# Patient Record
Sex: Male | Born: 1935
Health system: Southern US, Community
[De-identification: ages and names within clinical notes are randomized; demographics above are authoritative.]

## PROBLEM LIST (undated history)

## (undated) DIAGNOSIS — E669 Obesity, unspecified: Secondary | ICD-10-CM

## (undated) DIAGNOSIS — H9190 Unspecified hearing loss, unspecified ear: Secondary | ICD-10-CM

## (undated) DIAGNOSIS — F418 Other specified anxiety disorders: Secondary | ICD-10-CM

## (undated) DIAGNOSIS — R001 Bradycardia, unspecified: Secondary | ICD-10-CM

## (undated) DIAGNOSIS — F32A Depression, unspecified: Secondary | ICD-10-CM

## (undated) DIAGNOSIS — R079 Chest pain, unspecified: Secondary | ICD-10-CM

## (undated) DIAGNOSIS — K219 Gastro-esophageal reflux disease without esophagitis: Secondary | ICD-10-CM

## (undated) DIAGNOSIS — J302 Other seasonal allergic rhinitis: Secondary | ICD-10-CM

## (undated) DIAGNOSIS — I5030 Unspecified diastolic (congestive) heart failure: Secondary | ICD-10-CM

## (undated) DIAGNOSIS — J45909 Unspecified asthma, uncomplicated: Secondary | ICD-10-CM

## (undated) DIAGNOSIS — H409 Unspecified glaucoma: Secondary | ICD-10-CM

## (undated) DIAGNOSIS — K635 Polyp of colon: Secondary | ICD-10-CM

## (undated) DIAGNOSIS — E785 Hyperlipidemia, unspecified: Secondary | ICD-10-CM

## (undated) DIAGNOSIS — F959 Tic disorder, unspecified: Secondary | ICD-10-CM

## (undated) DIAGNOSIS — I351 Nonrheumatic aortic (valve) insufficiency: Secondary | ICD-10-CM

## (undated) DIAGNOSIS — F419 Anxiety disorder, unspecified: Secondary | ICD-10-CM

## (undated) DIAGNOSIS — E7800 Pure hypercholesterolemia, unspecified: Secondary | ICD-10-CM

## (undated) DIAGNOSIS — F329 Major depressive disorder, single episode, unspecified: Secondary | ICD-10-CM

## (undated) DIAGNOSIS — F5105 Insomnia due to other mental disorder: Secondary | ICD-10-CM

## (undated) DIAGNOSIS — I519 Heart disease, unspecified: Secondary | ICD-10-CM

## (undated) HISTORY — DX: Chest pain, unspecified: R07.9

## (undated) HISTORY — DX: Heart disease, unspecified: I51.9

## (undated) HISTORY — PX: CATARACT EXTRACTION, BILATERAL: SHX1313

## (undated) HISTORY — DX: Unspecified asthma, uncomplicated: J45.909

## (undated) HISTORY — DX: Unspecified hearing loss, unspecified ear: H91.90

## (undated) HISTORY — DX: Bradycardia, unspecified: R00.1

## (undated) HISTORY — DX: Tic disorder, unspecified: F95.9

## (undated) HISTORY — DX: Unspecified diastolic (congestive) heart failure: I50.30

## (undated) HISTORY — DX: Depression, unspecified: F32.A

## (undated) HISTORY — DX: Unspecified glaucoma: H40.9

## (undated) HISTORY — PX: COLONOSCOPY: SHX174

## (undated) HISTORY — DX: Gastro-esophageal reflux disease without esophagitis: K21.9

## (undated) HISTORY — DX: Insomnia due to other mental disorder: F51.05

## (undated) HISTORY — DX: Obesity, unspecified: E66.9

## (undated) HISTORY — DX: Other specified anxiety disorders: F41.8

## (undated) HISTORY — DX: Nonrheumatic aortic (valve) insufficiency: I35.1

## (undated) HISTORY — PX: EYE SURGERY: SHX253

## (undated) HISTORY — DX: Hyperlipidemia, unspecified: E78.5

## (undated) HISTORY — DX: Pure hypercholesterolemia, unspecified: E78.00

## (undated) HISTORY — DX: Major depressive disorder, single episode, unspecified: F32.9

---

## 2001-10-21 ENCOUNTER — Encounter: Payer: Self-pay | Admitting: Family Medicine

## 2001-10-21 ENCOUNTER — Ambulatory Visit (HOSPITAL_COMMUNITY): Admission: RE | Admit: 2001-10-21 | Discharge: 2001-10-21 | Payer: Self-pay | Admitting: Family Medicine

## 2001-10-27 ENCOUNTER — Ambulatory Visit (HOSPITAL_COMMUNITY): Admission: RE | Admit: 2001-10-27 | Discharge: 2001-10-27 | Payer: Self-pay | Admitting: Family Medicine

## 2001-10-27 ENCOUNTER — Encounter: Payer: Self-pay | Admitting: Family Medicine

## 2003-06-02 ENCOUNTER — Ambulatory Visit (HOSPITAL_COMMUNITY): Admission: RE | Admit: 2003-06-02 | Discharge: 2003-06-02 | Payer: Self-pay | Admitting: Family Medicine

## 2004-02-21 ENCOUNTER — Ambulatory Visit: Payer: Self-pay | Admitting: Family Medicine

## 2004-03-01 ENCOUNTER — Ambulatory Visit: Payer: Self-pay | Admitting: Family Medicine

## 2004-03-06 ENCOUNTER — Ambulatory Visit (HOSPITAL_COMMUNITY): Admission: RE | Admit: 2004-03-06 | Discharge: 2004-03-06 | Payer: Self-pay | Admitting: Family Medicine

## 2004-04-09 HISTORY — PX: OTHER SURGICAL HISTORY: SHX169

## 2004-06-01 ENCOUNTER — Ambulatory Visit (HOSPITAL_BASED_OUTPATIENT_CLINIC_OR_DEPARTMENT_OTHER): Admission: RE | Admit: 2004-06-01 | Discharge: 2004-06-01 | Payer: Self-pay | Admitting: Orthopedic Surgery

## 2004-06-01 ENCOUNTER — Ambulatory Visit (HOSPITAL_COMMUNITY): Admission: RE | Admit: 2004-06-01 | Discharge: 2004-06-01 | Payer: Self-pay | Admitting: Orthopedic Surgery

## 2004-06-29 ENCOUNTER — Ambulatory Visit: Payer: Self-pay | Admitting: Family Medicine

## 2004-07-05 ENCOUNTER — Ambulatory Visit (HOSPITAL_COMMUNITY): Admission: RE | Admit: 2004-07-05 | Discharge: 2004-07-05 | Payer: Self-pay | Admitting: Family Medicine

## 2004-10-30 ENCOUNTER — Ambulatory Visit: Payer: Self-pay | Admitting: Family Medicine

## 2004-12-29 ENCOUNTER — Ambulatory Visit: Payer: Self-pay | Admitting: Family Medicine

## 2005-02-19 ENCOUNTER — Ambulatory Visit: Payer: Self-pay | Admitting: Family Medicine

## 2005-03-06 ENCOUNTER — Ambulatory Visit: Payer: Self-pay | Admitting: Family Medicine

## 2005-05-09 ENCOUNTER — Ambulatory Visit: Payer: Self-pay | Admitting: Family Medicine

## 2005-08-22 ENCOUNTER — Ambulatory Visit: Payer: Self-pay | Admitting: Family Medicine

## 2005-09-18 ENCOUNTER — Ambulatory Visit (HOSPITAL_COMMUNITY): Admission: RE | Admit: 2005-09-18 | Discharge: 2005-09-18 | Payer: Self-pay | Admitting: Internal Medicine

## 2005-09-18 ENCOUNTER — Ambulatory Visit: Payer: Self-pay | Admitting: Internal Medicine

## 2005-11-29 ENCOUNTER — Ambulatory Visit (HOSPITAL_COMMUNITY): Admission: RE | Admit: 2005-11-29 | Discharge: 2005-11-29 | Payer: Self-pay | Admitting: Family Medicine

## 2006-01-07 ENCOUNTER — Ambulatory Visit: Payer: Self-pay | Admitting: Family Medicine

## 2006-01-08 ENCOUNTER — Ambulatory Visit: Payer: Self-pay | Admitting: Family Medicine

## 2006-02-26 ENCOUNTER — Ambulatory Visit: Payer: Self-pay | Admitting: Family Medicine

## 2006-05-09 ENCOUNTER — Encounter: Payer: Self-pay | Admitting: Family Medicine

## 2006-05-09 LAB — CONVERTED CEMR LAB
ALT: 18 units/L (ref 0–53)
AST: 20 units/L (ref 0–37)
Albumin: 4.3 g/dL (ref 3.5–5.2)
Cholesterol: 139 mg/dL (ref 0–200)
HDL: 40 mg/dL (ref >39)
Total CHOL/HDL Ratio: 3.5
Total Protein: 7.1 g/dL (ref 6.0–8.3)
Triglycerides: 68 mg/dL (ref <150)
VLDL: 14 mg/dL (ref 0–40)

## 2006-05-13 ENCOUNTER — Ambulatory Visit: Payer: Self-pay | Admitting: Family Medicine

## 2006-09-03 ENCOUNTER — Encounter: Payer: Self-pay | Admitting: Family Medicine

## 2006-09-03 LAB — CONVERTED CEMR LAB
AST: 20 units/L (ref 0–37)
Albumin: 4.7 g/dL (ref 3.5–5.2)
Cholesterol: 152 mg/dL (ref 0–200)
HDL: 42 mg/dL (ref >39)
LDL Cholesterol: 92 mg/dL (ref 0–99)
Total Bilirubin: 0.9 mg/dL (ref 0.3–1.2)
Total CHOL/HDL Ratio: 3.6
VLDL: 18 mg/dL (ref 0–40)

## 2006-09-11 ENCOUNTER — Ambulatory Visit: Payer: Self-pay | Admitting: Family Medicine

## 2006-12-30 ENCOUNTER — Encounter: Payer: Self-pay | Admitting: Family Medicine

## 2006-12-30 LAB — CONVERTED CEMR LAB
Bilirubin, Direct: 0.2 mg/dL (ref 0.0–0.3)
CO2: 23 meq/L (ref 19–32)
Calcium: 9.3 mg/dL (ref 8.4–10.5)
Eosinophils Relative: 4 % (ref 0–5)
Glucose, Bld: 105 mg/dL — ABNORMAL HIGH (ref 70–99)
HCT: 40.9 % (ref 39.0–52.0)
LDL Cholesterol: 75 mg/dL (ref 0–99)
Lymphocytes Relative: 37 % (ref 12–46)
Lymphs Abs: 1.9 10*3/uL (ref 0.7–3.3)
PSA: 0.1 ng/mL (ref 0.10–4.00)
Platelets: 276 10*3/uL (ref 150–400)
Potassium: 4.1 meq/L (ref 3.5–5.3)
Sodium: 143 meq/L (ref 135–145)
Total Bilirubin: 0.8 mg/dL (ref 0.3–1.2)
Total CHOL/HDL Ratio: 3.2
VLDL: 14 mg/dL (ref 0–40)
WBC: 5.3 10*3/uL (ref 4.0–10.5)

## 2007-01-06 ENCOUNTER — Ambulatory Visit: Payer: Self-pay | Admitting: Family Medicine

## 2007-01-17 ENCOUNTER — Ambulatory Visit: Payer: Self-pay | Admitting: Internal Medicine

## 2007-01-20 ENCOUNTER — Ambulatory Visit (HOSPITAL_COMMUNITY): Admission: RE | Admit: 2007-01-20 | Discharge: 2007-01-20 | Payer: Self-pay | Admitting: Internal Medicine

## 2007-01-22 ENCOUNTER — Ambulatory Visit: Payer: Self-pay | Admitting: Cardiology

## 2007-05-07 ENCOUNTER — Encounter: Payer: Self-pay | Admitting: Family Medicine

## 2007-05-07 LAB — CONVERTED CEMR LAB
Albumin: 4.3 g/dL (ref 3.5–5.2)
CO2: 25 meq/L (ref 19–32)
Chloride: 108 meq/L (ref 96–112)
HDL: 43 mg/dL (ref >39)
LDL Cholesterol: 75 mg/dL (ref 0–99)
Potassium: 4 meq/L (ref 3.5–5.3)
Sodium: 142 meq/L (ref 135–145)
Total Bilirubin: 0.8 mg/dL (ref 0.3–1.2)
Total CHOL/HDL Ratio: 3

## 2007-05-08 ENCOUNTER — Ambulatory Visit: Payer: Self-pay | Admitting: Family Medicine

## 2007-09-02 ENCOUNTER — Encounter: Payer: Self-pay | Admitting: Family Medicine

## 2007-09-02 LAB — CONVERTED CEMR LAB
AST: 23 units/L (ref 0–37)
Albumin: 4.2 g/dL (ref 3.5–5.2)
Bilirubin, Direct: 0.1 mg/dL (ref 0.0–0.3)
CO2: 24 meq/L (ref 19–32)
Calcium: 9.1 mg/dL (ref 8.4–10.5)
Chloride: 109 meq/L (ref 96–112)
Eosinophils Relative: 5 % (ref 0–5)
Glucose, Bld: 99 mg/dL (ref 70–99)
HCT: 41.6 % (ref 39.0–52.0)
Hemoglobin: 13 g/dL (ref 13.0–17.0)
LDL Cholesterol: 113 mg/dL — ABNORMAL HIGH (ref 0–99)
Lymphocytes Relative: 38 % (ref 12–46)
Lymphs Abs: 2.2 10*3/uL (ref 0.7–4.0)
Monocytes Absolute: 0.5 10*3/uL (ref 0.1–1.0)
Neutro Abs: 2.7 10*3/uL (ref 1.7–7.7)
Platelets: 283 10*3/uL (ref 150–400)
Sodium: 145 meq/L (ref 135–145)
TSH: 1.438 microintl units/mL (ref 0.350–5.50)
Total Bilirubin: 0.6 mg/dL (ref 0.3–1.2)
Total CHOL/HDL Ratio: 3.5
VLDL: 14 mg/dL (ref 0–40)
WBC: 5.7 10*3/uL (ref 4.0–10.5)

## 2007-09-08 DIAGNOSIS — E785 Hyperlipidemia, unspecified: Secondary | ICD-10-CM

## 2007-09-08 DIAGNOSIS — E669 Obesity, unspecified: Secondary | ICD-10-CM

## 2007-09-08 DIAGNOSIS — I498 Other specified cardiac arrhythmias: Secondary | ICD-10-CM

## 2007-09-10 ENCOUNTER — Ambulatory Visit: Payer: Self-pay | Admitting: Family Medicine

## 2007-12-08 ENCOUNTER — Encounter: Payer: Self-pay | Admitting: Family Medicine

## 2007-12-08 LAB — CONVERTED CEMR LAB
Albumin: 4.3 g/dL (ref 3.5–5.2)
CO2: 23 meq/L (ref 19–32)
Chloride: 106 meq/L (ref 96–112)
HDL: 45 mg/dL (ref >39)
LDL Cholesterol: 82 mg/dL (ref 0–99)
PSA: 0.1 ng/mL (ref 0.10–4.00)
Potassium: 4.4 meq/L (ref 3.5–5.3)
Sodium: 142 meq/L (ref 135–145)
Total Bilirubin: 0.7 mg/dL (ref 0.3–1.2)
Total CHOL/HDL Ratio: 3.1
Total Protein: 7.5 g/dL (ref 6.0–8.3)
Triglycerides: 57 mg/dL (ref <150)
VLDL: 11 mg/dL (ref 0–40)

## 2007-12-23 ENCOUNTER — Ambulatory Visit: Payer: Self-pay | Admitting: Family Medicine

## 2007-12-23 DIAGNOSIS — M899 Disorder of bone, unspecified: Secondary | ICD-10-CM | POA: Insufficient documentation

## 2007-12-23 DIAGNOSIS — H9190 Unspecified hearing loss, unspecified ear: Secondary | ICD-10-CM | POA: Insufficient documentation

## 2007-12-23 DIAGNOSIS — B353 Tinea pedis: Secondary | ICD-10-CM

## 2007-12-23 DIAGNOSIS — M949 Disorder of cartilage, unspecified: Secondary | ICD-10-CM

## 2007-12-26 ENCOUNTER — Encounter: Payer: Self-pay | Admitting: Family Medicine

## 2007-12-26 ENCOUNTER — Ambulatory Visit (HOSPITAL_COMMUNITY): Admission: RE | Admit: 2007-12-26 | Discharge: 2007-12-26 | Payer: Self-pay | Admitting: Family Medicine

## 2008-01-01 ENCOUNTER — Ambulatory Visit: Payer: Self-pay | Admitting: Cardiology

## 2008-01-05 ENCOUNTER — Encounter: Payer: Self-pay | Admitting: Family Medicine

## 2008-01-27 ENCOUNTER — Encounter: Payer: Self-pay | Admitting: Family Medicine

## 2008-03-11 ENCOUNTER — Encounter: Payer: Self-pay | Admitting: Family Medicine

## 2008-03-11 ENCOUNTER — Encounter (INDEPENDENT_AMBULATORY_CARE_PROVIDER_SITE_OTHER): Payer: Self-pay

## 2008-03-31 ENCOUNTER — Encounter: Payer: Self-pay | Admitting: Family Medicine

## 2008-03-31 LAB — CONVERTED CEMR LAB
ALT: 21 units/L (ref 0–53)
Alkaline Phosphatase: 50 units/L (ref 39–117)
Bilirubin, Direct: 0.2 mg/dL (ref 0.0–0.3)
Cholesterol: 142 mg/dL (ref 0–200)
Indirect Bilirubin: 0.6 mg/dL (ref 0.0–0.9)
Total Protein: 7.2 g/dL (ref 6.0–8.3)
Triglycerides: 46 mg/dL (ref <150)
VLDL: 9 mg/dL (ref 0–40)

## 2008-04-16 ENCOUNTER — Ambulatory Visit: Payer: Self-pay | Admitting: Family Medicine

## 2008-08-25 LAB — CONVERTED CEMR LAB
BUN: 11 mg/dL (ref 6–23)
Bilirubin, Direct: 0.2 mg/dL (ref 0.0–0.3)
CO2: 25 meq/L (ref 19–32)
Calcium: 9.4 mg/dL (ref 8.4–10.5)
Cholesterol: 130 mg/dL (ref 0–200)
Glucose, Bld: 105 mg/dL — ABNORMAL HIGH (ref 70–99)
Indirect Bilirubin: 0.5 mg/dL (ref 0.0–0.9)
Potassium: 4.3 meq/L (ref 3.5–5.3)
Sodium: 143 meq/L (ref 135–145)
Total Bilirubin: 0.7 mg/dL (ref 0.3–1.2)
Total CHOL/HDL Ratio: 3
VLDL: 9 mg/dL (ref 0–40)

## 2008-09-03 ENCOUNTER — Ambulatory Visit: Payer: Self-pay | Admitting: Family Medicine

## 2008-10-28 ENCOUNTER — Telehealth: Payer: Self-pay | Admitting: Family Medicine

## 2008-11-01 LAB — CONVERTED CEMR LAB
Basophils Relative: 2 % — ABNORMAL HIGH (ref 0–1)
Eosinophils Absolute: 0.2 10*3/uL (ref 0.0–0.7)
Eosinophils Relative: 4 % (ref 0–5)
HCT: 41.9 % (ref 39.0–52.0)
Lymphs Abs: 2 10*3/uL (ref 0.7–4.0)
MCHC: 31.3 g/dL (ref 30.0–36.0)
MCV: 93.7 fL (ref 78.0–100.0)
Monocytes Absolute: 0.5 10*3/uL (ref 0.1–1.0)
Monocytes Relative: 8 % (ref 3–12)
RBC: 4.47 M/uL (ref 4.22–5.81)
Vit D, 25-Hydroxy: 31 ng/mL (ref 30–89)
WBC: 5.5 10*3/uL (ref 4.0–10.5)

## 2008-11-04 ENCOUNTER — Telehealth: Payer: Self-pay | Admitting: Family Medicine

## 2008-11-29 ENCOUNTER — Ambulatory Visit: Payer: Self-pay | Admitting: Family Medicine

## 2009-01-04 ENCOUNTER — Ambulatory Visit: Payer: Self-pay | Admitting: Family Medicine

## 2009-01-26 ENCOUNTER — Encounter: Payer: Self-pay | Admitting: Family Medicine

## 2009-02-07 ENCOUNTER — Encounter: Payer: Self-pay | Admitting: Family Medicine

## 2009-03-29 ENCOUNTER — Telehealth: Payer: Self-pay | Admitting: Family Medicine

## 2009-04-06 ENCOUNTER — Ambulatory Visit: Payer: Self-pay | Admitting: Family Medicine

## 2009-04-06 ENCOUNTER — Ambulatory Visit (HOSPITAL_COMMUNITY): Admission: RE | Admit: 2009-04-06 | Discharge: 2009-04-06 | Payer: Self-pay | Admitting: Family Medicine

## 2009-04-07 ENCOUNTER — Encounter (INDEPENDENT_AMBULATORY_CARE_PROVIDER_SITE_OTHER): Payer: Self-pay

## 2009-04-07 ENCOUNTER — Ambulatory Visit: Payer: Self-pay | Admitting: Family Medicine

## 2009-04-07 DIAGNOSIS — R5383 Other fatigue: Secondary | ICD-10-CM

## 2009-04-07 DIAGNOSIS — R5381 Other malaise: Secondary | ICD-10-CM

## 2009-04-07 LAB — CONVERTED CEMR LAB
Eosinophils Relative: 3 % (ref 0–5)
HCT: 36.4 % — ABNORMAL LOW (ref 39.0–52.0)
Hemoglobin: 12 g/dL — ABNORMAL LOW (ref 13.0–17.0)
Lymphocytes Relative: 21 % (ref 12–46)
Lymphs Abs: 2.1 10*3/uL (ref 0.7–4.0)
Monocytes Relative: 13 % — ABNORMAL HIGH (ref 3–12)
Platelets: 454 10*3/uL — ABNORMAL HIGH (ref 150–400)
RBC: 3.92 M/uL — ABNORMAL LOW (ref 4.22–5.81)
WBC: 10.2 10*3/uL (ref 4.0–10.5)

## 2009-04-25 ENCOUNTER — Ambulatory Visit (HOSPITAL_COMMUNITY): Admission: RE | Admit: 2009-04-25 | Discharge: 2009-04-25 | Payer: Self-pay | Admitting: Family Medicine

## 2009-04-25 ENCOUNTER — Ambulatory Visit: Payer: Self-pay | Admitting: Family Medicine

## 2009-04-27 LAB — CONVERTED CEMR LAB
ALT: 17 units/L (ref 0–53)
Bilirubin, Direct: 0.1 mg/dL (ref 0.0–0.3)
Chloride: 105 meq/L (ref 96–112)
Cholesterol: 244 mg/dL — ABNORMAL HIGH (ref 0–200)
Glucose, Bld: 93 mg/dL (ref 70–99)
Potassium: 4.2 meq/L (ref 3.5–5.3)
Sodium: 139 meq/L (ref 135–145)
Total CHOL/HDL Ratio: 5.3
Total Protein: 7.3 g/dL (ref 6.0–8.3)
Triglycerides: 102 mg/dL (ref <150)
VLDL: 20 mg/dL (ref 0–40)
Vit D, 25-Hydroxy: 22 ng/mL — ABNORMAL LOW (ref 30–89)

## 2009-05-06 ENCOUNTER — Telehealth: Payer: Self-pay | Admitting: Family Medicine

## 2009-08-24 ENCOUNTER — Ambulatory Visit: Payer: Self-pay | Admitting: Family Medicine

## 2009-08-25 LAB — CONVERTED CEMR LAB
ALT: 16 units/L (ref 0–53)
AST: 23 units/L (ref 0–37)
Albumin: 4.3 g/dL (ref 3.5–5.2)
Calcium: 9.1 mg/dL (ref 8.4–10.5)
Cholesterol: 148 mg/dL (ref 0–200)
HDL: 42 mg/dL (ref >39)
Sodium: 141 meq/L (ref 135–145)
Total CHOL/HDL Ratio: 3.5
Total Protein: 7 g/dL (ref 6.0–8.3)
Vit D, 25-Hydroxy: 31 ng/mL (ref 30–89)

## 2009-09-06 ENCOUNTER — Ambulatory Visit: Payer: Self-pay | Admitting: Family Medicine

## 2009-10-19 ENCOUNTER — Encounter: Payer: Self-pay | Admitting: Family Medicine

## 2009-10-20 ENCOUNTER — Ambulatory Visit: Payer: Self-pay | Admitting: Family Medicine

## 2009-10-24 ENCOUNTER — Encounter: Payer: Self-pay | Admitting: Family Medicine

## 2009-10-26 ENCOUNTER — Ambulatory Visit: Payer: Self-pay | Admitting: Cardiovascular Disease

## 2009-10-26 DIAGNOSIS — F17200 Nicotine dependence, unspecified, uncomplicated: Secondary | ICD-10-CM

## 2009-11-28 ENCOUNTER — Ambulatory Visit: Payer: Self-pay | Admitting: Cardiovascular Disease

## 2009-12-16 LAB — CONVERTED CEMR LAB
AST: 20 units/L (ref 0–37)
Albumin: 4.3 g/dL (ref 3.5–5.2)
Alkaline Phosphatase: 42 units/L (ref 39–117)
CO2: 24 meq/L (ref 19–32)
Calcium: 9.5 mg/dL (ref 8.4–10.5)
Creatinine, Ser: 0.75 mg/dL (ref 0.40–1.50)
HDL: 43 mg/dL (ref >39)
Total Bilirubin: 0.8 mg/dL (ref 0.3–1.2)
Triglycerides: 56 mg/dL (ref <150)
Vit D, 25-Hydroxy: 34 ng/mL (ref 30–89)

## 2009-12-23 ENCOUNTER — Ambulatory Visit: Payer: Self-pay | Admitting: Family Medicine

## 2010-02-20 ENCOUNTER — Telehealth: Payer: Self-pay | Admitting: Family Medicine

## 2010-04-13 ENCOUNTER — Telehealth: Payer: Self-pay | Admitting: Family Medicine

## 2010-04-17 ENCOUNTER — Encounter: Payer: Self-pay | Admitting: Family Medicine

## 2010-04-17 ENCOUNTER — Ambulatory Visit
Admission: RE | Admit: 2010-04-17 | Discharge: 2010-04-17 | Payer: Self-pay | Source: Home / Self Care | Attending: Family Medicine | Admitting: Family Medicine

## 2010-04-19 LAB — CONVERTED CEMR LAB
AST: 20 units/L (ref 0–37)
Alkaline Phosphatase: 40 units/L (ref 39–117)
BUN: 16 mg/dL (ref 6–23)
Basophils Absolute: 0.1 10*3/uL (ref 0.0–0.1)
Bilirubin, Direct: 0.1 mg/dL (ref 0.0–0.3)
CO2: 28 meq/L (ref 19–32)
Calcium: 9.2 mg/dL (ref 8.4–10.5)
Creatinine, Ser: 0.78 mg/dL (ref 0.40–1.50)
Eosinophils Relative: 1 % (ref 0–5)
Glucose, Bld: 80 mg/dL (ref 70–99)
HCT: 42.7 % (ref 39.0–52.0)
Hemoglobin: 13.7 g/dL (ref 13.0–17.0)
Indirect Bilirubin: 0.5 mg/dL (ref 0.0–0.9)
Lymphocytes Relative: 29 % (ref 12–46)
Lymphs Abs: 3 10*3/uL (ref 0.7–4.0)
Monocytes Absolute: 0.7 10*3/uL (ref 0.1–1.0)
Monocytes Relative: 7 % (ref 3–12)
RDW: 12.4 % (ref 11.5–15.5)
Total Bilirubin: 0.6 mg/dL (ref 0.3–1.2)

## 2010-04-21 ENCOUNTER — Ambulatory Visit: Admit: 2010-04-21 | Payer: Self-pay | Admitting: Family Medicine

## 2010-05-03 ENCOUNTER — Telehealth: Payer: Self-pay | Admitting: Family Medicine

## 2010-05-04 ENCOUNTER — Ambulatory Visit (HOSPITAL_COMMUNITY)
Admission: RE | Admit: 2010-05-04 | Discharge: 2010-05-04 | Payer: Self-pay | Source: Home / Self Care | Attending: Family Medicine | Admitting: Family Medicine

## 2010-05-08 ENCOUNTER — Encounter: Payer: Self-pay | Admitting: Family Medicine

## 2010-05-09 ENCOUNTER — Encounter: Payer: Self-pay | Admitting: Family Medicine

## 2010-05-09 ENCOUNTER — Telehealth: Payer: Self-pay | Admitting: Family Medicine

## 2010-05-11 NOTE — Assessment & Plan Note (Signed)
Summary: sick- room 3   Vital Signs:  Patient profile:   75 year old male Height:      68 inches Weight:      242.25 pounds BMI:     36.97 O2 Sat:      97 % on Room air Pulse rate:   63 / minute Resp:     16 per minute BP sitting:   120 / 64  (left arm)  Vitals Entered By: Adella Hare LPN (Sep 06, 2009 9:10 AM) CC: cough, congestion, stuffy head Is Patient Diabetic? No Pain Assessment Patient in pain? no      Comments did not bring meds to ov   Primary Provider:  [name] MD  CC:  cough, congestion, and stuffy head.  History of Present Illness: Pt is here today with c/o sinus congestion, post nasal drainage and productive cough x 3-4 days.  The phlegm he coughs up is clear.  He feels like he did before he got pneumonia this last december.  He has been taking over the counter cold meds & no help.  He denies fever, chills or difficulty breathing.   Allergies (verified): No Known Drug Allergies  Past History:  Past medical history reviewed for relevance to current acute and chronic problems.  Past Medical History: Reviewed history from 12/23/2007 and no changes required.   BRADYCARDIA, CHRONIC (ICD-427.89) OBESITY (ICD-278.00) HYPERLIPIDEMIA (ICD-272.4) FACIAL TIC  Review of Systems General:  Complains of fever; denies chills; FELT FEVERISH YESTERDAY. ENT:  Complains of nasal congestion, postnasal drainage, and sinus pressure; denies earache and sore throat; THROAT FEELS SCRATCHY BUT NOT SORE. CV:  Denies chest pain or discomfort and palpitations. Resp:  Complains of cough, shortness of breath, and sputum productive. Allergy:  Complains of seasonal allergies; denies itching eyes and sneezing.  Physical Exam  General:  Well-developed,well-nourished,in no acute distress; alert,appropriate and cooperative throughout examination Head:  Normocephalic and atraumatic without obvious abnormalities. No apparent alopecia or balding. Ears:  External ear exam  shows no significant lesions or deformities.  Otoscopic examination reveals clear canals, tympanic membranes are intact bilaterally without bulging, retraction, inflammation or discharge. Hearing is grossly normal bilaterally. Nose:  mild turb swelling bilat, with clear mucus, and sl bluish colorationno sinus percussion tenderness.   Mouth:  Oral mucosa and oropharynx without lesions or exudates.  upper plate noted Neck:  No deformities, masses, or tenderness noted. Lungs:  Normal respiratory effort, chest expands symmetrically. Lungs are clear to auscultation, no crackles or wheezes. Heart:  Normal rate and regular rhythm. S1 and S2 normal without gallop, murmur, click, rub or other extra sounds. Cervical Nodes:  No lymphadenopathy noted Psych:  Cognition and judgment appear intact. Alert and cooperative with normal attention span and concentration. No apparent delusions, illusions, hallucinations   Impression & Recommendations:  Problem # 1:  SINUSITIS, ACUTE (ICD-461.9) Assessment New  His updated medication list for this problem includes:    Amoxicillin 875 Mg Tabs (Amoxicillin) .Marland Kitchen... Take 1 two times a day for 10 days    Benzonatate 100 Mg Caps (Benzonatate) .Marland Kitchen... Take 1 capsule every 8 hrs as needed for cough  Orders: Depo- Medrol 80mg  (J1040) Admin of Therapeutic Inj  intramuscular or subcutaneous (56213)  Problem # 2:  ALLERGIC RHINITIS CAUSE UNSPECIFIED (ICD-477.9) Assessment: Comment Only  Complete Medication List: 1)  Aspirin Ec 81 Mg Tbec (Aspirin) .... Take 1 tablet by mouth once a day 2)  Daily Vitamins Tabs (Multiple vitamin) .... Take 1 tablet by mouth once a  day 3)  Potassium 99 Mg Tabs (Potassium) .... Take 1 tablet by mouth once a day 4)  Fish Oil 1000 Mg Caps (Omega-3 fatty acids) .... Take 1 tablet by mouth two times a day 5)  Vitamin C 500 Mg Tabs (Ascorbic acid) .... Take 1 tablet by mouth two times a day 6)  Simvastatin 40 Mg Tabs (Simvastatin) .... One tab by  mouth qhs 7)  Vitamin D (ergocalciferol) 50000 Unit Caps (Ergocalciferol) .... One cap by mouth every week 8)  Amoxicillin 875 Mg Tabs (Amoxicillin) .... Take 1 two times a day for 10 days 9)  Benzonatate 100 Mg Caps (Benzonatate) .... Take 1 capsule every 8 hrs as needed for cough  Patient Instructions: 1)  Please schedule a follow-up appointment as needed. 2)  Get plenty of rest, drink lots of clear liquids, and use Tylenol or Ibuprofen for fever and comfort. Return in 7-10 days if you're not better:sooner if you're feeling worse. 3)  You may use over the counter Loratadine as needed for nasal congestion and allergies. 4)  I have prescribed an antibiotic for your sinus infection. 5)  I have also prescribed a cough capsule for you to use as needed. 6)  You have received a shot of Depo Medrol today to help with your congestion. Prescriptions: BENZONATATE 100 MG CAPS (BENZONATATE) take 1 capsule every 8 hrs as needed for cough  #30 x 0   Entered and Authorized by:   Esperanza Sheets PA   Signed by:   Esperanza Sheets PA on 09/06/2009   Method used:   [facility]ically to        [facility] [phone]* (retail)       [address]       [place], [place]  [address]       Ph: [phone] or [phone]       Fax: [phone]   RxID:   [id number] AMOXICILLIN 875 MG TABS (AMOXICILLIN) take 1 two times a day for 10 days  #20 x 0   Entered and Authorized by:   Esperanza Sheets PA   Signed by:   Esperanza Sheets PA on 09/06/2009   Method used:   [facility]ronically to        [facility] [phone]* (retail)       [address]       [place], [place]  [address]       Ph: [phone] or [phone]       Fax: [phone]   RxID:   [id number]    Medication Administration  Injection # 1:    Medication: Depo- Medrol 80mg     Diagnosis: SINUSITIS, ACUTE (ICD-461.9)    Route: IM    Site: L deltoid    Exp Date: 06/2011    Lot #: [id number]    Mfr: [employer]    Patient tolerated  injection without complications    Given by: Adella Hare LPN (Sep 06, 2009 10:55 AM)  Orders Added: 1)  Depo- Medrol 80mg  [J1040] 2)  Admin of Therapeutic Inj  intramuscular or subcutaneous [96372] 3)  Est. Patient Level IV [93810]

## 2010-05-11 NOTE — Letter (Signed)
Summary: Out of Work  [facility]  [address]   [place], [place] [address]   Phone: [phone]  Fax: [phone]    April 17, 2010   Employee:  PRENTIS LANGDON    To Whom It May Concern:   For Medical reasons, please excuse the above named employee from work for the following dates:  Start:   04/17/10  End:   04/24/10 to return with no restrictions  If you need additional information, please feel free to contact our office.         Sincerely,    Milus Mallick. Lodema Hong, MD

## 2010-05-11 NOTE — Progress Notes (Signed)
Summary: antibotics  Phone Note Call from Patient   Summary of Call: needs to get refills on medicines. States for the cold that he still has. [phone] Initial call taken by: Rudene Anda,  May 03, 2010 9:02 AM  Follow-up for Phone Call        called pt, left message Follow-up by: Everitt Amber LPN,  May 03, 2010 4:18 PM  Additional Follow-up for Phone Call Additional follow up Details #1::        Patient states he still has some congestion in his chest (mostly clear) and some wheezing still. Wanted to know if he needed refills on meds or something else Additional Follow-up by: Everitt Amber LPN,  May 03, 2010 4:35 PM    Additional Follow-up for Phone Call Additional follow up Details #2::    I recommend rept cxr if totally clear, and if sputum c/s is negative no antibiotics needs, in the interim wwhile tests coming back may [place] robitussin as needed Follow-up by: [name] MD,  May 03, 2010 6:44 PM  Additional Follow-up for Phone Call Additional follow up Details #3:: Details for Additional Follow-up Action Taken: Patient coming in to get xray and sputum lab order. Advised to use robitussin if normal  Additional Follow-up by: Everitt Amber LPN,  May 04, 2010 9:23 AM

## 2010-05-11 NOTE — Letter (Signed)
Summary: life line screening  life line screening   Imported By: Lind Guest 10/19/2009 08:01:01  _____________________________________________________________________  External Attachment:    Type:   Image     Comment:   External Document

## 2010-05-11 NOTE — Assessment & Plan Note (Signed)
Summary: office visit   Vital Signs:  Patient profile:   75 year old male Height:      68 inches Weight:      240.75 pounds BMI:     36.74 O2 Sat:      96 % Pulse rate:   57 / minute Pulse rhythm:   regular Resp:     16 per minute BP sitting:   110 / 70  (left arm) Cuff size:   large  Vitals Entered By: Everitt Amber LPN  Nutrition Counseling: Patient's BMI is greater than 25 and therefore counseled on weight management options. CC: Follow up chronic problems, gets cramps after walking    Primary Care Provider:  [name] MD  CC:  Follow up chronic problems and gets cramps after walking .  History of Present Illness: Reports  that he has been  doing well. He recently had cardiology eval. is pleased with this aND HAS A 6 MONTH F/U. hIS STUDY WAS NEGATIVE FOR ISCHEMIA. Denies recent fever or chills. Denies sinus pressure, nasal congestion , ear pain or sore throat. Denies chest congestion, or cough productive of sputum. Denies chest pain, palpitations, PND, orthopnea or leg swelling. Denies abdominal pain, nausea, vomitting, diarrhea or constipation. Denies change in bowel movements or bloody stool. Denies dysuria , frequency, incontinence or hesitancy. Denies  joint pain, swelling, or reduced mobility. Denies headaches, vertigo, seizures. Denies depression, anxiety or insomnia. Denies  rash, lesions, or itch. Terry Love continues to be diligent with exercise and healthy eating. He does c/o bilateral foot pain after walking on asphalt,in particular.     Current Medications (verified): 1)  Aspirin Ec 81 Mg  Tbec (Aspirin) .... Take 1 Tablet By Mouth Once A Day 2)  Daily Vitamins   Tabs (Multiple Vitamin) .... Take 1 Tablet By Mouth Once A Day 3)  Fish Oil 1000 Mg Caps (Omega-3 Fatty Acids) .... Take 1 Tablet By Mouth Two Times A Day 4)  Vitamin C 500 Mg Tabs (Ascorbic Acid) .... Take 1 Tablet By Mouth Two Times A Day 5)  Simvastatin 40 Mg Tabs (Simvastatin) .... One  Tab By Mouth Qhs 6)  Vitamin D (Ergocalciferol) 50000 Unit Caps (Ergocalciferol) .... One Cap By Mouth Every Week 7)  Cramp Tabs 325-25 Mg Tabs (Acetaminophen-Pamabrom) .... As Needed  Allergies (verified): No Known Drug Allergies  Review of Systems      See HPI Eyes:  Denies discharge, eye pain, and red eye. Endo:  Denies excessive thirst and excessive urination. Heme:  Denies abnormal bruising and bleeding. Allergy:  Denies hives or rash and itching eyes.  Physical Exam  General:  Well-developed,obese,in no acute distress; alert,appropriate and cooperative throughout examination HEENT: No facial asymmetry,  EOMI, No sinus tenderness, TM's Clear, oropharynx  pink and moist.   Chest: Clear to auscultation bilaterally.  CVS: S1, S2, No murmurs, No S3.   Abd: Soft, Nontender.  MS: Adequate ROM spine, hips, shoulders and knees.  Ext: No edema.   CNS: CN 2-12 intact, power tone and sensation normal throughout.   Skin: Intact, no visible lesions or rashes.  Psych: Good eye contact, normal affect.  Memory intact, not anxious or depressed appearing.    Impression & Recommendations:  Problem # 1:  SMOKELESS TOBACCO ABUSE (ICD-305.1) Assessment Unchanged  Encouraged smoking cessation and discussed different methods for smoking cessation.   Problem # 2:  OBESITY (ICD-278.00) Assessment: Improved  Ht: 68 (12/23/2009)   Wt: 240.75 (12/23/2009)   BMI: 36.74 (12/23/2009) therapeutic  lifestyle change discussed and encouraged  Problem # 3:  HYPERLIPIDEMIA (ICD-272.4) Assessment: Deteriorated  The following medications were removed from the medication list:    Simvastatin 40 Mg Tabs (Simvastatin) ..... One tab by mouth qhs His updated medication list for this problem includes:    Lipitor 40 Mg Tabs (Atorvastatin calcium) .Marland Kitchen... Take 1 tab by mouth at bedtime Low fat diet discussed and encouraged, and literature also given   Labs Reviewed: SGOT: 20 (12/14/2009)   SGPT: 15  (12/14/2009)  Prior 10 Yr Risk Heart Disease: 9 % (11/28/2009)   HDL:43 (12/14/2009), 42 (08/24/2009)  LDL:120 (12/14/2009), 93 (16/01/9603)  Chol:174 (12/14/2009), 148 (08/24/2009)  Trig:56 (12/14/2009), 65 (08/24/2009)  Complete Medication List: 1)  Aspirin Ec 81 Mg Tbec (Aspirin) .... Take 1 tablet by mouth once a day 2)  Daily Vitamins Tabs (Multiple vitamin) .... Take 1 tablet by mouth once a day 3)  Fish Oil 1000 Mg Caps (Omega-3 fatty acids) .... Take 1 tablet by mouth two times a day 4)  Vitamin C 500 Mg Tabs (Ascorbic acid) .... Take 1 tablet by mouth two times a day 5)  Vitamin D (ergocalciferol) 50000 Unit Caps (Ergocalciferol) .... One cap by mouth every week 6)  Cramp Tabs 325-25 Mg Tabs (Acetaminophen-pamabrom) .... As needed 7)  Lipitor 40 Mg Tabs (Atorvastatin calcium) .... Take 1 tab by mouth at bedtime 8)  Oscal 500/200 D-3 500-200 Mg-unit Tabs (Calcium-vitamin d) .... Take 1 tablet by mouth three times a day  Other Orders: Influenza Vaccine NON MCR (54098) Tdap => 26yrs IM (11914) Admin 1st Vaccine (78295) Admin 1st Vaccine [facility]) [phone])  Patient Instructions: 1)  Please schedule a follow-up appointment in 3  to 3.44months. 2)  It is important that you exercise regularly at least 20 minutes 5 times a week. If you develop chest pain, have severe difficulty breathing, or feel very tired , stop exercising immediately and seek medical attention. 3)  You need to lose weight. Consider a lower calorie diet and regular exercise.  4)  Stop nicotine productspls Prescriptions: OSCAL 500/200 D-3 500-200 MG-UNIT TABS (CALCIUM-VITAMIN D) Take 1 tablet by mouth three times a day  #270 x 3   Entered and Authorized by:   [name] MD   Signed by:   [name] MD on 12/23/2009   Method used:   Handwritten   RxID:   [id number] LIPITOR 40 MG TABS (ATORVASTATIN CALCIUM) Take 1 tab by mouth at bedtime  #90 x 3   Entered and Authorized by:   [name] MD    Signed by:   [name] MD on 12/23/2009   Method used:   Handwritten   RxID:   [id number]    Tetanus/Td Vaccine (to be given today)  Influenza Vaccine (to be given today)

## 2010-05-11 NOTE — Progress Notes (Signed)
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  please leave msg if we have these so he can get one. Initial call taken by: Curtis Sites,  February 20, 2010 2:22 PM  Follow-up for Phone Call        returned call, no answer Follow-up by: Adella Hare LPN,  February 20, 2010 2:46 PM  Additional Follow-up for Phone Call Additional follow up Details #1::        no we do not, pls let him know Additional Follow-up by: [name] MD,  February 22, 2010 6:14 AM    Additional Follow-up for Phone Call Additional follow up Details #2::    patient aware  Follow-up by: Lind Guest,  February 22, 2010 11:14 AM

## 2010-05-11 NOTE — Progress Notes (Signed)
Summary: med and pneu shot  Phone Note Call from Patient   Summary of Call: pt still has alot of congestion in chest. And would like to know if he could take a pneu. shot and also what to take for congestion. [phone]  Initial call taken by: Rudene Anda,  May 06, 2009 11:43 AM  Follow-up for Phone Call        call and erx tessalon perles 100mg  ,tidcap #30 only and  if he had one pneumonia vaccine over age65 then by the stdguidelines he does not need another one, pls let hm know.  Follow-up by: [name] MD,  May 06, 2009 3:01 PM  Additional Follow-up for Phone Call Additional follow up Details #1::        called patient, left message Additional Follow-up by: Everitt Amber,  May 06, 2009 3:46 PM    Additional Follow-up for Phone Call Additional follow up Details #2::    Patient aware Follow-up by: Everitt Amber,  May 06, 2009 4:17 PM  New/Updated Medications: TESSALON PERLES 100 MG CAPS (BENZONATATE) Take 1 tablet by mouth three times a day Prescriptions: TESSALON PERLES 100 MG CAPS (BENZONATATE) Take 1 tablet by mouth three times a day  #30 x 0   Entered by:   Everitt Amber   Authorized by:   [name] MD   Signed by:   Everitt Amber on 05/06/2009   Method used:   [facility] to        [facility] [phone][facility] (retail)       [address]       [place], [place]  [address]       Ph: [phone] or [phone]       Fax: [phone]   RxID:   [id number]

## 2010-05-11 NOTE — Letter (Signed)
Summary: medical release  medical release   Imported By: Lind Guest 04/17/2010 13:00:49  _____________________________________________________________________  External Attachment:    Type:   Image     Comment:   External Document

## 2010-05-11 NOTE — Assessment & Plan Note (Signed)
Summary: office visit   Vital Signs:  Patient profile:   75 year old male Height:      68 inches Weight:      242 pounds BMI:     36.93 O2 Sat:      96 % Pulse rate:   50 / minute Pulse rhythm:   regular Resp:     16 per minute BP sitting:   104 / 70  (left arm) Cuff size:   large  Vitals Entered By: Everitt Amber LPN (Aug 24, 2009 11:17 AM)  Nutrition Counseling: Patient's BMI is greater than 25 and therefore counseled on weight management options. CC: Follow up chronic problems   Primary Care Provider:  [name] MD  CC:  Follow up chronic problems.  History of Present Illness: Reports  that the has been  doing well. Denies recent fever or chills. Denies sinus pressure, reports increased nasal congestion ,and post nasal drainage, he denies ear pain or sore throat. Denies chest congestion, or cough productive of yellow sputum.He does occasionally have clear sputum, esp in the past 3 weeks Denies chest pain, palpitations, PND, orthopnea or leg swelling. Denies abdominal pain, nausea, vomitting, diarrhea or constipation. Denies change in bowel movements or bloody stool. Denies dysuria , frequency, incontinence or hesitancy. Denies  joint pain, swelling, or reduced mobility. Denies headaches, vertigo, seizures. Denies depression, anxiety or insomnia. Denies  rash, lesions, or itch. He i frustratedover his slow weight loss, but over the past 1 yr he ahas lost 10 pounds which is highly commendable. He is exercising at least 3 times weekly. He is here to review recent labs.     Current Medications (verified): 1)  Aspirin Ec 81 Mg  Tbec (Aspirin) .... Take 1 Tablet By Mouth Once A Day 2)  Daily Vitamins   Tabs (Multiple Vitamin) .... Take 1 Tablet By Mouth Once A Day 3)  Potassium 99 Mg Tabs (Potassium) .... Take 1 Tablet By Mouth Once A Day 4)  Fish Oil 1000 Mg Caps (Omega-3 Fatty Acids) .... Take 1 Tablet By Mouth Two Times A Day 5)  Vitamin C 500 Mg Tabs (Ascorbic  Acid) .... Take 1 Tablet By Mouth Two Times A Day 6)  Simvastatin 40 Mg Tabs (Simvastatin) .... One Tab By Mouth Qhs 7)  Vitamin D (Ergocalciferol) 50000 Unit Caps (Ergocalciferol) .... One Cap By Mouth Every Week  Allergies (verified): No Known Drug Allergies  Review of Systems      See HPI Eyes:  Denies blurring, discharge, and double vision. ENT:  Complains of decreased hearing. Resp:  Complains of cough. Endo:  Denies cold intolerance, excessive thirst, excessive urination, heat intolerance, and weight change. Heme:  Denies abnormal bruising and bleeding. Allergy:  Complains of seasonal allergies; denies hives or rash and itching eyes; increased nasal congeestion with cough and clear sputum occasionally in the last 3 weeks.  Physical Exam  General:  Well-developed,obese iin no acute distress; alert,appropriate and cooperative throughout examination HEENT: No facial asymmetry,  EOMI, No sinus tenderness, TM's Clear, oropharynx  pink and moist.   Chest: Clear to auscultation bilaterally.  CVS: S1, S2, No murmurs, No S3.   Abd: Soft, Nontender.  MS: Adequate ROM spine, hips, shoulders and knees.  Ext: No edema.   CNS: CN 2-12 intact, power tone and sensation normal throughout.   Skin: Intact, no visible lesions or rashes.  Psych: Good eye contact, normal affect.  Memory intact, not anxious or depressed appearing.    Impression & Recommendations:  Problem # 1:  HYPERLIPIDEMIA (ICD-272.4) Assessment Comment Only  His updated medication list for this problem includes:    Simvastatin 40 Mg Tabs (Simvastatin) ..... One tab by mouth qhs  Labs Reviewed: SGOT: 20 (04/26/2009)   SGPT: 17 (04/26/2009)   HDL:46 (04/26/2009), 43 (08/25/2008)  LDL:178 (04/26/2009), 78 (47/82/9562)  Chol:244 (04/26/2009), 130 (08/25/2008)  Trig:102 (04/26/2009), 46 (08/25/2008)  Problem # 2:  OBESITY (ICD-278.00) Assessment: Improved  Ht: 68 (08/24/2009)   Wt: 242 (08/24/2009)   BMI: 36.93  (08/24/2009)  Problem # 3:  BRADYCARDIA, CHRONIC (ICD-427.89) Assessment: Unchanged  His updated medication list for this problem includes:    Aspirin Ec 81 Mg Tbec (Aspirin) .Marland Kitchen... Take 1 tablet by mouth once a day  Complete Medication List: 1)  Aspirin Ec 81 Mg Tbec (Aspirin) .... Take 1 tablet by mouth once a day 2)  Daily Vitamins Tabs (Multiple vitamin) .... Take 1 tablet by mouth once a day 3)  Potassium 99 Mg Tabs (Potassium) .... Take 1 tablet by mouth once a day 4)  Fish Oil 1000 Mg Caps (Omega-3 fatty acids) .... Take 1 tablet by mouth two times a day 5)  Vitamin C 500 Mg Tabs (Ascorbic acid) .... Take 1 tablet by mouth two times a day 6)  Simvastatin 40 Mg Tabs (Simvastatin) .... One tab by mouth qhs 7)  Vitamin D (ergocalciferol) 50000 Unit Caps (Ergocalciferol) .... One cap by mouth every week  Other Orders: T-Basic Metabolic Panel [phone]) T-Lipid Profile [phone]) T-Hepatic Function [phone]) T-Vitamin D (25-Hydroxy) [phone]) T-PSA [phone])  Patient Instructions: 1)  Please schedule a follow-up appointment in 4 months. 2)  It is important that you exercise regularly at least 20 minutes 5 times a week. If you develop chest pain, have severe difficulty breathing, or feel very tired , stop exercising immediately and seek medical attention. 3)  You need to lose weight. Consider a lower calorie diet and regular exercise.  4)  BMP prior to visit, ICD-9: 5)  Hepatic Panel prior to visit, ICD-9:   FASTING IN 4 MONTHS 6)  Lipid Panel prior to visit, ICD-9: 7)  vit D 8)  pSA

## 2010-05-11 NOTE — Assessment & Plan Note (Signed)
Summary: ov   Vital Signs:  Patient profile:   75 year old male Height:      68 inches Weight:      241.75 pounds BMI:     36.89 O2 Sat:      97 % on Room air Pulse rate:   57 / minute Pulse rhythm:   regular Resp:     16 per minute BP sitting:   90 / 62  (left arm)  Vitals Entered By: Everitt Amber LPN (October 20, 2009 2:33 PM)  Nutrition Counseling: Patient's BMI is greater than 25 and therefore counseled on weight management options.  O2 Flow:  Room air CC: follow-up visit Is Patient Diabetic? No Pain Assessment Patient in pain? no      Comments did not bring meds to ov   Primary Care Provider:  [name] MD  CC:  follow-up visit.  History of Present Illness: Reports  that he has been doing fairly well. He has been exrcising on avg 3 days per week, and he continues to eat healthi;ly in an effort to improve his health. He recently had screening tests for  for cardiovascular disease, and he comes in to discuss them specifically. His only abnormalities are his BMI and and a low hDL. He does have chronic bradycardia, is very concerned about his heart and the need for a pacemaker which he "wants to avoid " if at all possible.His mother had a pacemaker. He has seen card in the past, reports some fatigue at times , and is happy to be re-evaluated by the cardiologist, he ahs not been for over 1 year.  Denies recent fever or chills. Denies sinus pressure, nasal congestion , ear pain or sore throat. Denies chest congestion, or cough productive of sputum. Denies chest pain, palpitations, PND, orthopnea or leg swelling. Denies abdominal pain, nausea, vomitting, diarrhea or constipation. Denies change in bowel movements or bloody stool. Denies dysuria , frequency, incontinence or hesitancy. Denies  joint pain, swelling, or reduced mobility. Denies headaches, vertigo, seizures. Denies depression, anxiety or insomnia. Denies  rash, lesions, or itch.     Allergies  (verified): No Known Drug Allergies  Review of Systems      See HPI Eyes:  Denies blurring and discharge. Neuro:  right eye twitch , improved. Heme:  Denies abnormal bruising and bleeding. Allergy:  Complains of seasonal allergies; denies hives or rash and itching eyes.  Physical Exam  General:  Well-developed,obese,in no acute distress; alert,appropriate and cooperative throughout examination HEENT: No facial asymmetry,  EOMI, No sinus tenderness, TM's Clear, oropharynx  pink and moist.   Chest: Clear to auscultation bilaterally.  CVS: S1, S2, No murmurs, No S3.   Abd: Soft, Nontender.  MS: Adequate ROM spine, hips, shoulders and knees.  Ext: No edema.   CNS: CN 2-12 intact, power tone and sensation normal throughout.   Skin: Intact, no visible lesions or rashes.  Psych: Good eye contact, normal affect.  Memory intact, not anxious or depressed appearing.    Impression & Recommendations:  Problem # 1:  BRADYCARDIA (ICD-427.89) Assessment Comment Only  His updated medication list for this problem includes:    Aspirin Ec 81 Mg Tbec (Aspirin) .Marland Kitchen... Take 1 tablet by mouth once a day  Orders: EKG w/ Interpretation (93000), question of previos ischemic event...abn Cardiology Referral (Cardiology)  Problem # 2:  ABNORMAL ELECTROCARDIOGRAM (ICD-794.31) Assessment: Comment Only  Orders: Cardiology Referral (Cardiology)  Problem # 3:  OBESITY (ICD-278.00) Assessment: Improved 10 poound weight loss  over 12 month period, pt encouraged to continue this  Problem # 4:  HYPERLIPIDEMIA (ICD-272.4) Assessment: Comment Only  His updated medication list for this problem includes:    Simvastatin 40 Mg Tabs (Simvastatin) ..... One tab by mouth qhs  Orders: T-Lipid Profile [id number]) T-Hepatic Function [phone])  Labs Reviewed: SGOT: 23 (08/24/2009)   SGPT: 16 (08/24/2009)   HDL:42 (08/24/2009), 46 (04/26/2009)  LDL:93 (08/24/2009), 178 (86/57/8469)  Chol:148 (08/24/2009),  244 (04/26/2009)  Trig:65 (08/24/2009), 102 (04/26/2009)  Complete Medication List: 1)  Aspirin Ec 81 Mg Tbec (Aspirin) .... Take 1 tablet by mouth once a day 2)  Daily Vitamins Tabs (Multiple vitamin) .... Take 1 tablet by mouth once a day 3)  Potassium 99 Mg Tabs (Potassium) .... Take 1 tablet by mouth once a day 4)  Fish Oil 1000 Mg Caps (Omega-3 fatty acids) .... Take 1 tablet by mouth two times a day 5)  Vitamin C 500 Mg Tabs (Ascorbic acid) .... Take 1 tablet by mouth two times a day 6)  Simvastatin 40 Mg Tabs (Simvastatin) .... One tab by mouth qhs 7)  Vitamin D (ergocalciferol) 50000 Unit Caps (Ergocalciferol) .... One cap by mouth every week  Other Orders: T- Hemoglobin A1C ([id number]) T-TSH ([id number]) T-PSA ([id number])  Patient Instructions: 1)  F/U end Sept. 2)  It is important that you exercise regularly at least 60 minutes 7 times a week. If you develop chest pain, have severe difficulty breathing, or feel very tired , stop exercising immediately and seek medical attention.this will improve your good cholesterol 3)  You need to lose weight. Consider a lower calorie diet and regular exercise. CONGRATS, keep it up! 4)  Hepatic Panel prior to visit, ICD-9: 5)  Lipid Panel prior to visit, ICD-9: 6)  TSH prior to visit, ICD-9:   fasting  7)  HbgA1C prior to visit, ICD-9: 8)  PSA prior to visit, ICD-9: 9)  You are being referred to the cardiologist because the EKG is abnormal and also to re-eval and f/u you slow heart rate

## 2010-05-11 NOTE — Assessment & Plan Note (Signed)
Summary: PNEM   Vital Signs:  Patient profile:   75 year old male Height:      68 inches Weight:      240 pounds BMI:     36.62 O2 Sat:      97 % Pulse rate:   60 / minute Pulse rhythm:   regular Resp:     16 per minute BP sitting:   108 / 66  (left arm) Cuff size:   large  Vitals Entered By: Everitt Amber LPN (April 17, 2010 8:50 AM)  Nutrition Counseling: Patient's BMI is greater than 25 and therefore counseled on weight management options. CC: went to the urgent care center for cough and congestion, had heavy wheezing and they gave him meds that seemed to be breaking it up some but its still there   Primary Care Provider:  [name] MD  CC:  went to the urgent care center for cough and congestion and had heavy wheezing and they gave him meds that seemed to be breaking it up some but its still there.  History of Present Illness: 1 wek h/o cough and chest congestion, seen at urgent care sputum was yellow, and is now clear, he  also had fever , he is currrently on a 10 day dose pack of prednisone  and levaquin 500mg  x 10 days. Still has symptoms, though improved, had pneumonia last yr and is concerned about recurrence.   Current Medications (verified): 1)  Aspirin Ec 81 Mg  Tbec (Aspirin) .... Take 1 Tablet By Mouth Once A Day 2)  Daily Vitamins   Tabs (Multiple Vitamin) .... Take 1 Tablet By Mouth Once A Day 3)  Fish Oil 1000 Mg Caps (Omega-3 Fatty Acids) .... Take 1 Tablet By Mouth Two Times A Day 4)  Vitamin C 500 Mg Tabs (Ascorbic Acid) .... Take 1 Tablet By Mouth Two Times A Day 5)  Vitamin D (Ergocalciferol) 50000 Unit Caps (Ergocalciferol) .... One Cap By Mouth Every Week 6)  Cramp Tabs 325-25 Mg Tabs (Acetaminophen-Pamabrom) .... As Needed 7)  Lipitor 40 Mg Tabs (Atorvastatin Calcium) .... Take 1 Tab By Mouth At Bedtime 8)  Oscal 500/200 D-3 500-200 Mg-Unit Tabs (Calcium-Vitamin D) .... Take 1 Tablet By Mouth Three Times A Day 9)  Prednisone (Pak) 5 Mg Tabs  (Prednisone) .... Uad 10)  Proair Hfa 108 (90 Base) Mcg/act Aers (Albuterol Sulfate) .... Inhale 2 Puffs By Mouth Every 4 Hrs As Needed 11)  Levofloxacin 500 Mg Tabs (Levofloxacin) .... Take 1 Tablet By Mouth Once A Day  Allergies (verified): No Known Drug Allergies  Review of Systems      See HPI General:  Complains of chills, fatigue, and malaise. Eyes:  Denies discharge, eye pain, and red eye. ENT:  Denies hoarseness, nasal congestion, postnasal drainage, sinus pressure, and sore throat. CV:  Complains of chest pain or discomfort and shortness of breath with exertion; denies palpitations and swelling of feet; symptom assocd with cough. GI:  Denies abdominal pain, constipation, nausea, and vomiting. GU:  Denies dysuria and urinary frequency. MS:  Denies joint pain, low back pain, mid back pain, and stiffness. Neuro:  Denies headaches and seizures.  Physical Exam  General:  Well-developed,obese,in no acute distress; alert,appropriate and cooperative throughout examination HEENT: No facial asymmetry,  EOMI, No sinus tenderness, TM's Clear, oropharynx  pink and moist.   Chest: decreased air entry, with bilateral crackles CVS: S1, S2, No murmurs, No S3.   Abd: Soft, Nontender.  MS: Adequate ROM  spine, hips, shoulders and knees.  Ext: No edema.   CNS: CN 2-12 intact, power tone and sensation normal throughout.   Skin: Intact, no visible lesions or rashes.  Psych: Good eye contact, normal affect.  Memory intact, not anxious or depressed appearing.    Impression & Recommendations:  Problem # 1:  ACUTE BRONCHITIS (ICD-466.0) Assessment Comment Only  His updated medication list for this problem includes:    Proair Hfa 108 (90 Base) Mcg/act Aers (Albuterol sulfate) ..... Inhale 2 puffs by mouth every 4 hrs as needed    Levofloxacin 500 Mg Tabs (Levofloxacin) .Marland Kitchen... Take 1 tablet by mouth once a day    Tessalon Perles 100 Mg Caps (Benzonatate) .Marland Kitchen... Take 1 capsule by mouth three times  a day pt to complete course of tratment, and work excuse x 1 week Orders: Rocephin  250mg  (Z6109) Admin of Therapeutic Inj  intramuscular or subcutaneous (60454) Albuterol Sulfate Sol 1mg  unit dose (U9811) Ipratropium inhalation sol. unit dose (B1478) Nebulizer Tx (29562)  Problem # 2:  HYPERLIPIDEMIA (ICD-272.4) Assessment: Comment Only  His updated medication list for this problem includes:    Lipitor 40 Mg Tabs (Atorvastatin calcium) .Marland Kitchen... Take 1 tab by mouth at bedtime Low fat dietdiscussed and encouraged  Orders: T-Lipid Profile ([id number]) T-Hepatic Function [phone])  Labs Reviewed: SGOT: 20 (12/14/2009)   SGPT: 15 (12/14/2009)  Prior 10 Yr Risk Heart Disease: 9 % (11/28/2009)   HDL:43 (12/14/2009), 42 (08/24/2009)  LDL:120 (12/14/2009), 93 (24/40/1027)  Chol:174 (12/14/2009), 148 (08/24/2009)  Trig:56 (12/14/2009), 65 (08/24/2009)  Problem # 3:  OBESITY (ICD-278.00) Assessment: Improved  Ht: 68 (04/17/2010)   Wt: 240 (04/17/2010)   BMI: 36.62 (04/17/2010) .tlc  Complete Medication List: 1)  Aspirin Ec 81 Mg Tbec (Aspirin) .... Take 1 tablet by mouth once a day 2)  Daily Vitamins Tabs (Multiple vitamin) .... Take 1 tablet by mouth once a day 3)  Fish Oil 1000 Mg Caps (Omega-3 fatty acids) .... Take 1 tablet by mouth two times a day 4)  Vitamin C 500 Mg Tabs (Ascorbic acid) .... Take 1 tablet by mouth two times a day 5)  Vitamin D (ergocalciferol) 50000 Unit Caps (Ergocalciferol) .... One cap by mouth every week 6)  Cramp Tabs 325-25 Mg Tabs (Acetaminophen-pamabrom) .... As needed 7)  Lipitor 40 Mg Tabs (Atorvastatin calcium) .... Take 1 tab by mouth at bedtime 8)  Oscal 500/200 D-3 500-200 Mg-unit Tabs (Calcium-vitamin d) .... Take 1 tablet by mouth three times a day 9)  Prednisone (pak) 5 Mg Tabs (Prednisone) .... Uad 10)  Proair Hfa 108 (90 Base) Mcg/act Aers (Albuterol sulfate) .... Inhale 2 puffs by mouth every 4 hrs as needed 11)  Levofloxacin 500 Mg Tabs  (Levofloxacin) .... Take 1 tablet by mouth once a day 12)  Tessalon Perles 100 Mg Caps (Benzonatate) .... Take 1 capsule by mouth three times a day  Other Orders: Medicare Electronic Prescription [phone]) T-CBC w/Diff [phone]) T-Basic Metabolic Panel [phone])  Patient Instructions: 1)  Please schedule a follow-up appointment in 1 month. 2)  You will receive rocephin and a breathing treatment in the office. 3)  CBC w/ Diff prior to visit, ICD-9:  , lipid and hepatice and chem 7 in am 4)  we will call for your x ray rept Prescriptions: TESSALON PERLES 100 MG CAPS (BENZONATATE) Take 1 capsule by mouth three times a day  #30 x 0   Entered and Authorized by:   [name] MD   Signed by:  [name] MD on 04/17/2010   Method used:   Electronically to        [facility] [phone]* (retail)       [address]       [place], [place]  [address]       Ph: [phone]       Fax: [phone]   RxID:   [id number]    Medication Administration  Injection # 1:    Medication: Rocephin  250mg     Diagnosis: ACUTE BRONCHITIS (ICD-466.0)    Route: IM    Site: LUOQ gluteus    Exp Date: 05/14    Lot #: [id number]    Mfr: novaplus    Comments: rocephin 500mg  given    Patient tolerated injection without complications    Given by: Adella Hare LPN (April 17, 2010 10:00 AM)  Medication # 1:    Medication: Albuterol Sulfate Sol 1mg  unit dose    Diagnosis: ACUTE BRONCHITIS (ICD-466.0)    Dose: 2.5mg     Route: inhaled    Exp Date: 08/12    Lot #: [id number]    Mfr: nephron pharm    Patient tolerated medication without complications    Given by: Adella Hare LPN (April 17, 2010 10:01 AM)  Medication # 2:    Medication: Ipratropium inhalation sol. unit dose    Diagnosis: ACUTE BRONCHITIS (ICD-466.0)    Dose: 0.5mg     Route: inhaled    Exp Date: 10/12    Lot #: [id number]    Mfr: nephron pharm    Patient tolerated medication without complications     Given by: Adella Hare LPN (April 17, 2010 10:02 AM)  Orders Added: 1)  Est. Patient Level IV [99214] 2)  Medicare Electronic Prescription [G8553] 3)  T-CBC w/Diff [[id number]] 4)  T-Lipid Profile [80061-22930] 5)  T-Hepatic Function [80076-22960] 6)  T-Basic Metabolic Panel [50093-81829] 7)  Rocephin  250mg  [J0696] 8)  Admin of Therapeutic Inj  intramuscular or subcutaneous [96372] 9)  Albuterol Sulfate Sol 1mg  unit dose [J7613] 10)  Ipratropium inhalation sol. unit dose [J7644] 11)  Nebulizer Tx [94640]     Medication Administration  Injection # 1:    Medication: Rocephin  250mg     Diagnosis: ACUTE BRONCHITIS (ICD-466.0)    Route: IM    Site: LUOQ gluteus    Exp Date: 05/14    Lot #: [id number]    Mfr: novaplus    Comments: rocephin 500mg  given    Patient tolerated injection without complications    Given by: Adella Hare LPN (April 17, 2010 10:00 AM)  Medication # 1:    Medication: Albuterol Sulfate Sol 1mg  unit dose    Diagnosis: ACUTE BRONCHITIS (ICD-466.0)    Dose: 2.5mg     Route: inhaled    Exp Date: 08/12    Lot #: [id number]    Mfr: nephron pharm    Patient tolerated medication without complications    Given by: Adella Hare LPN (April 17, 2010 10:01 AM)  Medication # 2:    Medication: Ipratropium inhalation sol. unit dose    Diagnosis: ACUTE BRONCHITIS (ICD-466.0)    Dose: 0.5mg     Route: inhaled    Exp Date: 10/12    Lot #: [id number]    Mfr: nephron pharm    Patient tolerated medication without complications    Given by: Adella Hare LPN (April 17, 2010 10:02 AM)  Orders Added: 1)  Est. Patient Level IV [04540] 2)  Medicare Electronic Prescription [G8553] 3)  T-CBC w/Diff [[id number]] 4)  T-Lipid Profile [80061-22930] 5)  T-Hepatic Function [80076-22960] 6)  T-Basic Metabolic Panel [95621-30865] 7)  Rocephin  250mg  [J0696] 8)  Admin of Therapeutic Inj  intramuscular or subcutaneous [96372] 9)  Albuterol Sulfate Sol 1mg  unit dose [J7613] 10)   Ipratropium inhalation sol. unit dose [J7644] 11)  Nebulizer Tx [phone]

## 2010-05-11 NOTE — Assessment & Plan Note (Signed)
Summary: physical   Vital Signs:  Patient profile:   75 year old male Height:      68 inches Weight:      245 pounds BMI:     37.39 O2 Sat:      97 % Pulse rate:   61 / minute Pulse rhythm:   regular Resp:     16 per minute BP sitting:   118 / 76  (left arm) Cuff size:   large  Vitals Entered By: Everitt Amber (April 25, 2009 2:33 PM)  Nutrition Counseling: Patient's BMI is greater than 25 and therefore counseled on weight management options. CC: CPE, still having a tightness in his throat  Vision Screening:Left eye w/o correction: 20 / 20 Right Eye w/o correction: 20 / 20 Both eyes w/o correction:  20/ 20  Color vision testing: normal      Vision Entered By: Everitt Amber (April 25, 2009 2:36 PM)   Primary Care Provider:  [name] MD  CC:  CPE and still having a tightness in his throat.  History of Present Illness: C/O persitent post nasal drainage with tiockle in throat, no fever or chills, sputm is ckear. pt reports mild bruising and has questions about supplements. He recently completed a 10 day course of antibiotic for pneumonia. He has not been as acive as  in the Summer months and has unfortunately gaINED EIGHT. hE PLANS TO ADDRESS THIS. hE DENIES URINARY FREQUENCY OR DYSURIA OR POOR STREAM. He denies depression, anxiety or insomnia.  Current Medications (verified): 1)  Lipitor 40 Mg  Tabs (Atorvastatin Calcium) .... Take 1 Tab By Mouth At Bedtime 2)  Aspirin Ec 81 Mg  Tbec (Aspirin) .... Take 1 Tablet By Mouth Once A Day 3)  Daily Vitamins   Tabs (Multiple Vitamin) .... Take 1 Tablet By Mouth Once A Day 4)  Vitamin D 16109 Unit Caps (Ergocalciferol) .... One Cap By Mouth Weekly 5)  Potassium 99 Mg Tabs (Potassium) .... Take 1 Tablet By Mouth Once A Day 6)  Fish Oil 1000 Mg Caps (Omega-3 Fatty Acids) .... Take 1 Tablet By Mouth Two Times A Day 7)  Vitamin C 500 Mg Tabs (Ascorbic Acid) .... Take 1 Tablet By Mouth Two Times A Day  Allergies  (verified): No Known Drug Allergies  Review of Systems      See HPI General:  Denies chills, fever, malaise, and sleep disorder. Eyes:  Denies blurring and discharge. ENT:  See HPI; Complains of postnasal drainage; denies earache, hoarseness, nasal congestion, and sinus pressure. CV:  Denies chest pain or discomfort, palpitations, shortness of breath with exertion, and swelling of feet. Resp:  Denies cough, sputum productive, and wheezing. GI:  Denies abdominal pain, constipation, diarrhea, nausea, and vomiting. GU:  See HPI. MS:  Denies joint pain, low back pain, mid back pain, and stiffness. Derm:  Denies itching and rash. Neuro:  Denies poor balance, seizures, and sensation of room spinning. Psych:  Denies anxiety and depression. Endo:  Denies cold intolerance, excessive hunger, excessive thirst, excessive urination, heat intolerance, polyuria, and weight change. Heme:  Complains of abnormal bruising; denies bleeding. Allergy:  Complains of seasonal allergies.  Physical Exam  General:  Well-developed,obese iin no acute distress; alert,appropriate and cooperative throughout examination Head:  Normocephalic and atraumatic without obvious abnormalities. No apparent alopecia or balding. Eyes:  No corneal or conjunctival inflammation noted. EOMI. Perrla. Funduscopic exam benign, without hemorrhages, exudates or papilledema. Vision grossly normal. Ears:  External ear exam shows no significant lesions  or deformities.  Otoscopic examination reveals clear canals, tympanic membranes are intact bilaterally without bulging, retraction, inflammation or discharge. Hearing is grossly normal bilaterally. Nose:  External nasal examination shows no deformity or inflammation. Nasal mucosa erythematous and edematopus Mouth:  pharynx pink and moist and teeth missing.   Neck:  No deformities, masses, or tenderness noted. Chest Wall:  No deformities, masses, tenderness or gynecomastia noted. Breasts:  No  masses or gynecomastia noted Lungs:  Normal respiratory effort, chest expands symmetrically. Lungs are clear to auscultation, no crackles or wheezes. Heart:  Normal rate and regular rhythm. S1 and S2 normal without gallop, murmur, click, rub or other extra sounds. Abdomen:  Bowel sounds positive,abdomen soft and non-tender without masses, organomegaly or hernias noted. Rectal:  No external abnormalities noted. Normal sphincter tone. No rectal masses or tenderness.Guaic neg stool Genitalia:  Testes bilaterally descended without nodularity, tenderness or masses. No scrotal masses or lesions. No penis lesions or urethral discharge. Prostate:  Prostate gland firm and smooth, no enlargement, nodularity, tenderness, mass, asymmetry or induration. Msk:  No deformity or scoliosis noted of thoracic or lumbar spine.   Pulses:  R and L carotid,radial,femoral,dorsalis pedis and posterior tibial pulses are full and equal bilaterally Extremities:  No clubbing, cyanosis, edema, or deformity noted with normal full range of motion of all joints.   Neurologic:  No cranial nerve deficits noted. Station and gait are normal. Plantar reflexes are down-going bilaterally. DTRs are symmetrical throughout. Sensory, motor and coordinative functions appear intact. Skin:  Intact without suspicious lesions or rashes Cervical Nodes:  No lymphadenopathy noted Axillary Nodes:  No palpable lymphadenopathy Inguinal Nodes:  No significant adenopathy Psych:  Cognition and judgment appear intact. Alert and cooperative with normal attention span and concentration. No apparent delusions, illusions, hallucinations   Impression & Recommendations:  Problem # 1:  SPECIAL SCREENING FOR MALIGNANT NEOPLASMS COLON (ICD-V76.51) Assessment Improved  Orders: Hemoccult Guaiac-1 spec.(in office) (82270)  negative  Problem # 2:  UNSPECIFIED HEARING LOSS (ICD-389.9) Assessment: Unchanged  Problem # 3:  OBESITY (ICD-278.00) Assessment:  Deteriorated  Ht: 68 (04/25/2009)   Wt: 245 (04/25/2009)   BMI: 37.39 (04/25/2009)  Problem # 4:  HYPERLIPIDEMIA (ICD-272.4) Assessment: Comment Only  His updated medication list for this problem includes:    Lipitor 40 Mg Tabs (Atorvastatin calcium) .Marland Kitchen... Take 1 tab by mouth at bedtime  Orders: T-Hepatic Function [phone]) T-Lipid Profile [phone])  Labs Reviewed: SGOT: 20 (08/25/2008)   SGPT: 19 (08/25/2008)   HDL:43 (08/25/2008), 44 (03/31/2008)  LDL:78 (08/25/2008), 89 (03/31/2008)  Chol:130 (08/25/2008), 142 (03/31/2008)  Trig:46 (08/25/2008), 46 (03/31/2008)  Problem # 5:  ALLERGIC RHINITIS CAUSE UNSPECIFIED (ICD-477.9) Assessment: Comment Only prednisone dose pack prescribed  Complete Medication List: 1)  Lipitor 40 Mg Tabs (Atorvastatin calcium) .... Take 1 tab by mouth at bedtime 2)  Aspirin Ec 81 Mg Tbec (Aspirin) .... Take 1 tablet by mouth once a day 3)  Daily Vitamins Tabs (Multiple vitamin) .... Take 1 tablet by mouth once a day 4)  Vitamin D 13086 Unit Caps (Ergocalciferol) .... One cap by mouth weekly 5)  Potassium 99 Mg Tabs (Potassium) .... Take 1 tablet by mouth once a day 6)  Fish Oil 1000 Mg Caps (Omega-3 fatty acids) .... Take 1 tablet by mouth two times a day 7)  Vitamin C 500 Mg Tabs (Ascorbic acid) .... Take 1 tablet by mouth two times a day 8)  Prednisone (pak) 5 Mg Tabs (Prednisone) .... Use as directed start after fasting labs drawn pls  Other Orders: T-Basic Metabolic Panel [phone]) T-Vitamin D (25-Hydroxy) [phone]) CXR- 2view (CXR)  Patient Instructions: 1)  Please schedule a follow-up appointment in 4 months. 2)  It is important that you exercise regularly at least 20 minutes 5 times a week. If you develop chest pain, have severe difficulty breathing, or feel very tired , stop exercising immediately and seek medical attention. 3)  You need to lose weight. Consider a lower calorie diet and regular exercise.  4)  cXR today pls  . 5)  BMP prior to visit, ICD-9: 6)  Hepatic Panel prior to visit, ICD-9:  asap this wednesday, fasting. 7)  Lipid Panel prior to visit, ICD-9: 8)  I recommend oNE multivit daily, we will see if you still need vit D supplements from labs 9)  Vit D level  10)  Meds are being sent in fro the tickle in your throat whuich i believe is due to uncontrolled allergies . 11)  pLS do nt start tilla fter you have had your bloodwork. Prescriptions: PREDNISONE (PAK) 5 MG TABS (PREDNISONE) Use as directed start AFTER fasting labs drawn pls  #21 x 0   Entered and Authorized by:   [name] MD   Signed by:   [name] MD on 04/25/2009   Method used:   [facility] to        [facility] [phone] (retail)       [address]       [place], [place]  [address]       Ph: [phone] or [phone]       Fax: [phone]   RxID:   [id number]      Laboratory Results  Date/Time Received: April 25, 2009  Date/Time Reported: April 25, 2009   Stool - Occult Blood Hemmoccult #1: negative Date: 04/25/2009 Comments: [id number] 9R 8/12 118 10/12

## 2010-05-11 NOTE — Progress Notes (Signed)
Summary: script -   Phone Note Call from Patient   Caller: Patient Reason for Call: Talk to Nurse Summary of Call: Mr. Marchetta called and wanted to be seen today - advised Dr Lodema Hong out of office - would share his concerns with nurse for direction.  He woke up yesterday (01/04) with congestion and does not want it to go into pnemonia.  Can't wait until next week and does not want to go to Urgent Care.  Can provider on call subscribe medication?  Adivsed Dr. Anthony Sar schedule is full next week (unless we have cancellation).   [phone]  PATIENT IS ON SCHEDULE ALREADY FOR 01/13 @ 9:15 Initial call taken by: Doneen Poisson  Follow-up for Phone Call        returned call, wife states patient went on to urgent care, advised i agree  Follow-up by: Adella Hare LPN,  April 13, 2010 3:16 PM

## 2010-05-11 NOTE — Assessment & Plan Note (Signed)
Summary: NP3 ABNORMAL EKG AND BRADY/EC6 SEEN BY ROSS IN 2008   Primary Provider:  [name] MD  CC:  abnormal ekg.  History of Present Illness: Terry Love is referred by Dr Lodema Hong for bradycardia He has no CAD, valvular disease.  He is asymptomatic and says he has always had a low HR.  He is on no AV nodal blocking drugs.  His CRF's only include smokeless tobacco and elevated lipids.  He is on a statin  He denies syncope, SSCP, dyspnea or edema.  He has not had any recent cardiac w/u.  There has been no PND orthopnea, or sings of CHF  Preventive Screening-Counseling & Management  Alcohol-Tobacco     Smoking Status: never     Cans of tobacco/week: chew 2 pks/wk     Tobacco Counseling: to quit use of tobacco products  Current Problems (verified): 1)  Smokeless Tobacco Abuse  (ICD-305.1) 2)  Abnormal Electrocardiogram  (ICD-794.31) 3)  Bradycardia  (ICD-427.89) 4)  Sinusitis, Acute  (ICD-461.9) 5)  Special Screening Malignant Neoplasm of Prostate  (ICD-V76.44) 6)  Special Screening For Osteoporosis  (ICD-V82.81) 7)  Allergic Rhinitis Cause Unspecified  (ICD-477.9) 8)  Special Screening For Malignant Neoplasms Colon  (ICD-V76.51) 9)  Physical Examination  (ICD-V70.0) 10)  Pneumonia  (ICD-486) 11)  Fatigue  (ICD-780.79) 12)  Unspecified Hearing Loss  (ICD-389.9) 13)  Routine General Medical Exam@[facility]  Care Fac[facility]  (ICD-V70.0) 14)  Tinea Pedis  (ICD-110.4) 15)  Osteopenia  (ICD-733.90) 16)  Bradycardia, Chronic  (ICD-427.89) 17)  Obesity  (ICD-278.00) 18)  Hyperlipidemia  (ICD-272.4)  Current Medications (verified): 1)  Aspirin Ec 81 Mg  Tbec (Aspirin) .... Take 1 Tablet By Mouth Once A Day 2)  Daily Vitamins   Tabs (Multiple Vitamin) .... Take 1 Tablet By Mouth Once A Day 3)  Potassium 99 Mg Tabs (Potassium) .... Take 1 Tablet By Mouth Once A Day 4)  Fish Oil 1000 Mg Caps (Omega-3 Fatty Acids) .... Take 1 Tablet By Mouth Two Times A Day 5)  Vitamin C 500 Mg Tabs (Ascorbic Acid)  .... Take 1 Tablet By Mouth Two Times A Day 6)  Simvastatin 40 Mg Tabs (Simvastatin) .... One Tab By Mouth Qhs 7)  Vitamin D (Ergocalciferol) 50000 Unit Caps (Ergocalciferol) .... One Cap By Mouth Every Week 8)  Cramp Tabs 325-25 Mg Tabs (Acetaminophen-Pamabrom) .... As Needed  Allergies (verified): No Known Drug Allergies  Comments:  Nurse/Medical Assistant: The patient's medication list and allergies were reviewed with the patient and were updated in the Medication and Allergy Lists.  Past History:  Past Medical History: Last updated: 12/27/2007   BRADYCARDIA, CHRONIC (ICD-427.89) OBESITY (ICD-278.00) HYPERLIPIDEMIA (ICD-272.4) FACIAL TIC  Past Surgical History: Last updated: 03/14/2007 Carpal tunnel release right-2006  Family History: Last updated: 27-Dec-2007 Father: brain aneuerism died at 12 Mother: colon ca died at 68 1 Brother deceased at age 26 CVA  Social History: Last updated: 03/14/2007 Marital Status: Married Children: none Occupation: Tree surgeon Never Smoked Alcohol use-no Drug use-no  Social History: Cans of tobacco/week:  chew 2 pks/wk  Review of Systems       Denies fever, malais, weight loss, blurry vision, decreased visual acuity, cough, sputum, SOB, hemoptysis, pleuritic pain, palpitaitons, heartburn, abdominal pain, melena, lower extremity edema, claudication, or rash.   Vital Signs:  Patient profile:   75 year old male Height:      68 inches Weight:      242 pounds Pulse rate:   57 / minute BP  sitting:   115 / 69  (left arm) Cuff size:   large  Vitals Entered By: Carlye Grippe (October 26, 2009 10:06 AM) CC: abnormal ekg   Physical Exam  General:  Affect appropriate Healthy:  appears stated age HEENT: normal Neck supple with no adenopathy JVP normal no bruits no thyromegaly Lungs clear with no wheezing and good diaphragmatic motion Heart:  S1/S2 no murmur,rub, gallop or click PMI normal Abdomen: benighn, BS  positve, no tenderness, no AAA no bruit.  No HSM or HJR Distal pulses intact with no bruits No edema Neuro non-focal Skin warm and dry    Impression & Recommendations:  Problem # 1:  ABNORMAL ELECTROCARDIOGRAM (ICD-794.31) ECG reviewed 7/17 is normal except for bradycardia.  No conduction abnormality or sings of CE or infarct His updated medication list for this problem includes:    Aspirin Ec 81 Mg Tbec (Aspirin) .Marland Kitchen... Take 1 tablet by mouth once a day  Problem # 2:  BRADYCARDIA (ICD-427.89) F/U treadmill to make sure he is chronotropically competent.  No indication for pacer His updated medication list for this problem includes:    Aspirin Ec 81 Mg Tbec (Aspirin) .Marland Kitchen... Take 1 tablet by mouth once a day  Orders: Treadmill (Treadmill)  Problem # 3:  HYPERLIPIDEMIA (ICD-272.4) F/U labs per Dr Lodema Hong His updated medication list for this problem includes:    Simvastatin 40 Mg Tabs (Simvastatin) ..... One tab by mouth qhs  His updated medication list for this problem includes:    Simvastatin 40 Mg Tabs (Simvastatin) ..... One tab by mouth qhs  Problem # 4:  SMOKELESS TOBACCO ABUSE (ICD-305.1) Counseled for less than 10 minutes and recomended nicorette gum and F/U with dentist.  He has full dentures but should have oral mucosal exam  Patient Instructions: 1)  Your physician recommends that you schedule a follow-up appointment in: 13month 2)  Your physician has requested that you have an exercise tolerance test.  For further information please visit [email or web address]  Please also follow instruction sheet, as given.   EKG Report  Procedure date:  10/23/2009  Findings:      SB 54 Otherwise normal ECG

## 2010-05-17 NOTE — Letter (Signed)
Summary: Letter  Letter   Imported By: Lind Guest 05/08/2010 11:24:51  _____________________________________________________________________  External Attachment:    Type:   Image     Comment:   External Document

## 2010-05-17 NOTE — Miscellaneous (Signed)
  Clinical Lists Changes  Medications: Added new medication of TUSSIONEX PENNKINETIC ER 10-8 MG/5ML LQCR (HYDROCOD POLST-CHLORPHEN POLST) one teaspoon twice daily as needed for cough - Signed Rx of TUSSIONEX PENNKINETIC ER 10-8 MG/5ML LQCR (HYDROCOD POLST-CHLORPHEN POLST) one teaspoon twice daily as needed for cough;  #300cc x 0;  Signed;  Entered by: [name] MD;  Authorized by: [name] MD;  Method used: Printed then [facility] to [facility]. [phone]*, [address], [place], [place], [place]  [address], Ph: [phone], Fax: [phone]    Prescriptions: Sandria Senter ER 10-8 MG/5ML LQCR (HYDROCOD POLST-CHLORPHEN POLST) one teaspoon twice daily as needed for cough  #300cc x 0   Entered and Authorized by:   [name] MD   Signed by:   [name] MD on 05/09/2010   Method used:   Printed then faxed to ...       [facility]  [address] [phone]* (retail)       [address]       [place], [place]  [address]       Ph: [phone]       Fax: [phone]   RxID:   [id number]

## 2010-05-17 NOTE — Progress Notes (Signed)
  Phone Note Call from Patient   Caller: Patient Summary of Call: patient would like t[facility]inex sent to [facility] Initial call taken by: Adella Hare LPN,  May 09, 2010 11:03 AM     Appended Document:  pls fax  Appended Document:  med sent

## 2010-05-19 ENCOUNTER — Encounter: Payer: Self-pay | Admitting: Family Medicine

## 2010-05-19 ENCOUNTER — Ambulatory Visit (INDEPENDENT_AMBULATORY_CARE_PROVIDER_SITE_OTHER): Payer: Managed Care, Other (non HMO) | Admitting: Family Medicine

## 2010-05-19 DIAGNOSIS — J309 Allergic rhinitis, unspecified: Secondary | ICD-10-CM | POA: Insufficient documentation

## 2010-05-19 DIAGNOSIS — E785 Hyperlipidemia, unspecified: Secondary | ICD-10-CM

## 2010-05-19 DIAGNOSIS — E669 Obesity, unspecified: Secondary | ICD-10-CM

## 2010-05-22 ENCOUNTER — Telehealth (INDEPENDENT_AMBULATORY_CARE_PROVIDER_SITE_OTHER): Payer: Self-pay | Admitting: *Deleted

## 2010-05-25 NOTE — Assessment & Plan Note (Signed)
Summary: FOLLOW UP 1 MONTH   Vital Signs:  Patient profile:   75 year old male Height:      68 inches Weight:      241 pounds BMI:     36.78 O2 Sat:      95 % Pulse rate:   58 / minute Pulse rhythm:   regular Resp:     16 per minute BP sitting:   110 / 68  (left arm)  Vitals Entered By: Everitt Amber LPN (May 19, 2010 10:51 AM)  Nutrition Counseling: Patient's BMI is greater than 25 and therefore counseled on weight management options. CC: Follow up, still some congestion that won't leave   Primary Care Provider:  [name] MD  CC:  Follow up and still some congestion that won't leave.  History of Present Illness: Reports  that he is doing well. Denies recent fever or chills. Denies sinus pressure, nasal congestion , ear pain or sore throat.Reports post nasal drainage . Denies chest congestion, or cough productive of sputum. Denies chest pain, palpitations, PND, orthopnea or leg swelling. Denies abdominal pain, nausea, vomitting, diarrhea or constipation. Denies change in bowel movements or bloody stool. Denies dysuria , frequency, incontinence or hesitancy. Denies  joint pain, swelling, or reduced mobility. Denies headaches, vertigo, seizures. Denies depression, anxiety or insomnia. Denies  rash, lesions, or itch.     Current Medications (verified): 1)  Aspirin Ec 81 Mg  Tbec (Aspirin) .... Take 1 Tablet By Mouth Once A Day 2)  Daily Vitamins   Tabs (Multiple Vitamin) .... Take 1 Tablet By Mouth Once A Day 3)  Fish Oil 1000 Mg Caps (Omega-3 Fatty Acids) .... Take 1 Tablet By Mouth Two Times A Day 4)  Vitamin C 500 Mg Tabs (Ascorbic Acid) .... Take 1 Tablet By Mouth Two Times A Day 5)  Vitamin D (Ergocalciferol) 50000 Unit Caps (Ergocalciferol) .... One Cap By Mouth Every Week 6)  Cramp Tabs 325-25 Mg Tabs (Acetaminophen-Pamabrom) .... As Needed 7)  Lipitor 40 Mg Tabs (Atorvastatin Calcium) .... Take 1 Tab By Mouth At Bedtime 8)  Oscal 500/200 D-3 500-200  Mg-Unit Tabs (Calcium-Vitamin D) .... Take 1 Tablet By Mouth Three Times A Day 9)  Proair Hfa 108 (90 Base) Mcg/act Aers (Albuterol Sulfate) .... Inhale 2 Puffs By Mouth Every 4 Hrs As Needed 10)  Tussionex Pennkinetic Er 10-8 Mg/73ml Lqcr (Hydrocod Polst-Chlorphen Polst) .... One Teaspoon Twice Daily As Needed For Cough  Allergies (verified): No Known Drug Allergies  Review of Systems      See HPI Eyes:  Denies blurring, discharge, eye pain, and red eye. ENT:  Complains of postnasal drainage. Resp:  Complains of cough; denies sputum productive; occasional, much improved. Endo:  Denies cold intolerance, excessive hunger, excessive thirst, excessive urination, heat intolerance, and weight change. Heme:  Denies abnormal bruising and bleeding. Allergy:  Complains of seasonal allergies.  Physical Exam  General:  Well-developed,well-nourished,in no acute distress; alert,appropriate and cooperative throughout examination HEENT: No facial asymmetry,  EOMI, No sinus tenderness, TM's Clear, oropharynx  pink and moist.   Chest: Clear to auscultation bilaterally.  CVS: S1, S2, No murmurs, No S3.   Abd: Soft, Nontender.  MS: Adequate ROM spine, hips, shoulders and knees.  Ext: No edema.   CNS: CN 2-12 intact, power tone and sensation normal throughout.   Skin: Intact, no visible lesions or rashes.  Psych: Good eye contact, normal affect.  Memory intact, not anxious or depressed appearing.    Impression & Recommendations:  Problem # 1:  ALLERGIC RHINITIS CAUSE UNSPECIFIED (ICD-477.9) Assessment Comment Only  His updated medication list for this problem includes:    Fluticasone Propionate 50 Mcg/act Susp (Fluticasone propionate) ..... One to two puffs per nostril once daily, for allergies  Orders: Medicare Electronic Prescription [phone])  Problem # 2:  OBESITY (ICD-278.00) Assessment: Unchanged  Ht: 68 (05/19/2010)   Wt: 241 (05/19/2010)   BMI: 36.78 (05/19/2010) therapeutic lifestyle  change discussed and encouraged  Problem # 3:  HYPERLIPIDEMIA (ICD-272.4) Assessment: Comment Only  His updated medication list for this problem includes:    Lipitor 40 Mg Tabs (Atorvastatin calcium) .Marland Kitchen... Take 1 tab by mouth at bedtime  Orders: T-Lipid Profile ([id number]) T-Hepatic Function [phone]) Low fat dietdiscussed and encouraged  Labs Reviewed: SGOT: 20 (04/18/2010)   SGPT: 20 (04/18/2010)  Prior 10 Yr Risk Heart Disease: 9 % (11/28/2009)   HDL:50 (04/18/2010), 43 (12/14/2009)  LDL:146 (04/18/2010), 120 (12/14/2009)  Chol:209 (04/18/2010), 174 (12/14/2009)  Trig:67 (04/18/2010), 56 (12/14/2009)  Complete Medication List: 1)  Aspirin Ec 81 Mg Tbec (Aspirin) .... Take 1 tablet by mouth once a day 2)  Daily Vitamins Tabs (Multiple vitamin) .... Take 1 tablet by mouth once a day 3)  Fish Oil 1000 Mg Caps (Omega-3 fatty acids) .... Take 1 tablet by mouth two times a day 4)  Vitamin C 500 Mg Tabs (Ascorbic acid) .... Take 1 tablet by mouth two times a day 5)  Vitamin D (ergocalciferol) 50000 Unit Caps (Ergocalciferol) .... One cap by mouth every week 6)  Cramp Tabs 325-25 Mg Tabs (Acetaminophen-pamabrom) .... As needed 7)  Lipitor 40 Mg Tabs (Atorvastatin calcium) .... Take 1 tab by mouth at bedtime 8)  Oscal 500/200 D-3 500-200 Mg-unit Tabs (Calcium-vitamin d) .... Take 1 tablet by mouth three times a day 9)  Tussionex Pennkinetic Er 10-8 Mg/64ml Lqcr (Hydrocod polst-chlorphen polst) .... One teaspoon twice daily as needed for cough 10)  Fluticasone Propionate 50 Mcg/act Susp (Fluticasone propionate) .... One to two puffs per nostril once daily, for allergies  Patient Instructions: 1)  CPE  in May,early preferred. 2)  New med for allergies, pls use nasal spray daily ubtil the drainage reduces. 3)  Your lungs are clear, your cXR normal and no bacteria grew in your sputum. 4)  pls reduce the use of the cough syrup to night time only if needed 5)  Hepatic Panel prior to  visit, ICD-9:   fasting in may 6)  Lipid Panel prior to visit, ICD-9: Prescriptions: FLUTICASONE PROPIONATE 50 MCG/ACT SUSP (FLUTICASONE PROPIONATE) one to two puffs per nostril once daily, for allergies  #1 x 3   Entered and Authorized by:   [name] MD   Signed by:   [name] MD on 05/19/2010   Method used:   [facility]ically to        [facility] [phone]* (retail)       [address]       [place], [place]  [address]       Ph: [phone]       Fax: [phone]   RxID:   [id number]    Orders Added: 1)  Est. Patient Level IV [38756] 2)  Medicare Electronic Prescription [G8553] 3)  T-Lipid Profile [[id number]] 4)  T-Hepatic Function [[id number]]

## 2010-05-31 NOTE — Progress Notes (Signed)
Summary: medicine  Phone Note Call from Patient   Summary of Call: call him about a rx from the other day  call back at [phone] Initial call taken by: Lind Guest,  May 22, 2010 12:50 PM  Follow-up for Phone Call        the tussinex was faxed but the pharmacy never recieved it. Going to refax  Follow-up by: Everitt Amber LPN,  May 22, 2010 1:08 PM    Prescriptions: Sandria Senter ER 10-8 MG/5ML LQCR (HYDROCOD POLST-CHLORPHEN POLST) one teaspoon twice daily as needed for cough  #300cc x 0   Entered by:   Everitt Amber LPN   Authorized by:   [name] MD   Signed by:   Everitt Amber LPN on 11/91/4782   Method used:   Print[facility] then [facility] ...       [facility]  [address] [phone]* (retail)       [address]       [place], [place]  [address]       Ph: [phone]       Fax: [phone]   RxID:   [id number]

## 2010-06-14 ENCOUNTER — Encounter: Payer: Self-pay | Admitting: Cardiovascular Disease

## 2010-06-14 ENCOUNTER — Ambulatory Visit (INDEPENDENT_AMBULATORY_CARE_PROVIDER_SITE_OTHER): Payer: Managed Care, Other (non HMO) | Admitting: Cardiovascular Disease

## 2010-06-14 DIAGNOSIS — E785 Hyperlipidemia, unspecified: Secondary | ICD-10-CM

## 2010-06-14 DIAGNOSIS — I498 Other specified cardiac arrhythmias: Secondary | ICD-10-CM

## 2010-06-20 NOTE — Assessment & Plan Note (Signed)
Summary: FOLLOW UP - 6 MONTHS   Visit Type:  Follow-up Primary Provider:  [name] MD  CC:  no cardiology complaints.  History of Present Illness: Terry Love is referred by Dr Lodema Hong for bradycardia He has no CAD, valvular disease.  He is asymptomatic and says he has always had a low HR.  He is on no AV nodal blocking drugs.  His CRF's only include smokeless tobacco and elevated lipids.  He is on a statin  He denies syncope, SSCP, dyspnea or edema.  He has not had any recent cardiac w/u.  There has been no PND orthopnea, or sings of CHF  Lab work with Dr Lodema Hong.    Current Problems (verified): 1)  Allergic Rhinitis Cause Unspecified  (ICD-477.9) 2)  Smokeless Tobacco Abuse  (ICD-305.1) 3)  Bradycardia  (ICD-427.89) 4)  Physical Examination  (ICD-V70.0) 5)  Fatigue  (ICD-780.79) 6)  Unspecified Hearing Loss  (ICD-389.9) 7)  Routine General Medical Exam@health  Care Facl  (ICD-V70.0) 8)  Tinea Pedis  (ICD-110.4) 9)  Osteopenia  (ICD-733.90) 10)  Bradycardia, Chronic  (ICD-427.89) 11)  Obesity  (ICD-278.00) 12)  Hyperlipidemia  (ICD-272.4)  Current Medications (verified): 1)  Aspirin Ec 81 Mg  Tbec (Aspirin) .... Take 1 Tablet By Mouth Once A Day 2)  Daily Vitamins   Tabs (Multiple Vitamin) .... Take 1 Tablet By Mouth Once A Day 3)  Fish Oil 1000 Mg Caps (Omega-3 Fatty Acids) .... Take 1 Tablet By Mouth Two Times A Day 4)  Vitamin C 500 Mg Tabs (Ascorbic Acid) .... Take 1 Tablet By Mouth Two Times A Day 5)  Vitamin D (Ergocalciferol) 50000 Unit Caps (Ergocalciferol) .... One Cap By Mouth Every Week 6)  Cramp Tabs 325-25 Mg Tabs (Acetaminophen-Pamabrom) .... As Needed 7)  Lipitor 40 Mg Tabs (Atorvastatin Calcium) .... Take 1 Tab By Mouth At Bedtime 8)  Oscal 500/200 D-3 500-200 Mg-Unit Tabs (Calcium-Vitamin D) .... Take 1 Tablet By Mouth Three Times A Day 9)  Tussionex Pennkinetic Er 10-8 Mg/89ml Lqcr (Hydrocod Polst-Chlorphen Polst) .... One Teaspoon Twice Daily As Needed For  Cough 10)  Fluticasone Propionate 50 Mcg/act Susp (Fluticasone Propionate) .... One To Two Puffs Per Nostril Once Daily, For Allergies 11)  Mucinex 600 Mg Xr12h-Tab (Guaifenesin) .... Take 1 Tab Diaily  Allergies (verified): No Known Drug Allergies  Comments:  Nurse/Medical Assistant: [facility]  Past History:  Past Medical History: Last updated: 01-10-08   BRADYCARDIA, CHRONIC (ICD-427.89) OBESITY (ICD-278.00) HYPERLIPIDEMIA (ICD-272.4) FACIAL TIC  Past Surgical History: Last updated: 03/14/2007 Carpal tunnel release right-2006  Family History: Last updated: 10-Jan-2008 Father: brain aneuerism died at 69 Mother: colon ca died at 67 1 Brother deceased at age 61 CVA  Social History: Last updated: 10/26/2009 Children: none Never Smoked Alcohol use-no Drug use-no  Review of Systems       Denies fever, malais, weight loss, blurry vision, decreased visual acuity, cough, sputum, SOB, hemoptysis, pleuritic pain, palpitaitons, heartburn, abdominal pain, melena, lower extremity edema, claudication, or rash.   Vital Signs:  Patient profile:   75 year old male Weight:      242 pounds BMI:     36.93 Pulse rate:   57 / minute BP sitting:   112 / 62  (left arm)  Vitals Entered By: Dreama Saa, CNA (June 14, 2010 11:14 AM)  Physical Exam  General:  Affect appropriate Healthy:  appears stated age HEENT: normal Neck supple with no adenopathy JVP normal no  bruits no thyromegaly Lungs clear with no wheezing and good diaphragmatic motion Heart:  S1/S2 no murmur,rub, gallop or click PMI normal Abdomen: benighn, BS positve, no tenderness, no AAA no bruit.  No HSM or HJR Distal pulses intact with no bruits No edema Neuro non-focal Skin warm and dry    Impression & Recommendations:  Problem # 1:  BRADYCARDIA (ICD-427.89) Benign and asymptomatic His updated medication list for this problem includes:    Aspirin Ec 81 Mg  Tbec (Aspirin) .Marland Kitchen... Take 1 tablet by mouth once a day  Problem # 2:  HYPERLIPIDEMIA (ICD-272.4) Continue statin Labs with Dr Lodema Hong His updated medication list for this problem includes:    Lipitor 40 Mg Tabs (Atorvastatin calcium) .Marland Kitchen... Take 1 tab by mouth at bedtime  Patient Instructions: 1)  Your physician recommends that you schedule a follow-up appointment in: as needed  2)  Your physician recommends that you continue on your current medications as directed. Please refer to the Current Medication list given to you today.

## 2010-08-04 ENCOUNTER — Encounter: Payer: Self-pay | Admitting: Family Medicine

## 2010-08-04 ENCOUNTER — Other Ambulatory Visit: Payer: Self-pay | Admitting: Family Medicine

## 2010-08-05 LAB — HEPATIC FUNCTION PANEL
Albumin: 4.3 g/dL (ref 3.5–5.2)
Total Bilirubin: 0.9 mg/dL (ref 0.3–1.2)
Total Protein: 7 g/dL (ref 6.0–8.3)

## 2010-08-05 LAB — LIPID PANEL
HDL: 43 mg/dL (ref >39)
LDL Cholesterol: 76 mg/dL (ref 0–99)
Total CHOL/HDL Ratio: 3 Ratio
Triglycerides: 55 mg/dL (ref <150)

## 2010-08-10 ENCOUNTER — Encounter: Payer: Self-pay | Admitting: Family Medicine

## 2010-08-11 ENCOUNTER — Ambulatory Visit (INDEPENDENT_AMBULATORY_CARE_PROVIDER_SITE_OTHER): Payer: Managed Care, Other (non HMO) | Admitting: Family Medicine

## 2010-08-11 ENCOUNTER — Encounter: Payer: Self-pay | Admitting: Family Medicine

## 2010-08-11 DIAGNOSIS — R5381 Other malaise: Secondary | ICD-10-CM

## 2010-08-11 DIAGNOSIS — J309 Allergic rhinitis, unspecified: Secondary | ICD-10-CM

## 2010-08-11 DIAGNOSIS — Z1211 Encounter for screening for malignant neoplasm of colon: Secondary | ICD-10-CM

## 2010-08-11 DIAGNOSIS — E669 Obesity, unspecified: Secondary | ICD-10-CM

## 2010-08-11 DIAGNOSIS — E785 Hyperlipidemia, unspecified: Secondary | ICD-10-CM

## 2010-08-11 DIAGNOSIS — M899 Disorder of bone, unspecified: Secondary | ICD-10-CM

## 2010-08-11 DIAGNOSIS — M949 Disorder of cartilage, unspecified: Secondary | ICD-10-CM

## 2010-08-11 MED ORDER — ERGOCALCIFEROL 1.25 MG (50000 UT) PO CAPS: 50000.0000 [IU] | ORAL_CAPSULE | ORAL | Status: DC

## 2010-08-11 NOTE — Patient Instructions (Signed)
F/u in 4 months.  Fasting lipid, hepatic, chem 7 in 4 months.  PLS reduce the cholesterol med atorvastatin to one tablet 5 days per week, Monday through Friday only.  Your lab work is excellent, and congratulations on weight loss

## 2010-08-12 NOTE — Assessment & Plan Note (Signed)
Improved, reduce lipitor to 4 days per week, esp in light of excellent profile and c/o mild muscle aches

## 2010-08-12 NOTE — Assessment & Plan Note (Signed)
Improved, on no prescription medication at this time

## 2010-08-12 NOTE — Progress Notes (Signed)
  Subjective:    Patient ID: Terry Love, male    DOB: 04/29/1935, 75 y.o.   MRN: [id number]  HPI The PT is here forannual exam and re-evaluation of chronic medical conditions, medication management and review of recent lab .Preventive health is updated, specifically  Cancer screening, and Immunization.   He c/o increased muscle aches with generic lipitor, but in light of the excellent results , the dose will be reduced [name] has been diligently working at lifestyle change for improved health and ha lost approx 10 pounds in the past 2 years , which is wonderful.    Review of Systems Denies recent fever or chills. Denies sinus pressure, nasal congestion, ear pain or sore throat. Denies chest congestion, productive cough or wheezing. Denies chest pains, palpitations, paroxysmal nocturnal dyspnea, orthopnea and leg swelling Denies abdominal pain, nausea, vomiting,diarrhea or constipation.  Denies rectal bleeding or change in bowel movement. Denies dysuria, frequency, hesitancy or incontinence. Denies joint pain, swelling and limitation in mobility. Denies headaches, seizure, numbness, or tingling. Denies depression, anxiety or insomnia. Denies skin break down or rash.        Objective:   Physical Exam    Pleasant well nourished male, alert and oriented x 3, in no cardio-pulmonary distress. Afebrile. HEENT No facial trauma or asymetry.   EOMI, PERTL, fundoscopic exam is negative for hemorhages or exudates. External ears normal, tympanic membranes clear. Oropharynx moist, no exudate, good dentition. Neck: supple, no adenopathy,JVD or thyromegaly.No bruits.  Chest: Clear to ascultation bilaterally.No crackles or wheezes. Non tender to palpation  Breast: No asymetry,no masses. No nipple discharge or inversion. No axillary or supraclavicular adenopathy  Cardiovascular system; Heart sounds normal,  S1 and  S2 ,no S3.  No murmur, or thrill. Apical beat not  displaced Peripheral pulses normal.No edema  Abdomen: Soft,obese, non tender, no organomegaly or masses. No bruits. Bowel sounds normal. No guarding, tenderness or rebound.  Rectal:  No mass. guaic negative stool. Prostate smooth  GU: No penile lesion or discharge. No testicular mass.  Musculoskeletal exam: Full ROM of spine, hips , shoulders and knees. No deformity ,swelling or crepitus noted. No muscle wasting or atrophy.   Neurologic: Cranial nerves 2 to 12 intact. Power, tone ,sensation and reflexes normal throughout. No disturbance in gait. No tremor.  Skin: Intact, no ulceration, erythema , scaling or rash noted. Pigmentation normal throughout  Psych; Normal mood and affect. Judgement and concentration normal      Assessment & Plan:

## 2010-08-12 NOTE — Assessment & Plan Note (Signed)
Improved. Pt applauded on succesful weight loss through lifestyle change, and encouraged to continue same. Weight loss goal set for the next several months.  

## 2010-08-17 ENCOUNTER — Other Ambulatory Visit: Payer: Self-pay

## 2010-08-17 MED ORDER — ERGOCALCIFEROL 1.25 MG (50000 UT) PO CAPS: 50000.0000 [IU] | ORAL_CAPSULE | ORAL | Status: DC

## 2010-08-22 NOTE — Assessment & Plan Note (Signed)
[facility]                       [place] CARDIOLOGY OFFICE NOTE   KEIDEN, DESKIN                      MRN:          [id number]  DATE:01/17/2007                            DOB:          [birth year]    IDENTIFICATION:  Terry Love is a 75 year old who is referred for  evaluation of bradycardia.   HISTORY OF PRESENT ILLNESS:  The patient was seen by Dr. Lodema Hong his  regular doctor.  An EKG was done that showed sinus bradycardia.  He was  referred for further evaluation.  He denies fatigue but he says he has  slowed some in doing things.  Still, no body has commented that he is  moving too slow.  He denies dizziness, no presyncope or syncope, no  chest pain.   CURRENT MEDICATIONS:  1. Trihexyphenidyl  25.  2. Lipitor 40.  3. Calcium with D.  4. Aspirin 81.  5. Vitamin C.  6. Vitamin E.  7. Potassium 99 mcg.   PAST MEDICAL HISTORY:  1. Dyslipidemia.  2. Eye twitch.   SOCIAL HISTORY:  The patient does not smoke, does not drink.   FAMILY HISTORY:  Negative for premature CAD.  Father had an aneurysm in  his head, died of this.   REVIEW OF SYSTEMS:  All systems reviewed and negative to the above  problem except as noted.   PHYSICAL EXAMINATION:  GENERAL:  The patient is in no distress.  VITAL SIGNS:  Blood pressure 116/76, pulse is 44, weight 245.  HEENT:  Normocephalic atraumatic.  EOMI.  PERRL.  Throat clear.  Mucous  membranes moist.  LUNGS:  Clear to auscultation.  No rales or wheezes.  CARDIAC:  Regular rate and rhythm.  Grade 1-2/6 systolic murmur heard  best in the left sternal border radiating to the outflow track.  NECK:  No bruits.  JVP is normal.  ABDOMEN:  Obese but no hepatomegaly, no palpable masses.  EXTREMITIES:  Good distal pulse.  No lower extremity edema.  NEUROLOGIC:  Cranial nerves II-XII grossly intact.   A 12-lead EKG shows normal sinus bradycardia at 47 beats per minute with  a PR interval of 226  milliseconds, nonspecific ST-changes.   IMPRESSION:  Terry Love is a 75 year old gentleman with a history of  bradycardia on EKG.   He said his activity had slowed some but not convinced he has been  symptomatic from this, no dizziness, no significant shortness of breath.   I would recommend getting a 24-hour Holter monitor, checking a TSH, and  also getting an echocardiogram to evaluate.  I would continue his  current regimen.  I am not sure if trihexyphenidyl has any effect on the  EKG and will need to re-evaluate this.  Continue for now.  I encouraged  him to stop vitamin C as no convincing data from cardiac standpoint,  possible negative.   I will be in touch with the patient regarding the test results and where  to proceed tentatively otherwise place for one year followup to re-  evaluation his heart rate.     Pricilla Riffle, MD, [facility]  Electronically Signed  PVR/MedQ  DD: 01/17/2007  DT: 01/17/2007  Job #: [id number]   cc:   Milus Mallick. Lodema Hong, M.D.

## 2010-08-22 NOTE — Assessment & Plan Note (Signed)
[facility]                       [place] CARDIOLOGY OFFICE NOTE   RONEN, BROMWELL                      MRN:          [id number]  DATE:01/01/2008                            DOB:          June 08, 1935    PRIMARY CARE PHYSICIAN:  Milus Mallick. Lodema Hong, MD.   REASON FOR VISIT:  Followup bradycardia.   HISTORY OF PRESENT ILLNESS:  This is my first meeting with Terry Love.  He is a very pleasant 75 year old gentleman previously followed by Dr.  Tenny Craw and last seen in the office back in October 2008.  He has a fairly  well documented history of chronic bradycardia that has been largely  asymptomatic.  I see that he wore a Holter monitor back in 2008, which  showed sinus bradycardia with an average heart rate of 53 and the lowest  heart rate being 37 beats per minute, the highest being 95 beats per  minute.  He is not on any AV nodal blocking drugs and has previously  documented normal left ventricular systolic function by  echocardiography.  He continues to work 12-hour shifts with [employer] and does not describe any major functional  limitations.  He has not had any exertional chest pain or any  breathlessness beyond NYHA class II.  He states that he regularly has to  [place] court [place] and does not have to stop while  doing this.  He has had no dizziness or syncope.  Today's  electrocardiogram shows sinus bradycardia at 53 beats per minute with  nonspecific T-wave changes in a prolonged PR interval of 210  milliseconds.  His tracing from 2008 actually showed a slower heart rate  at 46 with a PR interval of 226 milliseconds.  Today, we talked about  observation for any new symptomatology and some of the basics about  progressive conduction system disease.  Certainly, if his heart rate  trend continues to lower and he becomes symptomatic, then he may become  a candidate for pacemaker.  He is not reporting  any ischemic symptoms.   PRESENT MEDICATIONS:  Include,  1. Lipitor 40 mg p.o. nightly.  2. Aspirin 81 mg p.o. daily.  3. Vitamin C 500 mg p.o. daily.  4. Potassium 900 mcg p.o. daily.   REVIEW OF SYSTEMS:  As described above.  Otherwise negative.   PHYSICAL EXAMINATION:  VITAL SIGNS:  Blood pressure today is 108/70,  heart rate is 53 and regular, weight is 145 pounds.  GENERAL:  This is an obese male in no acute distress.  HEENT:  Conjunctiva is normal.  Oropharynx is clear.  NECK:  Supple.  No elevated jugular venous pressure. No audible bruits.  No thyromegaly is noted.  LUNGS:  Clear on breathing at rest.  CARDIAC:  regular rate and rhythm.  No S3 gallop or pericardial rub.  No  significant systolic murmur.  ABDOMEN:  Soft, nontender with normal bowel sounds.  EXTREMITIES:  No significant pitting edema.  Positive distal pulses.  SKIN:  Warm and dry.  MUSCULOSKELETAL:  No kyphosis noted.  NEUROPSYCHIATRIC:  Patient alert and  oriented x3.  Affect is appropriate   IMPRESSION AND RECOMMENDATIONS:  Chronic sinus bradycardia as outlined  above, essentially asymptomatic based on symptom review with the  patient.  His intervals look stable by followup tracing, actually with a  shorter PR interval than in 2008.  At this point, would recommend basic  observation.  I will plan to see him back over the next 6 months.     Jonelle Sidle, MD  Electronically Signed    SGM/MedQ  DD: 01/01/2008  DT: 01/02/2008  Job #: [id number]   cc:   Milus Mallick. Lodema Hong, M.D.

## 2010-08-25 NOTE — Op Note (Signed)
NAME:  Terry Love, Terry Love               ACCOUNT NO.:  [id number]   MEDICAL RECORD NO.:  [id number]          PATIENT TYPE:  AMB   LOCATION:  DAY                           FACILITY:  [facility]   PHYSICIAN:  Lionel December, M.D.    DATE OF BIRTH:  [birth year]   DATE OF PROCEDURE:  09/18/2005  DATE OF DISCHARGE:                                 OPERATIVE REPORT   PROCEDURE:  Colonoscopy.   INDICATIONS:  Terry Love is a 75 year old African American male who is undergoing  high-risk screening colonoscopy.  Family history is positive for colon  carcinoma in his mother.  His last exam was in April 2001 with removal of  small polyp which turned out to be hyperplastic.  Procedure risks were  reviewed with the patient, informed consent was obtained.   MEDICATIONS FOR CONSCIOUS SEDATION:  Demerol 25 mg IV and Versed 2 mg IV.   FINDINGS:  Procedure performed in endoscopy suite.  The patient's vital  signs and O2 sats were monitored during procedure and remained stable.  The  patient was placed in left lateral recumbent position.  Rectal examination  performed.  No abnormality noted on external or digital exam.  Olympus  videoscope was placed in rectum and advanced under vision into sigmoid colon  and beyond.  Preparation was satisfactory.  A few scattered diverticula at  sigmoid colon and few more at the ascending colon.  Scope was passed in  cecum which was identified by appendiceal orifice, ileocecal valve.  Pictures taken for the record.  As the scope was withdrawn, colonic mucosa  was examined for the second time and no polyps and/or masses were noted.  Rectal mucosa similarly was normal.  Scope was retroflexed and examined  anorectal junction and small hemorrhoids were noted below the dentate line.  Endoscope was straightened and withdrawn.  The patient tolerated the  procedure well.   FINAL DIAGNOSIS:  1.  No evidence of colonic polyps.  2.  Few scattered diverticula at sigmoid and ascending colon.  3.   Small external hemorrhoids.   RECOMMENDATIONS:  1.  High-fiber diet plus fiber supplement 3-4 g per day.  2.  Yearly Hemoccults.  3.  He should consider next screening exam in five years from now.      Lionel December, M.D.  Electronically Signed     NR/MEDQ  D:  09/18/2005  T:  09/18/2005  Job:  [id number]   cc:   Milus Mallick. Lodema Hong, M.D.  Fax: [phone]

## 2010-08-25 NOTE — Op Note (Signed)
NAMESOLACE, Terry Love               ACCOUNT NO.:  [id number]   MEDICAL RECORD NO.:  [id number]          PATIENT TYPE:  AMB   LOCATION:  DSC                          FACILITY:  [facility]   PHYSICIAN:  Cindee Salt, M.D.       DATE OF BIRTH:  [birth year]   DATE OF PROCEDURE:  06/01/2004  DATE OF DISCHARGE:                                 OPERATIVE REPORT   PREOPERATIVE DIAGNOSIS:  Carpal tunnel syndrome, right hand.   POSTOPERATIVE DIAGNOSIS:  Carpal tunnel syndrome, right hand.   OPERATION:  Release of right carpal canal.   SURGEON:  Cindee Salt, M.D.   ASSISTANT:  Carolyne Fiscal.   ANESTHESIA:  Forearm-based IV regional.   HISTORY:  The patient is a 75 year old male with a history of numbness and  tingling to his hand.  Nerve conductions revealed a severe carpal tunnel  syndrome.   PROCEDURE:  The patient was brought to the operating room where a forearm-  based IV regional anesthetic was carried out without difficulty.  He was  prepped using DuraPrep, supine position, right arm free.  A longitudinal  incision was made in the palm and carried down through subcutaneous tissue;  bleeders were electrocauterized.  The palmar fascia was separated, the  superficial palmar arch identified, the flexor tendon to the ring and little  finger identified.  To the ulnar side of the median nerve, the carpal  retinaculum was incised with sharp dissection.  A right-angle and Sewell  retractor were placed between skin form forearm fascia with release for  approximately a  centimeter and half proximal to the wrist crease under  direct vision.  Canal was explored.  The nerve was immediately apparent and  was compressed with an area of hyperemia, grossly apparent.  No further  lesions were identified.  The wound was irrigated.  The skin was closed with  interrupted 5-0 nylon sutures.  A sterile compressive dressing and splint  were applied.  The patient tolerated the procedure well and was taken to the  recovery  for observation in satisfactory condition.   He is discharged home to return to [facility] in 1 week on  Vicodin.      GK/MEDQ  D:  06/01/2004  T:  06/01/2004  Job:  [id number]

## 2010-08-25 NOTE — Procedures (Signed)
NAME:  Terry Love, DORNFELD               ACCOUNT NO.:  [id number]   MEDICAL RECORD NO.:  [id number]          PATIENT TYPE:  OUT   LOCATION:  RAD                           FACILITY:  [facility]   PHYSICIAN:  Gerrit Friends. Dietrich Pates, MD, FACCDATE OF BIRTH:  25-Nov-1935   DATE OF PROCEDURE:  DATE OF DISCHARGE:  01/20/2007                                ECHOCARDIOGRAM   REFERRING:  Drs. Lodema Hong and [employer].   CLINICAL DATA:  A 75 year old gentleman with bradycardia.  Aorta 3.6,  left atrium 4.0, septum 1.7, posterior wall 1.2, LV diastole 3.9, LV  systole 2.9.   1. Technically adequate echocardiographic study.  2. Left atrial size at the upper limit of normal; normal right atrium      and right ventricle.  3. Very mild dilatation of the proximal ascending aorta; mild      calcification of the wall and annulus.  4. Mild sclerosis of a trileaflet aortic valve; trivial insufficiency.  5. Normal mitral and tricuspid valve; physiologic tricuspid      regurgitation.  6. Normal pulmonic valve and proximal pulmonary artery.  7. Normal left ventricular size; mild hypertrophy; normal regional and      global function.  8. Normal IVC.      Gerrit Friends. Dietrich Pates, MD, [facility]  Electronically Signed     RMR/MEDQ  D:  01/22/2007  T:  01/23/2007  Job:  [id number]

## 2010-10-12 ENCOUNTER — Ambulatory Visit (INDEPENDENT_AMBULATORY_CARE_PROVIDER_SITE_OTHER): Payer: Managed Care, Other (non HMO) | Admitting: Family Medicine

## 2010-10-12 ENCOUNTER — Encounter: Payer: Self-pay | Admitting: Family Medicine

## 2010-10-12 ENCOUNTER — Emergency Department (HOSPITAL_COMMUNITY)
Admission: EM | Admit: 2010-10-12 | Discharge: 2010-10-12 | Disposition: A | Discharge status: Home / Self Care | Payer: Managed Care, Other (non HMO) | Attending: Emergency Medicine | Admitting: Emergency Medicine

## 2010-10-12 ENCOUNTER — Emergency Department (HOSPITAL_COMMUNITY): Payer: Managed Care, Other (non HMO)

## 2010-10-12 DIAGNOSIS — E785 Hyperlipidemia, unspecified: Secondary | ICD-10-CM | POA: Insufficient documentation

## 2010-10-12 DIAGNOSIS — R062 Wheezing: Secondary | ICD-10-CM | POA: Insufficient documentation

## 2010-10-12 DIAGNOSIS — R509 Fever, unspecified: Secondary | ICD-10-CM

## 2010-10-12 DIAGNOSIS — J069 Acute upper respiratory infection, unspecified: Secondary | ICD-10-CM | POA: Insufficient documentation

## 2010-10-12 DIAGNOSIS — Z7982 Long term (current) use of aspirin: Secondary | ICD-10-CM | POA: Insufficient documentation

## 2010-10-12 MED ORDER — ALBUTEROL SULFATE (2.5 MG/3ML) 0.083% IN NEBU
2.5000 mg | INHALATION_SOLUTION | Freq: Once | RESPIRATORY_TRACT | Status: AC
  Administered 2010-10-12: 2.5 mg via RESPIRATORY_TRACT

## 2010-10-12 MED ORDER — IPRATROPIUM BROMIDE 0.02 % IN SOLN
0.5000 mg | Freq: Once | RESPIRATORY_TRACT | Status: AC
  Administered 2010-10-12: 0.5 mg via RESPIRATORY_TRACT

## 2010-10-12 NOTE — Patient Instructions (Signed)
F/u in 3 weeks.  You are being taken to the ED for evaluation of your feevr, drowsiness, and wheezing

## 2010-10-12 NOTE — Assessment & Plan Note (Signed)
Pt in mild rspiratory distress, reduced air entry bilaterally and wheeze, neb treatment administered before taking him to the ED

## 2010-10-12 NOTE — Assessment & Plan Note (Signed)
Febrile pt with reduced mentation acutely, taken to the Ed for further evaluation, discussed with ED doc also

## 2010-10-12 NOTE — Progress Notes (Signed)
  Subjective:    Patient ID: Terry Love, male    DOB: 10-24-1935, 75 y.o.   MRN: [id number]  HPI Pt comes in accompanied by his wife, febrile, slightly confused, and wheezing. She reports that for the last 3 days he has had these symptoms and this has progressively worsened. She has noted him to be excessively sleep also, and somewhat confused.He has also been coughing  Review of Systems See hPI    Objective:   Physical Exam    Patient  Excessively drowsy and somewhat confused, not at all his usual alert self.  HEENT: No facial asymmetry, EOMI, no sinus tenderness,  Oropharynx pink and moist.  Neck supple no adenopathy.  Chest:decreased air entry with bilateral wheezing , no crackles  CVS: S1, S2 no murmurs, no S3.  ABD: Soft non tender. Bowel sounds normal.  Ext: No edema      Assessment & Plan:

## 2010-10-13 ENCOUNTER — Telehealth: Payer: Self-pay | Admitting: Family Medicine

## 2010-10-13 DIAGNOSIS — R509 Fever, unspecified: Secondary | ICD-10-CM

## 2010-10-13 DIAGNOSIS — R4182 Altered mental status, unspecified: Secondary | ICD-10-CM

## 2010-10-13 LAB — CBC WITH DIFFERENTIAL/PLATELET
Basophils Absolute: 0.1 10*3/uL (ref 0.0–0.1)
Basophils Relative: 1 % (ref 0–1)
Eosinophils Absolute: 0.1 10*3/uL (ref 0.0–0.7)
Eosinophils Relative: 2 % (ref 0–5)
MCH: 30.2 pg (ref 26.0–34.0)
MCV: 92.3 fL (ref 78.0–100.0)
Platelets: 255 10*3/uL (ref 150–400)
RDW: 12.3 % (ref 11.5–15.5)

## 2010-10-13 LAB — BASIC METABOLIC PANEL
Calcium: 9.3 mg/dL (ref 8.4–10.5)
Creat: 0.78 mg/dL (ref 0.50–1.35)

## 2010-10-13 NOTE — Telephone Encounter (Signed)
Labs ordered.

## 2010-10-13 NOTE — Telephone Encounter (Signed)
Addended by: Adella Hare B on: 10/13/2010 11:11 AM   Modules accepted: Orders

## 2010-10-13 NOTE — Telephone Encounter (Signed)
pls order stat cbc and diff and chem 7, dx fever, altered mental status ,wife aware

## 2010-11-01 ENCOUNTER — Encounter: Payer: Self-pay | Admitting: Family Medicine

## 2010-11-01 ENCOUNTER — Ambulatory Visit (INDEPENDENT_AMBULATORY_CARE_PROVIDER_SITE_OTHER): Payer: Managed Care, Other (non HMO) | Admitting: Family Medicine

## 2010-11-01 VITALS — BP 102/64 | HR 50 | Resp 16 | Ht 69.0 in | Wt 238.0 lb

## 2010-11-01 DIAGNOSIS — I498 Other specified cardiac arrhythmias: Secondary | ICD-10-CM

## 2010-11-01 DIAGNOSIS — G4730 Sleep apnea, unspecified: Secondary | ICD-10-CM

## 2010-11-01 DIAGNOSIS — R001 Bradycardia, unspecified: Secondary | ICD-10-CM

## 2010-11-01 DIAGNOSIS — R413 Other amnesia: Secondary | ICD-10-CM

## 2010-11-01 DIAGNOSIS — E785 Hyperlipidemia, unspecified: Secondary | ICD-10-CM

## 2010-11-01 DIAGNOSIS — J40 Bronchitis, not specified as acute or chronic: Secondary | ICD-10-CM

## 2010-11-01 MED ORDER — ERGOCALCIFEROL 1.25 MG (50000 UT) PO CAPS: 50000.0000 [IU] | ORAL_CAPSULE | ORAL | Status: DC

## 2010-11-01 MED ORDER — PREDNISONE (PAK) 5 MG PO TABS: 5.0000 mg | ORAL_TABLET | ORAL | Status: DC

## 2010-11-01 MED ORDER — METHYLPREDNISOLONE ACETATE 80 MG/ML IJ SUSP
80.0000 mg | Freq: Once | INTRAMUSCULAR | Status: AC
  Administered 2010-11-01: 80 mg via INTRAMUSCULAR

## 2010-11-01 MED ORDER — ATORVASTATIN CALCIUM 40 MG PO TABS: 40.0000 mg | ORAL_TABLET | Freq: Every day | ORAL | Status: DC

## 2010-11-01 NOTE — Progress Notes (Signed)
  Subjective:    Patient ID: Terry Love, male    DOB: [birth year], 75 y.o.   MRN: [id number]  HPI C/o snoring, fatigue, memory loss, excessive drowsiness, loss of interest, possible depression per spouse not suicidal or homicidal. Still c/o excessive cough, which is non productive, has no associated symptoms of increased nasal drainage , sore throat , fever or chills. Review of Systems See HPI Denies recent fever or chills. Denies sinus pressure, nasal congestion, ear pain or sore throat. . Denies chest pains, palpitations and leg swelling Denies abdominal pain, nausea, vomiting,diarrhea or constipation.   Denies dysuria, frequency, hesitancy or incontinence. Denies joint pain, swelling and limitation in mobility. Denies seizures, numbness, or tingling.  Denies skin break down or rash.        Objective:   Physical Exam Patient alert and oriented and in no cardiopulmonary distress.  HEENT: No facial asymmetry, EOMI, no sinus tenderness,  oropharynx pink and moist.  Neck supple no adenopathy.Neck is short and thick Chest: Clear to auscultation bilaterally.  CVS: S1, S2 no murmurs, no S3.  ABD: Soft non tender. Bowel sounds normal.  Ext: No edema  MS: Adequate ROM spine, shoulders, hips and knees.  Skin: Intact, no ulcerations or rash noted.  Psych: Good eye contact,normal  affect.not anxious or depressed appearing.  CNS: CN 2-12 intact, power, tone and sensation normal throughout.       Assessment & Plan:

## 2010-11-01 NOTE — Patient Instructions (Addendum)
F/u in 5 weeks  You will be referred for  Brain scan, sleep study and will hold on med for depression at this time  Depomedrol injection for chronic cough and prednisone sent in also

## 2010-11-02 ENCOUNTER — Encounter: Payer: Self-pay | Admitting: Family Medicine

## 2010-11-07 ENCOUNTER — Ambulatory Visit: Payer: Managed Care, Other (non HMO) | Attending: Family Medicine | Admitting: Sleep Medicine

## 2010-11-07 ENCOUNTER — Encounter: Payer: Self-pay | Admitting: Family Medicine

## 2010-11-07 DIAGNOSIS — G4700 Insomnia, unspecified: Secondary | ICD-10-CM

## 2010-11-07 DIAGNOSIS — Z6834 Body mass index (BMI) 34.0-34.9, adult: Secondary | ICD-10-CM | POA: Insufficient documentation

## 2010-11-07 DIAGNOSIS — G4733 Obstructive sleep apnea (adult) (pediatric): Secondary | ICD-10-CM | POA: Insufficient documentation

## 2010-11-08 ENCOUNTER — Encounter (INDEPENDENT_AMBULATORY_CARE_PROVIDER_SITE_OTHER): Payer: Self-pay | Admitting: *Deleted

## 2010-11-08 ENCOUNTER — Telehealth (INDEPENDENT_AMBULATORY_CARE_PROVIDER_SITE_OTHER): Payer: Self-pay | Admitting: *Deleted

## 2010-11-08 DIAGNOSIS — Z800 Family history of malignant neoplasm of digestive organs: Secondary | ICD-10-CM

## 2010-11-08 DIAGNOSIS — Z86010 Personal history of colonic polyps: Secondary | ICD-10-CM

## 2010-11-08 MED ORDER — PEG-KCL-NACL-NASULF-NA ASC-C 100 G PO SOLR: 1.0000 | Freq: Once | ORAL | Status: DC

## 2010-11-08 NOTE — Telephone Encounter (Signed)
TCS sch'd 01/25/11 @ 1:00 (12:00), asa 2 days, split movi prep inst mailed  Pt need movi prep

## 2010-11-12 DIAGNOSIS — R001 Bradycardia, unspecified: Secondary | ICD-10-CM | POA: Insufficient documentation

## 2010-11-12 NOTE — Assessment & Plan Note (Signed)
Classic symptoms, which include snoring , fatigue and excessive daytime sleepiness.Pt also has a short thick neck and crowding in the oropharynx, wil;l order sleep study

## 2010-11-12 NOTE — Assessment & Plan Note (Signed)
Improved and controlled, no med change 

## 2010-11-12 NOTE — Assessment & Plan Note (Signed)
Chronic cough, no sputum, fever or chills, allergy based, will prescribe steroid course

## 2010-11-12 NOTE — Assessment & Plan Note (Signed)
Pt with increased fatigue and chronic bradycardia, office ekg shows no change to explain symptoms

## 2010-11-12 NOTE — Assessment & Plan Note (Signed)
Concern by spouse over increased symptoms of memory loss as well as severe depression. Anhidonia and social withdrawal, possible personality changes , needs brain scan to r/o tumor , and assess for excessive changes of aging

## 2010-11-13 NOTE — Procedures (Signed)
NAME:  Terry Love, Terry Love               ACCOUNT NO.:  [id number]  MEDICAL RECORD NO.:  [id number]          PATIENT TYPE:  OUT  LOCATION:  SLEEP LAB                     FACILITY:  [facility]  PHYSICIAN:  Rogene Meth A. Gerilyn Pilgrim, M.D. DATE OF BIRTH:  [birth year]  DATE OF STUDY:  11/07/2010                           NOCTURNAL POLYSOMNOGRAM  REFERRING PHYSICIAN:  MARGARET SIMPSON  INDICATION FOR STUDY:  A 75 year old who presents with snoring, fatigue and hypersomnia.  The study has been done to evaluate for obstructive sleep apnea syndrome.  EPWORTH SLEEPINESS SCORE: 1. BMI 34.  ARCHITECTURAL SUMMARY:  The total recording time is 402 minutes.  Sleep efficiency 76%.  Sleep latency 28 minutes.  REM latency 71.5 minutes. Stage N1 18.5%, N2 59.2%, N3 4.9% and REM sleep 17.3%.  RESPIRATORY SUMMARY:  Baseline oxygen saturation is 97, lowest saturation 85.  Diagnostic AHI 7.5 and RDI 11.6.  LIMB MOVEMENT SUMMARY:  PLM index 5.3.  The patient is also noted to have phasic EMG activity throughout the recording including REM sleep.  ELECTROCARDIOGRAM SUMMARY:  Average heart rate is 49 with occasional PVCs observed.  MEDICATIONS:  Lipitor and multivitamin.   IMPRESSIONS-RECOMMENDATIONS: 1. Mild obstructive sleep apnea syndrome not requiring positive     pressure treatment. 2. Mild periodic movement disorder of sleep. 3. Phasic EMG activity.  Thank you for this referral.     Laylana Gerwig A. Gerilyn Pilgrim, M.D.    KAD/MEDQ  D:  11/12/2010 19:30:56  T:  11/13/2010 01:51:49  Job:  [id number]

## 2010-12-06 ENCOUNTER — Ambulatory Visit: Payer: Managed Care, Other (non HMO) | Admitting: Family Medicine

## 2010-12-06 LAB — HEPATIC FUNCTION PANEL
AST: 24 U/L (ref 0–37)
Albumin: 4.3 g/dL (ref 3.5–5.2)
Alkaline Phosphatase: 42 U/L (ref 39–117)
Total Bilirubin: 0.6 mg/dL (ref 0.3–1.2)
Total Protein: 6.7 g/dL (ref 6.0–8.3)

## 2010-12-06 LAB — BASIC METABOLIC PANEL
CO2: 26 mEq/L (ref 19–32)
Calcium: 9.3 mg/dL (ref 8.4–10.5)
Chloride: 106 mEq/L (ref 96–112)
Sodium: 141 mEq/L (ref 135–145)

## 2010-12-06 LAB — VITAMIN D 25 HYDROXY (VIT D DEFICIENCY, FRACTURES): Vit D, 25-Hydroxy: 30 ng/mL (ref 30–89)

## 2010-12-06 LAB — LIPID PANEL
HDL: 48 mg/dL (ref >39)
Total CHOL/HDL Ratio: 3.8 Ratio
Triglycerides: 98 mg/dL (ref <150)

## 2010-12-12 ENCOUNTER — Encounter: Payer: Self-pay | Admitting: Family Medicine

## 2010-12-13 ENCOUNTER — Ambulatory Visit (INDEPENDENT_AMBULATORY_CARE_PROVIDER_SITE_OTHER): Payer: Managed Care, Other (non HMO) | Admitting: Family Medicine

## 2010-12-13 ENCOUNTER — Encounter: Payer: Self-pay | Admitting: Family Medicine

## 2010-12-13 VITALS — BP 110/70 | HR 57 | Resp 16 | Ht 69.0 in | Wt 240.0 lb

## 2010-12-13 DIAGNOSIS — J309 Allergic rhinitis, unspecified: Secondary | ICD-10-CM

## 2010-12-13 DIAGNOSIS — R7303 Prediabetes: Secondary | ICD-10-CM

## 2010-12-13 DIAGNOSIS — R7309 Other abnormal glucose: Secondary | ICD-10-CM

## 2010-12-13 DIAGNOSIS — R319 Hematuria, unspecified: Secondary | ICD-10-CM

## 2010-12-13 DIAGNOSIS — E785 Hyperlipidemia, unspecified: Secondary | ICD-10-CM

## 2010-12-13 DIAGNOSIS — E669 Obesity, unspecified: Secondary | ICD-10-CM

## 2010-12-13 DIAGNOSIS — F322 Major depressive disorder, single episode, severe without psychotic features: Secondary | ICD-10-CM | POA: Insufficient documentation

## 2010-12-13 DIAGNOSIS — N390 Urinary tract infection, site not specified: Secondary | ICD-10-CM

## 2010-12-13 DIAGNOSIS — R5383 Other fatigue: Secondary | ICD-10-CM

## 2010-12-13 DIAGNOSIS — Z0289 Encounter for other administrative examinations: Secondary | ICD-10-CM

## 2010-12-13 DIAGNOSIS — F324 Major depressive disorder, single episode, in partial remission: Secondary | ICD-10-CM | POA: Insufficient documentation

## 2010-12-13 DIAGNOSIS — F329 Major depressive disorder, single episode, unspecified: Secondary | ICD-10-CM

## 2010-12-13 DIAGNOSIS — Z23 Encounter for immunization: Secondary | ICD-10-CM

## 2010-12-13 DIAGNOSIS — R5381 Other malaise: Secondary | ICD-10-CM

## 2010-12-13 MED ORDER — CITALOPRAM HYDROBROMIDE 10 MG PO TABS: 10.0000 mg | ORAL_TABLET | Freq: Every day | ORAL | Status: DC

## 2010-12-13 NOTE — Patient Instructions (Addendum)
F/U in  3months  It is important that you exercise regularly at least 30 minutes 5 times a week. If you develop chest pain, have severe difficulty breathing, or feel very tired, stop exercising immediately and seek medical attention   New medication  For depression.  Flu vaccine today  Increase the cholesterol med to taking every night  Fasting lipid, hepatic and HBA1C  In 3 months

## 2010-12-14 LAB — POCT URINALYSIS DIPSTICK
Bilirubin, UA: NEGATIVE
Glucose, UA: NEGATIVE
Ketones, UA: NEGATIVE
Protein, UA: NEGATIVE
Spec Grav, UA: 1.025

## 2010-12-14 MED ORDER — INFLUENZA VAC TYPES A & B PF IM SUSP: 0.5000 mL | Freq: Once | INTRAMUSCULAR | Status: DC

## 2010-12-15 ENCOUNTER — Ambulatory Visit: Payer: Managed Care, Other (non HMO) | Admitting: Family Medicine

## 2010-12-17 LAB — URINE CULTURE

## 2010-12-18 DIAGNOSIS — N390 Urinary tract infection, site not specified: Secondary | ICD-10-CM | POA: Insufficient documentation

## 2010-12-18 MED ORDER — AMPICILLIN 500 MG PO CAPS: 500.0000 mg | ORAL_CAPSULE | Freq: Three times a day (TID) | ORAL | Status: AC

## 2010-12-24 NOTE — Assessment & Plan Note (Signed)
Unchanged. Patient re-educated about  the importance of commitment to a  minimum of 150 minutes of exercise per week. The importance of healthy food choices with portion control discussed. Encouraged to start a food diary, count calories and to consider  joining a support group. Sample diet sheets offered. Goals set by the patient for the next several months.    

## 2010-12-24 NOTE — Assessment & Plan Note (Signed)
Increased symptoms of social withdrawal, not suicidal or homicidal, pt to start medication, has benefited from this in the past

## 2010-12-24 NOTE — Assessment & Plan Note (Signed)
Pt with uti, antibiotic course prescribed, ccua showed trace blood in office , culture positive for bacteria, low colony count

## 2010-12-24 NOTE — Assessment & Plan Note (Signed)
Not as well controlled on lower dose of med, low fat diet discussed and encouraged, dose to be increased also

## 2010-12-24 NOTE — Progress Notes (Signed)
  Subjective:    Patient ID: Terry Love, male    DOB: 04/29/1935, 75 y.o.   MRN: [id number]  HPI The PT is here for follow up and re-evaluation of chronic medical conditions, medication management and review of any available recent lab and radiology data.  Preventive health is updated, specifically  Cancer screening and Immunization.   Questions or concerns regarding consultations or procedures which the PT has had in the interim are  addressed. Pt had a normal sleep study since his last visit. He agrees to resume ant idepresant medication from which he has benefited ij the past. He does admit to symptoms of depression which he attempts to normalize, specifically anhedonia and social withdrawal. He denies suicidal or homicidal ideation    Review of Systems See HPI Denies recent fever or chills. Denies sinus pressure, nasal congestion, ear pain or sore throat. Denies chest congestion, productive cough or wheezing. Denies chest pains, palpitations and leg swelling Denies abdominal pain, nausea, vomiting,diarrhea or constipation.   C/o mild dysuria and  Frequency,denies  hesitancy or incontinence. Denies joint pain, swelling and limitation in mobility. Denies headaches, seizures, numbness, or tingling. . Denies skin break down or rash.       Objective:   Physical Exam Patient alert and oriented and in no cardiopulmonary distress.  HEENT: No facial asymmetry, EOMI, no sinus tenderness,  oropharynx pink and moist.  Neck supple no adenopathy.  Chest: Clear to auscultation bilaterally.  CVS: S1, S2 no murmurs, no S3.  ABD: Soft non tender. Bowel sounds normal.No  Renal angle tenderness Ext: No edema  MS: Adequate ROM spine, shoulders, hips and knees.  Skin: Intact, no ulcerations or rash noted.  Psych: Good eye contact,flat affect. Memory intact not anxious or depressed appearing.  CNS: CN 2-12 intact, power, tone and sensation normal throughout.        Assessment &  Plan:

## 2010-12-24 NOTE — Assessment & Plan Note (Signed)
Anticipate increased symptoms during the Fall, pt to use nasal spray daily

## 2011-01-08 ENCOUNTER — Telehealth: Payer: Self-pay | Admitting: Family Medicine

## 2011-01-08 NOTE — Telephone Encounter (Signed)
Pt needs nurse visit for, hearing test , urinalysis, and he needs to review answers on the fromt sheet and sign and date. This is the driver's license form he left 12/28/2010, which i saw on 01/02/2011 Pls let him know. Merry Proud will have the form

## 2011-01-08 NOTE — Telephone Encounter (Signed)
pls arrange for him to come in one day next week when I am back, still nurse visit only

## 2011-01-08 NOTE — Telephone Encounter (Signed)
He is working today till 6:00 and tomorrow he is off Wednesday

## 2011-01-10 NOTE — Telephone Encounter (Signed)
Coming in Monday evening

## 2011-01-15 ENCOUNTER — Ambulatory Visit (INDEPENDENT_AMBULATORY_CARE_PROVIDER_SITE_OTHER): Payer: Managed Care, Other (non HMO)

## 2011-01-15 DIAGNOSIS — E669 Obesity, unspecified: Secondary | ICD-10-CM

## 2011-01-17 NOTE — Progress Notes (Signed)
Had to submit urine and get hearing screen for his license form

## 2011-01-18 LAB — POCT URINALYSIS DIPSTICK
Bilirubin, UA: NEGATIVE
Glucose, UA: NEGATIVE
Ketones, UA: NEGATIVE
Leukocytes, UA: NEGATIVE

## 2011-01-23 ENCOUNTER — Other Ambulatory Visit (INDEPENDENT_AMBULATORY_CARE_PROVIDER_SITE_OTHER): Payer: Self-pay | Admitting: *Deleted

## 2011-01-23 DIAGNOSIS — Z86010 Personal history of colonic polyps: Secondary | ICD-10-CM

## 2011-01-23 DIAGNOSIS — Z800 Family history of malignant neoplasm of digestive organs: Secondary | ICD-10-CM

## 2011-01-24 MED ORDER — SODIUM CHLORIDE 0.45 % IV SOLN
Freq: Once | INTRAVENOUS | Status: AC
  Administered 2011-01-25: 1000 mL via INTRAVENOUS

## 2011-01-25 ENCOUNTER — Encounter (HOSPITAL_COMMUNITY): Payer: Self-pay | Admitting: *Deleted

## 2011-01-25 ENCOUNTER — Ambulatory Visit (HOSPITAL_COMMUNITY)
Admission: RE | Admit: 2011-01-25 | Discharge: 2011-01-25 | Disposition: A | Discharge status: Home / Self Care | Payer: Managed Care, Other (non HMO) | Source: Ambulatory Visit | Attending: Internal Medicine | Admitting: Internal Medicine

## 2011-01-25 ENCOUNTER — Encounter (HOSPITAL_COMMUNITY)
Admission: RE | Disposition: A | Discharge status: Home / Self Care | Payer: Self-pay | Source: Ambulatory Visit | Attending: Internal Medicine

## 2011-01-25 ENCOUNTER — Other Ambulatory Visit (INDEPENDENT_AMBULATORY_CARE_PROVIDER_SITE_OTHER): Payer: Self-pay | Admitting: Internal Medicine

## 2011-01-25 DIAGNOSIS — Z860100 Personal history of colon polyps, unspecified: Secondary | ICD-10-CM | POA: Insufficient documentation

## 2011-01-25 DIAGNOSIS — Z800 Family history of malignant neoplasm of digestive organs: Secondary | ICD-10-CM

## 2011-01-25 DIAGNOSIS — D126 Benign neoplasm of colon, unspecified: Secondary | ICD-10-CM

## 2011-01-25 DIAGNOSIS — K644 Residual hemorrhoidal skin tags: Secondary | ICD-10-CM | POA: Insufficient documentation

## 2011-01-25 DIAGNOSIS — K5730 Diverticulosis of large intestine without perforation or abscess without bleeding: Secondary | ICD-10-CM

## 2011-01-25 DIAGNOSIS — Z1211 Encounter for screening for malignant neoplasm of colon: Secondary | ICD-10-CM

## 2011-01-25 HISTORY — DX: Other seasonal allergic rhinitis: J30.2

## 2011-01-25 HISTORY — PX: COLONOSCOPY: SHX5424

## 2011-01-25 HISTORY — DX: Anxiety disorder, unspecified: F41.9

## 2011-01-25 HISTORY — DX: Polyp of colon: K63.5

## 2011-01-25 SURGERY — COLONOSCOPY
Anesthesia: Moderate Sedation

## 2011-01-25 MED ORDER — MEPERIDINE HCL 50 MG/ML IJ SOLN
INTRAMUSCULAR | Status: AC
  Filled 2011-01-25: qty 1

## 2011-01-25 MED ORDER — MEPERIDINE HCL 50 MG/ML IJ SOLN
INTRAMUSCULAR | Status: DC | PRN
  Administered 2011-01-25: 25 mg via INTRAVENOUS

## 2011-01-25 MED ORDER — MIDAZOLAM HCL 5 MG/5ML IJ SOLN
INTRAMUSCULAR | Status: DC | PRN
  Administered 2011-01-25: 2 mg via INTRAVENOUS

## 2011-01-25 MED ORDER — STERILE WATER FOR IRRIGATION IR SOLN
Status: DC | PRN
  Administered 2011-01-25: 13:00:00

## 2011-01-25 MED ORDER — MIDAZOLAM HCL 5 MG/5ML IJ SOLN
INTRAMUSCULAR | Status: AC
  Filled 2011-01-25: qty 10

## 2011-01-25 NOTE — Progress Notes (Signed)
Pt's resting HR 40-44 beats per minute. Dr. Karilyn Cota made aware. Pt is asymptomatic. Pt is quite active and states that he tends to have a slow resting HR. With some leg exercises pt's HR began to rise up to 50-52 beats per minute. No orders at this time. Dr. Karilyn Cota states that if HR drops below 40 beats per minute, he will consider Atropine IV. Will continue to closely monitor pt.

## 2011-01-25 NOTE — H&P (Addendum)
Terry Love is an 75 y.o. male.   Chief Complaint: Patient is here for colonoscopy. HPI: Patient is 75 year old African American male with history of colonic polyps. He is here for surveillance examination. His last exam was in June 2007. Him history is positive for colon carcinoma in his mother was 64 at the time of diagnosis. [name] denies abdominal pain change in his bowel habits or rectal bleeding.  Past Medical History  Diagnosis Date  . Bradycardia     Chronic   . Obesity   . Hyperlipidemia   . Facial tic   . Seasonal allergies   . Anxiety   . Colon polyps     Past Surgical History  Procedure Date  . Carpal tunnel  release right 2006  . Colonoscopy     Family History  Problem Relation Age of Onset  . Aneurysm Father   . Colon cancer Mother   . Stroke Brother    Social History:  reports that he has never smoked. He does not have any smokeless tobacco history on file. He reports that he drinks alcohol. He reports that he does not use illicit drugs.  Allergies: No Known Allergies  Medications Prior to Admission  Medication Dose Route Frequency Provider Last Rate Last Dose  . 0.45 % sodium chloride infusion   Intravenous Once Malissa Hippo, MD 20 mL/hr at 01/25/11 1255 1,000 mL at 01/25/11 1255  . meperidine (DEMEROL) 50 MG/ML injection           . midazolam (VERSED) 5 MG/5ML injection            Medications Prior to Admission  Medication Sig Dispense Refill  . albuterol (PROAIR HFA) 108 (90 BASE) MCG/ACT inhaler Inhale 2 puffs into the lungs 2 (two) times daily. For shortness of breath      . Ascorbic Acid (VITAMIN C) 500 MG tablet Take 500 mg by mouth daily.       Marland Kitchen aspirin (BAYER ASPIRIN EC LOW DOSE) 81 MG EC tablet Take 81 mg by mouth daily.        Marland Kitchen atorvastatin (LIPITOR) 40 MG tablet Take 1 tablet (40 mg total) by mouth at bedtime.  30 tablet  3  . calcium-vitamin D (OSCAL WITH D) 500-200 MG-UNIT per tablet Take 1 tablet by mouth 3 (three) times daily.         . ergocalciferol (VITAMIN D2) 50000 UNITS capsule Take 1 capsule (50,000 Units total) by mouth once a week.  12 capsule  1  . fish oil-omega-3 fatty acids 1000 MG capsule Take 2 g by mouth 2 (two) times daily.        Marland Kitchen FLUTICASONE PROPIONATE, NASAL, NA by Nasal route. Inhale one to two puffs per nostril once a daily for allergies       . Multiple Vitamin (MULTIVITAMIN) tablet Take 1 tablet by mouth daily. Unspecified frequency        No results found for this or any previous visit (from the past 48 hour(s)). No results found.  Review of Systems  Constitutional: Negative for weight loss.  Gastrointestinal: Negative for abdominal pain, diarrhea, constipation, blood in stool and melena.    Blood pressure 133/69, pulse 45, temperature 98 F (36.7 C), temperature source Oral, resp. rate 18, height 5\' 10"  (1.778 m), weight 240 lb (108.863 kg), SpO2 97.00%. Physical Exam  Constitutional: He appears well-developed and well-nourished.  HENT:  Mouth/Throat: Oropharynx is clear and moist.  Eyes: Conjunctivae are normal. No scleral icterus.  Neck: No thyromegaly present.  Cardiovascular: Normal rate, regular rhythm and normal heart sounds.        Slow heart rate. His sinus bradycardia on the monitor. Had a been up to 52 with  minimal physical activity  Respiratory: Breath sounds normal.  GI: Soft. Bowel sounds are normal. He exhibits no distension and no mass. There is no tenderness.  Musculoskeletal: He exhibits no edema.  Lymphadenopathy:    He has no cervical adenopathy.  Neurological: He is alert.  Skin: Skin is warm and dry.     Assessment/Plan History of colonic polyps and family history of colon carcinoma. Surveillance colonoscopy.  TerryNAJEEB Love 01/25/2011, 1:12 PM

## 2011-01-25 NOTE — Op Note (Addendum)
COLONOSCOPY PROCEDURE REPORT  PATIENT:  Terry Love  MR#:  [id number] Birthdate:  1935-12-19, 76 y.o., male Endoscopist:  Dr. Malissa Hippo, MD Referred By:  Dr. [name], MD.  Procedure Date: 01/25/2011  Procedure:   Colonoscopy.  Indications:  Patient is a very healthy 75 year old African male who is undergoing colonoscopy is a family history of colon carcinoma in his mother. She was however 82 at the time of diagnosis. He did have a polyp removed 10 years ago.  Informed Consent:  The risks, benefits, alternatives & imponderables which include, but are not limited to, bleeding, infection, perforation, drug reaction and potential missed lesion have been reviewed.  The potential for biopsy, lesion removal, etc. Have also been discussed.  Questions have been answered.  All parties agreeable.  Please see history & physical in medical record for more information.  Medications:  Demerol 25 mg IV Versed 2 mg IV  Description of procedure:  After a digital rectal exam was performed, that colonoscope was advanced from the anus through the rectum and colon to the area of the cecum, ileocecal valve and appendiceal orifice. The cecum was deeply intubated. These structures were well-seen and photographed for the record. From the level of the cecum and ileocecal valve, the scope was slowly and cautiously withdrawn. The mucosal surfaces were carefully surveyed utilizing scope tip to flexion to facilitate fold flattening as needed. The scope was pulled down into the rectum where a thorough exam including retroflexion was performed.  Findings:   Prep excellent 2 small polyps at transverse colon which are ablated via cold biopsy and submitted in one container. Scattered diverticula throughout the colon but predominantly in the sigmoid colon. Hemorrhoids below the dentate line.  Therapeutic/Diagnostic Maneuvers Performed:  See above  Complications:  None  Cecal Withdrawal Time:  10  minutes  Impression:  Examination performed to cecum. Pan colonic diverticulosis. 2 small polyps ablated via cold biopsy from transverse colon. External hemorrhoids. Recommendations:  Standard instructions given. I will be contacting patient results of biopsy. Consider next exam in 5 years even family history.  Terry Love  01/25/2011 1:36 PM  CC: Dr. [name], MD, MD & Dr. Bonnetta Barry ref. provider found

## 2011-02-01 ENCOUNTER — Encounter (HOSPITAL_COMMUNITY): Payer: Self-pay | Admitting: Internal Medicine

## 2011-02-02 ENCOUNTER — Encounter (INDEPENDENT_AMBULATORY_CARE_PROVIDER_SITE_OTHER): Payer: Self-pay | Admitting: *Deleted

## 2011-03-09 ENCOUNTER — Other Ambulatory Visit: Payer: Self-pay

## 2011-03-09 MED ORDER — ERGOCALCIFEROL 1.25 MG (50000 UT) PO CAPS: 50000.0000 [IU] | ORAL_CAPSULE | ORAL | Status: DC

## 2011-03-14 ENCOUNTER — Encounter: Payer: Self-pay | Admitting: Family Medicine

## 2011-03-15 ENCOUNTER — Ambulatory Visit: Payer: Managed Care, Other (non HMO) | Admitting: Family Medicine

## 2011-03-16 ENCOUNTER — Other Ambulatory Visit: Payer: Self-pay | Admitting: Family Medicine

## 2011-03-16 LAB — LIPID PANEL
HDL: 51 mg/dL (ref >39)
Triglycerides: 52 mg/dL (ref <150)

## 2011-03-16 LAB — HEPATIC FUNCTION PANEL
AST: 22 U/L (ref 0–37)
Albumin: 4.3 g/dL (ref 3.5–5.2)
Alkaline Phosphatase: 42 U/L (ref 39–117)
Total Bilirubin: 0.7 mg/dL (ref 0.3–1.2)
Total Protein: 7.1 g/dL (ref 6.0–8.3)

## 2011-03-16 LAB — BASIC METABOLIC PANEL
CO2: 29 mEq/L (ref 19–32)
Calcium: 9.8 mg/dL (ref 8.4–10.5)
Creat: 0.77 mg/dL (ref 0.50–1.35)

## 2011-03-18 LAB — URINE CULTURE
Colony Count: NO GROWTH
Organism ID, Bacteria: NO GROWTH

## 2011-03-19 ENCOUNTER — Ambulatory Visit (INDEPENDENT_AMBULATORY_CARE_PROVIDER_SITE_OTHER): Payer: Managed Care, Other (non HMO) | Admitting: Family Medicine

## 2011-03-19 ENCOUNTER — Encounter: Payer: Self-pay | Admitting: Family Medicine

## 2011-03-19 VITALS — BP 104/62 | HR 63 | Resp 16 | Ht 69.0 in | Wt 234.4 lb

## 2011-03-19 DIAGNOSIS — R5383 Other fatigue: Secondary | ICD-10-CM

## 2011-03-19 DIAGNOSIS — F329 Major depressive disorder, single episode, unspecified: Secondary | ICD-10-CM

## 2011-03-19 DIAGNOSIS — R5381 Other malaise: Secondary | ICD-10-CM

## 2011-03-19 DIAGNOSIS — E669 Obesity, unspecified: Secondary | ICD-10-CM

## 2011-03-19 DIAGNOSIS — E785 Hyperlipidemia, unspecified: Secondary | ICD-10-CM

## 2011-03-19 MED ORDER — ATORVASTATIN CALCIUM 40 MG PO TABS: 40.0000 mg | ORAL_TABLET | Freq: Every day | ORAL | Status: DC

## 2011-03-19 MED ORDER — CITALOPRAM HYDROBROMIDE 20 MG PO TABS: 20.0000 mg | ORAL_TABLET | Freq: Every day | ORAL | Status: DC

## 2011-03-19 NOTE — Assessment & Plan Note (Signed)
Improved, will inc dose of med

## 2011-03-19 NOTE — Patient Instructions (Addendum)
F/u first week in May.  Congrats on excellent blood work, and weight loss.  Please continue healthy eating and commit to exercise at least 5 days per week  Dose increase on citalopram.May take TWO 10mg  tablets once daily until done  Take once weekly vit D until done along with one calcium with D tablet that you have.  After this just buy oTc calcium with D gel capsule 1200mg /1000IU once daily  Also recommended asprin 81 mg one daily, and ONE multivitamin eg centrum once daily   Fasting lipid, hepatic and vit D level in early May before visit, also blood sugar

## 2011-03-19 NOTE — Progress Notes (Signed)
  Subjective:    Patient ID: Terry Love, male    DOB: 01/18/1936, 75 y.o.   MRN: [id number]  HPI The PT is here for follow up and re-evaluation of chronic medical conditions, medication management and review of any available recent lab and radiology data.  Preventive health is updated, specifically  Cancer screening and Immunization.   Questions or concerns regarding consultations or procedures which the PT has had in the interim are  addressed. The PT denies any adverse reactions to current medications since the last visit.  There are no new concerns.  There are no specific complaints       Review of Systems See HPI Denies recent fever or chills. Denies sinus pressure, nasal congestion, ear pain or sore throat. Denies chest congestion, productive cough or wheezing. Denies chest pains, palpitations and leg swelling Denies abdominal pain, nausea, vomiting,diarrhea or constipation.   Denies dysuria, frequency, hesitancy or incontinence. Denies joint pain, swelling and limitation in mobility. Denies headaches, seizures, numbness, or tingling. Denies depression, anxiety or insomnia. Denies skin break down or rash.        Objective:   Physical Exam Patient alert and oriented and in no cardiopulmonary distress.  HEENT: No facial asymmetry, EOMI, no sinus tenderness,  oropharynx pink and moist.  Neck supple no adenopathy.  Chest: Clear to auscultation bilaterally.  CVS: S1, S2 no murmurs, no S3.  ABD: Soft non tender. Bowel sounds normal.  Ext: No edema  MS: Adequate ROM spine, shoulders, hips and knees.  Skin: Intact, no ulcerations or rash noted.  Psych: Good eye contact, normal affect. Memory intact not anxious or depressed appearing.  CNS: CN 2-12 intact, power, tone and sensation normal throughout.        Assessment & Plan:

## 2011-03-21 NOTE — Assessment & Plan Note (Signed)
Improved. Pt applauded on succesful weight loss through lifestyle change, and encouraged to continue same. Weight loss goal set for the next several months.  

## 2011-03-21 NOTE — Assessment & Plan Note (Signed)
Controlled, no change in medication  

## 2011-08-07 LAB — HEMOGLOBIN A1C
Hgb A1c MFr Bld: 5.6 % (ref <5.7)
Mean Plasma Glucose: 114 mg/dL (ref <117)

## 2011-08-07 LAB — HEPATIC FUNCTION PANEL
Alkaline Phosphatase: 44 U/L (ref 39–117)
Bilirubin, Direct: 0.2 mg/dL (ref 0.0–0.3)
Indirect Bilirubin: 0.5 mg/dL (ref 0.0–0.9)
Total Bilirubin: 0.7 mg/dL (ref 0.3–1.2)

## 2011-08-07 LAB — VITAMIN D 25 HYDROXY (VIT D DEFICIENCY, FRACTURES): Vit D, 25-Hydroxy: 33 ng/mL (ref 30–89)

## 2011-08-07 LAB — LIPID PANEL
HDL: 44 mg/dL (ref >39)
LDL Cholesterol: 63 mg/dL (ref 0–99)

## 2011-08-17 ENCOUNTER — Encounter: Payer: Self-pay | Admitting: Family Medicine

## 2011-08-17 ENCOUNTER — Ambulatory Visit (INDEPENDENT_AMBULATORY_CARE_PROVIDER_SITE_OTHER): Payer: Medicare Other | Admitting: Family Medicine

## 2011-08-17 VITALS — BP 100/64 | HR 50 | Resp 15 | Ht 69.0 in | Wt 230.4 lb

## 2011-08-17 DIAGNOSIS — J309 Allergic rhinitis, unspecified: Secondary | ICD-10-CM

## 2011-08-17 DIAGNOSIS — Z125 Encounter for screening for malignant neoplasm of prostate: Secondary | ICD-10-CM

## 2011-08-17 DIAGNOSIS — E785 Hyperlipidemia, unspecified: Secondary | ICD-10-CM

## 2011-08-17 DIAGNOSIS — R5383 Other fatigue: Secondary | ICD-10-CM

## 2011-08-17 DIAGNOSIS — E669 Obesity, unspecified: Secondary | ICD-10-CM

## 2011-08-17 DIAGNOSIS — R5381 Other malaise: Secondary | ICD-10-CM

## 2011-08-17 DIAGNOSIS — F329 Major depressive disorder, single episode, unspecified: Secondary | ICD-10-CM

## 2011-08-17 MED ORDER — CITALOPRAM HYDROBROMIDE 20 MG PO TABS: 20.0000 mg | ORAL_TABLET | Freq: Every day | ORAL | Status: DC

## 2011-08-17 NOTE — Patient Instructions (Signed)
Annual exam end October  Please call if you need me before.  Call in September for flu vaccine please  Congrats on excellent labs , excellent lifestyle  Changes with sucesful weight loss.   Reduce lipitor to 5 nights per week, Monday through Friday please  Fasting cbc, chem 7, lipid, hepatic, TSh, PSA in October , just before next visit

## 2011-08-17 NOTE — Progress Notes (Signed)
  Subjective:    Patient ID: Terry Love, male    DOB: November 17, 1935, 76 y.o.   MRN: [id number]  HPI The PT is here for follow up and re-evaluation of chronic medical conditions, medication management and review of any available recent lab and radiology data.  Preventive health is updated, specifically  Cancer screening and Immunization.   Questions or concerns regarding consultations or procedures which the PT has had in the interim are  addressed. The PT denies any adverse reactions to current medications since the last visit.  There are no new concerns.  C/o right posterior thigh pain after driving for a long period, alleve relieved his discomfort, I advised this is safe to do     Review of Systems See HPI Denies recent fever or chills. Denies sinus pressure, nasal congestion, ear pain or sore throat. Denies chest congestion, productive cough or wheezing. Denies chest pains, palpitations and leg swelling Denies abdominal pain, nausea, vomiting,diarrhea or constipation.   Denies dysuria, frequency, hesitancy or incontinence. Denies headaches, seizures, numbness, or tingling. Denies depression, anxiety or insomnia. Denies skin break down or rash.        Objective:   Physical Exam Patient alert and oriented and in no cardiopulmonary distress.  HEENT: No facial asymmetry, EOMI, no sinus tenderness,  oropharynx pink and moist.  Neck supple no adenopathy.  Chest: Clear to auscultation bilaterally.  CVS: S1, S2 no murmurs, no S3.  ABD: Soft non tender. Bowel sounds normal.  Ext: No edema  MS: Adequate ROM spine, shoulders, hips and knees.  Skin: Intact, no ulcerations or rash noted.  Psych: Good eye contact, normal affect. Memory intact not anxious or depressed appearing.  CNS: CN 2-12 intact, power, tone and sensation normal throughout.        Assessment & Plan:

## 2011-08-18 NOTE — Assessment & Plan Note (Signed)
Controlled, no change in medication  

## 2011-08-18 NOTE — Assessment & Plan Note (Signed)
Improved, reduce medication to 5 days per week.  Hyperlipidemia:Low fat diet discussed and encouraged.

## 2011-08-18 NOTE — Assessment & Plan Note (Signed)
Improved. Pt applauded on succesful weight loss through lifestyle change, and encouraged to continue same. Weight loss goal set for the next several months.  

## 2012-01-24 ENCOUNTER — Encounter: Payer: Managed Care, Other (non HMO) | Admitting: Family Medicine

## 2012-01-24 ENCOUNTER — Other Ambulatory Visit: Payer: Self-pay | Admitting: Family Medicine

## 2012-01-24 DIAGNOSIS — R5381 Other malaise: Secondary | ICD-10-CM | POA: Diagnosis not present

## 2012-01-24 DIAGNOSIS — Z125 Encounter for screening for malignant neoplasm of prostate: Secondary | ICD-10-CM | POA: Diagnosis not present

## 2012-01-24 DIAGNOSIS — D649 Anemia, unspecified: Secondary | ICD-10-CM | POA: Diagnosis not present

## 2012-01-24 DIAGNOSIS — E785 Hyperlipidemia, unspecified: Secondary | ICD-10-CM | POA: Diagnosis not present

## 2012-01-25 DIAGNOSIS — D649 Anemia, unspecified: Secondary | ICD-10-CM | POA: Insufficient documentation

## 2012-01-25 LAB — BASIC METABOLIC PANEL
BUN: 13 mg/dL (ref 6–23)
CO2: 26 mEq/L (ref 19–32)
Calcium: 9.2 mg/dL (ref 8.4–10.5)
Glucose, Bld: 93 mg/dL (ref 70–99)
Sodium: 142 mEq/L (ref 135–145)

## 2012-01-25 LAB — CBC WITH DIFFERENTIAL/PLATELET
Basophils Relative: 1 % (ref 0–1)
Eosinophils Absolute: 0.3 10*3/uL (ref 0.0–0.7)
HCT: 38.9 % — ABNORMAL LOW (ref 39.0–52.0)
Hemoglobin: 12.6 g/dL — ABNORMAL LOW (ref 13.0–17.0)
Lymphs Abs: 1.9 10*3/uL (ref 0.7–4.0)
MCH: 29.9 pg (ref 26.0–34.0)
MCHC: 32.4 g/dL (ref 30.0–36.0)
MCV: 92.2 fL (ref 78.0–100.0)
Monocytes Absolute: 0.5 10*3/uL (ref 0.1–1.0)
Monocytes Relative: 9 % (ref 3–12)
Neutrophils Relative %: 49 % (ref 43–77)
RBC: 4.22 MIL/uL (ref 4.22–5.81)

## 2012-01-25 LAB — LIPID PANEL
Cholesterol: 138 mg/dL (ref 0–200)
Total CHOL/HDL Ratio: 2.6 Ratio
VLDL: 8 mg/dL (ref 0–40)

## 2012-01-25 LAB — TSH: TSH: 0.994 u[IU]/mL (ref 0.350–4.500)

## 2012-01-25 LAB — HEPATIC FUNCTION PANEL
AST: 27 U/L (ref 0–37)
Bilirubin, Direct: 0.2 mg/dL (ref 0.0–0.3)
Indirect Bilirubin: 0.7 mg/dL (ref 0.0–0.9)
Total Bilirubin: 0.9 mg/dL (ref 0.3–1.2)

## 2012-01-25 LAB — PSA, MEDICARE: PSA: 0.12 ng/mL (ref <=4.00)

## 2012-01-25 LAB — FERRITIN: Ferritin: 221 ng/mL (ref 22–322)

## 2012-01-31 DIAGNOSIS — Z23 Encounter for immunization: Secondary | ICD-10-CM | POA: Diagnosis not present

## 2012-02-04 ENCOUNTER — Other Ambulatory Visit: Payer: Self-pay | Admitting: Family Medicine

## 2012-02-29 ENCOUNTER — Telehealth: Payer: Self-pay | Admitting: Family Medicine

## 2012-02-29 MED ORDER — ATORVASTATIN CALCIUM 40 MG PO TABS: 40.0000 mg | ORAL_TABLET | Freq: Every day | ORAL | Status: DC

## 2012-02-29 NOTE — Telephone Encounter (Signed)
Sent to [facility] 

## 2012-03-31 ENCOUNTER — Ambulatory Visit (INDEPENDENT_AMBULATORY_CARE_PROVIDER_SITE_OTHER): Payer: Medicare Other | Admitting: Family Medicine

## 2012-03-31 ENCOUNTER — Encounter: Payer: Self-pay | Admitting: Family Medicine

## 2012-03-31 VITALS — BP 124/82 | HR 54 | Resp 18 | Ht 69.0 in | Wt 229.0 lb

## 2012-03-31 DIAGNOSIS — E785 Hyperlipidemia, unspecified: Secondary | ICD-10-CM | POA: Diagnosis not present

## 2012-03-31 DIAGNOSIS — Z0000 Encounter for general adult medical examination without abnormal findings: Secondary | ICD-10-CM | POA: Diagnosis not present

## 2012-03-31 DIAGNOSIS — Z1211 Encounter for screening for malignant neoplasm of colon: Secondary | ICD-10-CM | POA: Diagnosis not present

## 2012-03-31 DIAGNOSIS — D649 Anemia, unspecified: Secondary | ICD-10-CM | POA: Diagnosis not present

## 2012-03-31 DIAGNOSIS — R413 Other amnesia: Secondary | ICD-10-CM | POA: Diagnosis not present

## 2012-03-31 NOTE — Patient Instructions (Addendum)
Annual wellness in 5.5 month  Congrats on labs , they are excellent  It is important that you exercise regularly at least 45  minutes 5 times a week. If you develop chest pain, have severe difficulty breathing, or feel very tired, stop exercising immediately and seek medical attention    Weight loss goal of 3 to 4 pounds per month   No med changes at this time  Iron and B12 levels today to eval anemia  Rectal exam today  Fasting lipid, hepatic and CBC in 5.5 month

## 2012-03-31 NOTE — Progress Notes (Signed)
  Subjective:    Patient ID: Terry Love, male    DOB: 07-Feb-1936, 76 y.o.   MRN: [id number]  HPI The PT is here for annual exam and re-evaluation of chronic medical conditions, medication management and review of any available recent lab and radiology data.  Preventive health is updated, specifically  Cancer screening and Immunization.   The PT denies any adverse reactions to current medications since the last visit.  There are no new concerns.  There are no specific complaints  Mr Ratay continues to focus on healthy diet and regular physical activity to improve his health, with success      Review of Systems See HPI Denies recent fever or chills. Denies sinus pressure, nasal congestion, ear pain or sore throat. Denies chest congestion, productive cough or wheezing. Denies chest pains, palpitations and leg swelling Denies abdominal pain, nausea, vomiting,diarrhea or constipation.   Denies dysuria, frequency, hesitancy or incontinence. Denies joint pain, swelling and limitation in mobility. Denies headaches, seizures, numbness, or tingling. Denies depression, anxiety or insomnia. Denies skin break down or rash.        Objective:   Physical Exam Pleasant well nourished male, alert and oriented x 3, in no cardio-pulmonary distress. Afebrile. HEENT No facial trauma or asymetry. Sinuses non tender. EOMI, PERTL, fundoscopic exam is negative for hemorhages or exudates. External ears normal, tympanic membranes clear. Oropharynx moist, no exudate, good dentition. Neck: supple, no adenopathy,JVD or thyromegaly.No bruits.  Chest: Clear to ascultation bilaterally.No crackles or wheezes. Non tender to palpation  Breast: No asymetry,no masses. No nipple discharge or inversion. No axillary or supraclavicular adenopathy  Cardiovascular system; Heart sounds normal,  S1 and  S2 ,no S3.  No murmur, or thrill. Apical beat not displaced Peripheral pulses  normal.  Abdomen: Soft, non tender, no organomegaly or masses. No bruits. Bowel sounds normal. No guarding, tenderness or rebound.  Rectal:  No mass. guaiac negative stool. Prostate smooth and firm   Musculoskeletal exam: Full ROM of spine, hips , shoulders and knees. No deformity ,swelling or crepitus noted. No muscle wasting or atrophy.   Neurologic: Cranial nerves 2 to 12 intact. Power, tone ,sensation and reflexes normal throughout. No disturbance in gait. No tremor.  Skin: Intact, no ulceration, erythema , scaling or rash noted. Pigmentation normal throughout  Psych; Normal mood and affect. Judgement and concentration normal         Assessment & Plan:

## 2012-04-13 DIAGNOSIS — Z0000 Encounter for general adult medical examination without abnormal findings: Secondary | ICD-10-CM | POA: Insufficient documentation

## 2012-04-13 NOTE — Assessment & Plan Note (Signed)
Annual exam as documented. Reviewed lifestyle changes to which the pt is already comited, to improve his health and promote weight loss which is going along very well. He is up to date in all immunization and cancer screening. His chronic condition of hyperlipidemia is controlled well

## 2012-04-16 ENCOUNTER — Other Ambulatory Visit: Payer: Self-pay

## 2012-04-16 MED ORDER — CITALOPRAM HYDROBROMIDE 20 MG PO TABS: ORAL_TABLET | ORAL | Status: DC

## 2012-08-25 DIAGNOSIS — E785 Hyperlipidemia, unspecified: Secondary | ICD-10-CM | POA: Diagnosis not present

## 2012-08-25 DIAGNOSIS — D649 Anemia, unspecified: Secondary | ICD-10-CM | POA: Diagnosis not present

## 2012-08-25 LAB — CBC
HCT: 38.8 % — ABNORMAL LOW (ref 39.0–52.0)
Hemoglobin: 12.9 g/dL — ABNORMAL LOW (ref 13.0–17.0)
MCH: 29.3 pg (ref 26.0–34.0)
MCHC: 33.2 g/dL (ref 30.0–36.0)
MCV: 88 fL (ref 78.0–100.0)
RBC: 4.41 MIL/uL (ref 4.22–5.81)

## 2012-08-25 LAB — LIPID PANEL
Cholesterol: 133 mg/dL (ref 0–200)
Triglycerides: 76 mg/dL (ref <150)

## 2012-08-25 LAB — HEPATIC FUNCTION PANEL
ALT: 18 U/L (ref 0–53)
AST: 26 U/L (ref 0–37)
Albumin: 4.1 g/dL (ref 3.5–5.2)
Alkaline Phosphatase: 46 U/L (ref 39–117)
Total Protein: 6.7 g/dL (ref 6.0–8.3)

## 2012-08-28 ENCOUNTER — Other Ambulatory Visit: Payer: Self-pay | Admitting: Family Medicine

## 2012-09-02 ENCOUNTER — Encounter: Payer: Self-pay | Admitting: Family Medicine

## 2012-09-02 ENCOUNTER — Ambulatory Visit (INDEPENDENT_AMBULATORY_CARE_PROVIDER_SITE_OTHER): Payer: Medicare Other | Admitting: Family Medicine

## 2012-09-02 VITALS — BP 118/74 | HR 56 | Resp 18 | Ht 69.0 in | Wt 228.0 lb

## 2012-09-02 DIAGNOSIS — E785 Hyperlipidemia, unspecified: Secondary | ICD-10-CM | POA: Diagnosis not present

## 2012-09-02 DIAGNOSIS — E669 Obesity, unspecified: Secondary | ICD-10-CM

## 2012-09-02 DIAGNOSIS — F17200 Nicotine dependence, unspecified, uncomplicated: Secondary | ICD-10-CM

## 2012-09-02 DIAGNOSIS — F329 Major depressive disorder, single episode, unspecified: Secondary | ICD-10-CM | POA: Diagnosis not present

## 2012-09-02 DIAGNOSIS — F32A Depression, unspecified: Secondary | ICD-10-CM

## 2012-09-02 DIAGNOSIS — H9190 Unspecified hearing loss, unspecified ear: Secondary | ICD-10-CM

## 2012-09-02 DIAGNOSIS — Z125 Encounter for screening for malignant neoplasm of prostate: Secondary | ICD-10-CM

## 2012-09-02 DIAGNOSIS — D649 Anemia, unspecified: Secondary | ICD-10-CM

## 2012-09-02 NOTE — Progress Notes (Signed)
  Subjective:    Patient ID: Terry Love, male    DOB: [birth year], 77 y.o.   MRN: [id number]  HPI The PT is here for follow up and re-evaluation of chronic medical conditions, medication management and review of any available recent lab and radiology data. Has information from recent health screen which has excellent results overall Preventive health is updated, specifically  Cancer screening and Immunization.   The PT denies any adverse reactions to current medications since the last visit.  There are no new concerns.  There are no specific complaints       Review of Systems See HPI Denies recent fever or chills. Denies sinus pressure, nasal congestion, ear pain or sore throat. Denies chest congestion, productive cough or wheezing. Denies chest pains, palpitations and leg swelling Denies abdominal pain, nausea, vomiting,diarrhea or constipation.   Denies dysuria, frequency, hesitancy or incontinence. Denies joint pain, swelling and limitation in mobility. Denies headaches, seizures, numbness, or tingling. Denies uncontrolled  depression, anxiety or insomnia. Denies skin break down or rash.        Objective:   Physical Exam Patient alert and oriented and in no cardiopulmonary distress.  HEENT: No facial asymmetry, EOMI, no sinus tenderness,  oropharynx pink and moist.  Neck supple no adenopathy.  Chest: Clear to auscultation bilaterally.  CVS: S1, S2 no murmurs, no S3.  ABD: Soft non tender. Bowel sounds normal.  Ext: No edema  MS: Adequate ROM spine, shoulders, hips and knees.  Skin: Intact, no ulcerations or rash noted.  Psych: Good eye contact, normal affect. Memory intact not anxious or depressed appearing.  CNS: CN 2-12 intact, power, tone and sensation normal throughout.        Assessment & Plan:

## 2012-09-02 NOTE — Patient Instructions (Addendum)
F/u in 6 month, call if you need me before  Labs are excellent, your blood sugar and cholesterol are normal  Please commit to 30 minutes of physical activity EVERY DAY   Fasting lipid, cmp, TSH, pSA and CBC in 6 month   PLEASE reduce citalopram to HALF tablet once daily, call if you do not continue to feel as well, I will resume old dose   Weight loss goal of 6 to 8 pounds  No need to continue vitamin  C every day since you take 1 multivitamin daily  Please commit to discontinuing all tobacco use

## 2012-09-06 ENCOUNTER — Encounter: Payer: Self-pay | Admitting: Family Medicine

## 2012-09-06 NOTE — Assessment & Plan Note (Signed)
Improved, dose reduction in lexapro Regular exercise encouraged

## 2012-09-06 NOTE — Assessment & Plan Note (Signed)
Controlled, no change in medication Hyperlipidemia:Low fat diet discussed and encouraged.  \ 

## 2012-09-06 NOTE — Assessment & Plan Note (Signed)

## 2012-09-06 NOTE — Assessment & Plan Note (Signed)
Has hearing aids, does not wear them often, encouraged to do so

## 2012-09-06 NOTE — Assessment & Plan Note (Signed)
Improved. Pt applauded on succesful weight loss through lifestyle change, and encouraged to continue same. Weight loss goal set for the next several months.  

## 2012-11-24 ENCOUNTER — Encounter: Payer: Self-pay | Admitting: Family Medicine

## 2012-11-24 ENCOUNTER — Ambulatory Visit (INDEPENDENT_AMBULATORY_CARE_PROVIDER_SITE_OTHER): Payer: Medicare Other | Admitting: Family Medicine

## 2012-11-24 VITALS — BP 122/70 | HR 60 | Resp 18 | Ht 69.0 in | Wt 235.0 lb

## 2012-11-24 DIAGNOSIS — Z0289 Encounter for other administrative examinations: Secondary | ICD-10-CM | POA: Diagnosis not present

## 2012-11-24 DIAGNOSIS — F329 Major depressive disorder, single episode, unspecified: Secondary | ICD-10-CM | POA: Diagnosis not present

## 2012-11-24 DIAGNOSIS — F32A Depression, unspecified: Secondary | ICD-10-CM

## 2012-11-24 DIAGNOSIS — R001 Bradycardia, unspecified: Secondary | ICD-10-CM

## 2012-11-24 DIAGNOSIS — F17200 Nicotine dependence, unspecified, uncomplicated: Secondary | ICD-10-CM

## 2012-11-24 DIAGNOSIS — I498 Other specified cardiac arrhythmias: Secondary | ICD-10-CM | POA: Diagnosis not present

## 2012-11-24 DIAGNOSIS — Z024 Encounter for examination for driving license: Secondary | ICD-10-CM

## 2012-11-24 LAB — POCT URINALYSIS DIPSTICK
Leukocytes, UA: NEGATIVE
Nitrite, UA: NEGATIVE
Protein, UA: NEGATIVE
Urobilinogen, UA: 0.2
pH, UA: 7

## 2012-11-24 MED ORDER — CITALOPRAM HYDROBROMIDE 10 MG PO TABS: 10.0000 mg | ORAL_TABLET | Freq: Every day | ORAL | Status: DC

## 2012-11-24 NOTE — Assessment & Plan Note (Signed)
Exam history and physical as documented on form Pt granted 2 year certificate based on exam. He is encouraged to return to [place] for hearing aid eval, he does have some hearing loss, but was able  To hear a forced whisper form a 6 ft distance in both ears Urinalysis, blood pressure and heart rate are normal

## 2012-11-24 NOTE — Assessment & Plan Note (Signed)
Improved, pt on reduced med dose, plan is to quit over the next 6 to 12 month period , if able

## 2012-11-24 NOTE — Patient Instructions (Addendum)
F/u in October , as before  Fasting labs before October visit

## 2012-11-24 NOTE — Assessment & Plan Note (Signed)
Hs had multiple cardiology evaluations.  Heart rate at visit today is 60 and regular  Pt is comited to daily exercise and healthy diet

## 2012-11-24 NOTE — Progress Notes (Signed)
  Subjective:    Patient ID: Terry Love, male    DOB: [birth year], 77 y.o.   MRN: [id number]  HPI Pt in for CDL exam The details of history and exam are recorded on a paper form which will be scanned into his permanent record. The form will also be kept in the office for a 3 year period   Review of Systems     Objective:   Physical Exam        Assessment & Plan:

## 2012-11-24 NOTE — Assessment & Plan Note (Signed)
Quit in past approx 2 month

## 2012-12-01 ENCOUNTER — Telehealth: Payer: Self-pay | Admitting: Family Medicine

## 2012-12-01 NOTE — Telephone Encounter (Signed)
Called and left message with patient notifying him that the card is now replaced by a similar form.   This was attached to the front of his packet.

## 2013-01-01 DIAGNOSIS — Z23 Encounter for immunization: Secondary | ICD-10-CM | POA: Diagnosis not present

## 2013-01-23 ENCOUNTER — Other Ambulatory Visit: Payer: Self-pay | Admitting: Family Medicine

## 2013-01-23 DIAGNOSIS — E785 Hyperlipidemia, unspecified: Secondary | ICD-10-CM | POA: Diagnosis not present

## 2013-01-23 DIAGNOSIS — D649 Anemia, unspecified: Secondary | ICD-10-CM | POA: Diagnosis not present

## 2013-01-23 DIAGNOSIS — Z125 Encounter for screening for malignant neoplasm of prostate: Secondary | ICD-10-CM | POA: Diagnosis not present

## 2013-01-23 DIAGNOSIS — E669 Obesity, unspecified: Secondary | ICD-10-CM | POA: Diagnosis not present

## 2013-01-23 LAB — CBC
MCHC: 33.8 g/dL (ref 30.0–36.0)
RDW: 12.5 % (ref 11.5–15.5)

## 2013-01-23 LAB — COMPREHENSIVE METABOLIC PANEL
AST: 25 U/L (ref 0–37)
Albumin: 4.1 g/dL (ref 3.5–5.2)
Alkaline Phosphatase: 42 U/L (ref 39–117)
Potassium: 4.1 mEq/L (ref 3.5–5.3)
Sodium: 139 mEq/L (ref 135–145)
Total Protein: 6.7 g/dL (ref 6.0–8.3)

## 2013-01-23 LAB — LIPID PANEL
LDL Cholesterol: 72 mg/dL (ref 0–99)
Total CHOL/HDL Ratio: 2.9 Ratio
Triglycerides: 77 mg/dL (ref <150)
VLDL: 15 mg/dL (ref 0–40)

## 2013-01-24 LAB — PSA, MEDICARE: PSA: 0.1 ng/mL (ref <=4.00)

## 2013-02-03 ENCOUNTER — Ambulatory Visit (INDEPENDENT_AMBULATORY_CARE_PROVIDER_SITE_OTHER): Payer: Medicare Other | Admitting: Family Medicine

## 2013-02-03 ENCOUNTER — Encounter (INDEPENDENT_AMBULATORY_CARE_PROVIDER_SITE_OTHER): Payer: Self-pay

## 2013-02-03 ENCOUNTER — Encounter: Payer: Self-pay | Admitting: Family Medicine

## 2013-02-03 VITALS — BP 120/70 | HR 52 | Resp 16 | Ht 69.0 in | Wt 236.4 lb

## 2013-02-03 DIAGNOSIS — E785 Hyperlipidemia, unspecified: Secondary | ICD-10-CM | POA: Diagnosis not present

## 2013-02-03 DIAGNOSIS — I498 Other specified cardiac arrhythmias: Secondary | ICD-10-CM

## 2013-02-03 DIAGNOSIS — F329 Major depressive disorder, single episode, unspecified: Secondary | ICD-10-CM

## 2013-02-03 DIAGNOSIS — E669 Obesity, unspecified: Secondary | ICD-10-CM | POA: Diagnosis not present

## 2013-02-03 DIAGNOSIS — F3289 Other specified depressive episodes: Secondary | ICD-10-CM

## 2013-02-03 DIAGNOSIS — F17200 Nicotine dependence, unspecified, uncomplicated: Secondary | ICD-10-CM | POA: Diagnosis not present

## 2013-02-03 DIAGNOSIS — H9190 Unspecified hearing loss, unspecified ear: Secondary | ICD-10-CM

## 2013-02-03 MED ORDER — CITALOPRAM HYDROBROMIDE 20 MG PO TABS: 20.0000 mg | ORAL_TABLET | Freq: Every day | ORAL | Status: DC

## 2013-02-03 MED ORDER — ATORVASTATIN CALCIUM 40 MG PO TABS: 40.0000 mg | ORAL_TABLET | Freq: Every day | ORAL | Status: DC

## 2013-02-03 NOTE — Patient Instructions (Signed)
F/u in 4 month,with rectal, call if you need me before  Increase the citalopram dose back to 20 mg one daily  Commit to daily exercise , and cut back on sugar and starchy foods so that you lose weight and blood sugar gets back to normal, it is slightly high now.  Cholesterol, liver, kidney and thyroid function, and prostate level are normal;

## 2013-02-03 NOTE — Progress Notes (Signed)
  Subjective:    Patient ID: Terry Love, male    DOB: 04-May-1935, 77 y.o.   MRN: [id number]  HPI  The PT is here for follow up and re-evaluation of chronic medical conditions, medication management and review of any available recent lab and radiology data.  Preventive health is updated, specifically  Cancer screening and Immunization.   Questions or concerns regarding consultations or procedures which the PT has had in the interim are  addressed. The PT denies any adverse reactions to current medications since the last visit.  Increased stress due to illness in family member , but coping the best able. Feels increased anxiety when has nothing to do at home. Exercise is less focussed, and he has gained weight , now concern is over elevated fasting sugar which he will work on      Review of Systems See HPI Denies recent fever or chills. Denies sinus pressure, nasal congestion, ear pain or sore throat. Denies chest congestion, productive cough or wheezing. Denies chest pains, palpitations and leg swelling Denies abdominal pain, nausea, vomiting,diarrhea or constipation.   Denies dysuria, frequency, hesitancy or incontinence. Denies joint pain, swelling and limitation in mobility. Denies headaches, seizures, numbness, or tingling. . Denies skin break down or rash.        Objective:   Physical Exam Patient alert and oriented and in no cardiopulmonary distress.  HEENT: No facial asymmetry, EOMI, no sinus tenderness,  oropharynx pink and moist.  Neck supple no adenopathy.  Chest: Clear to auscultation bilaterally.  CVS: S1, S2 no murmurs, no S3.  ABD: Soft non tender. Bowel sounds normal.  Ext: No edema  MS: Adequate ROM spine, shoulders, hips and knees.  Skin: Intact, no ulcerations or rash noted.  Psych: Good eye contact, normal affect. Memory intact mildly anxious and  depressed appearing.  CNS: CN 2-12 intact, power, tone and sensation normal  throughout.        Assessment & Plan:

## 2013-02-03 NOTE — Assessment & Plan Note (Signed)
Has been using nicorette, states his appetite is increased

## 2013-02-17 ENCOUNTER — Other Ambulatory Visit: Payer: Self-pay | Admitting: Family Medicine

## 2013-03-01 NOTE — Assessment & Plan Note (Signed)
Unchanged. Patient re-educated about  the importance of commitment to a  minimum of 150 minutes of exercise per week. The importance of healthy food choices with portion control discussed. Encouraged to start a food diary, count calories and to consider  joining a support group. Sample diet sheets offered. Goals set by the patient for the next several months.    

## 2013-03-01 NOTE — Assessment & Plan Note (Signed)
asymptomatic bradycardia for over 8 years ,  No change, ongoing monitoring

## 2013-03-01 NOTE — Assessment & Plan Note (Signed)
Plans to get hearing Aids through the [place]

## 2013-03-01 NOTE — Assessment & Plan Note (Signed)
Still not at best level of function, despite re introduction of med , albeit at a lower dose. Not suicidal or homicidal, but notes increased anxiety and more difficulty as far as motivation to get uo and out Inc dose of med, commit to daily exewrcise , attempt this as a grp activity to keep motivation up

## 2013-03-01 NOTE — Assessment & Plan Note (Signed)
Controlled, no change in medication Hyperlipidemia:Low fat diet discussed and encouraged.  \ 

## 2013-05-29 ENCOUNTER — Ambulatory Visit: Payer: Medicare Other | Admitting: Family Medicine

## 2013-06-02 ENCOUNTER — Ambulatory Visit: Payer: Medicare Other | Admitting: Family Medicine

## 2013-06-08 ENCOUNTER — Encounter: Payer: Self-pay | Admitting: Family Medicine

## 2013-06-08 ENCOUNTER — Ambulatory Visit (INDEPENDENT_AMBULATORY_CARE_PROVIDER_SITE_OTHER): Payer: Medicare Other | Admitting: Family Medicine

## 2013-06-08 ENCOUNTER — Encounter (INDEPENDENT_AMBULATORY_CARE_PROVIDER_SITE_OTHER): Payer: Self-pay

## 2013-06-08 VITALS — BP 124/74 | HR 55 | Resp 16 | Ht 69.0 in | Wt 229.0 lb

## 2013-06-08 DIAGNOSIS — F32A Depression, unspecified: Secondary | ICD-10-CM

## 2013-06-08 DIAGNOSIS — E785 Hyperlipidemia, unspecified: Secondary | ICD-10-CM

## 2013-06-08 DIAGNOSIS — R194 Change in bowel habit: Secondary | ICD-10-CM | POA: Insufficient documentation

## 2013-06-08 DIAGNOSIS — Z13820 Encounter for screening for osteoporosis: Secondary | ICD-10-CM | POA: Diagnosis not present

## 2013-06-08 DIAGNOSIS — E669 Obesity, unspecified: Secondary | ICD-10-CM

## 2013-06-08 DIAGNOSIS — F329 Major depressive disorder, single episode, unspecified: Secondary | ICD-10-CM

## 2013-06-08 DIAGNOSIS — F3289 Other specified depressive episodes: Secondary | ICD-10-CM

## 2013-06-08 DIAGNOSIS — M899 Disorder of bone, unspecified: Secondary | ICD-10-CM

## 2013-06-08 DIAGNOSIS — M949 Disorder of cartilage, unspecified: Secondary | ICD-10-CM

## 2013-06-08 DIAGNOSIS — H9190 Unspecified hearing loss, unspecified ear: Secondary | ICD-10-CM

## 2013-06-08 DIAGNOSIS — Z1211 Encounter for screening for malignant neoplasm of colon: Secondary | ICD-10-CM

## 2013-06-08 DIAGNOSIS — R198 Other specified symptoms and signs involving the digestive system and abdomen: Secondary | ICD-10-CM | POA: Diagnosis not present

## 2013-06-08 DIAGNOSIS — R7301 Impaired fasting glucose: Secondary | ICD-10-CM

## 2013-06-08 LAB — HEMOCCULT GUIAC POC 1CARD (OFFICE): Fecal Occult Blood, POC: NEGATIVE

## 2013-06-08 LAB — HEPATIC FUNCTION PANEL
ALK PHOS: 45 U/L (ref 39–117)
ALT: 21 U/L (ref 0–53)
AST: 27 U/L (ref 0–37)
Albumin: 4.4 g/dL (ref 3.5–5.2)
BILIRUBIN DIRECT: 0.1 mg/dL (ref 0.0–0.3)
BILIRUBIN TOTAL: 0.7 mg/dL (ref 0.2–1.2)
Indirect Bilirubin: 0.6 mg/dL (ref 0.2–1.2)
Total Protein: 7.1 g/dL (ref 6.0–8.3)

## 2013-06-08 LAB — LIPID PANEL
Cholesterol: 141 mg/dL (ref 0–200)
HDL: 43 mg/dL (ref >39)
LDL Cholesterol: 84 mg/dL (ref 0–99)
Total CHOL/HDL Ratio: 3.3 Ratio
Triglycerides: 70 mg/dL (ref <150)
VLDL: 14 mg/dL (ref 0–40)

## 2013-06-08 NOTE — Patient Instructions (Signed)
Annual wellness in  4.5 months, call if you need me before  It is important that you exercise regularly at least 30 minutes 5 times a week. If you develop chest pain, have severe difficulty breathing, or feel very tired, stop exercising immediately and seek medical attention    A healthy diet is rich in fruit, vegetables and whole grains. Poultry fish, nuts and beans are a healthy choice for protein rather then red meat. A low sodium diet and drinking 64 ounces of water daily is generally recommended. Oils and sweet should be limited. Carbohydrates especially for those who are diabetic or overweight, should be limited to 45 to 60 gram per meal. It is important to eat on a regular schedule, at least 3 times daily. Snacks should be primarily fruits, vegetables or nuts.  Adequate rest, generally 6 to 8 hours per night is important for good health.Good sleep hygiene involves setting a regular bedtime, and turning off all sound and light in your sleep environment.Limiting caffeine intake will also help with the ability to rest well  Lab work needs to be done m today, hBA1C, fasting lipid and hepatic  Panel  You are referred to Dr Sydell Axon and also for a bone density test  Happy that you have hearing aids now  Congrats on weight loss, no med changes  All medications need to be brought to every visit

## 2013-06-09 ENCOUNTER — Ambulatory Visit: Payer: Medicare Other | Admitting: Gastroenterology

## 2013-06-09 LAB — HEMOGLOBIN A1C
Hgb A1c MFr Bld: 5.5 % (ref <5.7)
Mean Plasma Glucose: 111 mg/dL (ref <117)

## 2013-06-11 ENCOUNTER — Encounter (HOSPITAL_COMMUNITY): Payer: Self-pay | Admitting: Pharmacy Technician

## 2013-06-11 ENCOUNTER — Other Ambulatory Visit: Payer: Self-pay | Admitting: Internal Medicine

## 2013-06-11 ENCOUNTER — Ambulatory Visit (INDEPENDENT_AMBULATORY_CARE_PROVIDER_SITE_OTHER): Payer: Medicare Other | Admitting: Gastroenterology

## 2013-06-11 ENCOUNTER — Encounter: Payer: Self-pay | Admitting: Gastroenterology

## 2013-06-11 VITALS — BP 119/68 | HR 59 | Temp 97.6°F | Wt 230.6 lb

## 2013-06-11 DIAGNOSIS — R198 Other specified symptoms and signs involving the digestive system and abdomen: Secondary | ICD-10-CM | POA: Diagnosis not present

## 2013-06-11 DIAGNOSIS — Z800 Family history of malignant neoplasm of digestive organs: Secondary | ICD-10-CM

## 2013-06-11 DIAGNOSIS — R194 Change in bowel habit: Secondary | ICD-10-CM

## 2013-06-11 MED ORDER — PEG 3350-KCL-NA BICARB-NACL 420 G PO SOLR: 4000.0000 mL | ORAL | Status: DC

## 2013-06-11 NOTE — Patient Instructions (Addendum)
We have scheduled you for a colonoscopy with Dr. Gala Romney in the near future.  Further recommendations to follow!  High-Fiber Diet Fiber is found in fruits, vegetables, and grains. A high-fiber diet encourages the addition of more whole grains, legumes, fruits, and vegetables in your diet. The recommended amount of fiber for adult males is 38 g per day. For adult females, it is 25 g per day. Pregnant and lactating women should get 28 g of fiber per day. If you have a digestive or bowel problem, ask your caregiver for advice before adding high-fiber foods to your diet. Eat a variety of high-fiber foods instead of only a select few type of foods.  PURPOSE  To increase stool bulk.  To make bowel movements more regular to prevent constipation.  To lower cholesterol.  To prevent overeating. WHEN IS THIS DIET USED?  It may be used if you have constipation and hemorrhoids.  It may be used if you have uncomplicated diverticulosis (intestine condition) and irritable bowel syndrome.  It may be used if you need help with weight management.  It may be used if you want to add it to your diet as a protective measure against atherosclerosis, diabetes, and cancer. SOURCES OF FIBER  Whole-grain breads and cereals.  Fruits, such as apples, oranges, bananas, berries, prunes, and pears.  Vegetables, such as green peas, carrots, sweet potatoes, beets, broccoli, cabbage, spinach, and artichokes.  Legumes, such split peas, soy, lentils.  Almonds. FIBER CONTENT IN FOODS Starches and Grains / Dietary Fiber (g)  Cheerios, 1 cup / 3 g  Corn Flakes cereal, 1 cup / 0.7 g  Rice crispy treat cereal, 1 cup / 0.3 g  Instant oatmeal (cooked),  cup / 2 g  Frosted wheat cereal, 1 cup / 5.1 g  Brown, long-grain rice (cooked), 1 cup / 3.5 g  White, long-grain rice (cooked), 1 cup / 0.6 g  Enriched macaroni (cooked), 1 cup / 2.5 g Legumes / Dietary Fiber (g)  Baked beans (canned, plain, or  vegetarian),  cup / 5.2 g  Kidney beans (canned),  cup / 6.8 g  Pinto beans (cooked),  cup / 5.5 g Breads and Crackers / Dietary Fiber (g)  Plain or honey graham crackers, 2 squares / 0.7 g  Saltine crackers, 3 squares / 0.3 g  Plain, salted pretzels, 10 pieces / 1.8 g  Whole-wheat bread, 1 slice / 1.9 g  White bread, 1 slice / 0.7 g  Raisin bread, 1 slice / 1.2 g  Plain bagel, 3 oz / 2 g  Flour tortilla, 1 oz / 0.9 g  Corn tortilla, 1 small / 1.5 g  Hamburger or hotdog bun, 1 small / 0.9 g Fruits / Dietary Fiber (g)  Apple with skin, 1 medium / 4.4 g  Sweetened applesauce,  cup / 1.5 g  Banana,  medium / 1.5 g  Grapes, 10 grapes / 0.4 g  Orange, 1 small / 2.3 g  Raisin, 1.5 oz / 1.6 g  Melon, 1 cup / 1.4 g Vegetables / Dietary Fiber (g)  Green beans (canned),  cup / 1.3 g  Carrots (cooked),  cup / 2.3 g  Broccoli (cooked),  cup / 2.8 g  Peas (cooked),  cup / 4.4 g  Mashed potatoes,  cup / 1.6 g  Lettuce, 1 cup / 0.5 g  Corn (canned),  cup / 1.6 g  Tomato,  cup / 1.1 g Document Released: 03/26/2005 Document Revised: 09/25/2011 Document Reviewed: 06/28/2011 [employer] Patient  Information 2014 [employer].  

## 2013-06-11 NOTE — Assessment & Plan Note (Signed)
Mother diagnosed in her early 50s and deceased shortly  thereafter due to metastatic disease.

## 2013-06-11 NOTE — Progress Notes (Signed)
cc'd to pcp 

## 2013-06-11 NOTE — Assessment & Plan Note (Signed)
78 year old male with new onset constipation over last several months and small, pebble-like stool with associated bloating, no rectal bleeding. BMs are now starting to return to normal; heme negative X 1 through PCP. Likely related to dietary habits, stress, but unable to rule out occult issues. Although mother diagnosed with colon cancer at an older age 639s), he is concerned and desires early interval colonoscopy now. Last TCS in 2012 by Dr. Laural Golden. I feel this is reasonable.   Proceed with TCS with Dr. Gala Romney in near future: the risks, benefits, and alternatives have been discussed with the patient in detail. The patient states understanding and desires to proceed. High fiber diet

## 2013-06-11 NOTE — Progress Notes (Signed)
Primary Care Physician:  Tula Nakayama, MD Primary Gastroenterologist:  Dr. Gala Romney   Chief Complaint  Patient presents with  . Colonoscopy    HPI:   Terry Love presents today at the request of Dr. Moshe Cipro secondary to change in bowel habits. Last colonoscopy in Oct 2012 by Dr. Laural Golden with benign polyp. He has a remote history of colon polyps as well and a positive family history of colon cancer in his mother in her later years. He is specifically requesting to see Dr. Gala Romney.   States for 3 months has noticed a change in bowel habits with new onset constipation. Associated bloating, required to take laxatives, which is unusual for him. Bowel movements are "knotty", small pieces. No abdominal pain. No unexplained weight loss. Good appetite. No rectal bleeding. No changes in medication or dietary intake. No N/V. No dysphagia. BMs with some improvement for last few days now. Concerned about FH of colon cancer; would like to have colonoscopy for further evaluation.   Heme negative on file through PCP X 1.   Past Medical History  Diagnosis Date  . Bradycardia     Chronic   . Obesity   . Hyperlipidemia   . Facial tic   . Seasonal allergies   . Anxiety   . Colon polyps     Past Surgical History  Procedure Laterality Date  . Carpal tunnel  release right  2006  . Colonoscopy    . Colonoscopy  01/25/2011    Dr. Rehman:Examination performed to cecum Pan colonic diverticulosis/2 small polyps ablated via cold biopsy from transverse colon/External hemorrhoids, benign polyps    Current Outpatient Prescriptions  Medication Sig Dispense Refill  . aspirin (BAYER ASPIRIN EC LOW DOSE) 81 MG EC tablet Take 81 mg by mouth daily.        Marland Kitchen atorvastatin (LIPITOR) 40 MG tablet Take 1 tablet (40 mg total) by mouth at bedtime.  90 tablet  3  . calcium-vitamin D (OSCAL WITH D) 500-200 MG-UNIT per tablet Take 1 tablet by mouth 3 (three) times daily.       . citalopram (CELEXA) 20 MG tablet  TAKE 1 TABLET (20 MG TOTAL) BY MOUTH DAILY.  90 tablet  0  . Multiple Vitamin (MULTIVITAMIN) tablet Take 1 tablet by mouth daily. Unspecified frequency      . naproxen sodium (ANAPROX) 220 MG tablet Take 220 mg by mouth as needed.       Current Facility-Administered Medications  Medication Dose Route Frequency Provider Last Rate Last Dose  . Influenza (>/= 3 years) inactive virus vaccine (FLVIRIN/FLUZONE) injection SUSP 0.5 mL  0.5 mL Intramuscular Once Fayrene Helper, MD        Allergies as of 06/11/2013  . (No Known Allergies)    Family History  Problem Relation Age of Onset  . Aneurysm Father   . Colon cancer Mother     diagnosed at age 47  . Stroke Brother     History   Social History  . Marital Status: Married    Spouse Name: N/A    Number of Children: N/A  . Years of Education: N/A   Occupational History  . Not on file.   Social History Main Topics  . Smoking status: Never Smoker   . Smokeless tobacco: Former Systems developer    Types: Chew    Quit date: 09/24/2012  . Alcohol Use: Yes     Comment: occasional  . Drug Use: No  . Sexual Activity:  Not on file   Other Topics Concern  . Not on file   Social History Narrative  . No narrative on file    Review of Systems: Gen: Denies any fever, chills, fatigue, weight loss, lack of appetite.  CV: Denies chest pain, heart palpitations, peripheral edema, syncope.  Resp: +DOE GI: see HPI GU : Denies urinary burning, urinary frequency, urinary hesitancy MS: occasional leg cramps Derm: Denies rash, itching, dry skin Psych: Denies depression, anxiety, memory loss, and confusion Heme: Denies bruising, bleeding, and enlarged lymph nodes.  Physical Exam: BP 119/68  Pulse 59  Temp(Src) 97.6 F (36.4 C) (Oral)  Wt 230 lb 9.6 oz (104.599 kg) General:   Alert and oriented. Pleasant and cooperative. Well-nourished and well-developed.  Head:  Normocephalic and atraumatic. Eyes:  Without icterus, sclera clear and conjunctiva  pink.  Ears:  Mild HOH Nose:  No deformity, discharge,  or lesions. Mouth:  No deformity or lesions, oral mucosa pink.  Neck:  Supple, without mass or thyromegaly. Lungs:  Clear to auscultation bilaterally. No wheezes, rales, or rhonchi. No distress.  Heart:  S1, S2 present  Abdomen:  +BS, soft, obese, non-tender and non-distended. No HSM noted. No guarding or rebound. No masses appreciated.  Rectal:  Deferred  Msk:  Symmetrical without gross deformities. Normal posture. Extremities:  Without clubbing or edema. Neurologic:  Alert and  oriented x4;  grossly normal neurologically. Skin:  Intact without significant lesions or rashes. Cervical Nodes:  No significant cervical adenopathy. Psych:  Alert and cooperative. Normal mood and affect.

## 2013-06-16 ENCOUNTER — Other Ambulatory Visit (HOSPITAL_COMMUNITY): Payer: Medicare Other

## 2013-06-29 ENCOUNTER — Ambulatory Visit (HOSPITAL_COMMUNITY)
Admission: RE | Admit: 2013-06-29 | Discharge: 2013-06-29 | Disposition: A | Discharge status: Home / Self Care | Payer: Medicare Other | Source: Ambulatory Visit | Attending: Internal Medicine | Admitting: Internal Medicine

## 2013-06-29 ENCOUNTER — Encounter (HOSPITAL_COMMUNITY)
Admission: RE | Disposition: A | Discharge status: Home / Self Care | Payer: Self-pay | Source: Ambulatory Visit | Attending: Internal Medicine

## 2013-06-29 ENCOUNTER — Encounter (HOSPITAL_COMMUNITY): Payer: Self-pay | Admitting: *Deleted

## 2013-06-29 DIAGNOSIS — E669 Obesity, unspecified: Secondary | ICD-10-CM | POA: Diagnosis not present

## 2013-06-29 DIAGNOSIS — D129 Benign neoplasm of anus and anal canal: Principal | ICD-10-CM

## 2013-06-29 DIAGNOSIS — Z7982 Long term (current) use of aspirin: Secondary | ICD-10-CM | POA: Insufficient documentation

## 2013-06-29 DIAGNOSIS — Z860100 Personal history of colon polyps, unspecified: Secondary | ICD-10-CM | POA: Insufficient documentation

## 2013-06-29 DIAGNOSIS — D126 Benign neoplasm of colon, unspecified: Secondary | ICD-10-CM | POA: Insufficient documentation

## 2013-06-29 DIAGNOSIS — K620 Anal polyp: Secondary | ICD-10-CM | POA: Diagnosis not present

## 2013-06-29 DIAGNOSIS — K5730 Diverticulosis of large intestine without perforation or abscess without bleeding: Secondary | ICD-10-CM | POA: Diagnosis not present

## 2013-06-29 DIAGNOSIS — Z1211 Encounter for screening for malignant neoplasm of colon: Secondary | ICD-10-CM | POA: Diagnosis not present

## 2013-06-29 DIAGNOSIS — F411 Generalized anxiety disorder: Secondary | ICD-10-CM | POA: Insufficient documentation

## 2013-06-29 DIAGNOSIS — Z79899 Other long term (current) drug therapy: Secondary | ICD-10-CM | POA: Insufficient documentation

## 2013-06-29 DIAGNOSIS — D128 Benign neoplasm of rectum: Secondary | ICD-10-CM | POA: Diagnosis not present

## 2013-06-29 DIAGNOSIS — K621 Rectal polyp: Secondary | ICD-10-CM

## 2013-06-29 DIAGNOSIS — Z800 Family history of malignant neoplasm of digestive organs: Secondary | ICD-10-CM | POA: Insufficient documentation

## 2013-06-29 DIAGNOSIS — E785 Hyperlipidemia, unspecified: Secondary | ICD-10-CM | POA: Insufficient documentation

## 2013-06-29 HISTORY — PX: COLONOSCOPY: SHX5424

## 2013-06-29 SURGERY — COLONOSCOPY
Anesthesia: Moderate Sedation

## 2013-06-29 MED ORDER — STERILE WATER FOR IRRIGATION IR SOLN
Status: DC | PRN
  Administered 2013-06-29: 11:00:00

## 2013-06-29 MED ORDER — ONDANSETRON HCL 4 MG/2ML IJ SOLN
INTRAMUSCULAR | Status: AC
  Filled 2013-06-29: qty 2

## 2013-06-29 MED ORDER — MEPERIDINE HCL 100 MG/ML IJ SOLN
INTRAMUSCULAR | Status: DC | PRN
  Administered 2013-06-29: 50 mg via INTRAVENOUS

## 2013-06-29 MED ORDER — MIDAZOLAM HCL 5 MG/5ML IJ SOLN
INTRAMUSCULAR | Status: AC
  Filled 2013-06-29: qty 10

## 2013-06-29 MED ORDER — MEPERIDINE HCL 100 MG/ML IJ SOLN
INTRAMUSCULAR | Status: AC
  Filled 2013-06-29: qty 2

## 2013-06-29 MED ORDER — SODIUM CHLORIDE 0.9 % IV SOLN
INTRAVENOUS | Status: DC
  Administered 2013-06-29: 10:00:00 via INTRAVENOUS

## 2013-06-29 MED ORDER — MIDAZOLAM HCL 5 MG/5ML IJ SOLN
INTRAMUSCULAR | Status: DC | PRN
  Administered 2013-06-29: 2 mg via INTRAVENOUS

## 2013-06-29 MED ORDER — ONDANSETRON HCL 4 MG/2ML IJ SOLN
INTRAMUSCULAR | Status: DC | PRN
  Administered 2013-06-29: 4 mg via INTRAVENOUS

## 2013-06-29 NOTE — Discharge Instructions (Addendum)
Colonoscopy Discharge Instructions  Read the instructions outlined below and refer to this sheet in the next few weeks. These discharge instructions provide you with general information on caring for yourself after you leave the hospital. Your doctor may also give you specific instructions. While your treatment has been planned according to the most current medical practices available, unavoidable complications occasionally occur. If you have any problems or questions after discharge, call Dr. Gala Romney at [phone]. ACTIVITY  You may resume your regular activity, but move at a slower pace for the next 24 hours.   Take frequent rest periods for the next 24 hours.   Walking will help get rid of the air and reduce the bloated feeling in your belly (abdomen).   No driving for 24 hours (because of the medicine (anesthesia) used during the test).    Do not sign any important legal documents or operate any machinery for 24 hours (because of the anesthesia used during the test).  NUTRITION  Drink plenty of fluids.   You may resume your normal diet as instructed by your doctor.   Begin with a light meal and progress to your normal diet. Heavy or fried foods are harder to digest and may make you feel sick to your stomach (nauseated).   Avoid alcoholic beverages for 24 hours or as instructed.  MEDICATIONS  You may resume your normal medications unless your doctor tells you otherwise.  WHAT YOU CAN EXPECT TODAY  Some feelings of bloating in the abdomen.   Passage of more gas than usual.   Spotting of blood in your stool or on the toilet paper.  IF YOU HAD POLYPS REMOVED DURING THE COLONOSCOPY:  No aspirin products for 7 days or as instructed.   No alcohol for 7 days or as instructed.   Eat a soft diet for the next 24 hours.  FINDING OUT THE RESULTS OF YOUR TEST Not all test results are available during your visit. If your test results are not back during the visit, make an appointment  with your caregiver to find out the results. Do not assume everything is normal if you have not heard from your caregiver or the medical facility. It is important for you to follow up on all of your test results.  SEEK IMMEDIATE MEDICAL ATTENTION IF:  You have more than a spotting of blood in your stool.   Your belly is swollen (abdominal distention).   You are nauseated or vomiting.   You have a temperature over 101.   You have abdominal pain or discomfort that is severe or gets worse throughout the day.    Polyp and diverticulosis information provided  Begin Benefiber 2 teaspoons twice daily-take everyday  Further recommendations to follow pending review of pathology report       Colon Polyps Polyps are lumps of extra tissue growing inside the body. Polyps can grow in the large intestine (colon). Most colon polyps are noncancerous (benign). However, some colon polyps can become cancerous over time. Polyps that are larger than a pea may be harmful. To be safe, caregivers remove and test all polyps. CAUSES  Polyps form when mutations in the genes cause your cells to grow and divide even though no more tissue is needed. RISK FACTORS There are a number of risk factors that can increase your chances of getting colon polyps. They include:  Being older than 50 years.  Family history of colon polyps or colon cancer.  Long-term colon diseases, such as colitis or Crohn  disease.  Being overweight.  Smoking.  Being inactive.  Drinking too much alcohol. SYMPTOMS  Most small polyps do not cause symptoms. If symptoms are present, they may include:  Blood in the stool. The stool may look dark red or black.  Constipation or diarrhea that lasts longer than 1 week. DIAGNOSIS People often do not know they have polyps until their caregiver finds them during a regular checkup. Your caregiver can use 4 tests to check for polyps:  Digital rectal exam. The caregiver wears gloves and  feels inside the rectum. This test would find polyps only in the rectum.  Barium enema. The caregiver puts a liquid called barium into your rectum before taking X-rays of your colon. Barium makes your colon look white. Polyps are dark, so they are easy to see in the X-ray pictures.  Sigmoidoscopy. A thin, flexible tube (sigmoidoscope) is placed into your rectum. The sigmoidoscope has a light and tiny camera in it. The caregiver uses the sigmoidoscope to look at the last third of your colon.  Colonoscopy. This test is like sigmoidoscopy, but the caregiver looks at the entire colon. This is the most common method for finding and removing polyps. TREATMENT  Any polyps will be removed during a sigmoidoscopy or colonoscopy. The polyps are then tested for cancer. PREVENTION  To help lower your risk of getting more colon polyps:  Eat plenty of fruits and vegetables. Avoid eating fatty foods.  Do not smoke.  Avoid drinking alcohol.  Exercise every day.  Lose weight if recommended by your caregiver.  Eat plenty of calcium and folate. Foods that are rich in calcium include milk, cheese, and broccoli. Foods that are rich in folate include chickpeas, kidney beans, and spinach. HOME CARE INSTRUCTIONS Keep all follow-up appointments as directed by your caregiver. You may need periodic exams to check for polyps. SEEK MEDICAL CARE IF: You notice bleeding during a bowel movement. Document Released: 12/21/2003 Document Revised: 06/18/2011 Document Reviewed: 06/05/2011 [facility] Patient Information 2014 [place].         Diverticulosis Diverticulosis is a common condition that develops when small pouches (diverticula) form in the wall of the colon. The risk of diverticulosis increases with age. It happens more often in people who eat a low-fiber diet. Most individuals with diverticulosis have no symptoms. Those individuals with symptoms usually experience abdominal pain, constipation, or  loose stools (diarrhea). HOME CARE INSTRUCTIONS   Increase the amount of fiber in your diet as directed by your caregiver or dietician. This may reduce symptoms of diverticulosis.  Your caregiver may recommend taking a dietary fiber supplement.  Drink at least 6 to 8 glasses of water each day to prevent constipation.  Try not to strain when you have a bowel movement.  Your caregiver may recommend avoiding nuts and seeds to prevent complications, although this is still an uncertain benefit.  Only take over-the-counter or prescription medicines for pain, discomfort, or fever as directed by your caregiver. FOODS WITH HIGH FIBER CONTENT INCLUDE:  Fruits. Apple, peach, pear, tangerine, raisins, prunes.  Vegetables. Brussels sprouts, asparagus, broccoli, cabbage, carrot, cauliflower, romaine lettuce, spinach, summer squash, tomato, winter squash, zucchini.  Starchy Vegetables. Baked beans, kidney beans, lima beans, split peas, lentils, potatoes (with skin).  Grains. Whole wheat bread, brown rice, bran flake cereal, plain oatmeal, white rice, shredded wheat, bran muffins. SEEK IMMEDIATE MEDICAL CARE IF:   You develop increasing pain or severe bloating.  You have an oral temperature above 102 F (38.9 C), not controlled by  medicine.  You develop vomiting or bowel movements that are bloody or black. Document Released: 12/22/2003 Document Revised: 06/18/2011 Document Reviewed: 08/24/2009 [facility] Patient Information 2014 [place].

## 2013-06-29 NOTE — H&P (View-Only) (Signed)
Primary Care Physician:  Tula Nakayama, MD Primary Gastroenterologist:  Dr. Gala Romney   Chief Complaint  Patient presents with  . Colonoscopy    HPI:   Terry Love presents today at the request of Dr. Moshe Cipro secondary to change in bowel habits. Last colonoscopy in Oct 2012 by Dr. Laural Golden with benign polyp. He has a remote history of colon polyps as well and a positive family history of colon cancer in his mother in her later years. He is specifically requesting to see Dr. Gala Romney.   States for 3 months has noticed a change in bowel habits with new onset constipation. Associated bloating, required to take laxatives, which is unusual for him. Bowel movements are "knotty", small pieces. No abdominal pain. No unexplained weight loss. Good appetite. No rectal bleeding. No changes in medication or dietary intake. No N/V. No dysphagia. BMs with some improvement for last few days now. Concerned about FH of colon cancer; would like to have colonoscopy for further evaluation.   Heme negative on file through PCP X 1.   Past Medical History  Diagnosis Date  . Bradycardia     Chronic   . Obesity   . Hyperlipidemia   . Facial tic   . Seasonal allergies   . Anxiety   . Colon polyps     Past Surgical History  Procedure Laterality Date  . Carpal tunnel  release right  2006  . Colonoscopy    . Colonoscopy  01/25/2011    Dr. Rehman:Examination performed to cecum Pan colonic diverticulosis/2 small polyps ablated via cold biopsy from transverse colon/External hemorrhoids, benign polyps    Current Outpatient Prescriptions  Medication Sig Dispense Refill  . aspirin (BAYER ASPIRIN EC LOW DOSE) 81 MG EC tablet Take 81 mg by mouth daily.        Marland Kitchen atorvastatin (LIPITOR) 40 MG tablet Take 1 tablet (40 mg total) by mouth at bedtime.  90 tablet  3  . calcium-vitamin D (OSCAL WITH D) 500-200 MG-UNIT per tablet Take 1 tablet by mouth 3 (three) times daily.       . citalopram (CELEXA) 20 MG tablet  TAKE 1 TABLET (20 MG TOTAL) BY MOUTH DAILY.  90 tablet  0  . Multiple Vitamin (MULTIVITAMIN) tablet Take 1 tablet by mouth daily. Unspecified frequency      . naproxen sodium (ANAPROX) 220 MG tablet Take 220 mg by mouth as needed.       Current Facility-Administered Medications  Medication Dose Route Frequency Provider Last Rate Last Dose  . Influenza (>/= 3 years) inactive virus vaccine (FLVIRIN/FLUZONE) injection SUSP 0.5 mL  0.5 mL Intramuscular Once Fayrene Helper, MD        Allergies as of 06/11/2013  . (No Known Allergies)    Family History  Problem Relation Age of Onset  . Aneurysm Father   . Colon cancer Mother     diagnosed at age 14  . Stroke Brother     History   Social History  . Marital Status: Married    Spouse Name: N/A    Number of Children: N/A  . Years of Education: N/A   Occupational History  . Not on file.   Social History Main Topics  . Smoking status: Never Smoker   . Smokeless tobacco: Former Systems developer    Types: Chew    Quit date: 09/24/2012  . Alcohol Use: Yes     Comment: occasional  . Drug Use: No  . Sexual Activity:  Not on file   Other Topics Concern  . Not on file   Social History Narrative  . No narrative on file    Review of Systems: Gen: Denies any fever, chills, fatigue, weight loss, lack of appetite.  CV: Denies chest pain, heart palpitations, peripheral edema, syncope.  Resp: +DOE GI: see HPI GU : Denies urinary burning, urinary frequency, urinary hesitancy MS: occasional leg cramps Derm: Denies rash, itching, dry skin Psych: Denies depression, anxiety, memory loss, and confusion Heme: Denies bruising, bleeding, and enlarged lymph nodes.  Physical Exam: BP 119/68  Pulse 59  Temp(Src) 97.6 F (36.4 C) (Oral)  Wt 230 lb 9.6 oz (104.599 kg) General:   Alert and oriented. Pleasant and cooperative. Well-nourished and well-developed.  Head:  Normocephalic and atraumatic. Eyes:  Without icterus, sclera clear and conjunctiva  pink.  Ears:  Mild HOH Nose:  No deformity, discharge,  or lesions. Mouth:  No deformity or lesions, oral mucosa pink.  Neck:  Supple, without mass or thyromegaly. Lungs:  Clear to auscultation bilaterally. No wheezes, rales, or rhonchi. No distress.  Heart:  S1, S2 present  Abdomen:  +BS, soft, obese, non-tender and non-distended. No HSM noted. No guarding or rebound. No masses appreciated.  Rectal:  Deferred  Msk:  Symmetrical without gross deformities. Normal posture. Extremities:  Without clubbing or edema. Neurologic:  Alert and  oriented x4;  grossly normal neurologically. Skin:  Intact without significant lesions or rashes. Cervical Nodes:  No significant cervical adenopathy. Psych:  Alert and cooperative. Normal mood and affect.

## 2013-06-29 NOTE — Interval H&P Note (Signed)
History and Physical Interval Note:  06/29/2013 11:04 AM  Terry Love  has presented today for surgery, with the diagnosis of Terry Love  The various methods of treatment have been discussed with the patient and family. After consideration of risks, benefits and other options for treatment, the patient has consented to  Procedure(s) with comments: COLONOSCOPY (N/A) - 10:45 as a surgical intervention .  The patient's history has been reviewed, patient examined, no change in status, stable for surgery.  I have reviewed the patient's chart and labs.  Questions were answered to the patient's satisfaction.     No change. Colonoscopy per plan.The risks, benefits, limitations, alternatives and imponderables have been reviewed with the patient. Questions have been answered. All parties are agreeable.   Terry Love

## 2013-06-29 NOTE — Op Note (Signed)
[facility] [address] [place], [address]   COLONOSCOPY PROCEDURE REPORT  PATIENT: Terry, Love  MR#:         [id number] BIRTHDATE: 03-25-1936 , 78  yrs. old GENDER: Male ENDOSCOPIST: R.  Garfield Cornea, MD FACP FACG REFERRED BY:  Tula Nakayama, M.D. PROCEDURE DATE:  06/29/2013 PROCEDURE:     Colonoscopy with snare polypectomy and biopsy  INDICATIONS: Change in bowel habits; positive family history colon cancer at advanced age and distant personal history of colon adenoma  INFORMED CONSENT:  The risks, benefits, alternatives and imponderables including but not limited to bleeding, perforation as well as the possibility of a missed lesion have been reviewed.  The potential for biopsy, lesion removal, etc. have also been discussed.  Questions have been answered.  All parties agreeable. Please see the history and physical in the medical record for more information.  MEDICATIONS: Versed 2 mg IVand Demerol 50 mg IV. Zofran 4 mg IV  DESCRIPTION OF PROCEDURE:  After a digital rectal exam was performed, the EC-3890Li ([id number])  colonoscope was advanced from the anus through the rectum and colon to the area of the cecum, ileocecal valve and appendiceal orifice.  The cecum was deeply intubated.  These structures were well-seen and photographed for the record.  From the level of the cecum and ileocecal valve, the scope was slowly and cautiously withdrawn.  The mucosal surfaces were carefully surveyed utilizing scope tip deflection to facilitate fold flattening as needed.  The scope was pulled down into the rectum where a thorough examination including retroflexion was performed.    FINDINGS:  Adequate preparation. (1) diminutive polyp in the rectum at 5 cm from the anal verge; otherwise, the remainder of the rectal mucosa appeared normal. Scattered pancolonic diverticula. (1) 3 mm polyp in the mid sigmoid segment and (1)  5 mm polyp in the transverse  segment; otherwise, the remainder of the colonic mucosa appeared normal.  THERAPEUTIC / DIAGNOSTIC MANEUVERS PERFORMED:  The rectal polyp was removed with cold biopsy technique; the sigmoid polyp was removed with cold biopsy technique. The transverse colon polyp was removed with cold snare technique.  COMPLICATIONS: None  CECAL WITHDRAWAL TIME:  15 minutes  IMPRESSION:  Rectal and colonic polyps-removed as described above. Colonic diverticulosis  RECOMMENDATIONS: Followup on pathology. Begin Benefiber 2 teaspoons orally twice daily.   _______________________________ eSigned:  R. Garfield Cornea, MD FACP [facility] 06/29/2013 11:36 AM   CC:

## 2013-07-01 ENCOUNTER — Encounter: Payer: Self-pay | Admitting: Internal Medicine

## 2013-07-02 ENCOUNTER — Encounter (HOSPITAL_COMMUNITY): Payer: Self-pay | Admitting: Internal Medicine

## 2013-07-06 ENCOUNTER — Ambulatory Visit (HOSPITAL_COMMUNITY)
Admission: RE | Admit: 2013-07-06 | Discharge: 2013-07-06 | Disposition: A | Discharge status: Home / Self Care | Payer: Medicare Other | Source: Ambulatory Visit | Attending: Family Medicine | Admitting: Family Medicine

## 2013-07-06 ENCOUNTER — Encounter: Payer: Self-pay | Admitting: Family Medicine

## 2013-07-06 DIAGNOSIS — M899 Disorder of bone, unspecified: Secondary | ICD-10-CM | POA: Insufficient documentation

## 2013-07-06 DIAGNOSIS — Z13820 Encounter for screening for osteoporosis: Secondary | ICD-10-CM

## 2013-07-06 DIAGNOSIS — M949 Disorder of cartilage, unspecified: Secondary | ICD-10-CM | POA: Diagnosis not present

## 2013-07-06 DIAGNOSIS — M8580 Other specified disorders of bone density and structure, unspecified site: Secondary | ICD-10-CM | POA: Insufficient documentation

## 2013-09-13 NOTE — Assessment & Plan Note (Signed)
Recent change in stool habits , needs GI eval will refer to Doc of his choice

## 2013-09-13 NOTE — Assessment & Plan Note (Signed)
Improved. Pt applauded on succesful weight loss through lifestyle change, and encouraged to continue same. Weight loss goal set for the next several months.  

## 2013-09-13 NOTE — Assessment & Plan Note (Signed)
Improved with recent acquisition of hearing aids

## 2013-09-13 NOTE — Assessment & Plan Note (Signed)
Controlled, no change in medication Hyperlipidemia:Low fat diet discussed and encouraged.  \ 

## 2013-09-13 NOTE — Progress Notes (Signed)
   Subjective:    Patient ID: Terry Love, male    DOB: Mar 05, 1936, 78 y.o.   MRN: [id number]  HPI The PT is here for follow up and re-evaluation of chronic medical conditions, medication management and review of any available recent lab and radiology data.  Preventive health is updated, specifically  Cancer screening and Immunization.   Questions or concerns regarding consultations or procedures which the PT has had in the interim are  addressed. The PT denies any adverse reactions to current medications since the last visit.  C/o recent change in bowel character and anxious to ensure no underlying gi pathology, requess also a change in Provider      Review of Systems See HPI Denies recent fever or chills. Denies sinus pressure, nasal congestion, ear pain or sore throat. Denies chest congestion, productive cough or wheezing. Denies chest pains, palpitations and leg swelling Denies abdominal pain, nausea, vomiting,diarrhea or constipation.   Denies dysuria, frequency, hesitancy or incontinence. Denies joint pain, swelling and limitation in mobility. Denies headaches, seizures, numbness, or tingling. Denies depression, anxiety or insomnia. Denies skin break down or rash.        Objective:   Physical Exam  BP 124/74  Pulse 55  Resp 16  Ht 5\' 9"  (1.753 m)  Wt 229 lb (103.874 kg)  BMI 33.80 kg/m2  SpO2 97% Patient alert and oriented and in no cardiopulmonary distress.  HEENT: No facial asymmetry, EOMI,   oropharynx pink and moist.  Neck supple no JVD, no mass.  Chest: Clear to auscultation bilaterally.  CVS: S1, S2 no murmurs, no S3.  ABD: Soft non tender.   Ext: No edema  MS: Adequate ROM spine, shoulders, hips and knees.  Skin: Intact, no ulcerations or rash noted.  Psych: Good eye contact, normal affect. Memory intact not anxious or depressed appearing.  CNS: CN 2-12 intact, power,  normal throughout.no focal deficits noted.       Assessment & Plan:    HYPERLIPIDEMIA Controlled, no change in medication Hyperlipidemia:Low fat diet discussed and encouraged.    UNSPECIFIED HEARING LOSS Improved with recent acquisition of hearing aids  OBESITY Improved. Pt applauded on succesful weight loss through lifestyle change, and encouraged to continue same. Weight loss goal set for the next several months.   Depression Controlled, no change in medication   Change in stool habits Recent change in stool habits , needs GI eval will refer to Doc of his choice  OSTEOPENIA Continue calcium with D and weight bearing activity

## 2013-09-13 NOTE — Assessment & Plan Note (Signed)
Continue calcium with D and weight bearing activity

## 2013-09-13 NOTE — Assessment & Plan Note (Signed)
Controlled, no change in medication  

## 2013-10-07 ENCOUNTER — Telehealth: Payer: Self-pay

## 2013-10-07 DIAGNOSIS — R5381 Other malaise: Secondary | ICD-10-CM

## 2013-10-07 DIAGNOSIS — R7301 Impaired fasting glucose: Secondary | ICD-10-CM

## 2013-10-07 DIAGNOSIS — E785 Hyperlipidemia, unspecified: Secondary | ICD-10-CM

## 2013-10-07 DIAGNOSIS — E559 Vitamin D deficiency, unspecified: Secondary | ICD-10-CM

## 2013-10-07 DIAGNOSIS — R5383 Other fatigue: Secondary | ICD-10-CM

## 2013-10-07 NOTE — Telephone Encounter (Signed)
pls order cbc, fasting lipid, hepatic , blood sugar(glucose) and vit D (was low in 2010)

## 2013-10-07 NOTE — Addendum Note (Signed)
Addended by: Eual Fines on: 10/07/2013 10:09 AM   Modules accepted: Orders

## 2013-10-07 NOTE — Telephone Encounter (Signed)
Patient aware and lab order faxed to the lab for fasting bloodwork

## 2013-10-07 NOTE — Telephone Encounter (Signed)
Coming in July 7th for visit. Wants to know if you want him to have labs done before. Please advise

## 2013-10-08 DIAGNOSIS — R5381 Other malaise: Secondary | ICD-10-CM | POA: Diagnosis not present

## 2013-10-08 DIAGNOSIS — E785 Hyperlipidemia, unspecified: Secondary | ICD-10-CM | POA: Diagnosis not present

## 2013-10-08 DIAGNOSIS — E559 Vitamin D deficiency, unspecified: Secondary | ICD-10-CM | POA: Diagnosis not present

## 2013-10-08 DIAGNOSIS — R7301 Impaired fasting glucose: Secondary | ICD-10-CM | POA: Diagnosis not present

## 2013-10-09 LAB — CBC WITH DIFFERENTIAL/PLATELET
BASOS ABS: 0.1 10*3/uL (ref 0.0–0.1)
Basophils Relative: 1 % (ref 0–1)
EOS ABS: 0.2 10*3/uL (ref 0.0–0.7)
EOS PCT: 4 % (ref 0–5)
HEMATOCRIT: 38.2 % — AB (ref 39.0–52.0)
Hemoglobin: 12.6 g/dL — ABNORMAL LOW (ref 13.0–17.0)
LYMPHS PCT: 34 % (ref 12–46)
Lymphs Abs: 1.8 10*3/uL (ref 0.7–4.0)
MCH: 29.4 pg (ref 26.0–34.0)
MCHC: 33 g/dL (ref 30.0–36.0)
MCV: 89.3 fL (ref 78.0–100.0)
MONO ABS: 0.4 10*3/uL (ref 0.1–1.0)
Monocytes Relative: 8 % (ref 3–12)
Neutro Abs: 2.8 10*3/uL (ref 1.7–7.7)
Neutrophils Relative %: 53 % (ref 43–77)
Platelets: 298 10*3/uL (ref 150–400)
RBC: 4.28 MIL/uL (ref 4.22–5.81)
RDW: 12.9 % (ref 11.5–15.5)
WBC: 5.3 10*3/uL (ref 4.0–10.5)

## 2013-10-09 LAB — HEPATIC FUNCTION PANEL
ALT: 15 U/L (ref 0–53)
AST: 22 U/L (ref 0–37)
Albumin: 4 g/dL (ref 3.5–5.2)
Alkaline Phosphatase: 42 U/L (ref 39–117)
BILIRUBIN DIRECT: 0.2 mg/dL (ref 0.0–0.3)
BILIRUBIN TOTAL: 0.8 mg/dL (ref 0.2–1.2)
Indirect Bilirubin: 0.6 mg/dL (ref 0.2–1.2)
Total Protein: 6.6 g/dL (ref 6.0–8.3)

## 2013-10-09 LAB — LIPID PANEL
CHOL/HDL RATIO: 2.8 ratio
Cholesterol: 126 mg/dL (ref 0–200)
HDL: 45 mg/dL (ref >39)
LDL CALC: 70 mg/dL (ref 0–99)
Triglycerides: 55 mg/dL (ref <150)
VLDL: 11 mg/dL (ref 0–40)

## 2013-10-09 LAB — GLUCOSE, RANDOM: GLUCOSE: 89 mg/dL (ref 70–99)

## 2013-10-09 LAB — VITAMIN D 25 HYDROXY (VIT D DEFICIENCY, FRACTURES): Vit D, 25-Hydroxy: 32 ng/mL (ref 30–89)

## 2013-10-13 ENCOUNTER — Encounter: Payer: Self-pay | Admitting: Family Medicine

## 2013-10-13 ENCOUNTER — Ambulatory Visit (INDEPENDENT_AMBULATORY_CARE_PROVIDER_SITE_OTHER): Payer: Medicare Other | Admitting: Family Medicine

## 2013-10-13 ENCOUNTER — Encounter (INDEPENDENT_AMBULATORY_CARE_PROVIDER_SITE_OTHER): Payer: Self-pay

## 2013-10-13 VITALS — BP 120/74 | HR 61 | Resp 16 | Ht 69.0 in | Wt 230.1 lb

## 2013-10-13 DIAGNOSIS — Z0000 Encounter for general adult medical examination without abnormal findings: Secondary | ICD-10-CM | POA: Diagnosis not present

## 2013-10-13 DIAGNOSIS — M899 Disorder of bone, unspecified: Secondary | ICD-10-CM

## 2013-10-13 DIAGNOSIS — Z23 Encounter for immunization: Secondary | ICD-10-CM | POA: Diagnosis not present

## 2013-10-13 DIAGNOSIS — M949 Disorder of cartilage, unspecified: Secondary | ICD-10-CM

## 2013-10-13 DIAGNOSIS — R5381 Other malaise: Secondary | ICD-10-CM

## 2013-10-13 DIAGNOSIS — Z125 Encounter for screening for malignant neoplasm of prostate: Secondary | ICD-10-CM | POA: Diagnosis not present

## 2013-10-13 DIAGNOSIS — R5383 Other fatigue: Secondary | ICD-10-CM

## 2013-10-13 DIAGNOSIS — E785 Hyperlipidemia, unspecified: Secondary | ICD-10-CM

## 2013-10-13 NOTE — Progress Notes (Signed)
Subjective:    Patient ID: Terry Love, male    DOB: 1935-04-22, 78 y.o.   MRN: [id number]  HPI Preventive Screening-Counseling & Management   Patient present here today for a Medicare annual wellness visit.   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 No  Over the past two weeks, have you felt little interest or pleasure in doing things? No  Have you lost interest or pleasure in daily life? No  Do you often feel hopeless? No  Do you cry easily over simple problems? No   Activities of Daily Living  In your present state of health, do you have any difficulty performing the following activities?  Driving?: No Managing money?: No Feeding yourself?:No Getting from bed to chair?:No Climbing a flight of stairs?:No Preparing food and eating?:No Bathing or showering?:No Getting dressed?:No Getting to the toilet?:No Using the toilet?:No Moving around from place to place?: No  Fall Risk Assessment In the past year have you fallen or had a near fall?:No Are you currently taking any medications that make you dizzy?:No   Hearing Difficulties: yes traumatic injury to right ear during the  service Do you often ask people to speak up or repeat themselves?:wearing right hearing aid, has a left aid also but does not wear it generally due to hyper acusis with both in place Do you experience ringing or noises in your ears?:No Do you have difficulty understanding soft or whispered voices?:No  Cognitive Testing  Alert? Yes Normal Appearance?Yes  Oriented to person? Yes Place? Yes  Time? Yes  Displays appropriate judgment?Yes  Can read the correct time from a watch face? yes Are you having problems remembering things?No  Advanced Directives have been discussed with the patient?Yes , full code   List the Names of Other Physician/Practitioners you currently use: Dr Gala Romney   Indicate any recent Medical Services you may have received from other than [facility] providers in the past year (date may be approximate).   Assessment:    Annual Wellness Exam   Plan:      Patient Instructions (the written plan) was given to the patient.  Medicare Attestation  I have personally reviewed:  The patient's medical and social history  Their use of alcohol, tobacco or illicit drugs  Their current medications and supplements  The patient's functional ability including ADLs,fall risks, home safety risks, cognitive, and hearing and visual impairment  Diet and physical activities  Evidence for depression or mood disorders  The patient's weight, height, BMI, and visual acuity have been recorded in the chart. I have made referrals, counseling, and provided education to the patient based on review of the above and I have provided  the patient with a written personalized care plan for preventive services.      Review of Systems     Objective:   Physical Exam        Assessment & Plan:  Routine general medical examination at a health care facility Annual exam as documented. Counseling done  re healthy lifestyle involving commitment to 150 minutes exercise per week, heart healthy diet, and  attaining healthy weight.The importance of adequate sleep also discussed. Regular seat belt use and safe storage  of firearms if patient has them, is also discussed. Changes in health habits are decided on by the patient with goals and time frames  set for achieving them. Immunization and cancer screening needs are specifically addressed at this visit. Prenvar administered at visit

## 2013-10-13 NOTE — Patient Instructions (Addendum)
F/u in November, call for flu vaccine in September please  Prenvar today  You are doing extremely well for 64 years young,I hope that you continue to enjoy an excellent quality of life   Labs are excellent  Please work on  Weight loss by committing to 30 mins of exercise every day, and reducing the amt of carbohydrate you eat, ENJOY the corn!  PSA, TSH and Vit D, hepatic, non fasting  Oct 29 or after

## 2013-10-18 NOTE — Assessment & Plan Note (Signed)
Annual exam as documented. Counseling done  re healthy lifestyle involving commitment to 150 minutes exercise per week, heart healthy diet, and attaining healthy weight.The importance of adequate sleep also discussed. Regular seat belt use and safe storage  of firearms if patient has them, is also discussed. Changes in health habits are decided on by the patient with goals and time frames  set for achieving them. Immunization and cancer screening needs are specifically addressed at this visit. Prenvar administered at visit

## 2013-10-23 DIAGNOSIS — Z0000 Encounter for general adult medical examination without abnormal findings: Secondary | ICD-10-CM | POA: Diagnosis not present

## 2013-12-31 DIAGNOSIS — Z23 Encounter for immunization: Secondary | ICD-10-CM | POA: Diagnosis not present

## 2014-02-04 DIAGNOSIS — Z125 Encounter for screening for malignant neoplasm of prostate: Secondary | ICD-10-CM | POA: Diagnosis not present

## 2014-02-04 DIAGNOSIS — E785 Hyperlipidemia, unspecified: Secondary | ICD-10-CM | POA: Diagnosis not present

## 2014-02-04 DIAGNOSIS — E559 Vitamin D deficiency, unspecified: Secondary | ICD-10-CM | POA: Diagnosis not present

## 2014-02-05 LAB — HEPATIC FUNCTION PANEL
ALBUMIN: 4.2 g/dL (ref 3.5–5.2)
ALT: 17 U/L (ref 0–53)
AST: 22 U/L (ref 0–37)
Alkaline Phosphatase: 44 U/L (ref 39–117)
Bilirubin, Direct: 0.2 mg/dL (ref 0.0–0.3)
Indirect Bilirubin: 0.5 mg/dL (ref 0.2–1.2)
TOTAL PROTEIN: 6.6 g/dL (ref 6.0–8.3)
Total Bilirubin: 0.7 mg/dL (ref 0.2–1.2)

## 2014-02-05 LAB — TSH: TSH: 0.725 u[IU]/mL (ref 0.350–4.500)

## 2014-02-05 LAB — PSA, MEDICARE: PSA: 0.11 ng/mL (ref <=4.00)

## 2014-02-05 LAB — VITAMIN D 25 HYDROXY (VIT D DEFICIENCY, FRACTURES): Vit D, 25-Hydroxy: 28 ng/mL — ABNORMAL LOW (ref 30–89)

## 2014-02-15 ENCOUNTER — Encounter: Payer: Self-pay | Admitting: Family Medicine

## 2014-02-15 ENCOUNTER — Encounter (INDEPENDENT_AMBULATORY_CARE_PROVIDER_SITE_OTHER): Payer: Self-pay

## 2014-02-15 ENCOUNTER — Ambulatory Visit (INDEPENDENT_AMBULATORY_CARE_PROVIDER_SITE_OTHER): Payer: Medicare Other | Admitting: Family Medicine

## 2014-02-15 VITALS — BP 114/68 | HR 56 | Resp 16 | Ht 69.0 in | Wt 227.0 lb

## 2014-02-15 DIAGNOSIS — H9190 Unspecified hearing loss, unspecified ear: Secondary | ICD-10-CM | POA: Diagnosis not present

## 2014-02-15 DIAGNOSIS — E669 Obesity, unspecified: Secondary | ICD-10-CM | POA: Diagnosis not present

## 2014-02-15 DIAGNOSIS — E66811 Obesity, class 1: Secondary | ICD-10-CM

## 2014-02-15 DIAGNOSIS — F418 Other specified anxiety disorders: Secondary | ICD-10-CM | POA: Diagnosis not present

## 2014-02-15 DIAGNOSIS — Z6830 Body mass index (BMI) 30.0-30.9, adult: Secondary | ICD-10-CM | POA: Diagnosis not present

## 2014-02-15 DIAGNOSIS — E559 Vitamin D deficiency, unspecified: Secondary | ICD-10-CM | POA: Diagnosis not present

## 2014-02-15 DIAGNOSIS — Z720 Tobacco use: Secondary | ICD-10-CM

## 2014-02-15 DIAGNOSIS — F17210 Nicotine dependence, cigarettes, uncomplicated: Secondary | ICD-10-CM | POA: Diagnosis not present

## 2014-02-15 DIAGNOSIS — F17200 Nicotine dependence, unspecified, uncomplicated: Secondary | ICD-10-CM

## 2014-02-15 DIAGNOSIS — E785 Hyperlipidemia, unspecified: Secondary | ICD-10-CM | POA: Diagnosis not present

## 2014-02-15 DIAGNOSIS — R7301 Impaired fasting glucose: Secondary | ICD-10-CM

## 2014-02-15 MED ORDER — ATORVASTATIN CALCIUM 40 MG PO TABS: 40.0000 mg | ORAL_TABLET | Freq: Every day | ORAL | Status: DC

## 2014-02-15 MED ORDER — ERGOCALCIFEROL 1.25 MG (50000 UT) PO CAPS: 50000.0000 [IU] | ORAL_CAPSULE | ORAL | Status: DC

## 2014-02-15 NOTE — Assessment & Plan Note (Signed)
Controlled, no change in medication Hyperlipidemia:Low fat diet discussed and encouraged.  Updated lab needed at/ before next visit.  

## 2014-02-15 NOTE — Assessment & Plan Note (Signed)
3 oz twice weekly Patient counseled for approximately 5 minutes regarding the health risks of ongoing nicotine use, specifically all types of cancer, heart disease, stroke and respiratory failure. The options available for help with cessation ,the behavioral changes to assist the process, and the option to either gradully reduce usage  Or abruptly stop.is also discussed. Pt is also encouraged to set specific goals in number of cigarettes used daily, as well as to set a quit date.

## 2014-02-15 NOTE — Patient Instructions (Signed)
F/u in 4 month, call if you need me before  You are up to date with all cancer screening and immunization which is excellent  NEW medication for vit D in once weekly  Vit D, continue ALL medications you are taking, (calcium with D and centrum)  Continue to keep busy, keep active lifestyle with good eating habits  Please work on continuing to cut back on chewing tobacco with a plan to quitting altogether, this will improve your health  Fasting lipid, cmp, Vit D in 4 month

## 2014-02-15 NOTE — Assessment & Plan Note (Signed)
Improved. Pt applauded on succesful weight loss through lifestyle change, and encouraged to continue same. Weight loss goal set for the next several months.  

## 2014-02-15 NOTE — Assessment & Plan Note (Signed)
Controlled, no change in medication  

## 2014-02-15 NOTE — Progress Notes (Signed)
   Subjective:    Patient ID: Terry Love, male    DOB: 10-Nov-1935, 78 y.o.   MRN: [id number]  HPI The PT is here for follow up and re-evaluation of chronic medical conditions, medication management and review of any available recent lab and radiology data.  Preventive health is updated, specifically  Cancer screening and Immunization.   Questions or concerns regarding consultations or procedures which the PT has had in the interim are  addressed. The PT denies any adverse reactions to current medications since the last visit.  There are no new concerns.  There are no specific complaints       Review of Systems See HPI Denies recent fever or chills. Denies sinus pressure, nasal congestion, ear pain or sore throat. Denies chest congestion, productive cough or wheezing. Denies chest pains, palpitations and leg swelling Denies abdominal pain, nausea, vomiting,diarrhea or constipation.   Denies dysuria, frequency, hesitancy or incontinence. Denies joint pain, swelling and limitation in mobility. Denies headaches, seizures, numbness, or tingling. Denies depression, anxiety or insomnia. Denies skin break down or rash.        Objective:   Physical Exam BP 114/68 mmHg  Pulse 56  Resp 16  Ht 5\' 9"  (1.753 m)  Wt 227 lb (102.967 kg)  BMI 33.51 kg/m2  SpO2 98% Patient alert and oriented and in no cardiopulmonary distress.  HEENT: No facial asymmetry, EOMI,   oropharynx pink and moist.  Neck supple no JVD, no mass.  Chest: Clear to auscultation bilaterally.  CVS: S1, S2 no murmurs, no S3.Regular rate.  ABD: Soft non tender.   Ext: No edema  MS: Adequate ROM spine, shoulders, hips and knees.  Skin: Intact, no ulcerations or rash noted.  Psych: Good eye contact, normal affect. Memory intact not anxious or depressed appearing.  CNS: CN 2-12 intact, power,  normal throughout.no focal deficits noted.        Assessment & Plan:  Depression with anxiety Controlled,  no change in medication   Hyperlipidemia LDL goal <100 Controlled, no change in medication Hyperlipidemia:Low fat diet discussed and encouraged.  Updated lab needed at/ before next visit.;   Obesity (BMI 30.0-34.9) Improved. Pt applauded on succesful weight loss through lifestyle change, and encouraged to continue same. Weight loss goal set for the next several months.   SMOKELESS TOBACCO ABUSE 3 oz twice weekly Patient counseled for approximately 5 minutes regarding the health risks of ongoing nicotine use, specifically all types of cancer, heart disease, stroke and respiratory failure. The options available for help with cessation ,the behavioral changes to assist the process, and the option to either gradully reduce usage  Or abruptly stop.is also discussed. Pt is also encouraged to set specific goals in number of cigarettes used daily, as well as to set a quit date.   Vitamin D deficiency Start weekly vit D   Hearing loss Improved with use of 1 hearing aid which is still being adjusted, got this from the [place]

## 2014-02-20 ENCOUNTER — Encounter (HOSPITAL_COMMUNITY): Payer: Self-pay

## 2014-02-20 ENCOUNTER — Emergency Department (HOSPITAL_COMMUNITY)
Admission: EM | Admit: 2014-02-20 | Discharge: 2014-02-20 | Disposition: A | Discharge status: Home / Self Care | Payer: Medicare Other | Attending: Emergency Medicine | Admitting: Emergency Medicine

## 2014-02-20 DIAGNOSIS — Z79899 Other long term (current) drug therapy: Secondary | ICD-10-CM | POA: Diagnosis not present

## 2014-02-20 DIAGNOSIS — Y9389 Activity, other specified: Secondary | ICD-10-CM | POA: Diagnosis not present

## 2014-02-20 DIAGNOSIS — R002 Palpitations: Secondary | ICD-10-CM | POA: Insufficient documentation

## 2014-02-20 DIAGNOSIS — Z7982 Long term (current) use of aspirin: Secondary | ICD-10-CM | POA: Diagnosis not present

## 2014-02-20 DIAGNOSIS — R001 Bradycardia, unspecified: Secondary | ICD-10-CM | POA: Diagnosis not present

## 2014-02-20 DIAGNOSIS — S61219A Laceration without foreign body of unspecified finger without damage to nail, initial encounter: Secondary | ICD-10-CM

## 2014-02-20 DIAGNOSIS — F419 Anxiety disorder, unspecified: Secondary | ICD-10-CM | POA: Diagnosis not present

## 2014-02-20 DIAGNOSIS — Y9289 Other specified places as the place of occurrence of the external cause: Secondary | ICD-10-CM | POA: Diagnosis not present

## 2014-02-20 DIAGNOSIS — E669 Obesity, unspecified: Secondary | ICD-10-CM | POA: Diagnosis not present

## 2014-02-20 DIAGNOSIS — W458XXA Other foreign body or object entering through skin, initial encounter: Secondary | ICD-10-CM | POA: Insufficient documentation

## 2014-02-20 DIAGNOSIS — Y998 Other external cause status: Secondary | ICD-10-CM | POA: Insufficient documentation

## 2014-02-20 DIAGNOSIS — Z86010 Personal history of colonic polyps: Secondary | ICD-10-CM | POA: Diagnosis not present

## 2014-02-20 DIAGNOSIS — Z23 Encounter for immunization: Secondary | ICD-10-CM | POA: Insufficient documentation

## 2014-02-20 DIAGNOSIS — S61211A Laceration without foreign body of left index finger without damage to nail, initial encounter: Secondary | ICD-10-CM | POA: Diagnosis not present

## 2014-02-20 DIAGNOSIS — E785 Hyperlipidemia, unspecified: Secondary | ICD-10-CM | POA: Insufficient documentation

## 2014-02-20 MED ORDER — POVIDONE-IODINE 10 % EX SOLN
CUTANEOUS | Status: AC
  Administered 2014-02-20: 19:00:00
  Filled 2014-02-20: qty 118

## 2014-02-20 MED ORDER — TETANUS-DIPHTH-ACELL PERTUSSIS 5-2.5-18.5 LF-MCG/0.5 IM SUSP
0.5000 mL | Freq: Once | INTRAMUSCULAR | Status: AC
  Administered 2014-02-20: 0.5 mL via INTRAMUSCULAR
  Filled 2014-02-20: qty 0.5

## 2014-02-20 MED ORDER — LIDOCAINE HCL (PF) 1 % IJ SOLN
5.0000 mL | Freq: Once | INTRAMUSCULAR | Status: AC
  Administered 2014-02-20: 5 mL
  Filled 2014-02-20: qty 5

## 2014-02-20 NOTE — Discharge Instructions (Signed)
Please keep the wound clean and dry. Please have your sutures removed in 7 to 10 days. Please see your primary physician, or return to the emergency department if any signs of infection. Sutured Wound Care Sutures are stitches that can be used to close wounds. Wound care helps prevent pain and infection.  HOME CARE INSTRUCTIONS   Rest and elevate the injured area until all the pain and swelling are gone.  Only take over-the-counter or prescription medicines for pain, discomfort, or fever as directed by your caregiver.  After 48 hours, gently wash the area with mild soap and water once a day, or as directed. Rinse off the soap. Pat the area dry with a clean towel. Do not rub the wound. This may cause bleeding.  Follow your caregiver's instructions for how often to change the bandage (dressing). Stop using a dressing after 2 days or after the wound stops draining.  If the dressing sticks, moisten it with soapy water and gently remove it.  Apply ointment on the wound as directed.  Avoid stretching a sutured wound.  Drink enough fluids to keep your urine clear or pale yellow.  Follow up with your caregiver for suture removal as directed.  Use sunscreen on your wound for the next 3 to 6 months so the scar will not darken. SEEK IMMEDIATE MEDICAL CARE IF:   Your wound becomes red, swollen, hot, or tender.  You have increasing pain in the wound.  You have a red streak that extends from the wound.  There is pus coming from the wound.  You have a fever.  You have shaking chills.  There is a bad smell coming from the wound.  You have persistent bleeding from the wound. MAKE SURE YOU:   Understand these instructions.  Will watch your condition.  Will get help right away if you are not doing well or get worse. Document Released: 05/03/2004 Document Revised: 06/18/2011 Document Reviewed: 07/30/2010 [facility] Patient Information 2015 [place], [place]. This information is not intended  to replace advice given to you by your health care provider. Make sure you discuss any questions you have with your health care provider.

## 2014-02-20 NOTE — ED Notes (Signed)
While cutting "collard greens" patient "sliced his finger". Bleeding under control. Sensory intact. Patient able to move/bend his finger. No bone or ligaments seen on my examination.

## 2014-02-20 NOTE — ED Provider Notes (Signed)
Patient suffered laceration to left index finger this afternoon. On exam patient has flap laceration left index finger, proximal phalanx, finger with full range of motion. Good capillary refill sensation intact  Terry Dakin, MD 02/20/14 [phone]

## 2014-02-20 NOTE — ED Provider Notes (Signed)
CSN: [id number]     Arrival date & time 02/20/14  1709 History   First MD Initiated Contact with Patient 02/20/14 1751     Chief Complaint  Patient presents with  . Extremity Laceration     (Consider location/radiation/quality/duration/timing/severity/associated sxs/prior Treatment) HPI Comments: Patient is a 78 year old male who presents to the emergency department with complaint of laceration to the left index finger. The patient states that he was cutting collards when he accidentally cut the left index finger. He applied pressure and was able to get the bleeding controlled. The patient denies any problem using the finger. He denies being on any anticoagulation medications. There's been no previous operations or procedures involving the left hand. The patient is unsure of the date of his last tetanus. He has not taken any medication for this injury.  The history is provided by the patient.    Past Medical History  Diagnosis Date  . Bradycardia     Chronic   . Obesity   . Hyperlipidemia   . Facial tic   . Seasonal allergies   . Anxiety   . Colon polyps    Past Surgical History  Procedure Laterality Date  . Carpal tunnel  release right  2006  . Colonoscopy    . Colonoscopy  01/25/2011    Dr. Rehman:Examination performed to cecum Pan colonic diverticulosis/2 small polyps ablated via cold biopsy from transverse colon/External hemorrhoids, benign polyps  . Colonoscopy N/A 06/29/2013    Procedure: COLONOSCOPY;  Surgeon: Daneil Dolin, MD;  Location: [facility];  Service: Endoscopy;  Laterality: N/A;  10:45   Family History  Problem Relation Age of Onset  . Aneurysm Father   . Colon cancer Mother     diagnosed at age 48  . Stroke Brother    History  Substance Use Topics  . Smoking status: Never Smoker   . Smokeless tobacco: Former Systems developer    Types: Chew    Quit date: 09/24/2012  . Alcohol Use: Yes     Comment: occasional    Review of Systems  Constitutional: Negative  for activity change.       All ROS Neg except as noted in HPI  HENT: Negative for nosebleeds.   Eyes: Negative for photophobia and discharge.  Respiratory: Negative for cough, shortness of breath and wheezing.   Cardiovascular: Positive for palpitations. Negative for chest pain.  Gastrointestinal: Negative for abdominal pain and blood in stool.  Genitourinary: Negative for dysuria, frequency and hematuria.  Musculoskeletal: Positive for arthralgias. Negative for back pain and neck pain.  Skin: Negative.   Neurological: Negative for dizziness, seizures and speech difficulty.  Psychiatric/Behavioral: Negative for hallucinations and confusion.      Allergies  Review of patient's allergies indicates no known allergies.  Home Medications   Prior to Admission medications   Medication Sig Start Date End Date Taking? Authorizing Provider  citalopram (CELEXA) 20 MG tablet Take 20 mg by mouth daily.   Yes Historical Provider, MD  aspirin (BAYER ASPIRIN EC LOW DOSE) 81 MG EC tablet Take 81 mg by mouth daily.      Historical Provider, MD  atorvastatin (LIPITOR) 40 MG tablet Take 1 tablet (40 mg total) by mouth at bedtime. 02/15/14   Fayrene Helper, MD  calcium-vitamin D (OSCAL WITH D) 500-200 MG-UNIT per tablet Take 1 tablet by mouth 3 (three) times daily.     Historical Provider, MD  citalopram (CELEXA) 20 MG tablet TAKE 1 TABLET (20 MG TOTAL) BY MOUTH  DAILY. Patient not taking: Reported on 02/20/2014 02/17/13   Fayrene Helper, MD  ergocalciferol (VITAMIN D2) 50000 UNITS capsule Take 1 capsule (50,000 Units total) by mouth once a week. One capsule once weekly 02/15/14   Fayrene Helper, MD  Multiple Vitamin (MULTIVITAMIN) tablet Take 1 tablet by mouth daily. Unspecified frequency    Historical Provider, MD  naproxen sodium (ANAPROX) 220 MG tablet Take 220 mg by mouth as needed.    Historical Provider, MD  Wheat Dextrin (BENEFIBER DRINK MIX PO) Take by mouth. Use as directed daily     Historical Provider, MD   BP 132/61 mmHg  Pulse 50  Temp(Src) 98.2 F (36.8 C) (Oral)  Resp 16  Ht 5\' 10"  (1.778 m)  Wt 224 lb (101.606 kg)  BMI 32.14 kg/m2  SpO2 98% Physical Exam  Constitutional: He is oriented to person, place, and time. He appears well-developed and well-nourished.  Non-toxic appearance.  HENT:  Head: Normocephalic.  Right Ear: Tympanic membrane and external ear normal.  Left Ear: Tympanic membrane and external ear normal.  Hearing aids in place  Eyes: EOM and lids are normal. Pupils are equal, round, and reactive to light.  Neck: Normal range of motion. Neck supple. Carotid bruit is not present.  Cardiovascular: Regular rhythm, normal heart sounds, intact distal pulses and normal pulses.  Bradycardia present.   Pulmonary/Chest: Breath sounds normal. No respiratory distress.  Abdominal: Soft. Bowel sounds are normal. There is no tenderness. There is no guarding.  Musculoskeletal: Normal range of motion.       Hands: FROM of all fingers of the left hand. No tendon or bone involvement. 2.0 cm laceration of the dorsal-lateral left index finger  Lymphadenopathy:       Head (right side): No submandibular adenopathy present.       Head (left side): No submandibular adenopathy present.    He has no cervical adenopathy.  Neurological: He is alert and oriented to person, place, and time. He has normal strength. No cranial nerve deficit or sensory deficit.  Skin: Skin is warm and dry.  Psychiatric: He has a normal mood and affect. His speech is normal.  Nursing note and vitals reviewed.   ED Course  Pt seen with me by Dr. Lenna Sciara..  LACERATION REPAIR Date/Time: 02/20/2014 6:51 PM Performed by: Lenox Ahr Authorized by: Lenox Ahr Consent: Verbal consent obtained. Risks and benefits: risks, benefits and alternatives were discussed Consent given by: patient Patient understanding: patient states understanding of the procedure being performed Patient  identity confirmed: arm band Time out: Immediately prior to procedure a "time out" was called to verify the correct patient, procedure, equipment, support staff and site/side marked as required. Body area: upper extremity Location details: left index finger Laceration length: 2 cm Foreign bodies: no foreign bodies Tendon involvement: none Nerve involvement: none Vascular damage: no Anesthesia: digital block Local anesthetic: lidocaine 1% without epinephrine Patient sedated: no Preparation: Patient was prepped and draped in the usual sterile fashion. Irrigation solution: saline Amount of cleaning: standard Degree of undermining: none Skin closure: 4-0 nylon Number of sutures: 6 Technique: simple Approximation: close Approximation difficulty: simple Dressing: gauze roll Patient tolerance: Patient tolerated the procedure well with no immediate complications   (including critical care time) Labs Review Labs Reviewed - No data to display  Imaging Review No results found.   EKG Interpretation None      MDM  Patient sustained a laceration to the left index finger tonight with a knife. Tetanus  was updated. No evidence for neurovascular compromise. Laceration was repaired with 6 interrupted sutures of 4-0 nylon.  Plan-patient is to have the sutures removed in 7-10 days. He will return sooner if any changes or signs of infection.   Final diagnoses:  Laceration of finger, initial encounter    **I have reviewed nursing notes, vital signs, and all appropriate lab and imaging results for this patient.Lenox Ahr, PA-C 02/20/14 [place], MD 02/20/14 1859

## 2014-02-22 NOTE — Assessment & Plan Note (Signed)
Improved with use of 1 hearing aid which is still being adjusted, got this from the [place]

## 2014-02-22 NOTE — Assessment & Plan Note (Signed)
Start weekly vit D 

## 2014-03-02 ENCOUNTER — Ambulatory Visit (INDEPENDENT_AMBULATORY_CARE_PROVIDER_SITE_OTHER): Payer: Medicare Other | Admitting: Family Medicine

## 2014-03-02 ENCOUNTER — Encounter: Payer: Self-pay | Admitting: Family Medicine

## 2014-03-02 VITALS — BP 134/72 | HR 60 | Resp 18 | Ht 72.0 in | Wt 227.1 lb

## 2014-03-02 DIAGNOSIS — S61218D Laceration without foreign body of other finger without damage to nail, subsequent encounter: Secondary | ICD-10-CM

## 2014-03-02 NOTE — Patient Instructions (Signed)
RETURN FOR NURSE TO REMOVE REMAINING 2 SUTURES ON 03/08/2014 AT 9AM  kEEP AREA CLEAN AND DRY USE SMALL AMT OF ANTIBIOTIC OINTMENT  TO AREA AROUND WOUND TWICE DAILY, KEEP LIGHT DRY GAUZE PROTECTIVE COVERING OVER THE LACERATION SO IT IS NOT RE INJURED , NOR DOES IT GET INFECTED  SO FAR, SO GOOD!

## 2014-03-07 DIAGNOSIS — S61218A Laceration without foreign body of other finger without damage to nail, initial encounter: Secondary | ICD-10-CM | POA: Insufficient documentation

## 2014-03-07 NOTE — Assessment & Plan Note (Signed)
Two sutures remain, skin stilkl not as well approximated as desired, pt to return in 5 days for removal of remaining 2 sutures. No evidence of superinfection Advised to keep wound clean, dry , and loosely covered

## 2014-03-07 NOTE — Progress Notes (Signed)
   Subjective:    Patient ID: Terry Love, male    DOB: 18-Mar-1936, 78 y.o.   MRN: [id number]  HPI Pt in for review and removal of suture to left index finger placed in the ED on 02/20/2014. No significant pain, erythema or drainage from the wound, however, area where sutures were initially removed is not well approximated , nor is the area where the residual 2 sutures are   Review of Systems    See HPI  Objective:   Physical Exam  BP 134/72 mmHg  Pulse 60  Resp 18  Ht 6' (1.829 m)  Wt 227 lb 1.9 oz (103.021 kg)  BMI 30.80 kg/m2  SpO2 98% Patient alert and oriented and in no cardiopulmonary distress.Pt in no pain  HEENT: No facial asymmetry, EOMI,   oropharynx pink and moist.  Neck supple no JVD, no mass.  Chest: Clear to auscultation bilaterally.  CVS: S1, S2 no murmurs, no S3.Regular rate.   Ext: No edema    Skin: Laceration to left index, still gaping where remaining 2 sutures are, no purulent drainage or surrounding eryhtema  .       Assessment & Plan:

## 2014-03-08 ENCOUNTER — Ambulatory Visit: Payer: Medicare Other

## 2014-04-29 ENCOUNTER — Other Ambulatory Visit: Payer: Self-pay | Admitting: Family Medicine

## 2014-06-14 DIAGNOSIS — E559 Vitamin D deficiency, unspecified: Secondary | ICD-10-CM | POA: Diagnosis not present

## 2014-06-14 DIAGNOSIS — E785 Hyperlipidemia, unspecified: Secondary | ICD-10-CM | POA: Diagnosis not present

## 2014-06-14 DIAGNOSIS — E669 Obesity, unspecified: Secondary | ICD-10-CM | POA: Diagnosis not present

## 2014-06-14 LAB — COMPREHENSIVE METABOLIC PANEL
ALBUMIN: 4.1 g/dL (ref 3.5–5.2)
ALK PHOS: 44 U/L (ref 39–117)
ALT: 14 U/L (ref 0–53)
AST: 20 U/L (ref 0–37)
BUN: 14 mg/dL (ref 6–23)
CALCIUM: 9.5 mg/dL (ref 8.4–10.5)
CO2: 29 meq/L (ref 19–32)
Chloride: 102 mEq/L (ref 96–112)
Creat: 0.82 mg/dL (ref 0.50–1.35)
GLUCOSE: 108 mg/dL — AB (ref 70–99)
POTASSIUM: 4.5 meq/L (ref 3.5–5.3)
SODIUM: 137 meq/L (ref 135–145)
TOTAL PROTEIN: 6.9 g/dL (ref 6.0–8.3)
Total Bilirubin: 0.6 mg/dL (ref 0.2–1.2)

## 2014-06-14 LAB — LIPID PANEL
Cholesterol: 124 mg/dL (ref 0–200)
HDL: 50 mg/dL (ref >=40)
LDL CALC: 63 mg/dL (ref 0–99)
Total CHOL/HDL Ratio: 2.5 Ratio
Triglycerides: 53 mg/dL (ref <150)
VLDL: 11 mg/dL (ref 0–40)

## 2014-06-15 DIAGNOSIS — R7301 Impaired fasting glucose: Secondary | ICD-10-CM | POA: Diagnosis not present

## 2014-06-15 LAB — VITAMIN D 25 HYDROXY (VIT D DEFICIENCY, FRACTURES): Vit D, 25-Hydroxy: 29 ng/mL — ABNORMAL LOW (ref 30–100)

## 2014-06-15 NOTE — Addendum Note (Signed)
Addended by: Eual Fines on: 06/15/2014 10:26 AM   Modules accepted: Orders

## 2014-06-16 LAB — HEMOGLOBIN A1C
HEMOGLOBIN A1C: 5.6 % (ref <5.7)
Mean Plasma Glucose: 114 mg/dL (ref <117)

## 2014-06-23 ENCOUNTER — Ambulatory Visit (INDEPENDENT_AMBULATORY_CARE_PROVIDER_SITE_OTHER): Payer: Medicare Other | Admitting: Family Medicine

## 2014-06-23 ENCOUNTER — Encounter: Payer: Self-pay | Admitting: Family Medicine

## 2014-06-23 VITALS — BP 120/72 | HR 57 | Resp 16 | Ht 72.0 in | Wt 229.0 lb

## 2014-06-23 DIAGNOSIS — F17200 Nicotine dependence, unspecified, uncomplicated: Secondary | ICD-10-CM | POA: Diagnosis not present

## 2014-06-23 DIAGNOSIS — E669 Obesity, unspecified: Secondary | ICD-10-CM

## 2014-06-23 DIAGNOSIS — H9193 Unspecified hearing loss, bilateral: Secondary | ICD-10-CM | POA: Diagnosis not present

## 2014-06-23 DIAGNOSIS — E785 Hyperlipidemia, unspecified: Secondary | ICD-10-CM | POA: Diagnosis not present

## 2014-06-23 DIAGNOSIS — H9191 Unspecified hearing loss, right ear: Secondary | ICD-10-CM

## 2014-06-23 DIAGNOSIS — F418 Other specified anxiety disorders: Secondary | ICD-10-CM

## 2014-06-23 DIAGNOSIS — R001 Bradycardia, unspecified: Secondary | ICD-10-CM | POA: Diagnosis not present

## 2014-06-23 DIAGNOSIS — Z6831 Body mass index (BMI) 31.0-31.9, adult: Secondary | ICD-10-CM | POA: Diagnosis not present

## 2014-06-23 NOTE — Patient Instructions (Addendum)
Physical exam in 4 month, call if you need me before  Please continue to work on quitting chewing tobacco  Reduce citalopram to half daily, if this works as well for you  It is important that you exercise regularly at least 30 minutes 5 times a week. If you develop chest pain, have severe difficulty breathing, or feel very tired, stop exercising immediately and seek medical attention  .  You will have your initial hearing eval  As soon as possible

## 2014-06-23 NOTE — Assessment & Plan Note (Signed)
Reports cutting back, encouraged to quit Patient counseled for approximately 5 minutes regarding the health risks of ongoing nicotine use, specifically all types of cancer, heart disease, stroke and respiratory failure. The options available for help with cessation ,the behavioral changes to assist the process, and the option to either gradully reduce usage  Or abruptly stop.is also discussed. Pt is also encouraged to set specific goals in number of cigarettes used daily, as well as to set a quit date.

## 2014-06-23 NOTE — Assessment & Plan Note (Signed)
Reports poor ability to understand/ process the spoken word despite the use of haring aids, seeking expert opinion on the whether a different and much more expensive hearing aid would address this, he is referred to NT for same. Current hearing aids are from the [place]

## 2014-06-23 NOTE — Assessment & Plan Note (Signed)
Unchanged , pt asymptomatic

## 2014-06-23 NOTE — Assessment & Plan Note (Signed)
Controlled, med dose reduced to 10 mg daily trial of taper per pt request, he will break the 20 mg tab in half

## 2014-06-23 NOTE — Assessment & Plan Note (Signed)
Unchanged. Patient re-educated about  the importance of commitment to a  minimum of 150 minutes of exercise per week. The importance of healthy food choices with portion control discussed. Encouraged to start a food diary, count calories and to consider  joining a support group. Sample diet sheets offered. Goals set by the patient for the next several months.    

## 2014-06-23 NOTE — Progress Notes (Signed)
Subjective:    Patient ID: Terry Love, male    DOB: 1935/07/12, 79 y.o.   MRN: [id number]  HPI The PT is here for follow up and re-evaluation of chronic medical conditions, medication management and review of any available recent lab and radiology data.  Preventive health is updated, specifically  Cancer screening and Immunization.   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 Requests trial of reduction in citalopram dose, has tried unsuccessfully in the past but certainly worth a trial. Continues to be careful with diet and intends to increase activity with season chnage There are no new concerns.  Pt and his wife state he is having difficulty processing the spoken word and the feeling / hope is that with better hearing aids this will improve    Review of Systems See HPI Denies recent fever or chills. Denies sinus pressure, nasal congestion, ear pain or sore throat. Denies chest congestion, productive cough or wheezing. Denies chest pains, palpitations and leg swelling Denies abdominal pain, nausea, vomiting,diarrhea or constipation.   Denies dysuria, frequency, hesitancy or incontinence. Denies joint pain, swelling and limitation in mobility. Denies headaches, seizures, numbness, or tingling. Denies depression, anxiety or insomnia. Denies skin break down or rash.        Objective:   Physical Exam BP 120/72 mmHg  Pulse 57  Resp 16  Ht 6' (1.829 m)  Wt 229 lb (103.874 kg)  BMI 31.05 kg/m2  SpO2 99% Patient alert and oriented and  in no cardiopulmonary distress.  HEENT: No facial asymmetry, EOMI,   oropharynx pink and moist.  Neck supple no JVD, no mass.  Chest: Clear to auscultation bilaterally.  CVS: S1, S2 no murmurs, no S3.Regular rate.  ABD: Soft non tender.   Ext: No edema  MS: Adequate ROM spine, shoulders, hips and knees.  Skin: Intact, no ulcerations or rash noted.  Psych: Good eye contact, normal affect. Memory intact not anxious or depressed appearing.  CNS: CN 2-12 intact, power,  normal throughout.no focal deficits noted.        Assessment & Plan:  Hyperlipidemia LDL goal <100 Controlled, no change in medication Hyperlipidemia:Low fat diet discussed and encouraged.     Hearing loss Reports poor ability to understand/ process the spoken word despite the use of haring aids, seeking expert opinion on the whether a different and much more expensive hearing aid would address this, he is referred to NT for same. Current hearing aids are from the [place]   Depression with anxiety Controlled, med dose reduced to 10 mg daily trial of taper per pt request, he will break the 20 mg tab in half    Obesity (BMI 30.0-34.9) Unchanged. Patient re-educated about  the importance of commitment to a  minimum of 150 minutes of exercise per week. The importance of healthy food choices with portion control discussed. Encouraged to start a food diary, count calories and to consider  joining a support group. Sample diet sheets offered. Goals set by the patient for the next several months.  SMOKELESS TOBACCO ABUSE Reports cutting back, encouraged to quit Patient counseled for approximately 5 minutes regarding the health risks of ongoing nicotine use, specifically all types of cancer, heart disease, stroke and respiratory failure. The options available for help with cessation ,the behavioral changes to assist the process, and the option to either gradully reduce usage  Or abruptly stop.is also  discussed. Pt is also encouraged to set specific goals in number of cigarettes used daily, as well as to set a quit date.    Bradycardia Unchanged , pt asymptomatic

## 2014-06-23 NOTE — Assessment & Plan Note (Signed)
Controlled, no change in medication Hyperlipidemia:Low fat diet discussed and encouraged.  \ 

## 2014-06-28 DIAGNOSIS — H903 Sensorineural hearing loss, bilateral: Secondary | ICD-10-CM | POA: Diagnosis not present

## 2014-07-12 ENCOUNTER — Other Ambulatory Visit: Payer: Self-pay | Admitting: Family Medicine

## 2014-08-21 ENCOUNTER — Other Ambulatory Visit: Payer: Self-pay | Admitting: Family Medicine

## 2014-09-07 DIAGNOSIS — T162XXA Foreign body in left ear, initial encounter: Secondary | ICD-10-CM | POA: Diagnosis not present

## 2014-10-12 ENCOUNTER — Telehealth: Payer: Self-pay | Admitting: *Deleted

## 2014-10-12 NOTE — Telephone Encounter (Signed)
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

## 2014-10-13 NOTE — Telephone Encounter (Signed)
Spoke with wife she is aware that Dr Moshe Cipro does not do these anymore and that Urgent Care will

## 2014-10-13 NOTE — Telephone Encounter (Signed)
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

## 2014-10-28 ENCOUNTER — Telehealth: Payer: Self-pay

## 2014-10-28 DIAGNOSIS — D649 Anemia, unspecified: Secondary | ICD-10-CM

## 2014-10-28 DIAGNOSIS — R7301 Impaired fasting glucose: Secondary | ICD-10-CM

## 2014-10-28 DIAGNOSIS — E785 Hyperlipidemia, unspecified: Secondary | ICD-10-CM

## 2014-10-28 NOTE — Telephone Encounter (Signed)
Lab order sent to lab

## 2014-10-29 DIAGNOSIS — R7301 Impaired fasting glucose: Secondary | ICD-10-CM | POA: Diagnosis not present

## 2014-10-29 DIAGNOSIS — E785 Hyperlipidemia, unspecified: Secondary | ICD-10-CM | POA: Diagnosis not present

## 2014-10-29 DIAGNOSIS — D649 Anemia, unspecified: Secondary | ICD-10-CM | POA: Diagnosis not present

## 2014-10-29 LAB — COMPLETE METABOLIC PANEL WITH GFR
ALT: 14 U/L (ref 0–53)
AST: 27 U/L (ref 0–37)
Albumin: 4 g/dL (ref 3.5–5.2)
Alkaline Phosphatase: 38 U/L — ABNORMAL LOW (ref 39–117)
BILIRUBIN TOTAL: 0.8 mg/dL (ref 0.2–1.2)
BUN: 16 mg/dL (ref 6–23)
CHLORIDE: 106 meq/L (ref 96–112)
CO2: 26 mEq/L (ref 19–32)
Calcium: 9.2 mg/dL (ref 8.4–10.5)
Creat: 0.75 mg/dL (ref 0.50–1.35)
GFR, Est Non African American: 88 mL/min
GLUCOSE: 99 mg/dL (ref 70–99)
POTASSIUM: 4.1 meq/L (ref 3.5–5.3)
Sodium: 142 mEq/L (ref 135–145)
TOTAL PROTEIN: 6.7 g/dL (ref 6.0–8.3)

## 2014-10-29 LAB — CBC WITH DIFFERENTIAL/PLATELET
Basophils Absolute: 0.1 10*3/uL (ref 0.0–0.1)
Basophils Relative: 1 % (ref 0–1)
EOS PCT: 5 % (ref 0–5)
Eosinophils Absolute: 0.3 10*3/uL (ref 0.0–0.7)
HCT: 38 % — ABNORMAL LOW (ref 39.0–52.0)
Hemoglobin: 12.7 g/dL — ABNORMAL LOW (ref 13.0–17.0)
Lymphocytes Relative: 37 % (ref 12–46)
Lymphs Abs: 1.9 10*3/uL (ref 0.7–4.0)
MCH: 30.3 pg (ref 26.0–34.0)
MCHC: 33.4 g/dL (ref 30.0–36.0)
MCV: 90.7 fL (ref 78.0–100.0)
MONO ABS: 0.5 10*3/uL (ref 0.1–1.0)
MPV: 9.2 fL (ref 8.6–12.4)
Monocytes Relative: 10 % (ref 3–12)
NEUTROS PCT: 47 % (ref 43–77)
Neutro Abs: 2.4 10*3/uL (ref 1.7–7.7)
PLATELETS: 277 10*3/uL (ref 150–400)
RBC: 4.19 MIL/uL — ABNORMAL LOW (ref 4.22–5.81)
RDW: 12.6 % (ref 11.5–15.5)
WBC: 5.1 10*3/uL (ref 4.0–10.5)

## 2014-10-29 LAB — LIPID PANEL
CHOLESTEROL: 134 mg/dL (ref 0–200)
HDL: 50 mg/dL (ref >=40)
LDL Cholesterol: 74 mg/dL (ref 0–99)
TRIGLYCERIDES: 52 mg/dL (ref <150)
Total CHOL/HDL Ratio: 2.7 Ratio
VLDL: 10 mg/dL (ref 0–40)

## 2014-10-29 LAB — HEMOGLOBIN A1C
HEMOGLOBIN A1C: 5.6 % (ref <5.7)
Mean Plasma Glucose: 114 mg/dL (ref <117)

## 2014-11-03 ENCOUNTER — Encounter: Payer: Self-pay | Admitting: Family Medicine

## 2014-11-03 ENCOUNTER — Ambulatory Visit (INDEPENDENT_AMBULATORY_CARE_PROVIDER_SITE_OTHER): Payer: Medicare Other | Admitting: Family Medicine

## 2014-11-03 VITALS — BP 120/66 | HR 54 | Resp 18 | Ht 72.0 in | Wt 231.0 lb

## 2014-11-03 DIAGNOSIS — Z0000 Encounter for general adult medical examination without abnormal findings: Secondary | ICD-10-CM

## 2014-11-03 DIAGNOSIS — Z720 Tobacco use: Secondary | ICD-10-CM | POA: Diagnosis not present

## 2014-11-03 DIAGNOSIS — F17200 Nicotine dependence, unspecified, uncomplicated: Secondary | ICD-10-CM

## 2014-11-03 DIAGNOSIS — L819 Disorder of pigmentation, unspecified: Secondary | ICD-10-CM

## 2014-11-03 DIAGNOSIS — R001 Bradycardia, unspecified: Secondary | ICD-10-CM

## 2014-11-03 DIAGNOSIS — Z1211 Encounter for screening for malignant neoplasm of colon: Secondary | ICD-10-CM | POA: Diagnosis not present

## 2014-11-03 LAB — HEMOCCULT GUIAC POC 1CARD (OFFICE): Fecal Occult Blood, POC: NEGATIVE

## 2014-11-03 MED ORDER — ATORVASTATIN CALCIUM 40 MG PO TABS: 40.0000 mg | ORAL_TABLET | Freq: Every day | ORAL | Status: DC

## 2014-11-03 NOTE — Progress Notes (Signed)
   Subjective:    Patient ID: Terry Love, male    DOB: 26-Jun-1935, 79 y.o.   MRN: [id number]  HPI Patient is in for annual physical exam. No other health concerns are expressed or addressed at the visiPt does have concerns about the processing of words which he intends to follow up with at the [place], le are reviewed. Immunization is reviewed , and  updated if needed.   Review of Systems See HPI     Objective:   Physical Exam BP 120/66 mmHg  Pulse 54  Resp 18  Ht 6' (1.829 m)  Wt 231 lb 0.6 oz (104.799 kg)  BMI 31.33 kg/m2  SpO2 96% Pleasant mildly obese male, alert and oriented x 3, in no cardio-pulmonary distress. Afebrile. HEENT No facial trauma or asymetry. Sinuses non tender. EOMI, External ears normal, tympanic membranes clear. Oropharynx moist, no exudate,fair dentition.Dentures Neck: supple, no adenopathy,JVD or thyromegaly.No bruits.  Chest: Clear to ascultation bilaterally.No crackles or wheezes. Non tender to palpation  Breast: No asymetry,no masses. No nipple discharge or inversion. No axillary or supraclavicular adenopathy  Cardiovascular system; Heart sounds normal,  S1 and  S2 ,no S3.  No murmur, or thrill. Apical beat not displaced Peripheral pulses normal.  Abdomen: Soft, non tender, no organomegaly or masses. No bruits. Bowel sounds normal. No guarding, tenderness or rebound.  Rectal:  Normal sphincter tone. No hemorrhoids or  masses. guaiac negative stool. Prostate smooth and firm  GU: Not examined.  Musculoskeletal exam: Full ROM of spine, hips , shoulders and knees. No deformity ,swelling or crepitus noted. No muscle wasting or atrophy.   Neurologic: Cranial nerves 2 to 12 intact.Bilateral hearing loss Power, tone ,sensation and reflexes normal throughout. No disturbance in gait. No tremor.  Skin: Intact, no scaling rash. Hyperpigmented lesion on right mid back, irregular border, maximum diameter approx 1.5  in Psych; Normal mood and affect. Judgement and concentration normal         Assessment & Plan:  Annual physical exam Annual exam as documented. Counseling done  re healthy lifestyle involving commitment to 150 minutes exercise per week, heart healthy diet, and attaining healthy weight.The importance of adequate sleep also discussed. Regular seat belt use and home safety, is also discussed. Changes in health habits are decided on by the patient with goals and time frames  set for achieving them. Immunization and cancer screening needs are specifically addressed at this visit.   SMOKELESS TOBACCO ABUSE Cutting back on use of snuff, has plans to quit Patient counseled for approximately 5 minutes regarding the health risks of ongoing nicotine use, specifically all types of cancer, heart disease, stroke and respiratory failure. The options available for help with cessation ,the behavioral changes to assist the process, and the option to either gradully reduce usage  Or abruptly stop.is also discussed. Pt is also encouraged to set specific goals in amount of nicotine used daily, as well as to set a quit date.     Hyperpigmented skin lesion Noted on upper posterior rigth back, irregular border, pt reports it being present for years, advised to have derm eval

## 2014-11-03 NOTE — Assessment & Plan Note (Signed)

## 2014-11-03 NOTE — Patient Instructions (Addendum)
Annual wellness in 4 month, call if you need me before  Recent blood work is excellent, circulation in feet is normal on exam  Rectal exam is normal  Please have the medical specialist/ [facility] at the [facility] evaluate youfurther as far as processing of spoken words  You have a dark lesion on the left upper back which is irregular in shape, although there for a long time, I recommend dermatology evaluate it, a biopsy may be indicated to make sure that there is no problem wuith it  Please let me know  If you want me to arrange for local dermatology

## 2014-11-14 ENCOUNTER — Encounter: Payer: Self-pay | Admitting: Family Medicine

## 2014-11-14 DIAGNOSIS — L819 Disorder of pigmentation, unspecified: Secondary | ICD-10-CM | POA: Insufficient documentation

## 2014-11-14 DIAGNOSIS — R001 Bradycardia, unspecified: Secondary | ICD-10-CM | POA: Insufficient documentation

## 2014-11-14 NOTE — Assessment & Plan Note (Signed)
Cutting back on use of snuff, has plans to quit Patient counseled for approximately 5 minutes regarding the health risks of ongoing nicotine use, specifically all types of cancer, heart disease, stroke and respiratory failure. The options available for help with cessation ,the behavioral changes to assist the process, and the option to either gradully reduce usage  Or abruptly stop.is also discussed. Pt is also encouraged to set specific goals in amount of nicotine used daily, as well as to set a quit date.

## 2014-11-14 NOTE — Assessment & Plan Note (Signed)
Noted on upper posterior rigth back, irregular border, pt reports it being present for years, advised to have derm eval

## 2014-12-11 ENCOUNTER — Other Ambulatory Visit: Payer: Self-pay | Admitting: Family Medicine

## 2014-12-27 ENCOUNTER — Other Ambulatory Visit: Payer: Self-pay | Admitting: Family Medicine

## 2015-01-14 DIAGNOSIS — Z23 Encounter for immunization: Secondary | ICD-10-CM | POA: Diagnosis not present

## 2015-03-10 DIAGNOSIS — F5105 Insomnia due to other mental disorder: Secondary | ICD-10-CM

## 2015-03-10 DIAGNOSIS — F418 Other specified anxiety disorders: Secondary | ICD-10-CM

## 2015-03-10 HISTORY — DX: Insomnia due to other mental disorder: F51.05

## 2015-03-10 HISTORY — DX: Other specified anxiety disorders: F41.8

## 2015-03-25 ENCOUNTER — Other Ambulatory Visit: Payer: Self-pay | Admitting: Family Medicine

## 2015-03-30 DIAGNOSIS — H903 Sensorineural hearing loss, bilateral: Secondary | ICD-10-CM | POA: Diagnosis not present

## 2015-04-07 ENCOUNTER — Encounter: Payer: Self-pay | Admitting: Family Medicine

## 2015-04-07 ENCOUNTER — Ambulatory Visit (INDEPENDENT_AMBULATORY_CARE_PROVIDER_SITE_OTHER): Payer: Medicare Other | Admitting: Family Medicine

## 2015-04-07 VITALS — BP 130/74 | HR 55 | Resp 16 | Ht 72.0 in | Wt 233.4 lb

## 2015-04-07 DIAGNOSIS — F418 Other specified anxiety disorders: Secondary | ICD-10-CM

## 2015-04-07 DIAGNOSIS — H9193 Unspecified hearing loss, bilateral: Secondary | ICD-10-CM

## 2015-04-07 DIAGNOSIS — H93299 Other abnormal auditory perceptions, unspecified ear: Secondary | ICD-10-CM

## 2015-04-07 DIAGNOSIS — Z0000 Encounter for general adult medical examination without abnormal findings: Secondary | ICD-10-CM | POA: Diagnosis not present

## 2015-04-07 MED ORDER — ATORVASTATIN CALCIUM 40 MG PO TABS: 40.0000 mg | ORAL_TABLET | Freq: Every day | ORAL | Status: DC

## 2015-04-07 MED ORDER — TRAZODONE HCL 50 MG PO TABS: 50.0000 mg | ORAL_TABLET | Freq: Every evening | ORAL | Status: DC | PRN

## 2015-04-07 MED ORDER — CITALOPRAM HYDROBROMIDE 20 MG PO TABS: ORAL_TABLET | ORAL | Status: DC

## 2015-04-07 NOTE — Progress Notes (Signed)
Subjective:    Patient ID: Terry Love, male    DOB: [birth year], 79 y.o.   MRN: [id number]  HPI Preventive Screening-Counseling & Management   Patient present here today for a Medicare annual wellness visit.   Current Problems (verified)   Medications Prior to Visit Allergies (verified)   PAST HISTORY  Family History (verified)   Social History Married since 1963. No children. Retired Technical brewer for [employer]. Never smoker    Risk Factors  Current exercise habits:  Some exercise- stays busy, tries to walk daily when able   Dietary issues discussed: heart healthy diet, encouraged to eat more fruits and vegetables and limit fried fatty foods   Cardiac risk factors: hyperlipidemia   Depression Screen  (Note: if answer to either of the following is "Yes", a more complete depression screening is indicated)   Over the past two weeks, have you felt down, depressed or hopeless? Yes   Over the past two weeks, have you felt little interest or pleasure in doing things? yes  Have you lost interest or pleasure in daily life? Some  Do you often feel hopeless? No  Do you cry easily over simple problems? No   Activities of Daily Living  In your present state of health, do you have any difficulty performing the following activities?  Driving?: No Managing money?: No Feeding yourself?:No Getting from bed to chair?:No Climbing a flight of stairs?:No Preparing food and eating?:No Bathing or showering?:No Getting dressed?:No Getting to the toilet?:No Using the toilet?:No Moving around from place to place?: No  Fall Risk Assessment In the past year have you fallen or had a near fall?:No Are you currently taking any medications that make you dizzy:No   Hearing Difficulties: No Do you often ask people to speak up or repeat themselves?:yes. Wears aids Do you experience ringing or noises in your ears?:No Do you have difficulty understanding soft or whispered  voices?:yes  Cognitive Testing  Alert? Yes Normal Appearance?Yes  Oriented to person? Yes Place? Yes  Time? Yes  Displays appropriate judgment?Yes  Can read the correct time from a watch face? yes Are you having problems remembering things?No  Advanced Directives have been discussed with the patient?Yes   List the Names of Other Physician/Practitioners you currently use:  [facility] [place] [facility] well   Indicate any recent Medical Services you may have received from other than [facility] providers in the past year (date may be approximate).   Assessment:    Annual Wellness Exam   Plan:     Medicare Attestation  I have personally reviewed:  The patient's medical and social history  Their use of alcohol, tobacco or illicit drugs  Their current medications and supplements  The patient's functional ability including ADLs,fall risks, home safety risks, cognitive, and hearing and visual impairment  Diet and physical activities  Evidence for depression or mood disorders  The patient's weight, height, BMI, and visual acuity have been recorded in the chart. I have made referrals, counseling, and provided education to the patient based on review of the above and I have provided the patient with a written personalized care plan for preventive services.      Review of Systems     Objective:   Physical Exam BP 130/74 mmHg  Pulse 55  Resp 16  Ht 6' (1.829 m)  Wt 233 lb 6.4 oz (105.87 kg)  BMI 31.65 kg/m2  SpO2 96% Tearful , flat affect, anxious at times  Psych: depressed and anxious  mood with insomnia.Terrified due to increased isolation and lack of ability to function dur to hearing loss complicated by deterioration in ability to comprehnd the spoken word and to localize the origin of sound Denies suicidal or homicidal ideation, no hallucinations      Assessment & Plan:  Medicare annual wellness visit, subsequent Annual exam as documented. Counseling done  re healthy lifestyle  involving commitment to 150 minutes exercise per week, heart healthy diet, and attaining healthy weight.The importance of adequate sleep also discussed. Regular seat belt use and home safety, is also discussed. Changes in health habits are decided on by the patient with goals and time frames  set for achieving them. Immunization and cancer screening needs are specifically addressed at this visit.   Depression with anxiety Deteriorated due to impairment in hearing as well as comprehension of the spoken word. Tearful, states he is unaware of how he will be able to live without independent movement Trazodone added to help with sleep nd depression, continue citalopram as before  Hearing loss Significant deterioration noted in ability to function as a result of hearing loss. Wife present ,  tearfull along with her spouse. Both report increased difficulty in locating direction from which sound is originating, difficulty both i hearing as wellas understanding the spoken word Patient's hearing loss is related to [employer], he has been evaluated at the [place], as wellas with a local ENT Doc to d  See if he can get any help to help in particular with his comprehension of the spoken word, will refer to  [facility] Will order MRI brain also

## 2015-04-07 NOTE — Patient Instructions (Signed)
F/u in 10 weeks, call if you need me sooner  New additional medication to help with sleep at night , tarzodone 50 mg, contnue citalopram as before  Please start going to the gym daily for regular exercise and to help mental and physical healtj  See if there is some volunteer activity that you will enjoy starting even once per week as you start feeling better  I TRULY hope that there is some help for you as far as improving your ability to understand the spoken word, which you ar losing as a result of hearing loss. I am referring you to [facility]  Please check drivers license status again, since comprehension of sound and direction from which it is coming is reported as compromised. YOUR SAFETY and SAFETY of  Other drivers and pedestrians and cyclists is paramoi=unt1. You will have help to get around if you need it, do not worry, in life it is as important to graciously accept help as to give it!  Please schedule eye exam as soon as possible  All the best for 2017!  Thanks for choosing [facility], we consider it a privelige to serve you.

## 2015-04-07 NOTE — Assessment & Plan Note (Signed)

## 2015-04-11 ENCOUNTER — Encounter: Payer: Self-pay | Admitting: Family Medicine

## 2015-04-11 NOTE — Assessment & Plan Note (Signed)
Deteriorated due to impairment in hearing as well as comprehension of the spoken word. Tearful, states he is unaware of how he will be able to live without independent movement Trazodone added to help with sleep nd depression, continue citalopram as before

## 2015-04-11 NOTE — Assessment & Plan Note (Addendum)
Significant deterioration noted in ability to function as a result of hearing loss. Wife present ,  tearfull along with her spouse. Both report increased difficulty in locating direction from which sound is originating, difficulty both i hearing as wellas understanding the spoken word Patient's hearing loss is related to [employer], he has been evaluated at the [place], as wellas with a local ENT Doc to d  See if he can get any help to help in particular with his comprehension of the spoken word, will refer to  [facility] Will order MRI brain also

## 2015-04-13 ENCOUNTER — Telehealth: Payer: Self-pay | Admitting: Family Medicine

## 2015-04-13 NOTE — Telephone Encounter (Signed)
Patient is complaining of cough, congestion and some wheezing since Sunday,  he is asking for something to be called into the pharmacy to help

## 2015-04-14 ENCOUNTER — Other Ambulatory Visit: Payer: Self-pay | Admitting: Family Medicine

## 2015-04-14 MED ORDER — PREDNISONE 5 MG PO TABS: 5.0000 mg | ORAL_TABLET | Freq: Two times a day (BID) | ORAL | Status: AC

## 2015-04-14 MED ORDER — BENZONATATE 100 MG PO CAPS: 100.0000 mg | ORAL_CAPSULE | Freq: Two times a day (BID) | ORAL | Status: DC | PRN

## 2015-04-14 NOTE — Telephone Encounter (Signed)
Patient aware.  No fever noted within the last 48 hours

## 2015-04-14 NOTE — Telephone Encounter (Signed)
Patient complaining of productive cough x 5 days.  He has tried Mucinex and Coracidin hbp.  He does have audible wheezing with inspiration.  Is asking if medication can be sent to [facility].  Please advise.

## 2015-04-14 NOTE — Telephone Encounter (Signed)
pls verify and document lack of fever , and explain why no antibiotic , only symptomatic meds sent, prednisone and tessalon perles,they are already sent

## 2015-05-28 ENCOUNTER — Other Ambulatory Visit: Payer: Self-pay | Admitting: Family Medicine

## 2015-06-16 ENCOUNTER — Encounter: Payer: Self-pay | Admitting: Family Medicine

## 2015-06-16 ENCOUNTER — Other Ambulatory Visit: Payer: Self-pay

## 2015-06-16 ENCOUNTER — Ambulatory Visit (HOSPITAL_COMMUNITY)
Admission: RE | Admit: 2015-06-16 | Discharge: 2015-06-16 | Disposition: A | Discharge status: Home / Self Care | Payer: Medicare Other | Source: Ambulatory Visit | Attending: Family Medicine | Admitting: Family Medicine

## 2015-06-16 ENCOUNTER — Ambulatory Visit (INDEPENDENT_AMBULATORY_CARE_PROVIDER_SITE_OTHER): Payer: Medicare Other | Admitting: Family Medicine

## 2015-06-16 VITALS — BP 120/76 | HR 54 | Resp 16 | Ht 72.0 in | Wt 230.4 lb

## 2015-06-16 DIAGNOSIS — E785 Hyperlipidemia, unspecified: Secondary | ICD-10-CM | POA: Diagnosis not present

## 2015-06-16 DIAGNOSIS — E669 Obesity, unspecified: Secondary | ICD-10-CM | POA: Insufficient documentation

## 2015-06-16 DIAGNOSIS — D649 Anemia, unspecified: Secondary | ICD-10-CM

## 2015-06-16 DIAGNOSIS — R05 Cough: Secondary | ICD-10-CM

## 2015-06-16 DIAGNOSIS — R0989 Other specified symptoms and signs involving the circulatory and respiratory systems: Secondary | ICD-10-CM | POA: Diagnosis not present

## 2015-06-16 DIAGNOSIS — F418 Other specified anxiety disorders: Secondary | ICD-10-CM

## 2015-06-16 DIAGNOSIS — R053 Chronic cough: Secondary | ICD-10-CM

## 2015-06-16 DIAGNOSIS — E559 Vitamin D deficiency, unspecified: Secondary | ICD-10-CM

## 2015-06-16 DIAGNOSIS — F5105 Insomnia due to other mental disorder: Secondary | ICD-10-CM

## 2015-06-16 DIAGNOSIS — Z125 Encounter for screening for malignant neoplasm of prostate: Secondary | ICD-10-CM | POA: Diagnosis not present

## 2015-06-16 DIAGNOSIS — I517 Cardiomegaly: Secondary | ICD-10-CM | POA: Insufficient documentation

## 2015-06-16 DIAGNOSIS — R001 Bradycardia, unspecified: Secondary | ICD-10-CM | POA: Insufficient documentation

## 2015-06-16 DIAGNOSIS — F419 Anxiety disorder, unspecified: Secondary | ICD-10-CM

## 2015-06-16 DIAGNOSIS — J3089 Other allergic rhinitis: Secondary | ICD-10-CM

## 2015-06-16 DIAGNOSIS — H9193 Unspecified hearing loss, bilateral: Secondary | ICD-10-CM

## 2015-06-16 LAB — LIPID PANEL
CHOL/HDL RATIO: 2.3 ratio (ref <=5.0)
Cholesterol: 122 mg/dL — ABNORMAL LOW (ref 125–200)
HDL: 52 mg/dL (ref >=40)
LDL CALC: 57 mg/dL (ref <130)
TRIGLYCERIDES: 65 mg/dL (ref <150)
VLDL: 13 mg/dL (ref <30)

## 2015-06-16 LAB — CBC
HEMATOCRIT: 38.7 % — AB (ref 39.0–52.0)
HEMOGLOBIN: 12.7 g/dL — AB (ref 13.0–17.0)
MCH: 30 pg (ref 26.0–34.0)
MCHC: 32.8 g/dL (ref 30.0–36.0)
MCV: 91.5 fL (ref 78.0–100.0)
MPV: 9.7 fL (ref 8.6–12.4)
Platelets: 300 10*3/uL (ref 150–400)
RBC: 4.23 MIL/uL (ref 4.22–5.81)
RDW: 12.4 % (ref 11.5–15.5)
WBC: 5.1 10*3/uL (ref 4.0–10.5)

## 2015-06-16 LAB — COMPREHENSIVE METABOLIC PANEL
ALT: 20 U/L (ref 9–46)
AST: 25 U/L (ref 10–35)
Albumin: 4 g/dL (ref 3.6–5.1)
Alkaline Phosphatase: 42 U/L (ref 40–115)
BUN: 12 mg/dL (ref 7–25)
CHLORIDE: 104 mmol/L (ref 98–110)
CO2: 26 mmol/L (ref 20–31)
Calcium: 9.3 mg/dL (ref 8.6–10.3)
Creat: 0.77 mg/dL (ref 0.70–1.18)
Glucose, Bld: 94 mg/dL (ref 65–99)
POTASSIUM: 4.2 mmol/L (ref 3.5–5.3)
Sodium: 139 mmol/L (ref 135–146)
TOTAL PROTEIN: 7 g/dL (ref 6.1–8.1)
Total Bilirubin: 0.8 mg/dL (ref 0.2–1.2)

## 2015-06-16 MED ORDER — MONTELUKAST SODIUM 10 MG PO TABS: 10.0000 mg | ORAL_TABLET | Freq: Every day | ORAL | Status: DC

## 2015-06-16 NOTE — Assessment & Plan Note (Signed)
Improved but needs to take trazodone daily and commit to daily exercise

## 2015-06-16 NOTE — Patient Instructions (Addendum)
Annual wellness in 2.5 month, call if you need me sooner  EKG and CXR today  You are referred to cardiology for furhter evaluation of your heart due to increased fatigue, and slow heart rate  Labs today   New medication for cough and allergies is singulair take one every day     Please commit to exercise eveery day for 30 minutes this also improves wellbeing  Thanks for choosing [facility], we consider it a privelige to serve you.

## 2015-06-16 NOTE — Assessment & Plan Note (Signed)
2 month h/o cough and sputum with fatigue, and allergy symptoms, start singulaair and CXR

## 2015-06-16 NOTE — Progress Notes (Signed)
   Subjective:    Patient ID: Terry Love, male    DOB: 1936/02/21, 80 y.o.   MRN: [id number]  HPI Pt in for follow up, this time unaccompanied however , wife has sent a note of her concerns. Pt reports ongoing cough for 2 months, scant sputum , sometimes thick . C/o increased fatigue with activity, . Denies chest pain, palpitation, PND , orthopnea or leg swelling , has chronic bradycardia Decided to put off his appt at [facility] for further clarification , or 3rd opinion on his hearing loss and increased difficulty in understanding the spoken word. States he had a schedule conflict with the [place] and decided [facility] keep [facility]   Review of Systems See HPI Denies recent fever or chills. Denies sinus pressure, c/o increased  nasal congestion,denies  ear pain or sore throat. Denies abdominal pain, nausea, vomiting,diarrhea or constipation.   Denies dysuria, frequency, hesitancy or incontinence. Denies joint pain, swelling and limitation in mobility. Denies headaches, seizures, numbness, or tingling. Denies  Uncontrolled depression or anxiety still struggling with insomnia however , not consistently taking trazodone, though he realizes that it is helpful, encouraged to do so, and he will Denies skin break down or rash.        Objective:   Physical Exam BP 120/76 mmHg  Pulse 54  Resp 16  Ht 6' (1.829 m)  Wt 230 lb 6.4 oz (104.509 kg)  BMI 31.24 kg/m2  SpO2 97% Patient alert and oriented and in no cardiopulmonary distress.  HEENT: No facial asymmetry, EOMI,   oropharynx pink and moist.  Neck supple no JVD, no mass. TM clear,  No sinus tenderness, erythema and edema of nasal mucosa, mild conjunctival injection Chest: Clear to auscultation bilaterally.  CVS: S1, S2 no murmurs, no S3.Regular rate. EKG: sinus brady rate is 44, no ischemia , no LVH  ABD: Soft non tender.   Ext: No edema  MS: Adequate ROM spine, shoulders, hips and knees.  Skin: Intact, no ulcerations or rash  noted.  Psych: Good eye contact, normal affect. Memory intact not anxious or depressed appearing.  CNS: CN 2-12 intact, power,  normal throughout.no focal deficits noted.       Assessment & Plan:  Sinus bradycardia, chronic Increased exertional fatigue in past 2 months, chronic bradycardia EKG today and cardiology eval Of note, pt's mother required a pacemaker   Chronic cough 2 month h/o cough and sputum with fatigue, and allergy symptoms, start singulaair and CXR  Depression with anxiety Improved but needs to take trazodone daily and commit to daily exercise  Hyperlipidemia LDL goal <100 Hyperlipidemia:Low fat diet discussed and encouraged.   Lipid Panel  Lab Results  Component Value Date   CHOL 122* 06/16/2015   HDL 52 06/16/2015   LDLCALC 57 06/16/2015   TRIG 65 06/16/2015   CHOLHDL 2.3 06/16/2015      Controlled, no change in medication   Allergic rhinitis Uncontrolled with chronic cough, start singulair daily, and CXR today  [facility]  Insomnia secondary to anxiety Sleep hygiene reviewed and written information offered also. Prescription sent for  medication needed.

## 2015-06-16 NOTE — Assessment & Plan Note (Addendum)
Increased exertional fatigue in past 2 months, chronic bradycardia EKG today and cardiology eval Of note, pt's mother required a pacemaker

## 2015-06-17 LAB — PSA, MEDICARE: PSA: 0.12 ng/mL (ref <=4.00)

## 2015-06-17 LAB — VITAMIN D 25 HYDROXY (VIT D DEFICIENCY, FRACTURES): Vit D, 25-Hydroxy: 44 ng/mL (ref 30–100)

## 2015-06-19 DIAGNOSIS — F5105 Insomnia due to other mental disorder: Secondary | ICD-10-CM

## 2015-06-19 DIAGNOSIS — F419 Anxiety disorder, unspecified: Secondary | ICD-10-CM | POA: Insufficient documentation

## 2015-06-19 NOTE — Assessment & Plan Note (Signed)
Sleep hygiene reviewed and written information offered also. Prescription sent for  medication needed.  

## 2015-06-19 NOTE — Assessment & Plan Note (Signed)
currently being evaluated through the [place]

## 2015-06-19 NOTE — Assessment & Plan Note (Signed)
Uncontrolled with chronic cough, start singulair daily, and CXR today

## 2015-06-19 NOTE — Assessment & Plan Note (Signed)
Hyperlipidemia:Low fat diet discussed and encouraged.   Lipid Panel  Lab Results  Component Value Date   CHOL 122* 06/16/2015   HDL 52 06/16/2015   LDLCALC 57 06/16/2015   TRIG 65 06/16/2015   CHOLHDL 2.3 06/16/2015      Controlled, no change in medication

## 2015-06-20 ENCOUNTER — Ambulatory Visit (INDEPENDENT_AMBULATORY_CARE_PROVIDER_SITE_OTHER): Payer: Medicare Other | Admitting: Internal Medicine

## 2015-06-20 ENCOUNTER — Encounter: Payer: Self-pay | Admitting: Internal Medicine

## 2015-06-20 VITALS — BP 126/70 | HR 60 | Ht 70.0 in | Wt 231.0 lb

## 2015-06-20 DIAGNOSIS — R011 Cardiac murmur, unspecified: Secondary | ICD-10-CM | POA: Diagnosis not present

## 2015-06-20 DIAGNOSIS — R001 Bradycardia, unspecified: Secondary | ICD-10-CM

## 2015-06-20 DIAGNOSIS — I498 Other specified cardiac arrhythmias: Secondary | ICD-10-CM | POA: Diagnosis not present

## 2015-06-20 LAB — TSH: TSH: 0.89 mIU/L (ref 0.40–4.50)

## 2015-06-20 NOTE — Progress Notes (Signed)
Cardiology Office Note   Date:  06/20/2015   ID:  Terry, Love Nov 17, 1935, MRN [id number]  PCP:  Tula Nakayama, MD  Cardiologist:   Dorris Carnes, MD   Pt referred by Dr Moshe Cipro for eval of bradycardia     History of Present Illness: Terry Love is a 80 y.o. male with a history of HL and chronic sinus bradycardia  Followed by Bari Mantis.  EKG on 3/9 SB 43 bpm  PR interval 248 msec.   Jan 1 Pt ago the flu  Since then,  hasnt felt right   Breathing has been shorter over the past few years.  Last time felt self was December  2016 Around house OK No dizziness  No syncope NO CP   Outpatient Prescriptions Prior to Visit  Medication Sig Dispense Refill  . aspirin (BAYER ASPIRIN EC LOW DOSE) 81 MG EC tablet Take 81 mg by mouth daily.      Marland Kitchen atorvastatin (LIPITOR) 40 MG tablet Take 1 tablet (40 mg total) by mouth at bedtime. 90 tablet 1  . calcium-vitamin D (OSCAL WITH D) 500-200 MG-UNIT per tablet Take 1 tablet by mouth 3 (three) times daily.     . citalopram (CELEXA) 20 MG tablet TAKE 1 TABLET (20 MG TOTAL) BY MOUTH DAILY. 90 tablet 1  . montelukast (SINGULAIR) 10 MG tablet Take 1 tablet (10 mg total) by mouth at bedtime. 30 tablet 3  . Multiple Vitamin (MULTIVITAMIN) tablet Take 1 tablet by mouth daily. Unspecified frequency    . traZODone (DESYREL) 50 MG tablet Take 1 tablet (50 mg total) by mouth at bedtime as needed for sleep. 30 tablet 1  . Vitamin D, Ergocalciferol, (DRISDOL) 50000 units CAPS capsule TAKE ONE CAPSULE BY MOUTH A WEEK 12 capsule 1  . Wheat Dextrin (BENEFIBER DRINK MIX PO) Take by mouth. Use as directed daily     Facility-Administered Medications Prior to Visit  Medication Dose Route Frequency Provider Last Rate Last Dose  . Influenza (>/= 3 years) inactive virus vaccine (FLVIRIN/FLUZONE) injection SUSP 0.5 mL  0.5 mL Intramuscular Once Fayrene Helper, MD         Allergies:   Review of patient's allergies indicates no known allergies.   Past  Medical History  Diagnosis Date  . Bradycardia     Chronic   . Obesity   . Hyperlipidemia   . Facial tic   . Seasonal allergies   . Anxiety   . Colon polyps   . Hearing loss   . Depression   . Insomnia secondary to depression with anxiety 03/2015    Past Surgical History  Procedure Laterality Date  . Carpal tunnel  release right  2006  . Colonoscopy    . Colonoscopy  01/25/2011    Dr. Rehman:Examination performed to cecum Pan colonic diverticulosis/2 small polyps ablated via cold biopsy from transverse colon/External hemorrhoids, benign polyps  . Colonoscopy N/A 06/29/2013    Procedure: COLONOSCOPY;  Surgeon: Daneil Dolin, MD;  Location: [facility];  Service: Endoscopy;  Laterality: N/A;  10:45     Social History:  The patient  reports that he has never smoked. His smokeless tobacco use includes Chew. He reports that he drinks alcohol. He reports that he does not use illicit drugs.   Family History:  The patient's family history includes Aneurysm in his father; Colon cancer in his mother; Stroke in his brother.    ROS:  Please see the history of present illness. All  other systems are reviewed and  Negative to the above problem except as noted.    PHYSICAL EXAM: VS:  BP 126/70 mmHg  Pulse 60  Ht 5\' 10"  (1.778 m)  Wt 231 lb (104.781 kg)  BMI 33.15 kg/m2  SpO2 92%  GEN: Well nourished, well developed, in no acute distress HEENT: normal Neck: no JVD, carotid bruits, or masses Cardiac: RRR; Gr II/VI Sysltolic murmur LSB to base  , rubs, or gallops,no edema  Respiratory:  clear to auscultation bilaterally, normal work of breathing GI: soft, nontender, nondistended, + BS  No hepatomegaly  MS: no deformity Moving all extremities   Skin: warm and dry, no rash Neuro:  Strength and sensation are intact Psych: euthymic mood, full affect   EKG:  EKG is not ordered today.   Lipid Panel    Component Value Date/Time   CHOL 122* 06/16/2015 0933   TRIG 65 06/16/2015 0933    HDL 52 06/16/2015 0933   CHOLHDL 2.3 06/16/2015 0933   VLDL 13 06/16/2015 0933   LDLCALC 57 06/16/2015 0933      Wt Readings from Last 3 Encounters:  06/20/15 231 lb (104.781 kg)  06/16/15 230 lb 6.4 oz (104.509 kg)  04/07/15 233 lb 6.4 oz (105.87 kg)      ASSESSMENT AND PLAN:  1  Bradycardia  Pt has had a long history of bradycardia per his report  Would recomm a 24 hour holter to evaluate CHeck TSH  2.  Murmur  ? Aortic scleroisis   WIll set up for echo  3.  HL  Excellent control    F/U will be based on test results       Signed, Dorris Carnes, MD  06/20/2015 8:35 AM    [address], [place], [place]  [address] Phone: [phone]; Fax: [phone]

## 2015-06-20 NOTE — Patient Instructions (Signed)
Medication Instructions:  Your physician recommends that you continue on your current medications as directed. Please refer to the Current Medication list given to you today.   Labwork: Your physician recommends that you return for lab work [id number]  TSH   Testing/Procedures: Your physician has recommended that you wear a holter monitor. Holter monitors are medical devices that record the heart's electrical activity. Doctors most often use these monitors to diagnose arrhythmias. Arrhythmias are problems with the speed or rhythm of the heartbeat. The monitor is a small, portable device. You can wear one while you do your normal daily activities. This is usually used to diagnose what is causing palpitations/syncope (passing out).  Your physician has requested that you have an echocardiogram. Echocardiography is a painless test that uses sound waves to create images of your heart. It provides your doctor with information about the size and shape of your heart and how well your heart's chambers and valves are working. This procedure takes approximately one hour. There are no restrictions for this procedure.    Follow-Up: Your physician recommends that you schedule a follow-up appointment in: TO BE DETERMINED   Any Other Special Instructions Will Be Listed Below (If Applicable).     If you need a refill on your cardiac medications before your next appointment, please call your pharmacy.

## 2015-06-23 ENCOUNTER — Ambulatory Visit (HOSPITAL_BASED_OUTPATIENT_CLINIC_OR_DEPARTMENT_OTHER)
Admission: RE | Admit: 2015-06-23 | Discharge: 2015-06-23 | Disposition: A | Discharge status: Home / Self Care | Payer: Medicare Other | Source: Ambulatory Visit | Attending: Internal Medicine | Admitting: Internal Medicine

## 2015-06-23 ENCOUNTER — Ambulatory Visit (HOSPITAL_COMMUNITY)
Admission: RE | Admit: 2015-06-23 | Discharge: 2015-06-23 | Disposition: A | Discharge status: Home / Self Care | Payer: Medicare Other | Source: Ambulatory Visit | Attending: Internal Medicine | Admitting: Internal Medicine

## 2015-06-23 DIAGNOSIS — E669 Obesity, unspecified: Secondary | ICD-10-CM | POA: Insufficient documentation

## 2015-06-23 DIAGNOSIS — I10 Essential (primary) hypertension: Secondary | ICD-10-CM | POA: Diagnosis not present

## 2015-06-23 DIAGNOSIS — R011 Cardiac murmur, unspecified: Secondary | ICD-10-CM | POA: Diagnosis not present

## 2015-06-23 DIAGNOSIS — R001 Bradycardia, unspecified: Secondary | ICD-10-CM

## 2015-07-23 ENCOUNTER — Other Ambulatory Visit: Payer: Self-pay | Admitting: Family Medicine

## 2015-08-26 ENCOUNTER — Ambulatory Visit (INDEPENDENT_AMBULATORY_CARE_PROVIDER_SITE_OTHER): Payer: Medicare Other | Admitting: Family Medicine

## 2015-08-26 ENCOUNTER — Encounter: Payer: Self-pay | Admitting: Family Medicine

## 2015-08-26 VITALS — BP 126/72 | HR 46 | Resp 18 | Ht 72.0 in | Wt 229.0 lb

## 2015-08-26 DIAGNOSIS — E669 Obesity, unspecified: Secondary | ICD-10-CM

## 2015-08-26 DIAGNOSIS — E785 Hyperlipidemia, unspecified: Secondary | ICD-10-CM

## 2015-08-26 DIAGNOSIS — E66811 Obesity, class 1: Secondary | ICD-10-CM

## 2015-08-26 DIAGNOSIS — F419 Anxiety disorder, unspecified: Secondary | ICD-10-CM | POA: Diagnosis not present

## 2015-08-26 DIAGNOSIS — R001 Bradycardia, unspecified: Secondary | ICD-10-CM | POA: Diagnosis not present

## 2015-08-26 DIAGNOSIS — F418 Other specified anxiety disorders: Secondary | ICD-10-CM

## 2015-08-26 DIAGNOSIS — F5105 Insomnia due to other mental disorder: Secondary | ICD-10-CM

## 2015-08-26 DIAGNOSIS — G5602 Carpal tunnel syndrome, left upper limb: Secondary | ICD-10-CM

## 2015-08-26 DIAGNOSIS — H9193 Unspecified hearing loss, bilateral: Secondary | ICD-10-CM

## 2015-08-26 MED ORDER — ATORVASTATIN CALCIUM 40 MG PO TABS: 40.0000 mg | ORAL_TABLET | Freq: Every day | ORAL | Status: DC

## 2015-08-26 NOTE — Progress Notes (Signed)
Subjective:    Patient ID: Terry Love, male    DOB: Dec 27, 1935, 80 y.o.   MRN: [id number]  HPI   Terry Love     MRN: [id number]      DOB: 18-Nov-1935   HPI Terry Love is here for follow up and re-evaluation of chronic medical conditions, medication management and review of any available recent lab and radiology data.  Preventive health is updated, specifically  Cancer screening and Immunization.   Questions or concerns regarding consultations or procedures which the PT has had in the interim are  Addressed.Has been cleared by cardiology, and is now having both his mental health and hearing loss needs addressed through the [place] The PT denies any adverse reactions to current medications since the last visit.  C/o increased pain , numbness and tingling in hand, awakens him at night , shakes for relief, grip noted to be weak ROS Denies recent fever or chills. Denies sinus pressure, nasal congestion, ear pain or sore throat. Denies chest congestion, productive cough or wheezing. Denies chest pains, palpitations and leg swelling Denies abdominal pain, nausea, vomiting,diarrhea or constipation.   Denies dysuria, frequency, hesitancy or incontinence. Denies headaches, seizures, numbness, or tingling. Denies uncontrolled depression, anxiety or insomnia. Denies skin break down or rash.   PE  BP 126/72 mmHg  Pulse 46  Resp 18  Ht 6' (1.829 m)  Wt 229 lb (103.874 kg)  BMI 31.05 kg/m2  SpO2 98%  Patient alert and oriented and in no cardiopulmonary distress.  HEENT: No facial asymmetry, EOMI,   oropharynx pink and moist.  Neck supple no JVD, no mass.  Chest: Clear to auscultation bilaterally.  CVS: S1, S2 no murmurs, no S3.Regular rate.  ABD: Soft non tender.   Ext: No edema  MS: Adequate ROM spine, shoulders, hips and knees. Hands: wasting of left thenar eminence, weak gripin left hand and positive tinnel's sign Skin: Intact, no ulcerations or rash noted.  Psych: Good  eye contact, normal affect. Memory intact not anxious or depressed appearing.  CNS: CN 2-12 intact, Marked hearing loss however power,  normal throughout.no focal deficits noted.   Assessment & Plan Sinus bradycardia, chronic Unchaged, recently completed full cardiology evaluation, no changes at this time, had holter monitor and echo  Depression with anxiety Improved, and will start therapy through the [place], I anticipate that he will also see psychiatrist, I explained that any changes to mental health meds by psych will be fine with me  Insomnia secondary to anxiety Sleep hygiene reviewed and written information offered also. Prescription sent for  medication needed.   Hyperlipidemia LDL goal <100 Hyperlipidemia:Low fat diet discussed and encouraged.   Lipid Panel  Lab Results  Component Value Date   CHOL 122* 06/16/2015   HDL 52 06/16/2015   LDLCALC 57 06/16/2015   TRIG 65 06/16/2015   CHOLHDL 2.3 06/16/2015   Controlled, no change in medication      Obesity (BMI 30.0-34.9) Unchnaged Patient re-educated about  the importance of commitment to a  minimum of 150 minutes of exercise per week.  The importance of healthy food choices with portion control discussed. Encouraged to start a food diary, count calories and to consider  joining a support group. Sample diet sheets offered. Goals set by the patient for the next several months.   Weight /BMI 08/26/2015 06/20/2015 06/16/2015  WEIGHT 229 lb 231 lb 230 lb 6.4 oz  HEIGHT 6\' 0"  5\' 10"  6\' 0"   BMI 31.05 kg/m2 33.15 kg/m2  31.24 kg/m2    Current exercise per week 90 minutes.   Hearing loss Continued deterioration in hearing. [facility] This is a major contributor to his depression          Review of Systems     Objective:   Physical Exam        Assessment & Plan:

## 2015-08-26 NOTE — Patient Instructions (Signed)
Annual physical exam in 4.5 month, call if you need me sooner  Thankful that heart evaluation is excellent  Thankful [facility] are [facility].  Continue current medications  You are referred to hand surgeon in [place], re left carpal tunnel syndrrome, please have your wife call back if a specific Doc is requested , no later than Monday

## 2015-08-27 DIAGNOSIS — G5600 Carpal tunnel syndrome, unspecified upper limb: Secondary | ICD-10-CM | POA: Insufficient documentation

## 2015-08-27 NOTE — Assessment & Plan Note (Signed)
Hyperlipidemia:Low fat diet discussed and encouraged.   Lipid Panel  Lab Results  Component Value Date   CHOL 122* 06/16/2015   HDL 52 06/16/2015   LDLCALC 57 06/16/2015   TRIG 65 06/16/2015   CHOLHDL 2.3 06/16/2015   Controlled, no change in medication

## 2015-08-27 NOTE — Assessment & Plan Note (Signed)
Increased pain , and weakness of left hand, desires ortho eval, will refer

## 2015-08-27 NOTE — Assessment & Plan Note (Signed)
Sleep hygiene reviewed and written information offered also. Prescription sent for  medication needed.  

## 2015-08-27 NOTE — Assessment & Plan Note (Signed)
Continued deterioration in hearing. [facility] This is a major contributor to his depression

## 2015-08-27 NOTE — Assessment & Plan Note (Addendum)
Improved, and will start therapy through the [place], I anticipate that he will also see psychiatrist, I explained that any changes to mental health meds by psych will be fine with me

## 2015-08-27 NOTE — Assessment & Plan Note (Signed)
Unchnaged Patient re-educated about  the importance of commitment to a  minimum of 150 minutes of exercise per week.  The importance of healthy food choices with portion control discussed. Encouraged to start a food diary, count calories and to consider  joining a support group. Sample diet sheets offered. Goals set by the patient for the next several months.   Weight /BMI 08/26/2015 06/20/2015 06/16/2015  WEIGHT 229 lb 231 lb 230 lb 6.4 oz  HEIGHT 6\' 0"  5\' 10"  6\' 0"   BMI 31.05 kg/m2 33.15 kg/m2 31.24 kg/m2    Current exercise per week 90 minutes.

## 2015-08-27 NOTE — Assessment & Plan Note (Signed)
Unchaged, recently completed full cardiology evaluation, no changes at this time, had holter monitor and echo

## 2015-09-06 DIAGNOSIS — G5602 Carpal tunnel syndrome, left upper limb: Secondary | ICD-10-CM | POA: Diagnosis not present

## 2015-09-21 DIAGNOSIS — G5602 Carpal tunnel syndrome, left upper limb: Secondary | ICD-10-CM | POA: Diagnosis not present

## 2015-09-26 DIAGNOSIS — G5602 Carpal tunnel syndrome, left upper limb: Secondary | ICD-10-CM | POA: Insufficient documentation

## 2015-10-15 ENCOUNTER — Other Ambulatory Visit: Payer: Self-pay | Admitting: Family Medicine

## 2015-10-31 ENCOUNTER — Other Ambulatory Visit: Payer: Self-pay | Admitting: Family Medicine

## 2015-11-01 ENCOUNTER — Other Ambulatory Visit: Payer: Self-pay | Admitting: Family Medicine

## 2015-11-01 DIAGNOSIS — M25632 Stiffness of left wrist, not elsewhere classified: Secondary | ICD-10-CM | POA: Diagnosis not present

## 2015-11-03 ENCOUNTER — Other Ambulatory Visit: Payer: Self-pay | Admitting: Family Medicine

## 2015-11-11 DIAGNOSIS — G5602 Carpal tunnel syndrome, left upper limb: Secondary | ICD-10-CM | POA: Diagnosis not present

## 2015-11-15 ENCOUNTER — Other Ambulatory Visit: Payer: Self-pay | Admitting: Family Medicine

## 2015-12-14 ENCOUNTER — Ambulatory Visit (INDEPENDENT_AMBULATORY_CARE_PROVIDER_SITE_OTHER): Payer: Medicare Other

## 2015-12-14 DIAGNOSIS — Z23 Encounter for immunization: Secondary | ICD-10-CM | POA: Diagnosis not present

## 2016-01-19 DIAGNOSIS — G5602 Carpal tunnel syndrome, left upper limb: Secondary | ICD-10-CM | POA: Diagnosis not present

## 2016-01-19 DIAGNOSIS — Z9889 Other specified postprocedural states: Secondary | ICD-10-CM | POA: Diagnosis not present

## 2016-01-27 ENCOUNTER — Other Ambulatory Visit: Payer: Self-pay | Admitting: Family Medicine

## 2016-02-06 ENCOUNTER — Other Ambulatory Visit: Payer: Self-pay | Admitting: Family Medicine

## 2016-02-20 ENCOUNTER — Encounter: Payer: Self-pay | Admitting: Family Medicine

## 2016-02-20 ENCOUNTER — Other Ambulatory Visit: Payer: Self-pay

## 2016-02-20 ENCOUNTER — Ambulatory Visit (INDEPENDENT_AMBULATORY_CARE_PROVIDER_SITE_OTHER): Payer: Medicare Other | Admitting: Family Medicine

## 2016-02-20 VITALS — BP 118/64 | HR 51 | Resp 16 | Ht 72.0 in | Wt 227.0 lb

## 2016-02-20 DIAGNOSIS — E785 Hyperlipidemia, unspecified: Secondary | ICD-10-CM

## 2016-02-20 DIAGNOSIS — E669 Obesity, unspecified: Secondary | ICD-10-CM

## 2016-02-20 DIAGNOSIS — R0683 Snoring: Secondary | ICD-10-CM | POA: Diagnosis not present

## 2016-02-20 DIAGNOSIS — F418 Other specified anxiety disorders: Secondary | ICD-10-CM | POA: Diagnosis not present

## 2016-02-20 DIAGNOSIS — Z0000 Encounter for general adult medical examination without abnormal findings: Secondary | ICD-10-CM

## 2016-02-20 DIAGNOSIS — R001 Bradycardia, unspecified: Secondary | ICD-10-CM | POA: Diagnosis not present

## 2016-02-20 DIAGNOSIS — R9431 Abnormal electrocardiogram [ECG] [EKG]: Secondary | ICD-10-CM

## 2016-02-20 DIAGNOSIS — R6889 Other general symptoms and signs: Secondary | ICD-10-CM

## 2016-02-20 MED ORDER — TEMAZEPAM 15 MG PO CAPS: 15.0000 mg | ORAL_CAPSULE | Freq: Every evening | ORAL | 2 refills | Status: DC | PRN

## 2016-02-20 MED ORDER — CITALOPRAM HYDROBROMIDE 40 MG PO TABS: 40.0000 mg | ORAL_TABLET | Freq: Every day | ORAL | 3 refills | Status: DC

## 2016-02-20 NOTE — Patient Instructions (Addendum)
F/u end February, call if you need me sooner  You are referred to cardiology based on increased exertional fatigue and slo heart rate  You are referred to Dr Merlene Laughter to be tested for sleep apnea  You need to schedule appt with mental health through the [place], if unable , call , we have mental health hre in [place] that will be ebnficial  TWO 20 mg tablets tilkl done and start sleepy time tea, on cup at bedtime Stop trazodone, new for sleep is restoril  THANKFUL, hearing aids are helping  Fasting lipid, cmp and CBC  This week please  Sorry about confusion with appt time  Thank you  for choosing [facility]. We consider it a privelige to serve you.  Delivering excellent health care in a caring and  compassionate way is our goal.  Partnering with you,  so that together we can achieve this goal is our strategy.

## 2016-02-20 NOTE — Assessment & Plan Note (Signed)
Pt with chronic fatigue and sleep disordered breathing, needs eval for sleep apnea, agrees to be seen

## 2016-02-20 NOTE — Assessment & Plan Note (Signed)
New exertional fatigue x 1 month, cardiology to re eval Office [id number] brady, rate of 45 with inverted t in lateral leads, seen in 06/2015 also

## 2016-02-20 NOTE — Progress Notes (Signed)
   Terry Love     MRN: [id number]      DOB: 1936/02/20

## 2016-02-20 NOTE — Assessment & Plan Note (Signed)
Poor sleep, discontinue trazodone , starts restoril Increase celexa to 40 mg . Needs therapy and psychiatric management , patient and  Spouse are aware and agree to follow through

## 2016-02-20 NOTE — Progress Notes (Signed)
   Terry Love     MRN: [id number]      DOB: December 02, 1935   HPI: Patient is in for annual physical exam.  1 month h/o excessive fatigue with poor exercise tolerance, also noted to be sweating excessively by spouse, has long h/o asymptomatic bradycardia , needs cardiology re evaluation C/o fatigue and snoring, agrees to sleep study, which he needs Feels depressed, no active suicidal or homicidal plan, poor sleep, recommend again mental health through the [place] if possible and medications adjusted with 2 month follow up   Immunization is reviewed , and  updated if needed.    PE;  BP 118/64   Pulse (!) 51   Resp 16   Ht 6' (1.829 m)   Wt 227 lb (103 kg)   SpO2 96%   BMI 30.79 kg/m   Pleasant male, alert and oriented x 3, in no cardio-pulmonary distress. Afebrile. HEENT No facial trauma or asymetry. Sinuses non tender. EOMI, External ears normal, tympanic membranes clear. Oropharynx moist, no exudate. Neck: supple, no adenopathy,JVD or thyromegaly.No bruits.  Chest: Clear to ascultation bilaterally.No crackles or wheezes. Non tender to palpation  Breast: No asymetry,no masses. No nipple discharge or inversion. No axillary or supraclavicular adenopathy  Cardiovascular system; Heart sounds normal,  S1 and  S2 ,no S3.  No murmur, or thrill. Apical beat not displaced Peripheral pulses normal.  Abdomen: Soft, non tender, no organomegaly or masses. No bruits. Bowel sounds normal. No guarding, tenderness or rebound.  Rectal:  Normal sphincter tone. No hemorrhoids or  masses. guaiac negative stool.   Musculoskeletal exam: Full ROM of spine, hips , shoulders and knees. No deformity ,swelling or crepitus noted. No muscle wasting or atrophy.   Neurologic: Cranial nerves 2 to 12 intact. Power, tone ,sensation and reflexes normal throughout. No disturbance in gait. No tremor.  Skin: Intact, no ulceration, erythema , scaling or rash noted. Pigmentation normal  throughout  Psych; Normal mood and flat  affect. Judgement and concentration normal   Assessment & Plan:  Annual physical exam Annual exam as documented. Counseling done  re healthy lifestyle involving commitment to 150 minutes exercise per week, heart healthy diet, and attaining healthy weight.The importance of adequate sleep also discussed. Regular seat belt use and home safety, is also discussed. Changes in health habits are decided on by the patient with goals and time frames  set for achieving them. Immunization and cancer screening needs are specifically addressed at this visit.   Sinus bradycardia, chronic New exertional fatigue x 1 month, cardiology to re eval Office [id number] brady, rate of 45 with inverted t in lateral leads, seen in 06/2015 also  Snoring Pt with chronic fatigue and sleep disordered breathing, needs eval for sleep apnea, agrees to be seen  Depression with anxiety Poor sleep, discontinue trazodone , starts restoril Increase celexa to 40 mg . Needs therapy and psychiatric management , patient and  Spouse are aware and agree to follow through

## 2016-02-20 NOTE — Assessment & Plan Note (Signed)

## 2016-02-21 DIAGNOSIS — E669 Obesity, unspecified: Secondary | ICD-10-CM | POA: Diagnosis not present

## 2016-02-21 DIAGNOSIS — E785 Hyperlipidemia, unspecified: Secondary | ICD-10-CM | POA: Diagnosis not present

## 2016-02-21 LAB — CBC
HCT: 40.4 % (ref 38.5–50.0)
HEMOGLOBIN: 13.1 g/dL — AB (ref 13.2–17.1)
MCH: 29.7 pg (ref 27.0–33.0)
MCHC: 32.4 g/dL (ref 32.0–36.0)
MCV: 91.6 fL (ref 80.0–100.0)
MPV: 9.6 fL (ref 7.5–12.5)
PLATELETS: 315 10*3/uL (ref 140–400)
RBC: 4.41 MIL/uL (ref 4.20–5.80)
RDW: 12.5 % (ref 11.0–15.0)
WBC: 4.3 10*3/uL (ref 3.8–10.8)

## 2016-02-21 LAB — COMPREHENSIVE METABOLIC PANEL
ALBUMIN: 4.1 g/dL (ref 3.6–5.1)
ALK PHOS: 46 U/L (ref 40–115)
ALT: 18 U/L (ref 9–46)
AST: 28 U/L (ref 10–35)
BILIRUBIN TOTAL: 0.7 mg/dL (ref 0.2–1.2)
BUN: 12 mg/dL (ref 7–25)
CALCIUM: 9.3 mg/dL (ref 8.6–10.3)
CO2: 30 mmol/L (ref 20–31)
Chloride: 105 mmol/L (ref 98–110)
Creat: 0.73 mg/dL (ref 0.70–1.18)
Glucose, Bld: 101 mg/dL — ABNORMAL HIGH (ref 65–99)
POTASSIUM: 4.2 mmol/L (ref 3.5–5.3)
Sodium: 139 mmol/L (ref 135–146)
TOTAL PROTEIN: 6.9 g/dL (ref 6.1–8.1)

## 2016-02-21 LAB — LIPID PANEL
CHOLESTEROL: 132 mg/dL (ref <200)
HDL: 53 mg/dL (ref >40)
LDL CALC: 68 mg/dL (ref <100)
TRIGLYCERIDES: 54 mg/dL (ref <150)
Total CHOL/HDL Ratio: 2.5 Ratio (ref <5.0)
VLDL: 11 mg/dL (ref <30)

## 2016-03-07 DIAGNOSIS — F331 Major depressive disorder, recurrent, moderate: Secondary | ICD-10-CM | POA: Diagnosis not present

## 2016-03-07 DIAGNOSIS — E785 Hyperlipidemia, unspecified: Secondary | ICD-10-CM | POA: Diagnosis not present

## 2016-03-07 DIAGNOSIS — R001 Bradycardia, unspecified: Secondary | ICD-10-CM | POA: Diagnosis not present

## 2016-03-07 DIAGNOSIS — F5105 Insomnia due to other mental disorder: Secondary | ICD-10-CM | POA: Diagnosis not present

## 2016-03-07 DIAGNOSIS — R252 Cramp and spasm: Secondary | ICD-10-CM | POA: Diagnosis not present

## 2016-03-07 DIAGNOSIS — G4733 Obstructive sleep apnea (adult) (pediatric): Secondary | ICD-10-CM | POA: Diagnosis not present

## 2016-03-07 DIAGNOSIS — R5383 Other fatigue: Secondary | ICD-10-CM | POA: Diagnosis not present

## 2016-03-07 DIAGNOSIS — H906 Mixed conductive and sensorineural hearing loss, bilateral: Secondary | ICD-10-CM | POA: Diagnosis not present

## 2016-03-21 ENCOUNTER — Other Ambulatory Visit (HOSPITAL_BASED_OUTPATIENT_CLINIC_OR_DEPARTMENT_OTHER): Payer: Self-pay

## 2016-03-21 DIAGNOSIS — G4733 Obstructive sleep apnea (adult) (pediatric): Secondary | ICD-10-CM

## 2016-03-22 DIAGNOSIS — G5602 Carpal tunnel syndrome, left upper limb: Secondary | ICD-10-CM | POA: Diagnosis not present

## 2016-03-23 ENCOUNTER — Ambulatory Visit: Payer: Medicare Other | Attending: Neurology | Admitting: Neurology

## 2016-03-23 ENCOUNTER — Ambulatory Visit (INDEPENDENT_AMBULATORY_CARE_PROVIDER_SITE_OTHER): Payer: Medicare Other | Admitting: Cardiovascular Disease

## 2016-03-23 ENCOUNTER — Encounter: Payer: Self-pay | Admitting: Cardiovascular Disease

## 2016-03-23 VITALS — BP 104/64 | HR 53 | Ht 70.0 in | Wt 223.0 lb

## 2016-03-23 DIAGNOSIS — Z7982 Long term (current) use of aspirin: Secondary | ICD-10-CM | POA: Insufficient documentation

## 2016-03-23 DIAGNOSIS — R0683 Snoring: Secondary | ICD-10-CM | POA: Diagnosis not present

## 2016-03-23 DIAGNOSIS — G4733 Obstructive sleep apnea (adult) (pediatric): Secondary | ICD-10-CM | POA: Diagnosis not present

## 2016-03-23 DIAGNOSIS — E7800 Pure hypercholesterolemia, unspecified: Secondary | ICD-10-CM | POA: Diagnosis not present

## 2016-03-23 DIAGNOSIS — R001 Bradycardia, unspecified: Secondary | ICD-10-CM

## 2016-03-23 DIAGNOSIS — Z79899 Other long term (current) drug therapy: Secondary | ICD-10-CM | POA: Diagnosis not present

## 2016-03-23 DIAGNOSIS — R5383 Other fatigue: Secondary | ICD-10-CM

## 2016-03-23 DIAGNOSIS — R9431 Abnormal electrocardiogram [ECG] [EKG]: Secondary | ICD-10-CM | POA: Diagnosis not present

## 2016-03-23 DIAGNOSIS — G4761 Periodic limb movement disorder: Secondary | ICD-10-CM | POA: Diagnosis not present

## 2016-03-23 NOTE — Progress Notes (Signed)
CARDIOLOGY CONSULT NOTE  Patient ID: LEVERE FAHRER MRN: [id number] DOB/AGE: 10/08/1935 80 y.o.  Admit date: (Not on file) Primary Physician: Tula Nakayama, MD Referring Physician:   Reason for Consultation: bradycardia  HPI: The patient is a 80 year old male with a history of hyperlipidemia who presents for evaluation of fatigue, bradycardia, and poor exercise tolerance. Heart rate at PCPs office 02/20/16-51 bpm. A sleep study has been ordered due to history of snoring.  Holter monitor 06/27/15 average heart rate 57 bpm with no significant pauses.  Echocardiogram 06/23/15: Normal left ventricular systolic function, EF [id number], mild LVH, grade 1 diastolic dysfunction, mild to moderate left atrial enlargement, aortic valve sclerosis without stenosis, mild aortic regurgitation.  ECG 02/20/16: Sinus bradycardia, heart rate 45 bpm, first-degree AV block, PR interval 236 ms, diffuse T-wave abnormality in precordial leads.  TSH normal 06/20/15.  He has been feeling fatigued with mild exertional dyspnea for the past 3 months. His wife has noticed him to be diaphoretic just while sitting at times. He denies exertional chest pain, orthopnea, and leg swelling. He denies syncope.   No Known Allergies  Current Outpatient Prescriptions  Medication Sig Dispense Refill  . aspirin (BAYER ASPIRIN EC LOW DOSE) 81 MG EC tablet Take 81 mg by mouth daily.      Marland Kitchen atorvastatin (LIPITOR) 40 MG tablet Take 1 tablet (40 mg total) by mouth at bedtime. 90 tablet 1  . calcium-vitamin D (OSCAL WITH D) 500-200 MG-UNIT per tablet Take 1 tablet by mouth 3 (three) times daily.     . citalopram (CELEXA) 40 MG tablet Take 1 tablet (40 mg total) by mouth daily. 30 tablet 3  . montelukast (SINGULAIR) 10 MG tablet TAKE 1 TABLET BY MOUTH AT BEDTIME 30 tablet 3  . Multiple Vitamin (MULTIVITAMIN) tablet Take 1 tablet by mouth daily. Unspecified frequency    . temazepam (RESTORIL) 15 MG capsule Take 1 capsule  (15 mg total) by mouth at bedtime as needed for sleep. 30 capsule 2  . Vitamin D, Ergocalciferol, (DRISDOL) 50000 units CAPS capsule TAKE ONE CAPSULE BY MOUTH A WEEK 12 capsule 1  . Wheat Dextrin (BENEFIBER DRINK MIX PO) Take by mouth. Use as directed daily     No current facility-administered medications for this visit.     Past Medical History:  Diagnosis Date  . Anxiety   . Bradycardia    Chronic   . Colon polyps   . Depression   . Facial tic   . Hearing loss   . Hyperlipidemia   . 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 Obesity   . Seasonal allergies     Past Surgical History:  Procedure Laterality Date  . carpal tunnel  release right  2006  . COLONOSCOPY    . COLONOSCOPY  01/25/2011   Dr. Rehman:Examination performed to cecum Pan colonic diverticulosis/2 small polyps ablated via cold biopsy from transverse colon/External hemorrhoids, benign polyps  . COLONOSCOPY N/A 06/29/2013   Procedure: COLONOSCOPY;  Surgeon: Daneil Dolin, MD;  [facility];  Service: Endoscopy;  Laterality: N/A;  10:45    Social History   Social History  . Marital status: Married    Spouse name: N/A  . Number of children: N/A  . Years of education: N/A   Occupational History  . Not on file.   Social History Main Topics  . Smoking status: Never Smoker  . Smokeless tobacco: Current User    Types: Chew  .  Alcohol use 0.0 oz/week     Comment: occasional  . Drug use: No  . Sexual activity: Not on file   Other Topics Concern  . Not on file   Social History Narrative  . No narrative on file     No family history of premature CAD in 1st degree relatives.  Prior to Admission medications   Medication Sig Start Date End Date Taking? Authorizing Provider  aspirin (BAYER ASPIRIN EC LOW DOSE) 81 MG EC tablet Take 81 mg by mouth daily.     Yes Historical Provider, MD  atorvastatin (LIPITOR) 40 MG tablet Take 1 tablet (40 mg total) by mouth at bedtime. 08/26/15   Yes Fayrene Helper, MD  calcium-vitamin D (OSCAL WITH D) 500-200 MG-UNIT per tablet Take 1 tablet by mouth 3 (three) times daily.    Yes Historical Provider, MD  citalopram (CELEXA) 40 MG tablet Take 1 tablet (40 mg total) by mouth daily. 02/20/16  Yes Fayrene Helper, MD  montelukast (SINGULAIR) 10 MG tablet TAKE 1 TABLET BY MOUTH AT BEDTIME 02/06/16  Yes Fayrene Helper, MD  Multiple Vitamin (MULTIVITAMIN) tablet Take 1 tablet by mouth daily. Unspecified frequency   Yes Historical Provider, MD  temazepam (RESTORIL) 15 MG capsule Take 1 capsule (15 mg total) by mouth at bedtime as needed for sleep. 02/20/16  Yes Fayrene Helper, MD  Vitamin D, Ergocalciferol, (DRISDOL) 50000 units CAPS capsule TAKE ONE CAPSULE BY MOUTH A WEEK 11/05/15  Yes Fayrene Helper, MD  Wheat Dextrin (BENEFIBER DRINK MIX PO) Take by mouth. Use as directed daily   Yes Historical Provider, MD     Review of systems complete and found to be negative unless listed above in HPI     Physical exam Pulse (!) 53, height 5\' 10"  (1.778 m), weight 223 lb (101.2 kg), SpO2 96 %. General: NAD Neck: No JVD, no thyromegaly or thyroid nodule.  Lungs: Clear to auscultation bilaterally with normal respiratory effort. CV: Nondisplaced PMI. Bradycardic, regular rhythm, normal S1/S2, no S3/S4, no murmur.  No peripheral edema.  No carotid bruit.    Abdomen: Soft, nontender, obese.  Skin: Intact without lesions or rashes.  Neurologic: Alert and oriented x 3.  Psych: Normal affect. Extremities: No clubbing or cyanosis.  HEENT: Normal.   ECG: Most recent ECG reviewed.  Labs:   Lab Results  Component Value Date   WBC 4.3 02/20/2016   HGB 13.1 (L) 02/20/2016   HCT 40.4 02/20/2016   MCV 91.6 02/20/2016   PLT 315 02/20/2016   No results for input(s): NA, K, CL, CO2, BUN, CREATININE, CALCIUM, PROT, BILITOT, ALKPHOS, ALT, AST, GLUCOSE in the last 168 hours.  Invalid input(s): LABALBU No results found for: CKTOTAL,  CKMB, CKMBINDEX, TROPONINI  Lab Results  Component Value Date   CHOL 132 02/20/2016   CHOL 122 (L) 06/16/2015   CHOL 134 10/28/2014   Lab Results  Component Value Date   HDL 53 02/20/2016   HDL 52 06/16/2015   HDL 50 10/28/2014   Lab Results  Component Value Date   LDLCALC 68 02/20/2016   LDLCALC 57 06/16/2015   LDLCALC 74 10/28/2014   Lab Results  Component Value Date   TRIG 54 02/20/2016   TRIG 65 06/16/2015   TRIG 52 10/28/2014   Lab Results  Component Value Date   CHOLHDL 2.5 02/20/2016   CHOLHDL 2.3 06/16/2015   CHOLHDL 2.7 10/28/2014   No results found for: LDLDIRECT  Studies: No results found.  ASSESSMENT AND PLAN:  1. Bradycardia/fatigue/abnormal ECG: TSH normal 06/20/15. Not on AV nodal blocking agents.  I will proceed with a nuclear myocardial perfusion imaging study ([place]) to evaluate for ischemic heart disease. I will also obtain a 3 week event monitor to see if average HR has declined and to see if he has symptom correlation (i.e symptomatic bradycardia). Scheduled for sleep study tonight (sleep apnea is a common cause of bradycardia and fatigue). On ASA and statin.  2. Hyperlipidemia: Lipids 02/20/16 total cholesterol 132, trig glycerides 54, HDL 53, LDL 68. Continue statin therapy.  Dispo: fu 6 weeks    Signed: Kate Sable, M.D., F.A.C.C.  03/23/2016, 9:12 AM

## 2016-03-23 NOTE — Patient Instructions (Signed)
Your physician recommends that you schedule a follow-up appointment in: 6 weeks Dr Bronson Ing  Your physician recommends that you continue on your current medications as directed. Please refer to the Current Medication list given to you today.   Your physician has recommended that you wear an event monitor for 21 days. Event monitors are medical devices that record the heart's electrical activity. Doctors most often [place] these monitors to diagnose arrhythmias. Arrhythmias are problems with the speed or rhythm of the heartbeat. The monitor is a small, portable device. You can wear one while you do your normal daily activities. This is usually used to diagnose what is causing palpitations/syncope (passing out).    Your physician has requested that you have a lexiscan myoview. For further information please visit [email or web address]. Please follow instruction sheet, as given.    Thank you for choosing [facility] !

## 2016-03-27 ENCOUNTER — Inpatient Hospital Stay (HOSPITAL_COMMUNITY): Admission: RE | Admit: 2016-03-27 | Payer: Medicare Other | Source: Ambulatory Visit

## 2016-03-27 ENCOUNTER — Encounter (HOSPITAL_COMMUNITY): Payer: Self-pay

## 2016-03-27 ENCOUNTER — Encounter (HOSPITAL_COMMUNITY)
Admission: RE | Admit: 2016-03-27 | Discharge: 2016-03-27 | Disposition: A | Discharge status: Home / Self Care | Payer: Medicare Other | Source: Ambulatory Visit | Attending: Cardiovascular Disease | Admitting: Cardiovascular Disease

## 2016-03-27 DIAGNOSIS — R5383 Other fatigue: Secondary | ICD-10-CM | POA: Diagnosis not present

## 2016-03-27 DIAGNOSIS — R9431 Abnormal electrocardiogram [ECG] [EKG]: Secondary | ICD-10-CM | POA: Insufficient documentation

## 2016-03-27 LAB — NM MYOCAR MULTI W/SPECT W/WALL MOTION / EF
CHL CUP NUCLEAR SRS: 3
CHL CUP RESTING HR STRESS: 45 {beats}/min
CSEPPHR: 65 {beats}/min
LV sys vol: 29 mL
LVDIAVOL: 96 mL (ref 62–150)
NUC STRESS TID: 0.88
RATE: 0.32
SDS: 0
SSS: 3

## 2016-03-27 MED ORDER — TECHNETIUM TC 99M TETROFOSMIN IV KIT
30.0000 | PACK | Freq: Once | INTRAVENOUS | Status: AC | PRN
  Administered 2016-03-27: 31 via INTRAVENOUS

## 2016-03-27 MED ORDER — REGADENOSON 0.4 MG/5ML IV SOLN
INTRAVENOUS | Status: AC
  Administered 2016-03-27: 0.4 mg via INTRAVENOUS
  Filled 2016-03-27: qty 5

## 2016-03-27 MED ORDER — TECHNETIUM TC 99M TETROFOSMIN IV KIT
10.0000 | PACK | Freq: Once | INTRAVENOUS | Status: AC | PRN
  Administered 2016-03-27: 11.9 via INTRAVENOUS

## 2016-03-27 MED ORDER — SODIUM CHLORIDE 0.9% FLUSH
INTRAVENOUS | Status: AC
  Administered 2016-03-27: 10 mL via INTRAVENOUS
  Filled 2016-03-27: qty 10

## 2016-03-30 ENCOUNTER — Ambulatory Visit (INDEPENDENT_AMBULATORY_CARE_PROVIDER_SITE_OTHER): Payer: Medicare Other

## 2016-03-30 DIAGNOSIS — R001 Bradycardia, unspecified: Secondary | ICD-10-CM

## 2016-04-03 NOTE — Procedures (Signed)
[place] A. Merlene Laughter, MD     [email or web address]             NOCTURNAL POLYSOMNOGRAPHY   LOCATION: [facility]   Patient Name: Terry Love, Terry Love Date: 03/23/2016 Gender: Male D.O.B: 1935-10-03 Age (years): 7 Referring Provider: Barton Fanny NP Height (inches): 70 Interpreting Physician: Phillips Odor MD, ABSM Weight (lbs): 223 RPSGT: Peak, Robert BMI: 32 MRN: [id number] Neck Size: 19.50 CLINICAL INFORMATION Sleep Study Type: NPSG  Indication for sleep study: N/A  Epworth Sleepiness Score: 6  SLEEP STUDY TECHNIQUE As per the AASM Manual for the Scoring of Sleep and Associated Events v2.3 (April 2016) with a hypopnea requiring 4% desaturations.  The channels recorded and monitored were frontal, central and occipital EEG, electrooculogram (EOG), submentalis EMG (chin), nasal and oral airflow, thoracic and abdominal wall motion, anterior tibialis EMG, snore microphone, electrocardiogram, and pulse oximetry.  MEDICATIONS Medications self-administered by patient taken the night of the study : N/A  Current Outpatient Prescriptions:  .  aspirin (BAYER ASPIRIN EC LOW DOSE) 81 MG EC tablet, Take 81 mg by mouth daily.  , Disp: , Rfl:  .  atorvastatin (LIPITOR) 40 MG tablet, Take 1 tablet (40 mg total) by mouth at bedtime., Disp: 90 tablet, Rfl: 1 .  calcium-vitamin D (OSCAL WITH D) 500-200 MG-UNIT per tablet, Take 1 tablet by mouth 3 (three) times daily. , Disp: , Rfl:  .  citalopram (CELEXA) 40 MG tablet, Take 1 tablet (40 mg total) by mouth daily., Disp: 30 tablet, Rfl: 3 .  montelukast (SINGULAIR) 10 MG tablet, TAKE 1 TABLET BY MOUTH AT BEDTIME, Disp: 30 tablet, Rfl: 3 .  Multiple Vitamin (MULTIVITAMIN) tablet, Take 1 tablet by mouth daily. Unspecified frequency, Disp: , Rfl:  .  temazepam (RESTORIL) 15 MG capsule, Take 1 capsule (15 mg total) by mouth at bedtime as needed for sleep., Disp: 30 capsule, Rfl: 2 .  Vitamin D, Ergocalciferol, (DRISDOL)  50000 units CAPS capsule, TAKE ONE CAPSULE BY MOUTH A WEEK, Disp: 12 capsule, Rfl: 1 .  Wheat Dextrin (BENEFIBER DRINK MIX PO), Take by mouth. Use as directed daily, Disp: , Rfl:    SLEEP ARCHITECTURE The study was initiated at 10:09:58 PM and ended at 4:51:49 AM.  Sleep onset time was 7.8 minutes and the sleep efficiency was 91.0%. The total sleep time was 365.5 minutes.  Stage REM latency was 111.0 minutes.  The patient spent 7.93% of the night in stage N1 sleep, 80.44% in stage N2 sleep, 0.00% in stage N3 and 11.63% in REM.  Alpha intrusion was absent.  Supine sleep was 2.05%.  RESPIRATORY PARAMETERS The overall apnea/hypopnea index (AHI) was 4.4 per hour. There were 1 total apneas, including 1 obstructive, 0 central and 0 mixed apneas. There were 26 hypopneas and 32 RERAs.  The AHI during Stage REM sleep was 11.3 per hour.  AHI while supine was 16.0 per hour.  The mean oxygen saturation was 93.30%. The minimum SpO2 during sleep was 87.00%.  Loud snoring was noted during this study.  CARDIAC DATA The 2 lead EKG demonstrated sinus rhythm. The mean heart rate was 47.49 beats per minute. Other EKG findings include: None. LEG MOVEMENT DATA The total PLMS were 149 with a resulting PLMS index of 24.46. Associated arousal with leg movement index was 1.5.  IMPRESSIONS - No significant obstructive sleep apnea occurred during this study. - No significant central sleep apnea occurred during this study. - Moderate periodic limb movements of sleep occurred during the study. No significant  associated arousals. - Absent slow wave sleep is noted.  Delano Metz, MD Diplomate, American Board of Sleep Medicine.

## 2016-04-09 ENCOUNTER — Other Ambulatory Visit: Payer: Self-pay | Admitting: Family Medicine

## 2016-04-18 DIAGNOSIS — R001 Bradycardia, unspecified: Secondary | ICD-10-CM | POA: Diagnosis not present

## 2016-04-18 DIAGNOSIS — F331 Major depressive disorder, recurrent, moderate: Secondary | ICD-10-CM | POA: Diagnosis not present

## 2016-04-18 DIAGNOSIS — G4700 Insomnia, unspecified: Secondary | ICD-10-CM | POA: Diagnosis not present

## 2016-04-18 DIAGNOSIS — R5383 Other fatigue: Secondary | ICD-10-CM | POA: Diagnosis not present

## 2016-04-18 DIAGNOSIS — R252 Cramp and spasm: Secondary | ICD-10-CM | POA: Diagnosis not present

## 2016-04-18 DIAGNOSIS — H906 Mixed conductive and sensorineural hearing loss, bilateral: Secondary | ICD-10-CM | POA: Diagnosis not present

## 2016-04-18 DIAGNOSIS — E785 Hyperlipidemia, unspecified: Secondary | ICD-10-CM | POA: Diagnosis not present

## 2016-05-13 ENCOUNTER — Other Ambulatory Visit: Payer: Self-pay | Admitting: Family Medicine

## 2016-05-14 ENCOUNTER — Ambulatory Visit (INDEPENDENT_AMBULATORY_CARE_PROVIDER_SITE_OTHER): Payer: Medicare Other | Admitting: Cardiovascular Disease

## 2016-05-14 ENCOUNTER — Encounter: Payer: Self-pay | Admitting: Cardiovascular Disease

## 2016-05-14 VITALS — BP 118/62 | HR 54 | Ht 70.0 in | Wt 225.0 lb

## 2016-05-14 DIAGNOSIS — R9431 Abnormal electrocardiogram [ECG] [EKG]: Secondary | ICD-10-CM

## 2016-05-14 DIAGNOSIS — R5383 Other fatigue: Secondary | ICD-10-CM | POA: Diagnosis not present

## 2016-05-14 DIAGNOSIS — R001 Bradycardia, unspecified: Secondary | ICD-10-CM | POA: Diagnosis not present

## 2016-05-14 DIAGNOSIS — E7800 Pure hypercholesterolemia, unspecified: Secondary | ICD-10-CM

## 2016-05-14 DIAGNOSIS — R0609 Other forms of dyspnea: Secondary | ICD-10-CM

## 2016-05-14 NOTE — Patient Instructions (Signed)
Your physician recommends that you schedule a follow-up appointment in: 3 months Dr Koneswaran    Your physician recommends that you continue on your current medications as directed. Please refer to the Current Medication list given to you today..    If you need a refill on your cardiac medications before your next appointment, please call your pharmacy.    Thank you for choosing [facility] !         

## 2016-05-14 NOTE — Progress Notes (Signed)
SUBJECTIVE: The patient presents for follow-up of fatigue, bradycardia, and poor exercise tolerance.  Normal nuclear stress test 03/27/16, LVEF 70%.  Event monitoring demonstrated predominantly normal sinus rhythm with occasional sinus bradycardia. The lowest heart rate was in the 50 bpm range. There was one isolated episode of chest pain which correlated with normal sinus rhythm.  Sleep study did not demonstrate any evidence of sleep apnea.  His wife has noticed that since he retired, he gets more easily short of breath and has some diaphoresis with activities around the house. He does not walk much.   Review of Systems: As per "subjective", otherwise negative.  No Known Allergies  Current Outpatient Prescriptions  Medication Sig Dispense Refill  . aspirin (BAYER ASPIRIN EC LOW DOSE) 81 MG EC tablet Take 81 mg by mouth daily.      Marland Kitchen atorvastatin (LIPITOR) 40 MG tablet Take 1 tablet (40 mg total) by mouth at bedtime. 90 tablet 1  . calcium-vitamin D (OSCAL WITH D) 500-200 MG-UNIT per tablet Take 1 tablet by mouth 3 (three) times daily.     . citalopram (CELEXA) 40 MG tablet Take 1 tablet (40 mg total) by mouth daily. 30 tablet 3  . doxepin (SINEQUAN) 25 MG capsule Take 25 mg by mouth at bedtime.    . montelukast (SINGULAIR) 10 MG tablet TAKE 1 TABLET BY MOUTH AT BEDTIME 30 tablet 3  . Multiple Vitamin (MULTIVITAMIN) tablet Take 1 tablet by mouth daily. Unspecified frequency    . temazepam (RESTORIL) 15 MG capsule Take 1 capsule (15 mg total) by mouth at bedtime as needed for sleep. 30 capsule 2  . Vitamin D, Ergocalciferol, (DRISDOL) 50000 units CAPS capsule TAKE ONE CAPSULE BY MOUTH A WEEK 12 capsule 0  . Wheat Dextrin (BENEFIBER DRINK MIX PO) Take by mouth. Use as directed daily     No current facility-administered medications for this visit.     Past Medical History:  Diagnosis Date  . Anxiety   . Bradycardia    Chronic   . Colon polyps   . Depression   . Facial tic    . Hearing loss   . Hyperlipidemia   . 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 Obesity   . Seasonal allergies     Past Surgical History:  Procedure Laterality Date  . carpal tunnel  release right  2006  . COLONOSCOPY    . COLONOSCOPY  01/25/2011   Dr. Rehman:Examination performed to cecum Pan colonic diverticulosis/2 small polyps ablated via cold biopsy from transverse colon/External hemorrhoids, benign polyps  . COLONOSCOPY N/A 06/29/2013   Procedure: COLONOSCOPY;  Surgeon: Daneil Dolin, MD;  Location[facility];  Service: Endoscopy;  Laterality: N/A;  10:45    Social History   Social History  . Marital status: Married    Spouse name: N/A  . Number of children: N/A  . Years of education: N/A   Occupational History  . Not on file.   Social History Main Topics  . Smoking status: Never Smoker  . Smokeless tobacco: Former Systems developer    Types: [place] date: 03/13/2016  . Alcohol use 0.0 oz/week     Comment: occasional  . Drug use: No  . Sexual activity: Not on file   Other Topics Concern  . Not on file   Social History Narrative  . No narrative on file     Vitals:   05/14/16 0856  BP: 118/62  Pulse: Marland Kitchen)  54  SpO2: 95%  Weight: 225 lb (102.1 kg)  Height: 5\' 10"  (1.778 m)    PHYSICAL EXAM General: NAD Neck: No JVD, no thyromegaly or thyroid nodule.  Lungs: Clear to auscultation bilaterally with normal respiratory effort. CV: Nondisplaced PMI. Bradycardic, regular rhythm, normal S1/S2, no [id number], soft systolic murmur over RUSB.  No peripheral edema.    Abdomen: Soft, nontender, obese.  Skin: Intact without lesions or rashes.  Neurologic: Alert and oriented x 3.  Psych: Normal affect. Extremities: No clubbing or cyanosis.  HEENT: Normal.     ECG: Most recent ECG reviewed.      ASSESSMENT AND PLAN: 1. Bradycardia/exertional dyspnea/fatigue/abnormal ECG: TSH normal 06/20/15. Not on AV nodal blocking agents.  Normal stress test and  unremarkable event monitor results detailed above. Sleep study also unremarkable.  Symptoms may be due to cardiovascular deconditioning. I encouraged him to purchase a pedometer and to begin making intentional efforts at walking more. He has considered buying a FitBit which will also monitor his heart rate.  2. Hyperlipidemia: Lipids 02/20/16 total cholesterol 132, trig glycerides 54, HDL 53, LDL 68. Continue statin therapy.  Dispo: fu 3 months   Kate Sable, M.D., F.A.C.C.

## 2016-05-27 ENCOUNTER — Other Ambulatory Visit: Payer: Self-pay | Admitting: Family Medicine

## 2016-05-28 NOTE — Telephone Encounter (Signed)
Last vit d level on 3 9 17  was 44  Do you want to re order this?

## 2016-06-04 ENCOUNTER — Ambulatory Visit (INDEPENDENT_AMBULATORY_CARE_PROVIDER_SITE_OTHER): Payer: Medicare Other | Admitting: Family Medicine

## 2016-06-04 ENCOUNTER — Encounter: Payer: Self-pay | Admitting: Family Medicine

## 2016-06-04 VITALS — BP 114/64 | HR 60 | Resp 15 | Ht 70.0 in | Wt 230.0 lb

## 2016-06-04 DIAGNOSIS — E785 Hyperlipidemia, unspecified: Secondary | ICD-10-CM

## 2016-06-04 DIAGNOSIS — Z125 Encounter for screening for malignant neoplasm of prostate: Secondary | ICD-10-CM | POA: Diagnosis not present

## 2016-06-04 DIAGNOSIS — Z79899 Other long term (current) drug therapy: Secondary | ICD-10-CM | POA: Diagnosis not present

## 2016-06-04 DIAGNOSIS — F419 Anxiety disorder, unspecified: Secondary | ICD-10-CM

## 2016-06-04 DIAGNOSIS — F418 Other specified anxiety disorders: Secondary | ICD-10-CM

## 2016-06-04 DIAGNOSIS — F5105 Insomnia due to other mental disorder: Secondary | ICD-10-CM | POA: Diagnosis not present

## 2016-06-04 DIAGNOSIS — E669 Obesity, unspecified: Secondary | ICD-10-CM | POA: Diagnosis not present

## 2016-06-04 DIAGNOSIS — D649 Anemia, unspecified: Secondary | ICD-10-CM | POA: Diagnosis not present

## 2016-06-04 DIAGNOSIS — R0683 Snoring: Secondary | ICD-10-CM

## 2016-06-04 DIAGNOSIS — E559 Vitamin D deficiency, unspecified: Secondary | ICD-10-CM | POA: Diagnosis not present

## 2016-06-04 DIAGNOSIS — E66811 Obesity, class 1: Secondary | ICD-10-CM

## 2016-06-04 MED ORDER — CITALOPRAM HYDROBROMIDE 40 MG PO TABS: 40.0000 mg | ORAL_TABLET | Freq: Every day | ORAL | 3 refills | Status: DC

## 2016-06-04 MED ORDER — ATORVASTATIN CALCIUM 40 MG PO TABS: 40.0000 mg | ORAL_TABLET | Freq: Every day | ORAL | 1 refills | Status: DC

## 2016-06-04 MED ORDER — CITALOPRAM HYDROBROMIDE 40 MG PO TABS: 40.0000 mg | ORAL_TABLET | Freq: Every day | ORAL | 1 refills | Status: DC

## 2016-06-04 NOTE — Progress Notes (Signed)
Terry Love     MRN: [id number]      DOB: [birth year]   HPI Mr. Zurlo is here for follow up and re-evaluation of chronic medical conditions, medication management and review of any available recent lab and radiology data.  Preventive health is updated, specifically  Cancer screening and Immunization.   Questions or concerns regarding consultations or procedures which the PT has had in the interim are  addressed. The PT denies any adverse reactions to current medications since the last visit.  C/o poor sleep, nightmares, no regular exercise commitment, and not reading spiritual literature to calm him , prior to sleep watching the news Needs closer therapist and has not made appt with psychg tho requested this and hios spouse wanted to knowabout SSRI dose Still no regulr exercise ROS Denies recent fever or chills. Denies sinus pressure, nasal congestion, ear pain or sore throat. Denies chest congestion, productive cough or wheezing. Denies chest pains, palpitations and leg swelling Denies abdominal pain, nausea, vomiting,diarrhea or constipation.   Denies dysuria, frequency, hesitancy or incontinence. Denies joint pain, swelling and limitation in mobility. Denies headaches, seizures, numbness, or tingling. . Denies skin break down or rash.   PE  BP 114/64   Pulse 60   Resp 15   Ht 5\' 10"  (1.778 m)   Wt 230 lb (104.3 kg)   SpO2 95%   BMI 33.00 kg/m   Patient alert and oriented and in no cardiopulmonary distress.  HEENT: No facial asymmetry, EOMI,   oropharynx pink and moist.  Neck supple no JVD, no mass.  Chest: Clear to auscultation bilaterally.  CVS: S1, S2 no murmurs, no S3.Regular rate.  ABD: Soft non tender.   Ext: No edema  MS: Adequate ROM spine, shoulders, hips and knees.  Skin: Intact, no ulcerations or rash noted.  Psych: Good eye contact, normal affect. Memory intact not anxious or depressed appearing.  CNS: CN 2-12 intact, power,  normal  throughout.no focal deficits noted.   Assessment & Plan  Insomnia secondary to anxiety Uncontrolled experiencing flashbacks and symptoms of pTSD. No med change, needs therapy , and pt and spouse will pursue this through the [facility] Also needs to commit to regular exercise and healthier diet  Snoring Reason to test for sleep apnea is shows normal breathing pattern which is good  Obesity (BMI 30.0-34.9) Deteriorated. Patient re-educated about  the importance of commitment to a  minimum of 150 minutes of exercise per week.  The importance of healthy food choices with portion control discussed. Encouraged to start a food diary, count calories and to consider  joining a support group. Sample diet sheets offered. Goals set by the patient for the next several months.   Weight /BMI 06/04/2016 05/14/2016 03/23/2016  WEIGHT 230 lb 225 lb 223 lb  HEIGHT 5\' 10"  5\' 10"  5\' 10"   BMI 33 kg/m2 32.28 kg/m2 32 kg/m2      Vitamin D deficiency oTC vit D supplement  Hyperlipidemia LDL goal <100 Controlled, no change in medication Updated lab needed at/ before next visit. Hyperlipidemia:Low fat diet discussed and encouraged.   Lipid Panel  Lab Results  Component Value Date   CHOL 132 02/20/2016   HDL 53 02/20/2016   LDLCALC 68 02/20/2016   TRIG 54 02/20/2016   CHOLHDL 2.5 02/20/2016        Depression with anxiety Pt still needs to establish with psychiatry and along with his wife they agree to [facility], he is not  suicidal or homicidal, but reports a lot of mood fluctuation

## 2016-06-04 NOTE — Assessment & Plan Note (Signed)
Pt still needs to establish with psychiatry and along with his wife they agree to [facility], he is not suicidal or homicidal, but reports a lot of mood fluctuation

## 2016-06-04 NOTE — Patient Instructions (Addendum)
Annual wellness, in 4 month  MD follow up in 6 month  Please call and sched cardiolofgy appt due end May,   Fasting lipid, cmp, TSH and PSA end May , 2 days before cardiology appt  PLEASE sched appt with psychiatry and psycjhology for management of pTSD, night terror and depression  It is important that you exercise regularly at least 30 minutes 5 times a week. If you develop chest pain, have severe difficulty breathing, or feel very tired, stop exercising immediately and seek medical attention    Please work on good  health habits so that your health will improve. 1. Commitment to daily physical activity for 30 to 60  minutes, if you are able to do this.  2. Commitment to wise food choices. Aim for half of your  food intake to be vegetable and fruit, one quarter starchy foods, and one quarter protein. Try to eat on a regular schedule  3 meals per day, snacking between meals should be limited to vegetables or fruits or small portions of nuts. 64 ounces of water per day is generally recommended, unless you have specific health conditions, like heart failure or kidney failure where you will need to limit fluid intake.  3. Commitment to sufficient and a  good quality of physical and mental rest daily, generally between 6 to 8 hours per day.  WITH PERSISTANCE AND PERSEVERANCE, THE IMPOSSIBLE , BECOMES THE NORM!

## 2016-06-04 NOTE — Assessment & Plan Note (Signed)
Controlled, no change in medication Updated lab needed at/ before next visit. Hyperlipidemia:Low fat diet discussed and encouraged.   Lipid Panel  Lab Results  Component Value Date   CHOL 132 02/20/2016   HDL 53 02/20/2016   LDLCALC 68 02/20/2016   TRIG 54 02/20/2016   CHOLHDL 2.5 02/20/2016

## 2016-06-04 NOTE — Assessment & Plan Note (Signed)
Uncontrolled experiencing flashbacks and symptoms of pTSD. No med change, needs therapy , and pt and spouse will pursue this through the [facility] Also needs to commit to regular exercise and healthier diet

## 2016-06-04 NOTE — Assessment & Plan Note (Signed)
Reason to test for sleep apnea is shows normal breathing pattern which is good

## 2016-06-04 NOTE — Assessment & Plan Note (Signed)
Deteriorated. Patient re-educated about  the importance of commitment to a  minimum of 150 minutes of exercise per week.  The importance of healthy food choices with portion control discussed. Encouraged to start a food diary, count calories and to consider  joining a support group. Sample diet sheets offered. Goals set by the patient for the next several months.   Weight /BMI 06/04/2016 05/14/2016 03/23/2016  WEIGHT 230 lb 225 lb 223 lb  HEIGHT 5\' 10"  5\' 10"  5\' 10"   BMI 33 kg/m2 32.28 kg/m2 32 kg/m2

## 2016-06-04 NOTE — Assessment & Plan Note (Signed)
oTC vit D supplement

## 2016-06-24 ENCOUNTER — Emergency Department (HOSPITAL_COMMUNITY): Payer: Medicare Other

## 2016-06-24 ENCOUNTER — Encounter (HOSPITAL_COMMUNITY): Payer: Self-pay | Admitting: *Deleted

## 2016-06-24 ENCOUNTER — Observation Stay (HOSPITAL_COMMUNITY)
Admission: EM | Admit: 2016-06-24 | Discharge: 2016-06-25 | Disposition: A | Discharge status: Home / Self Care | Payer: Medicare Other | Attending: Family Medicine | Admitting: Family Medicine

## 2016-06-24 DIAGNOSIS — F17200 Nicotine dependence, unspecified, uncomplicated: Secondary | ICD-10-CM | POA: Diagnosis present

## 2016-06-24 DIAGNOSIS — R05 Cough: Secondary | ICD-10-CM | POA: Diagnosis not present

## 2016-06-24 DIAGNOSIS — R0603 Acute respiratory distress: Secondary | ICD-10-CM | POA: Diagnosis not present

## 2016-06-24 DIAGNOSIS — J40 Bronchitis, not specified as acute or chronic: Secondary | ICD-10-CM | POA: Diagnosis not present

## 2016-06-24 DIAGNOSIS — J309 Allergic rhinitis, unspecified: Secondary | ICD-10-CM

## 2016-06-24 DIAGNOSIS — R0602 Shortness of breath: Secondary | ICD-10-CM | POA: Diagnosis present

## 2016-06-24 DIAGNOSIS — Z7982 Long term (current) use of aspirin: Secondary | ICD-10-CM | POA: Insufficient documentation

## 2016-06-24 DIAGNOSIS — Z87891 Personal history of nicotine dependence: Secondary | ICD-10-CM | POA: Diagnosis not present

## 2016-06-24 DIAGNOSIS — J441 Chronic obstructive pulmonary disease with (acute) exacerbation: Secondary | ICD-10-CM

## 2016-06-24 LAB — CBC WITH DIFFERENTIAL/PLATELET
BASOS PCT: 0 %
Basophils Absolute: 0 10*3/uL (ref 0.0–0.1)
EOS ABS: 0.3 10*3/uL (ref 0.0–0.7)
Eosinophils Relative: 4 %
HCT: 41.3 % (ref 39.0–52.0)
Hemoglobin: 13.3 g/dL (ref 13.0–17.0)
Lymphocytes Relative: 19 %
Lymphs Abs: 1.5 10*3/uL (ref 0.7–4.0)
MCH: 30.1 pg (ref 26.0–34.0)
MCHC: 32.2 g/dL (ref 30.0–36.0)
MCV: 93.4 fL (ref 78.0–100.0)
Monocytes Absolute: 0.9 10*3/uL (ref 0.1–1.0)
Monocytes Relative: 12 %
NEUTROS PCT: 65 %
Neutro Abs: 5 10*3/uL (ref 1.7–7.7)
Platelets: 255 10*3/uL (ref 150–400)
RBC: 4.42 MIL/uL (ref 4.22–5.81)
RDW: 12.2 % (ref 11.5–15.5)
WBC: 7.7 10*3/uL (ref 4.0–10.5)

## 2016-06-24 LAB — BASIC METABOLIC PANEL
ANION GAP: 8 (ref 5–15)
BUN: 11 mg/dL (ref 6–20)
CO2: 25 mmol/L (ref 22–32)
Calcium: 9.1 mg/dL (ref 8.9–10.3)
Chloride: 104 mmol/L (ref 101–111)
Creatinine, Ser: 0.77 mg/dL (ref 0.61–1.24)
GFR calc non Af Amer: >60 mL/min (ref >60)
Glucose, Bld: 110 mg/dL — ABNORMAL HIGH (ref 65–99)
POTASSIUM: 3.6 mmol/L (ref 3.5–5.1)
SODIUM: 137 mmol/L (ref 135–145)

## 2016-06-24 LAB — BRAIN NATRIURETIC PEPTIDE: B NATRIURETIC PEPTIDE 5: 28 pg/mL (ref 0.0–100.0)

## 2016-06-24 LAB — D-DIMER, QUANTITATIVE: D-Dimer, Quant: 0.67 ug/mL-FEU — ABNORMAL HIGH (ref 0.00–0.50)

## 2016-06-24 LAB — TROPONIN I: Troponin I: <0.03 ng/mL (ref <0.03)

## 2016-06-24 MED ORDER — SODIUM CHLORIDE 0.9% FLUSH: 3.0000 mL | INTRAVENOUS | Status: DC | PRN

## 2016-06-24 MED ORDER — IPRATROPIUM-ALBUTEROL 0.5-2.5 (3) MG/3ML IN SOLN
3.0000 mL | Freq: Once | RESPIRATORY_TRACT | Status: AC
  Administered 2016-06-24: 3 mL via RESPIRATORY_TRACT
  Filled 2016-06-24: qty 3

## 2016-06-24 MED ORDER — ONDANSETRON HCL 4 MG/2ML IJ SOLN: 4.0000 mg | Freq: Four times a day (QID) | INTRAMUSCULAR | Status: DC | PRN

## 2016-06-24 MED ORDER — ADULT MULTIVITAMIN W/MINERALS CH
1.0000 | ORAL_TABLET | Freq: Every day | ORAL | Status: DC
  Administered 2016-06-24 – 2016-06-25 (×2): 1 via ORAL
  Filled 2016-06-24 (×2): qty 1

## 2016-06-24 MED ORDER — ATORVASTATIN CALCIUM 40 MG PO TABS
40.0000 mg | ORAL_TABLET | Freq: Every day | ORAL | Status: DC
  Administered 2016-06-24: 40 mg via ORAL
  Filled 2016-06-24: qty 1

## 2016-06-24 MED ORDER — SODIUM CHLORIDE 0.9% FLUSH
3.0000 mL | Freq: Two times a day (BID) | INTRAVENOUS | Status: DC
  Administered 2016-06-24 (×2): 3 mL via INTRAVENOUS

## 2016-06-24 MED ORDER — ACETAMINOPHEN 325 MG PO TABS: 650.0000 mg | ORAL_TABLET | Freq: Four times a day (QID) | ORAL | Status: DC | PRN

## 2016-06-24 MED ORDER — IPRATROPIUM-ALBUTEROL 0.5-2.5 (3) MG/3ML IN SOLN
3.0000 mL | Freq: Four times a day (QID) | RESPIRATORY_TRACT | Status: DC
  Administered 2016-06-24 – 2016-06-25 (×5): 3 mL via RESPIRATORY_TRACT
  Filled 2016-06-24 (×4): qty 3

## 2016-06-24 MED ORDER — ENOXAPARIN SODIUM 60 MG/0.6ML ~~LOC~~ SOLN
50.0000 mg | SUBCUTANEOUS | Status: DC
  Administered 2016-06-24: 50 mg via SUBCUTANEOUS
  Filled 2016-06-24: qty 0.6

## 2016-06-24 MED ORDER — DOXYCYCLINE HYCLATE 100 MG PO TABS
100.0000 mg | ORAL_TABLET | Freq: Two times a day (BID) | ORAL | Status: DC
  Administered 2016-06-24 – 2016-06-25 (×2): 100 mg via ORAL
  Filled 2016-06-24 (×2): qty 1

## 2016-06-24 MED ORDER — CITALOPRAM HYDROBROMIDE 20 MG PO TABS
40.0000 mg | ORAL_TABLET | Freq: Every day | ORAL | Status: DC
  Administered 2016-06-24 – 2016-06-25 (×2): 40 mg via ORAL
  Filled 2016-06-24 (×2): qty 2

## 2016-06-24 MED ORDER — LORATADINE 10 MG PO TABS
10.0000 mg | ORAL_TABLET | Freq: Every day | ORAL | Status: DC
  Administered 2016-06-24 – 2016-06-25 (×2): 10 mg via ORAL
  Filled 2016-06-24 (×2): qty 1

## 2016-06-24 MED ORDER — METHYLPREDNISOLONE SODIUM SUCC 40 MG IJ SOLR
40.0000 mg | Freq: Four times a day (QID) | INTRAMUSCULAR | Status: DC
  Administered 2016-06-24 – 2016-06-25 (×5): 40 mg via INTRAVENOUS
  Filled 2016-06-24 (×5): qty 1

## 2016-06-24 MED ORDER — HYDRALAZINE HCL 20 MG/ML IJ SOLN: 10.0000 mg | INTRAMUSCULAR | Status: DC | PRN

## 2016-06-24 MED ORDER — SENNOSIDES-DOCUSATE SODIUM 8.6-50 MG PO TABS: 1.0000 | ORAL_TABLET | Freq: Two times a day (BID) | ORAL | Status: DC | PRN

## 2016-06-24 MED ORDER — IPRATROPIUM-ALBUTEROL 0.5-2.5 (3) MG/3ML IN SOLN: 3.0000 mL | Freq: Once | RESPIRATORY_TRACT | Status: DC

## 2016-06-24 MED ORDER — DIPHENHYDRAMINE HCL 25 MG PO CAPS
ORAL_CAPSULE | ORAL | Status: AC
  Filled 2016-06-24: qty 1

## 2016-06-24 MED ORDER — TEMAZEPAM 15 MG PO CAPS: 15.0000 mg | ORAL_CAPSULE | Freq: Every evening | ORAL | Status: DC | PRN

## 2016-06-24 MED ORDER — CALCIUM CARBONATE-VITAMIN D 500-200 MG-UNIT PO TABS
1.0000 | ORAL_TABLET | Freq: Three times a day (TID) | ORAL | Status: DC
  Administered 2016-06-24 – 2016-06-25 (×4): 1 via ORAL
  Filled 2016-06-24 (×4): qty 1

## 2016-06-24 MED ORDER — PREDNISONE 10 MG PO TABS
60.0000 mg | ORAL_TABLET | Freq: Once | ORAL | Status: AC
  Administered 2016-06-24: 60 mg via ORAL
  Filled 2016-06-24: qty 1

## 2016-06-24 MED ORDER — ONDANSETRON HCL 4 MG PO TABS: 4.0000 mg | ORAL_TABLET | Freq: Four times a day (QID) | ORAL | Status: DC | PRN

## 2016-06-24 MED ORDER — ASPIRIN EC 81 MG PO TBEC
81.0000 mg | DELAYED_RELEASE_TABLET | Freq: Every day | ORAL | Status: DC
  Administered 2016-06-24 – 2016-06-25 (×2): 81 mg via ORAL
  Filled 2016-06-24 (×2): qty 1

## 2016-06-24 MED ORDER — MONTELUKAST SODIUM 10 MG PO TABS
10.0000 mg | ORAL_TABLET | Freq: Every day | ORAL | Status: DC
  Administered 2016-06-24: 10 mg via ORAL
  Filled 2016-06-24: qty 1

## 2016-06-24 MED ORDER — ACETAMINOPHEN 650 MG RE SUPP: 650.0000 mg | Freq: Four times a day (QID) | RECTAL | Status: DC | PRN

## 2016-06-24 MED ORDER — SODIUM CHLORIDE 0.9 % IV SOLN: 250.0000 mL | INTRAVENOUS | Status: DC | PRN

## 2016-06-24 MED ORDER — DOXYCYCLINE HYCLATE 100 MG PO TABS
100.0000 mg | ORAL_TABLET | Freq: Once | ORAL | Status: AC
  Administered 2016-06-24: 100 mg via ORAL
  Filled 2016-06-24: qty 1

## 2016-06-24 MED ORDER — DOXEPIN HCL 25 MG PO CAPS
25.0000 mg | ORAL_CAPSULE | Freq: Every day | ORAL | Status: DC
  Administered 2016-06-24: 25 mg via ORAL
  Filled 2016-06-24: qty 1

## 2016-06-24 NOTE — H&P (Signed)
History and Physical  MILOH ALCOCER ALP:[id number] DOB: [birth year] DOA: 06/24/2016  Referring physician: Wyvonnia Dusky PCP: Tula Nakayama, MD   Chief Complaint: Shortness of breath   HPI: Terry Love is a 81 y.o. male with probable COPD (history of smokeless tobacco use) presented to the emergency department with 2 days of coughing, shortness of breath, wheezing and chest congestion. He also reported occasional chills. The patient denies fever. The patient reports that he occasionally does get wheezing with upper respiratory illnesses. He does not report a history of asthma or COPD. He does not smoke cigarettes but does have a history of smokeless tobacco use. He denies chest pain and abdominal pain. He says that he initially had a scratchy throat that progressed and nasal drainage coughing and then subsequently difficulty breathing. He was treated in the emergency department with nebulizer treatments and steroids but continued to remain tachypnea And admission was requested for further evaluation and treatment.  Review of Systems: All systems reviewed and apart from history of presenting illness, are negative.  Past Medical History:  Diagnosis Date  . Anxiety   . Bradycardia    Chronic   . Colon polyps   . Depression   . Facial tic   . Hearing loss   . Hyperlipidemia   . 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 Obesity   . Seasonal allergies    Past Surgical History:  Procedure Laterality Date  . carpal tunnel  release right  2006  . COLONOSCOPY    . COLONOSCOPY  01/25/2011   Dr. Rehman:Examination performed to cecum Pan colonic diverticulosis/2 small polyps ablated via cold biopsy from transverse colon/External hemorrhoids, benign polyps  . COLONOSCOPY N/A 06/29/2013   Procedure: COLONOSCOPY;  Surgeon: Daneil Dolin, MD;  Location: [facility];  Service: Endoscopy;  Laterality: N/A;  10:45   Social History:  reports that he has never smoked. He quit  smokeless tobacco use about 3 months ago. His smokeless tobacco use included Chew. He reports that he drinks alcohol. He reports that he does not use drugs.  No Known Allergies  Family History  Problem Relation Age of Onset  . Colon cancer Mother     diagnosed at age 21  . Aneurysm Father   . Stroke Brother    Prior to Admission medications   Medication Sig Start Date End Date Taking? Authorizing Provider  aspirin (BAYER ASPIRIN EC LOW DOSE) 81 MG EC tablet Take 81 mg by mouth daily.      Historical Provider, MD  atorvastatin (LIPITOR) 40 MG tablet Take 1 tablet (40 mg total) by mouth at bedtime. 06/04/16   Fayrene Helper, MD  calcium-vitamin D (OSCAL WITH D) 500-200 MG-UNIT per tablet Take 1 tablet by mouth 3 (three) times daily.     Historical Provider, MD  citalopram (CELEXA) 40 MG tablet Take 1 tablet (40 mg total) by mouth daily. 06/04/16   Fayrene Helper, MD  citalopram (CELEXA) 40 MG tablet Take 1 tablet (40 mg total) by mouth daily. 06/04/16   Fayrene Helper, MD  doxepin (SINEQUAN) 25 MG capsule Take 25 mg by mouth at bedtime.    Historical Provider, MD  montelukast (SINGULAIR) 10 MG tablet TAKE 1 TABLET BY MOUTH AT BEDTIME 02/06/16   Fayrene Helper, MD  Multiple Vitamin (MULTIVITAMIN) tablet Take 1 tablet by mouth daily. Unspecified frequency    Historical Provider, MD  temazepam (RESTORIL) 15 MG capsule Take 1 capsule (15 mg total)  by mouth at bedtime as needed for sleep. 02/20/16   Fayrene Helper, MD  vitamin C (ASCORBIC ACID) 500 MG tablet Take 500 mg by mouth daily.    Historical Provider, MD  Vitamin D, Ergocalciferol, (DRISDOL) 50000 units CAPS capsule TAKE ONE CAPSULE BY MOUTH A WEEK 04/10/16   Fayrene Helper, MD  Wheat Dextrin (BENEFIBER DRINK MIX PO) Take by mouth. Use as directed daily    Historical Provider, MD   Physical Exam: Vitals:   06/24/16 0500 06/24/16 0600 06/24/16 0642 06/24/16 0730  BP: 136/81 (!) 178/90  124/83  Pulse: 78 70  72  Resp:  (!) 32 20  18  Temp:      SpO2: 94% 92% 95% 96%  Weight:      Height:         General exam: Moderately built and nourished patient, lying comfortably supine on the gurney in no obvious distress.  Head, eyes and ENT: Nontraumatic and normocephalic. Pupils equally reacting to light and accommodation. Oral mucosa moist.  Neck: Supple. No JVD, carotid bruit or thyromegaly.  Lymphatics: No lymphadenopathy.  Respiratory system: Diffuse expiratory wheezes. Mild tachypnea.  Cardiovascular system: S1 and S2 heard, RRR. No JVD, murmurs, gallops, clicks or pedal edema.  Gastrointestinal system: Abdomen is obese, nondistended, soft and nontender. Normal bowel sounds heard. No organomegaly or masses appreciated.  Central nervous system: Alert and oriented. No focal neurological deficits.  Extremities: Symmetric 5 x 5 power. Peripheral pulses symmetrically felt.   Skin: No rashes or acute findings.  Musculoskeletal system: Negative exam.  Psychiatry: Pleasant and cooperative.   Labs on Admission:  Basic Metabolic Panel:  Recent Labs Lab 06/24/16 0341  NA 137  K 3.6  CL 104  CO2 25  GLUCOSE 110*  BUN 11  CREATININE 0.77  CALCIUM 9.1   Liver Function Tests: No results for input(s): AST, ALT, ALKPHOS, BILITOT, PROT, ALBUMIN in the last 168 hours. No results for input(s): LIPASE, AMYLASE in the last 168 hours. No results for input(s): AMMONIA in the last 168 hours. CBC:  Recent Labs Lab 06/24/16 0341  WBC 7.7  NEUTROABS 5.0  HGB 13.3  HCT 41.3  MCV 93.4  PLT 255   Cardiac Enzymes:  Recent Labs Lab 06/24/16 0341  TROPONINI <0.03    BNP (last 3 results) No results for input(s): PROBNP in the last 8760 hours. CBG: No results for input(s): GLUCAP in the last 168 hours.  Radiological Exams on Admission: Dg Neck Soft Tissue  Result Date: 06/24/2016 CLINICAL DATA:  Cough and shortness of breath.  Stridor. EXAM: NECK SOFT TISSUES - 1+ VIEW COMPARISON:  None.  FINDINGS: There is no evidence of retropharyngeal soft tissue swelling or epiglottic enlargement. The cervical airway is unremarkable and no radio-opaque foreign body identified. IMPRESSION: No radiographic evidence of supraglottitis or other cervical airway obstruction. Electronically Signed   By: Ulyses Jarred M.D.   On: 06/24/2016 05:49   Dg Chest 2 View  Result Date: 06/24/2016 CLINICAL DATA:  Cough, shortness of breath and wheezing EXAM: CHEST  2 VIEW COMPARISON:  Chest radiograph 06/16/2015 FINDINGS: Mild cardiomegaly. No focal airspace consolidation or pulmonary edema. No pneumothorax or pleural effusion. IMPRESSION: Mild cardiomegaly without focal airspace disease. Electronically Signed   By: Ulyses Jarred M.D.   On: 06/24/2016 02:45   Assessment/Plan Active Problems:   SMOKELESS TOBACCO ABUSE   Allergic rhinitis   Acute respiratory distress  Acute respiratory distress secondary to suspected acute COPD exacerbation-some improvement with therapy started  in the emergency department however patient remaining tachypneic and will be admitted for observation,  continue scheduled nebulizer treatments, and IV steroids and continue doxycycline. If he improves he likely can go home tomorrow.     DVT Prophylaxis: enoxaparin Code Status: full  Family Communication: wife at bedside Disposition Plan: Home  Time spent: 31 mins  Irwin Brakeman, MD [facility] Pager [phone]  If 7PM-7AM, please contact night-coverage [email or web address] Password [facility] 06/24/2016, 8:17 AM

## 2016-06-24 NOTE — Progress Notes (Signed)
Appears to be doing well with auto BiPAP.

## 2016-06-24 NOTE — ED Notes (Signed)
Attempted to draw blood for labs x 2 unsuccessfully

## 2016-06-24 NOTE — ED Notes (Signed)
Ron, RN attempted to obtain blood x 3 without success.  Notified lab tech who will try to obtain blood.

## 2016-06-24 NOTE — ED Triage Notes (Signed)
Pt c/o cough, sob, wheezing that started yesterday,

## 2016-06-24 NOTE — ED Notes (Signed)
HR 83 and Pulse OX 95% with ambulation

## 2016-06-24 NOTE — Progress Notes (Addendum)
Patient does not wear CPAP at home. Does have history of anxiety with insomnia also Hx of snoring. He states he only sleeps about 4 hours a night. He most likely is a candidate for a CPAP/BiPAP. He is willing to try.

## 2016-06-24 NOTE — ED Notes (Signed)
ED Provider at bedside. 

## 2016-06-24 NOTE — ED Provider Notes (Signed)
[facility] DEPT Provider Note   CSN: [id number] Arrival date & time: 06/24/16  0037     History   Chief Complaint Chief Complaint  Patient presents with  . Shortness of Breath    HPI Terry Love is a 81 y.o. male.  Patient presents with 2 day history of coughing, shortness of breath, wheezing, congestion and chills. No documented fevers. No chest pain. No sick contacts. Denies any history of asthma or COPD. Does not smoke. Did receive flu shot and pneumonia shot. Good by mouth intake and urine output. No nausea or vomiting. No abdominal pain. No back pain. Started as a scratchy throat and progressed to nasal drainage, coughing and difficulty breathing. No leg swelling. No history of heart failure.   The history is provided by the patient.  Shortness of Breath  Associated symptoms include rhinorrhea and cough. Pertinent negatives include no fever, no headaches, no chest pain, no vomiting, no abdominal pain, no rash and no leg swelling.    Past Medical History:  Diagnosis Date  . Anxiety   . Bradycardia    Chronic   . Colon polyps   . Depression   . Facial tic   . Hearing loss   . Hyperlipidemia   . 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 Obesity   . Seasonal allergies     Patient Active Problem List   Diagnosis Date Noted  . Snoring 02/20/2016  . Carpal tunnel syndrome 08/27/2015  . Insomnia secondary to anxiety 06/19/2015  . Sinus bradycardia, chronic 11/14/2014  . Hyperpigmented skin lesion 11/14/2014  . Vitamin D deficiency 02/15/2014  . Anemia 01/25/2012  . Depression with anxiety 12/13/2010  . Allergic rhinitis 05/19/2010  . SMOKELESS TOBACCO ABUSE 10/26/2009  . Hearing loss 12/23/2007  . OSTEOPENIA 12/23/2007  . Hyperlipidemia LDL goal <100 09/08/2007  . Obesity (BMI 30.0-34.9) 09/08/2007    Past Surgical History:  Procedure Laterality Date  . carpal tunnel  release right  2006  . COLONOSCOPY    . COLONOSCOPY  01/25/2011   Dr. Rehman:Examination performed to cecum Pan colonic diverticulosis/2 small polyps ablated via cold biopsy from transverse colon/External hemorrhoids, benign polyps  . COLONOSCOPY N/A 06/29/2013   Procedure: COLONOSCOPY;  Surgeon: Daneil Dolin, MD;  Location: [facility];  Service: Endoscopy;  Laterality: N/A;  10:45       Home Medications    Prior to Admission medications   Medication Sig Start Date End Date Taking? Authorizing Provider  aspirin (BAYER ASPIRIN EC LOW DOSE) 81 MG EC tablet Take 81 mg by mouth daily.      Historical Provider, MD  atorvastatin (LIPITOR) 40 MG tablet Take 1 tablet (40 mg total) by mouth at bedtime. 06/04/16   Fayrene Helper, MD  calcium-vitamin D (OSCAL WITH D) 500-200 MG-UNIT per tablet Take 1 tablet by mouth 3 (three) times daily.     Historical Provider, MD  citalopram (CELEXA) 40 MG tablet Take 1 tablet (40 mg total) by mouth daily. 06/04/16   Fayrene Helper, MD  citalopram (CELEXA) 40 MG tablet Take 1 tablet (40 mg total) by mouth daily. 06/04/16   Fayrene Helper, MD  doxepin (SINEQUAN) 25 MG capsule Take 25 mg by mouth at bedtime.    Historical Provider, MD  montelukast (SINGULAIR) 10 MG tablet TAKE 1 TABLET BY MOUTH AT BEDTIME 02/06/16   Fayrene Helper, MD  Multiple Vitamin (MULTIVITAMIN) tablet Take 1 tablet by mouth daily. Unspecified frequency    Historical  Provider, MD  temazepam (RESTORIL) 15 MG capsule Take 1 capsule (15 mg total) by mouth at bedtime as needed for sleep. 02/20/16   Fayrene Helper, MD  vitamin C (ASCORBIC ACID) 500 MG tablet Take 500 mg by mouth daily.    Historical Provider, MD  Vitamin D, Ergocalciferol, (DRISDOL) 50000 units CAPS capsule TAKE ONE CAPSULE BY MOUTH A WEEK 04/10/16   Fayrene Helper, MD  Wheat Dextrin (BENEFIBER DRINK MIX PO) Take by mouth. Use as directed daily    Historical Provider, MD    Family History Family History  Problem Relation Age of Onset  . Colon cancer Mother     diagnosed  at age 43  . Aneurysm Father   . Stroke Brother     Social History Social History  Substance Use Topics  . Smoking status: Never Smoker  . Smokeless tobacco: Former Systems developer    Types: [place] date: 03/13/2016  . Alcohol use 0.0 oz/week     Comment: occasional     Allergies   Patient has no known allergies.   Review of Systems Review of Systems  Constitutional: Positive for activity change, appetite change and fatigue. Negative for fever.  HENT: Positive for congestion and rhinorrhea.   Respiratory: Positive for cough, chest tightness and shortness of breath.   Cardiovascular: Negative for chest pain and leg swelling.  Gastrointestinal: Negative for abdominal pain, nausea and vomiting.  Genitourinary: Negative for dysuria, hematuria and urgency.  Musculoskeletal: Negative for arthralgias and myalgias.  Skin: Negative for rash.  Neurological: Negative for dizziness, weakness and headaches.  A complete 10 system review of systems was obtained and all systems are negative except as noted in the HPI and PMH.     Physical Exam Updated Vital Signs BP (!) 150/84   Pulse 62   Temp 100.2 F (37.9 C)   Resp (!) 24   Ht 5\' 10"  (1.778 m)   Wt 230 lb (104.3 kg)   SpO2 96%   BMI 33.00 kg/m   Physical Exam  Constitutional: He is oriented to person, place, and time. He appears well-developed and well-nourished. No distress.  Speaking in full sentences Mild tachypnea  HENT:  Head: Normocephalic and atraumatic.  Mouth/Throat: Oropharynx is clear and moist. No oropharyngeal exudate.  +nasal congestion  Eyes: Conjunctivae and EOM are normal. Pupils are equal, round, and reactive to light.  Neck: Normal range of motion. Neck supple.  No meningismus.  Cardiovascular: Normal rate, regular rhythm, normal heart sounds and intact distal pulses.   No murmur heard. Pulmonary/Chest: Effort normal. No respiratory distress. He has wheezes.  Diminished breath sounds with faint  expiratory wheezing Upper airway congestion  Abdominal: Soft. There is no tenderness. There is no rebound and no guarding.  Musculoskeletal: Normal range of motion. He exhibits no edema or tenderness.  Neurological: He is alert and oriented to person, place, and time. No cranial nerve deficit. He exhibits normal muscle tone. Coordination normal.  No ataxia on finger to nose bilaterally. No pronator drift. 5/5 strength throughout. CN 2-12 intact.Equal grip strength. Sensation intact.   Skin: Skin is warm.  Psychiatric: He has a normal mood and affect. His behavior is normal.  Nursing note and vitals reviewed.    ED Treatments / Results  Labs (all labs ordered are listed, but only abnormal results are displayed) Labs Reviewed  BASIC METABOLIC PANEL - Abnormal; Notable for the following:       Result Value   Glucose, Bld  110 (*)    All other components within normal limits  D-DIMER, QUANTITATIVE (NOT AT [facility]) - Abnormal; Notable for the following:    D-Dimer, Quant 0.67 (*)    All other components within normal limits  CBC WITH DIFFERENTIAL/PLATELET  TROPONIN I  BRAIN NATRIURETIC PEPTIDE    EKG  EKG Interpretation  Date/Time:  Sunday June 24 2016 01:28:07 EDT Ventricular Rate:  60 PR Interval:  214 QRS Duration: 90 QT Interval:  416 QTC Calculation: 416 R Axis:   26 Text Interpretation:  Sinus rhythm with 1st degree A-V block Nonspecific T wave abnormality Abnormal ECG No significant change was found Confirmed by Wyvonnia Dusky  MD, Skyann Ganim [id number]) on 06/24/2016 1:36:52 AM       Radiology Dg Chest 2 View  Result Date: 06/24/2016 CLINICAL DATA:  Cough, shortness of breath and wheezing EXAM: CHEST  2 VIEW COMPARISON:  Chest radiograph 06/16/2015 FINDINGS: Mild cardiomegaly. No focal airspace consolidation or pulmonary edema. No pneumothorax or pleural effusion. IMPRESSION: Mild cardiomegaly without focal airspace disease. Electronically Signed   By: Ulyses Jarred M.D.   On:  06/24/2016 02:45    Procedures Procedures (including critical care time)  Medications Ordered in ED Medications  ipratropium-albuterol (DUONEB) 0.5-2.5 (3) MG/3ML nebulizer solution 3 mL (3 mLs Nebulization Given 06/24/16 0152)     Initial Impression / Assessment and Plan / ED Course  I have reviewed the triage vital signs and the nursing notes.  Pertinent labs & imaging results that were available during my care of the patient were reviewed by me and considered in my medical decision making (see chart for details).    URI symptoms with coughing, sore throat, wheezing.  No chest pain. No history of asthma or COPD.  Patient with minimal wheezing on exam. Does have upper airway congestion. No stridor.  Chest x-ray shows no pneumonia. He is able to ambulate without desaturation after receiving 2 nebulizers, steroids, and antibiotics.  Labs are reassuring. Age-adjusted d-dimer is negative.  No hypoxia with ambulation but does become quite short of breath. Still tachypneic at rest and having difficulty speaking. He is agreeable to observation admission. D/w Dr. Wynetta Emery. Final Clinical Impressions(s) / ED Diagnoses   Final diagnoses:  Bronchitis    New Prescriptions New Prescriptions   No medications on file     Ezequiel Essex, MD 06/24/16 [phone]

## 2016-06-25 DIAGNOSIS — J40 Bronchitis, not specified as acute or chronic: Secondary | ICD-10-CM | POA: Diagnosis not present

## 2016-06-25 DIAGNOSIS — F17200 Nicotine dependence, unspecified, uncomplicated: Secondary | ICD-10-CM

## 2016-06-25 DIAGNOSIS — R0603 Acute respiratory distress: Secondary | ICD-10-CM | POA: Diagnosis not present

## 2016-06-25 DIAGNOSIS — J309 Allergic rhinitis, unspecified: Secondary | ICD-10-CM | POA: Diagnosis not present

## 2016-06-25 LAB — RESPIRATORY PANEL BY PCR
ADENOVIRUS-RVPPCR: NOT DETECTED
Bordetella pertussis: NOT DETECTED
CHLAMYDOPHILA PNEUMONIAE-RVPPCR: NOT DETECTED
CORONAVIRUS HKU1-RVPPCR: NOT DETECTED
CORONAVIRUS NL63-RVPPCR: NOT DETECTED
Coronavirus 229E: NOT DETECTED
Coronavirus OC43: NOT DETECTED
Influenza A: NOT DETECTED
Influenza B: NOT DETECTED
Metapneumovirus: NOT DETECTED
Mycoplasma pneumoniae: NOT DETECTED
PARAINFLUENZA VIRUS 3-RVPPCR: NOT DETECTED
Parainfluenza Virus 1: NOT DETECTED
Parainfluenza Virus 2: NOT DETECTED
Parainfluenza Virus 4: NOT DETECTED
RHINOVIRUS / ENTEROVIRUS - RVPPCR: DETECTED — AB
Respiratory Syncytial Virus: NOT DETECTED

## 2016-06-25 MED ORDER — LORATADINE 10 MG PO TABS: 10.0000 mg | ORAL_TABLET | Freq: Every day | ORAL | 0 refills | Status: DC

## 2016-06-25 MED ORDER — DOXYCYCLINE HYCLATE 100 MG PO TABS: 100.0000 mg | ORAL_TABLET | Freq: Two times a day (BID) | ORAL | 0 refills | Status: AC

## 2016-06-25 MED ORDER — ALBUTEROL SULFATE HFA 108 (90 BASE) MCG/ACT IN AERS: 2.0000 | INHALATION_SPRAY | RESPIRATORY_TRACT | 1 refills | Status: DC | PRN

## 2016-06-25 MED ORDER — PREDNISONE 20 MG PO TABS: ORAL_TABLET | ORAL | 0 refills | Status: DC

## 2016-06-25 NOTE — Care Management Note (Signed)
Case Management Note  Patient Details  Name: Terry Love MRN: [id number] Date of Birth: Jun 18, 1935  Subjective/Objective:                  Pt admitted with resp distress. Pt from home, lives with wife and is ind with ADL's. Pt has PCP, transportation to appointments and no difficulty affording medications.   Action/Plan: He plans to return home with self care.    Expected Discharge Date:  06/25/16               Expected Discharge Plan:  Home/Self Care  In-House Referral:  NA  Discharge planning Services  CM Consult  Post Acute Care Choice:  NA Choice offered to:  NA  Status of Service:  Completed, signed off  Sherald Barge, RN 06/25/2016, 11:12 AM

## 2016-06-25 NOTE — Progress Notes (Signed)
Patient off BiPAP and asleep

## 2016-06-25 NOTE — Discharge Summary (Signed)
Physician Discharge Summary  Terry Love:[id number] DOB: 09-Dec-1935 DOA: 06/24/2016  PCP: Tula Nakayama, MD  Admit date: 06/24/2016 Discharge date: 06/25/2016  Admitted From: Home  Disposition:  Home   Recommendations for Outpatient Follow-up:  1. Follow up with PCP in 1 weeks 2. Please consider outpatient echocardiogram to evaluate cardiomegaly seen on chest xray.  3. Please consider outpatient sleep study.   4. Please follow up final respiratory virus panel results  Discharge Condition: stable  CODE STATUS: FULL Diet recommendation: Heart Healthy  Brief/Interim Summary: HPI: Terry Love is a 81 y.o. male with probable COPD (history of smokeless tobacco use) presented to the emergency department with 2 days of coughing, shortness of breath, wheezing and chest congestion. He also reported occasional chills. The patient denies fever. The patient reports that he occasionally does get wheezing with upper respiratory illnesses. He does not report a history of asthma or COPD. He does not smoke cigarettes but does have a history of smokeless tobacco use. He denies chest pain and abdominal pain. He says that he initially had a scratchy throat that progressed and nasal drainage coughing and then subsequently difficulty breathing. He was treated in the emergency department with nebulizer treatments and steroids but continued to remain tachypnea And admission was requested for further evaluation and treatment.  Acute respiratory distress secondary to suspected acute COPD exacerbation-some improvement with therapy started in the emergency department however patient remained tachypneic and was admitted for observation,  to continue scheduled nebulizer treatments, and IV steroids and continue doxycycline.  He improved with this therapy and he was sent home with close follow up with PCP.  He had mild elevated d dimer but when age adjusted was within normal limits and had no hypoxia on room air,  no tachycardia and no EKG changes to suggest VTE, also had no chest pain.    DVT Prophylaxis: enoxaparin Code Status: full  Family Communication: wife at bedside Disposition Plan: Home  Discharge Diagnoses:  Active Problems:   SMOKELESS TOBACCO ABUSE   Allergic rhinitis   Acute respiratory distress  Discharge Instructions  Discharge Instructions    Increase activity slowly    Complete by:  As directed      Allergies as of 06/25/2016   No Known Allergies     Medication List    TAKE these medications   albuterol 108 (90 Base) MCG/ACT inhaler Commonly known as:  PROVENTIL HFA;VENTOLIN HFA Inhale 2 puffs into the lungs every 4 (four) hours as needed for wheezing or shortness of breath (cough, shortness of breath or wheezing.).   atorvastatin 40 MG tablet Commonly known as:  LIPITOR Take 1 tablet (40 mg total) by mouth at bedtime.   BAYER ASPIRIN EC LOW DOSE 81 MG EC tablet Generic drug:  aspirin Take 81 mg by mouth daily.   BENEFIBER DRINK MIX PO Take by mouth. Use as directed daily   calcium-vitamin D 500-200 MG-UNIT tablet Commonly known as:  OSCAL WITH D Take 1 tablet by mouth 3 (three) times daily.   citalopram 40 MG tablet Commonly known as:  CELEXA Take 1 tablet (40 mg total) by mouth daily.   doxepin 25 MG capsule Commonly known as:  SINEQUAN Take 25 mg by mouth at bedtime.   doxycycline 100 MG tablet Commonly known as:  VIBRA-TABS Take 1 tablet (100 mg total) by mouth every 12 (twelve) hours.   loratadine 10 MG tablet Commonly known as:  CLARITIN Take 1 tablet (10 mg total) by mouth daily.  montelukast 10 MG tablet Commonly known as:  SINGULAIR TAKE 1 TABLET BY MOUTH AT BEDTIME   multivitamin tablet Take 1 tablet by mouth daily. Unspecified frequency   predniSONE 20 MG tablet Commonly known as:  DELTASONE Take 3 PO QAM x3days, 2 PO QAM x3days, 1 PO QAM x3days   temazepam 15 MG capsule Commonly known as:  RESTORIL Take 1 capsule (15 mg  total) by mouth at bedtime as needed for sleep.   vitamin C 500 MG tablet Commonly known as:  ASCORBIC ACID Take 500 mg by mouth daily.   Vitamin D (Ergocalciferol) 50000 units Caps capsule Commonly known as:  DRISDOL TAKE ONE CAPSULE BY MOUTH A WEEK      Follow-up Information    Tula Nakayama, MD. Schedule an appointment as soon as possible for a visit in 1 week(s).   Specialty:  Family Medicine Contact information: [address] [place] [address] [phone]          No Known Allergies  Procedures/Studies: Dg Neck Soft Tissue  Result Date: 06/24/2016 CLINICAL DATA:  Cough and shortness of breath.  Stridor. EXAM: NECK SOFT TISSUES - 1+ VIEW COMPARISON:  None. FINDINGS: There is no evidence of retropharyngeal soft tissue swelling or epiglottic enlargement. The cervical airway is unremarkable and no radio-opaque foreign body identified. IMPRESSION: No radiographic evidence of supraglottitis or other cervical airway obstruction. Electronically Signed   By: Ulyses Jarred M.D.   On: 06/24/2016 05:49   Dg Chest 2 View  Result Date: 06/24/2016 CLINICAL DATA:  Cough, shortness of breath and wheezing EXAM: CHEST  2 VIEW COMPARISON:  Chest radiograph 06/16/2015 FINDINGS: Mild cardiomegaly. No focal airspace consolidation or pulmonary edema. No pneumothorax or pleural effusion. IMPRESSION: Mild cardiomegaly without focal airspace disease. Electronically Signed   By: Ulyses Jarred M.D.   On: 06/24/2016 02:45   Subjective: Pt says he is feeling a lot better today.  He rested well overnight.    Discharge Exam: Vitals:   06/25/16 0354 06/25/16 0630  BP:  (!) 145/50  Pulse: 73 75  Resp: 20   Temp:  97.5 F (36.4 C)   Vitals:   06/25/16 0350 06/25/16 0354 06/25/16 0630 06/25/16 0810  BP:   (!) 145/50   Pulse:  73 75   Resp:  20    Temp:   97.5 F (36.4 C)   TempSrc:   Oral   SpO2: 95% 95% 100% 98%  Weight:      Height:       General: Pt is alert, awake,  not in acute distress Cardiovascular: RRR, S1/S2 +, no rubs, no gallops Respiratory: CTA bilaterally, no wheezing, no rhonchi Abdominal: Soft, NT, ND, bowel sounds + Extremities: no edema, no cyanosis, no calf cords or swelling felt.   The results of significant diagnostics from this hospitalization (including imaging, microbiology, ancillary and laboratory) are listed below for reference.     Microbiology: No results found for this or any previous visit (from the past 240 hour(s)).   Labs: BNP (last 3 results)  Recent Labs  06/24/16 0341  BNP 70.2   Basic Metabolic Panel:  Recent Labs Lab 06/24/16 0341  NA 137  K 3.6  CL 104  CO2 25  GLUCOSE 110*  BUN 11  CREATININE 0.77  CALCIUM 9.1   Liver Function Tests: No results for input(s): AST, ALT, ALKPHOS, BILITOT, PROT, ALBUMIN in the last 168 hours. No results for input(s): LIPASE, AMYLASE in the last 168 hours. No  results for input(s): AMMONIA in the last 168 hours. CBC:  Recent Labs Lab 06/24/16 0341  WBC 7.7  NEUTROABS 5.0  HGB 13.3  HCT 41.3  MCV 93.4  PLT 255   Cardiac Enzymes:  Recent Labs Lab 06/24/16 0341  TROPONINI <0.03   BNP: Invalid input(s): POCBNP CBG: No results for input(s): GLUCAP in the last 168 hours. D-Dimer  Recent Labs  06/24/16 0341  DDIMER 0.67*   Hgb A1c No results for input(s): HGBA1C in the last 72 hours. Lipid Profile No results for input(s): CHOL, HDL, LDLCALC, TRIG, CHOLHDL, LDLDIRECT in the last 72 hours. Thyroid function studies No results for input(s): TSH, T4TOTAL, T3FREE, THYROIDAB in the last 72 hours.  Invalid input(s): FREET3 Anemia work up No results for input(s): VITAMINB12, FOLATE, FERRITIN, TIBC, IRON, RETICCTPCT in the last 72 hours. Urinalysis    Component Value Date/Time   BILIRUBINUR negative 11/24/2012 1350   PROTEINUR negative 11/24/2012 1350   UROBILINOGEN 0.2 11/24/2012 1350   NITRITE negative 11/24/2012 1350   LEUKOCYTESUR Negative  11/24/2012 1350   Sepsis Labs Invalid input(s): PROCALCITONIN,  WBC,  LACTICIDVEN Microbiology No results found for this or any previous visit (from the past 240 hour(s)).  Time coordinating discharge: Over 30 minutes  SIGNED:  Irwin Brakeman, MD  [facility] 06/25/2016, 10:10 AM Pager   If 7PM-7AM, please contact night-coverage [email or web address] Password [email or web address]

## 2016-06-25 NOTE — Care Management Obs Status (Signed)
[place] NOTIFICATION   Patient Details  Name: Terry Love MRN: [id number] Date of Birth: 1935-05-26   Medicare Observation Status Notification Given:  Yes    Sherald Barge, RN 06/25/2016, 11:12 AM

## 2016-06-25 NOTE — Progress Notes (Signed)
Patient's IV removed.  Site WNL.  AVS reviewed with patient and patient's wife.  Verbalized understanding of discharge instructions, physician follow-up, medications.  Patient reports belongings in possession at discharge.  Patient transported by NT to main entrance for discharge.  Patient stable at discharge.

## 2016-06-25 NOTE — Discharge Instructions (Signed)
How to Use a Metered Dose Inhaler A metered dose inhaler is a handheld device for taking medicine that must be breathed into the lungs (inhaled). The device can be used to deliver a variety of inhaled medicines, including:  Quick relief or rescue medicines, such as bronchodilators.  Controller medicines, such as corticosteroids. The medicine is delivered by pushing down on a metal canister to release a preset amount of spray and medicine. Each device contains the amount of medicine that is needed for a preset number of uses (inhalations). Your health care provider may recommend that you use a spacer with your inhaler to help you take the medicine more effectively. A spacer is a plastic tube with a mouthpiece on one end and an opening that connects to the inhaler on the other end. A spacer holds the medicine in a tube for a short time, which allows you to inhale more medicine. What are the risks? If you do not use your inhaler correctly, medicine might not reach your lungs to help you breathe. Inhaler medicine can cause side effects, such as:  Mouth or throat infection.  Cough.  Hoarseness.  Headache.  Nausea and vomiting.  Lung infection (pneumonia) in people who have a lung condition called COPD. How to use a metered dose inhaler without a spacer 1. Remove the cap from the inhaler. 2. If you are using the inhaler for the first time, shake it for 5 seconds, turn it away from your face, then release 4 puffs into the air. This is called priming. 3. Shake the inhaler for 5 seconds. 4. Position the inhaler so the top of the canister faces up. 5. Put your index finger on the top of the medicine canister. Support the bottom of the inhaler with your thumb. 6. Breathe out normally and as completely as possible, away from the inhaler. 7. Either place the inhaler between your teeth and close your lips tightly around the mouthpiece, or hold the inhaler 1-2 inches (2.5-5 cm) away from your open  mouth. Keep your tongue down out of the way. If you are unsure which technique to use, ask your health care provider. 8. Press the canister down with your index finger to release the medicine, then inhale deeply and slowly through your mouth (not your nose) until your lungs are completely filled. Inhaling should take 4-6 seconds. 9. Hold the medicine in your lungs for 5-10 seconds (10 seconds is best). This helps the medicine get into the small airways of your lungs. 10. With your lips in a tight circle (pursed), breathe out slowly. 11. Repeat steps 3-10 until you have taken the number of puffs that your health care provider directed. Wait about 1 minute between puffs or as directed. 12. Put the cap on the inhaler. 13. If you are using a steroid inhaler, rinse your mouth with water, gargle, and spit out the water. Do not swallow the water. How to use a metered dose inhaler with a spacer 1. Remove the cap from the inhaler. 2. If you are using the inhaler for the first time, shake it for 5 seconds, turn it away from your face, then release 4 puffs into the air. This is called priming. 3. Shake the inhaler for 5 seconds. 4. Place the open end of the spacer onto the inhaler mouthpiece. 5. Position the inhaler so the top of the canister faces up and the spacer mouthpiece faces you. 6. Put your index finger on the top of the medicine canister. Support the  bottom of the inhaler and the spacer with your thumb. 7. Breathe out normally and as completely as possible, away from the spacer. 8. Place the spacer between your teeth and close your lips tightly around it. Keep your tongue down out of the way. 9. Press the canister down with your index finger to release the medicine, then inhale deeply and slowly through your mouth (not your nose) until your lungs are completely filled. Inhaling should take 4-6 seconds. 10. Hold the medicine in your lungs for 5-10 seconds (10 seconds is best). This helps the medicine  get into the small airways of your lungs. 11. With your lips in a tight circle (pursed), breathe out slowly. 12. Repeat steps 3-11 until you have taken the number of puffs that your health care provider directed. Wait about 1 minute between puffs or as directed. 13. Remove the spacer from the inhaler and put the cap on the inhaler. 14. If you are using a steroid inhaler, rinse your mouth with water, gargle, and spit out the water. Do not swallow the water. Follow these instructions at home:  Take your inhaled medicine only as told by your health care provider. Do not use the inhaler more than directed by your health care provider.  Keep all follow-up visits as told by your health care provider. This is important.  If your inhaler has a counter, you can check it to determine how full your inhaler is. If your inhaler does not have a counter, ask your health care provider when you will need to refill your inhaler and write the refill date on a calendar or on your inhaler canister. Note that you cannot know when an inhaler is empty by shaking it.  Follow directions on the package insert for care and cleaning of your inhaler and spacer. Contact a health care provider if:  Symptoms are only partially relieved with your inhaler.  You are having trouble using your inhaler.  You have an increase in phlegm.  You have headaches. Get help right away if:  You feel little or no relief after using your inhaler.  You have dizziness.  You have a fast heart rate.  You have chills or a fever.  You have night sweats.  There is blood in your phlegm. Summary  A metered dose inhaler is a handheld device for taking medicine that must be breathed into the lungs (inhaled).  The medicine is delivered by pushing down on a metal canister to release a preset amount of spray and medicine.  Each device contains the amount of medicine that is needed for a preset number of uses (inhalations). This  information is not intended to replace advice given to you by your health care provider. Make sure you discuss any questions you have with your health care provider. Document Released: 03/26/2005 Document Revised: 02/14/2016 Document Reviewed: 02/14/2016 Elsevier Interactive Patient Education  2017 [employer].   Upper Respiratory Infection, Adult Most upper respiratory infections (URIs) are caused by a virus. A URI affects the nose, throat, and upper air passages. The most common type of URI is often called "the common cold." Follow these instructions at home:  Take medicines only as told by your doctor.  Gargle warm saltwater or take cough drops to comfort your throat as told by your doctor.  Use a warm mist humidifier or inhale steam from a shower to increase air moisture. This may make it easier to breathe.  Drink enough fluid to keep your pee (urine) clear  or pale yellow.  Eat soups and other clear broths.  Have a healthy diet.  Rest as needed.  Go back to work when your fever is gone or your doctor says it is okay.  You may need to stay home longer to avoid giving your URI to others.  You can also wear a face mask and wash your hands often to prevent spread of the virus.  Use your inhaler more if you have asthma.  Do not use any tobacco products, including cigarettes, chewing tobacco, or electronic cigarettes. If you need help quitting, ask your doctor. Contact a doctor if:  You are getting worse, not better.  Your symptoms are not helped by medicine.  You have chills.  You are getting more short of breath.  You have brown or red mucus.  You have yellow or brown discharge from your nose.  You have pain in your face, especially when you bend forward.  You have a fever.  You have puffy (swollen) neck glands.  You have pain while swallowing.  You have white areas in the back of your throat. Get help right away if:  You have very bad or  constant:  Headache.  Ear pain.  Pain in your forehead, behind your eyes, and over your cheekbones (sinus pain).  Chest pain.  You have long-lasting (chronic) lung disease and any of the following:  Wheezing.  Long-lasting cough.  Coughing up blood.  A change in your usual mucus.  You have a stiff neck.  You have changes in your:  Vision.  Hearing.  Thinking.  Mood. This information is not intended to replace advice given to you by your health care provider. Make sure you discuss any questions you have with your health care provider. Document Released: 09/12/2007 Document Revised: 11/27/2015 Document Reviewed: 07/01/2013 Elsevier Interactive Patient Education  2017 [employer].

## 2016-06-27 ENCOUNTER — Telehealth: Payer: Self-pay

## 2016-06-27 NOTE — Telephone Encounter (Signed)
Transition Care Management Follow-up Telephone Call   Date discharged? 06/25/2016   How have you been since you were released from the hospital? Feels like he is doing better. He is still wheezing and coughing up clear phlegm and slight SOB. Currently taking prednisone, doxycycline, and albuterol inhaler.   Do you understand why you were in the hospital? yes   Do you understand the discharge instructions? yes   Where were you discharged to? Home   Items Reviewed:  Medications reviewed: yes  Allergies reviewed: yes  Dietary changes reviewed: yes  Referrals reviewed: yes   Functional Questionnaire:   Activities of Daily Living (ADLs):   He states they are independent in the following: ambulation, bathing and hygiene, feeding, continence, grooming, toileting and dressing States they require assistance with the following: None   Any transportation issues/concerns?: no   Any patient concerns? no   Confirmed importance and date/time of follow-up visits scheduled yes Provider Appointment booked with Dr. Moshe Cipro on 07/02/2016.  Confirmed with patient if condition begins to worsen call PCP or go to the ER.  Patient was given the office number and encouraged to call back with question or concerns.  : yes

## 2016-07-02 ENCOUNTER — Encounter: Payer: Self-pay | Admitting: Family Medicine

## 2016-07-02 ENCOUNTER — Ambulatory Visit (INDEPENDENT_AMBULATORY_CARE_PROVIDER_SITE_OTHER): Payer: Medicare Other | Admitting: Family Medicine

## 2016-07-02 VITALS — BP 114/62 | HR 55 | Resp 15 | Ht 70.0 in | Wt 227.0 lb

## 2016-07-02 DIAGNOSIS — F5105 Insomnia due to other mental disorder: Secondary | ICD-10-CM

## 2016-07-02 DIAGNOSIS — F419 Anxiety disorder, unspecified: Secondary | ICD-10-CM

## 2016-07-02 DIAGNOSIS — R0609 Other forms of dyspnea: Secondary | ICD-10-CM | POA: Insufficient documentation

## 2016-07-02 DIAGNOSIS — E785 Hyperlipidemia, unspecified: Secondary | ICD-10-CM | POA: Diagnosis not present

## 2016-07-02 DIAGNOSIS — Z09 Encounter for follow-up examination after completed treatment for conditions other than malignant neoplasm: Secondary | ICD-10-CM | POA: Diagnosis not present

## 2016-07-02 DIAGNOSIS — F418 Other specified anxiety disorders: Secondary | ICD-10-CM | POA: Diagnosis not present

## 2016-07-02 DIAGNOSIS — R0600 Dyspnea, unspecified: Secondary | ICD-10-CM

## 2016-07-02 DIAGNOSIS — R0602 Shortness of breath: Secondary | ICD-10-CM | POA: Insufficient documentation

## 2016-07-02 MED ORDER — AZELASTINE HCL 0.1 % NA SOLN: 2.0000 | Freq: Two times a day (BID) | NASAL | 12 refills | Status: DC

## 2016-07-02 NOTE — Patient Instructions (Addendum)
F/U in August as before, call if you need me sooner  You are referred to lung specialist for further evaluaton , that  office will call will apppointment  AVOID 2nd hand smoke or toxic fumes  Continue all meds , but new is Astelin nose spray to help with allergies also do saline flushes   Please work on good  health habits so that your health will improve. 1. Commitment to daily physical activity for 30 to 60  minutes, if you are able to do this.  2. Commitment to wise food choices. Aim for half of your  food intake to be vegetable and fruit, one quarter starchy foods, and one quarter protein. Try to eat on a regular schedule  3 meals per day, snacking between meals should be limited to vegetables or fruits or small portions of nuts. 64 ounces of water per day is generally recommended, unless you have specific health conditions, like heart failure or kidney failure where you will need to limit fluid intake.  3. Commitment to sufficient and a  good quality of physical and mental rest daily, generally between 6 to 8 hours per day.  WITH PERSISTANCE AND PERSEVERANCE, THE IMPOSSIBLE , BECOMES THE NORM!

## 2016-07-02 NOTE — Assessment & Plan Note (Signed)
X 3 days in March for acute respiratory failure unclear as to underlying lung pathology, refer to pulmonary, using provntil at least twice daily

## 2016-07-03 ENCOUNTER — Other Ambulatory Visit: Payer: Self-pay | Admitting: Family Medicine

## 2016-07-03 NOTE — Telephone Encounter (Signed)
Last vit d level on 3 9 17  = 44  Do you wish to reorder this?

## 2016-07-04 ENCOUNTER — Ambulatory Visit (INDEPENDENT_AMBULATORY_CARE_PROVIDER_SITE_OTHER): Payer: Medicare Other | Admitting: Pulmonary Disease

## 2016-07-04 ENCOUNTER — Encounter: Payer: Self-pay | Admitting: Pulmonary Disease

## 2016-07-04 VITALS — BP 116/68 | HR 60 | Ht 70.0 in | Wt 226.2 lb

## 2016-07-04 DIAGNOSIS — R0602 Shortness of breath: Secondary | ICD-10-CM

## 2016-07-04 NOTE — Patient Instructions (Signed)
We will schedule you for pulmonary function tests. Use albuterol as needed for shortness of breath Return to clinic after PFTs for follow-up.

## 2016-07-04 NOTE — Progress Notes (Signed)
Terry Love    [id number]    22-Dec-1935  Primary Care Physician:Margaret Moshe Cipro, MD  Referring Physician: Fayrene Helper, MD [address] [place], [place] [address]  Chief complaint:  Consult for evaluation of COPD  HPI: 81 year old with past medical history of insomnia, snoring, vitamin D deficiency, hyperlipidemia depression with anxiety. He was admitted in March 2018 with presumed COPD exacerbation treated with IV steroids, antibiotics and nebulizers. He was discharged on prednisone taper which she finished today and doxycycline. He is being maintained on albuterol inhaler but feels that his breathing has improved. He complained does not have any cough, dyspnea, shortness of breath, wheezing, sputum production, fevers, chills. He is lifelong nonsmoker but used to chew tobacco. He worked various jobs including the [employer] He does not report any exposures at work or at home. He has history of insomnia evaluated by a sleep study in February 2017. This was reportedly normal per the patient.  Outpatient Encounter Prescriptions as of 07/04/2016  Medication Sig  . albuterol (PROVENTIL HFA;VENTOLIN HFA) 108 (90 Base) MCG/ACT inhaler Inhale 2 puffs into the lungs every 4 (four) hours as needed for wheezing or shortness of breath (cough, shortness of breath or wheezing.).  Marland Kitchen aspirin (BAYER ASPIRIN EC LOW DOSE) 81 MG EC tablet Take 81 mg by mouth daily.    Marland Kitchen atorvastatin (LIPITOR) 40 MG tablet Take 1 tablet (40 mg total) by mouth at bedtime.  Marland Kitchen azelastine (ASTELIN) 0.1 % nasal spray Place 2 sprays into both nostrils 2 (two) times daily. Use in each nostril as directed  . calcium-vitamin D (OSCAL WITH D) 500-200 MG-UNIT per tablet Take 1 tablet by mouth 3 (three) times daily.   . citalopram (CELEXA) 40 MG tablet Take 1 tablet (40 mg total) by mouth daily.  Marland Kitchen doxepin (SINEQUAN) 25 MG capsule Take 25 mg by mouth at bedtime.  . montelukast (SINGULAIR) 10 MG  tablet TAKE 1 TABLET BY MOUTH AT BEDTIME  . Multiple Vitamin (MULTIVITAMIN) tablet Take 1 tablet by mouth daily. Unspecified frequency  . temazepam (RESTORIL) 15 MG capsule Take 1 capsule (15 mg total) by mouth at bedtime as needed for sleep.  . vitamin C (ASCORBIC ACID) 500 MG tablet Take 500 mg by mouth daily.  . Vitamin D, Ergocalciferol, (DRISDOL) 50000 units CAPS capsule TAKE ONE CAPSULE BY MOUTH A WEEK  . Wheat Dextrin (BENEFIBER DRINK MIX PO) Take by mouth. Use as directed daily  . [DISCONTINUED] loratadine (CLARITIN) 10 MG tablet Take 1 tablet (10 mg total) by mouth daily.  . [DISCONTINUED] predniSONE (DELTASONE) 20 MG tablet Take 3 PO QAM x3days, 2 PO QAM x3days, 1 PO QAM x3days   No facility-administered encounter medications on file as of 07/04/2016.     Allergies as of 07/04/2016  . (No Known Allergies)    Past Medical History:  Diagnosis Date  . Anxiety   . Bradycardia    Chronic   . Colon polyps   . Depression   . Facial tic   . Hearing loss   . Hyperlipidemia   . 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 Obesity   . Seasonal allergies     Past Surgical History:  Procedure Laterality Date  . carpal tunnel  release right  2006  . COLONOSCOPY    . COLONOSCOPY  01/25/2011   Dr. Rehman:Examination performed to cecum Pan colonic diverticulosis/2 small polyps ablated via cold biopsy from transverse colon/External hemorrhoids,  benign polyps  . COLONOSCOPY N/A 06/29/2013   Procedure: COLONOSCOPY;  Surgeon: Daneil Dolin, MD;  [facility];  Service: Endoscopy;  Laterality: N/A;  10:45    Family History  Problem Relation Age of Onset  . Colon cancer Mother     diagnosed at age 9  . Aneurysm Father   . Stroke Brother     Social History   Social History  . Marital status: Married    Spouse name: N/A  . Number of children: N/A  . Years of education: N/A   Occupational History  . Not on file.   Social History Main Topics  .  Smoking status: Never Smoker  . Smokeless tobacco: Former Systems developer    Types: [place] date: 03/13/2016  . Alcohol use 0.0 oz/week     Comment: occasional  . Drug use: No  . Sexual activity: Not on file   Other Topics Concern  . Not on file   Social History Narrative  . No narrative on file   Review of systems: Review of Systems  Constitutional: Negative for fever and chills.  HENT: Negative.   Eyes: Negative for blurred vision.  Respiratory: as per HPI  Cardiovascular: Negative for chest pain and palpitations.  Gastrointestinal: Negative for vomiting, diarrhea, blood per rectum. Genitourinary: Negative for dysuria, urgency, frequency and hematuria.  Musculoskeletal: Negative for myalgias, back pain and joint pain.  Skin: Negative for itching and rash.  Neurological: Negative for dizziness, tremors, focal weakness, seizures and loss of consciousness.  Endo/Heme/Allergies: Negative for environmental allergies.  Psychiatric/Behavioral: Negative for depression, suicidal ideas and hallucinations.  All other systems reviewed and are negative.  Physical Exam: Blood pressure 116/68, pulse 60, height 5\' 10"  (1.778 m), weight 226 lb 3.2 oz (102.6 kg), SpO2 96 %. Gen:      No acute distress HEENT:  EOMI, sclera anicteric Neck:     No masses; no thyromegaly Lungs:    Clear to auscultation bilaterally; normal respiratory effort CV:         Regular rate and rhythm; no murmurs Abd:      + bowel sounds; soft, non-tender; no palpable masses, no distension Ext:    No edema; adequate peripheral perfusion Skin:      Warm and dry; no rash Neuro: alert and oriented x 3 Psych: normal mood and affect  Data Reviewed: Chest x-ray 06/24/16-no acute cardiopulmonary abnormality. I reviewed the images personally.  Assessment:  Consult for evaluation of COPD Trippe has had recent admission for respiratory failure. This is likely secondary to bronchitis, viral infection which improved with steroids  and antibiotics. His chest x-ray was clear at that time. He may have COPD but this suspicion for this is low as he is a lifelong nonsmoker with no known exposures  I'll evaluate by getting pulmonary function tests. He'll continue the albuterol inhaler. I have instructed him to use it as needed and not standing twice a day as he does not appear to have any respiratory complaints at present.  Plan/Recommendations: - PFTs - Use albuterol as needed.  Marshell Garfinkel MD [facility] Pager [phone] 07/04/2016, 11:51 AM  CC: Fayrene Helper, MD

## 2016-07-16 DIAGNOSIS — Z09 Encounter for follow-up examination after completed treatment for conditions other than malignant neoplasm: Secondary | ICD-10-CM | POA: Insufficient documentation

## 2016-07-16 NOTE — Progress Notes (Signed)
   Terry Love     MRN: [id number]      DOB: 11-Jul-1935   HPI Terry Love  Patient in for follow up of recent hospitalization from 03/18 to 06/25/2016 when he presented with a 2 day h/o cough , SOB and wheeze with chest congestion and chills. Final viral culture result is positive for rhinovirus Discharge summary, and laboratory and radiology data are reviewed, and any questions or concerns about recent hospitalization are discussed. Specific issues requiring follow up are specifically addressed. Pt had a normal sleep study one year ago, and an echocardiogram in 2017 showed grade 1 diastolic dysfunction, normal EF and no significant valvular abnormality Reports feeling somewhat better , but still has fatigue and poor exercise tolerance Underlying pathology of his severe repiratory distress not entirely clear, unsure if he does have  a diagnosis of COPD, so is being  referred for formal pulmonary evaluation   ROS See HPI  . Denies sinus pressure, nasal congestion, ear pain or sore throat. . Denies chest pains, palpitations and leg swelling Denies abdominal pain, nausea, vomiting,diarrhea or constipation.   Denies dysuria, frequency, hesitancy or incontinence. Denies joint pain, swelling and limitation in mobility. Denies headaches, seizures, numbness, or tingling. Denies uncontrolled  depression, does have  Anxiety and insomnia. Denies skin break down or rash.   PE  BP 114/62   Pulse (!) 55   Resp 15   Ht 5\' 10"  (1.778 m)   Wt 227 lb (103 kg)   SpO2 94%   BMI 32.57 kg/m   Patient alert and oriented and in no cardiopulmonary distress.  HEENT: No facial asymmetry, EOMI,   oropharynx pink and moist.  Neck supple no JVD, no mass.  Chest: Clear to auscultation bilaterally.  CVS: S1, S2 no murmurs, no S3.Regular rate.  ABD: Soft non tender.   Ext: No edema  MS: Adequate ROM spine, shoulders, hips and knees.  Skin: Intact, no ulcerations or rash noted.  Psych: Good  eye contact, normal affect. Memory intact not anxious or depressed appearing.  CNS: CN 2-12 intact, power,  normal throughout.no focal deficits noted.   Assessment & Plan  Dyspnea, unspecified X 3 days in March for acute respiratory failure unclear as to underlying lung pathology, refer to pulmonary, using provntil at least twice daily  Hospital discharge follow-up Improving post discharge, still experiencing fatigue and poor exercise tolerance , uses albuterol MDI regularly, no formal dx of lung disease. Has had both a sleep study and echocardiogram in past year , no significant pathology noted Needs pulmonary evaluation to determine whether he has underlying lung disease or not   Depression with anxiety On going personal stress  Due to family issues, needs and will benefit from therapy, which he is seeking through the [place]  Insomnia secondary to anxiety Continue current medication, sleep hygiene reviewed  Hyperlipidemia LDL goal <100 Controlled on current medication

## 2016-07-16 NOTE — Assessment & Plan Note (Signed)
Continue current medication, sleep hygiene reviewed

## 2016-07-16 NOTE — Assessment & Plan Note (Signed)
Controlled on current medication ?

## 2016-07-16 NOTE — Assessment & Plan Note (Signed)
On going personal stress  Due to family issues, needs and will benefit from therapy, which he is seeking through the [place]

## 2016-07-16 NOTE — Assessment & Plan Note (Signed)
Improving post discharge, still experiencing fatigue and poor exercise tolerance , uses albuterol MDI regularly, no formal dx of lung disease. Has had both a sleep study and echocardiogram in past year , no significant pathology noted Needs pulmonary evaluation to determine whether he has underlying lung disease or not

## 2016-08-15 DIAGNOSIS — R5383 Other fatigue: Secondary | ICD-10-CM | POA: Diagnosis not present

## 2016-08-15 DIAGNOSIS — R001 Bradycardia, unspecified: Secondary | ICD-10-CM | POA: Diagnosis not present

## 2016-08-15 DIAGNOSIS — H906 Mixed conductive and sensorineural hearing loss, bilateral: Secondary | ICD-10-CM | POA: Diagnosis not present

## 2016-08-15 DIAGNOSIS — F5105 Insomnia due to other mental disorder: Secondary | ICD-10-CM | POA: Diagnosis not present

## 2016-08-15 DIAGNOSIS — R252 Cramp and spasm: Secondary | ICD-10-CM | POA: Diagnosis not present

## 2016-08-15 DIAGNOSIS — E785 Hyperlipidemia, unspecified: Secondary | ICD-10-CM | POA: Diagnosis not present

## 2016-08-15 DIAGNOSIS — F331 Major depressive disorder, recurrent, moderate: Secondary | ICD-10-CM | POA: Diagnosis not present

## 2016-08-24 ENCOUNTER — Encounter: Payer: Self-pay | Admitting: Cardiovascular Disease

## 2016-08-24 ENCOUNTER — Ambulatory Visit (INDEPENDENT_AMBULATORY_CARE_PROVIDER_SITE_OTHER): Payer: Medicare Other | Admitting: Cardiovascular Disease

## 2016-08-24 VITALS — BP 108/62 | HR 55 | Ht 70.0 in | Wt 230.0 lb

## 2016-08-24 DIAGNOSIS — R001 Bradycardia, unspecified: Secondary | ICD-10-CM

## 2016-08-24 DIAGNOSIS — R011 Cardiac murmur, unspecified: Secondary | ICD-10-CM

## 2016-08-24 DIAGNOSIS — E7800 Pure hypercholesterolemia, unspecified: Secondary | ICD-10-CM | POA: Diagnosis not present

## 2016-08-24 DIAGNOSIS — R0609 Other forms of dyspnea: Secondary | ICD-10-CM

## 2016-08-24 DIAGNOSIS — R9431 Abnormal electrocardiogram [ECG] [EKG]: Secondary | ICD-10-CM

## 2016-08-24 DIAGNOSIS — R5383 Other fatigue: Secondary | ICD-10-CM | POA: Diagnosis not present

## 2016-08-24 NOTE — Progress Notes (Signed)
SUBJECTIVE: The patient presents for follow-up of fatigue, bradycardia, and poor exercise tolerance.  Normal nuclear stress test 03/27/16, LVEF 70%.  Event monitoring demonstrated predominantly normal sinus rhythm with occasional sinus bradycardia. The lowest heart rate was in the 50 bpm range. There was one isolated episode of chest pain which correlated with normal sinus rhythm.  Sleep study did not demonstrate any evidence of sleep apnea.  He continues to feel "washed out". He has felt this way for 2 months. He recently saw a pulmonologist and is scheduled for pulmonary function testing on May 30. He has had wheezing and shortness of breath. He denies chest pain. He feels his exertional dyspnea is stable. He also questions whether he has the motivation to do anything. He denies myalgias.  Heart rate and PCPs office on 3/26 was 55 bpm.  On auscultation, heart rate is 50-52 bpm today.   Review of Systems: As per "subjective", otherwise negative.  No Known Allergies  Current Outpatient Prescriptions  Medication Sig Dispense Refill  . albuterol (PROVENTIL HFA;VENTOLIN HFA) 108 (90 Base) MCG/ACT inhaler Inhale 2 puffs into the lungs every 4 (four) hours as needed for wheezing or shortness of breath (cough, shortness of breath or wheezing.). 1 Inhaler 1  . aspirin (BAYER ASPIRIN EC LOW DOSE) 81 MG EC tablet Take 81 mg by mouth daily.      Marland Kitchen atorvastatin (LIPITOR) 40 MG tablet Take 1 tablet (40 mg total) by mouth at bedtime. 90 tablet 1  . azelastine (ASTELIN) 0.1 % nasal spray Place 2 sprays into both nostrils 2 (two) times daily. Use in each nostril as directed 30 mL 12  . calcium-vitamin D (OSCAL WITH D) 500-200 MG-UNIT per tablet Take 1 tablet by mouth 3 (three) times daily.     . citalopram (CELEXA) 40 MG tablet Take 1 tablet (40 mg total) by mouth daily. 30 tablet 3  . doxepin (SINEQUAN) 25 MG capsule Take 25 mg by mouth at bedtime.    . montelukast (SINGULAIR) 10 MG tablet  TAKE 1 TABLET BY MOUTH AT BEDTIME 30 tablet 3  . Multiple Vitamin (MULTIVITAMIN) tablet Take 1 tablet by mouth daily. Unspecified frequency    . SILENOR 6 MG TABS Take 1 tablet by mouth daily.  5  . temazepam (RESTORIL) 15 MG capsule Take 1 capsule (15 mg total) by mouth at bedtime as needed for sleep. 30 capsule 2  . vitamin C (ASCORBIC ACID) 500 MG tablet Take 500 mg by mouth daily.    . Vitamin D, Ergocalciferol, (DRISDOL) 50000 units CAPS capsule TAKE ONE CAPSULE BY MOUTH A WEEK 12 capsule 0  . Wheat Dextrin (BENEFIBER DRINK MIX PO) Take by mouth. Use as directed daily     No current facility-administered medications for this visit.     Past Medical History:  Diagnosis Date  . Anxiety   . Bradycardia    Chronic   . Colon polyps   . Depression   . Facial tic   . Hearing loss   . Hyperlipidemia   . 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 Obesity   . Seasonal allergies     Past Surgical History:  Procedure Laterality Date  . carpal tunnel  release right  2006  . COLONOSCOPY    . COLONOSCOPY  01/25/2011   Dr. Rehman:Examination performed to cecum Pan colonic diverticulosis/2 small polyps ablated via cold biopsy from transverse colon/External hemorrhoids, benign polyps  . COLONOSCOPY N/A 06/29/2013   Procedure:  COLONOSCOPY;  Surgeon: Daneil Dolin, MD;  [facility]: [facility];  Service: Endoscopy;  Laterality: N/A;  10:45    Social History   Social History  . Marital status: Married    Spouse name: N/A  . Number of children: N/A  . Years of education: N/A   Occupational History  . Not on file.   Social History Main Topics  . Smoking status: Never Smoker  . Smokeless tobacco: Former Systems developer    Types: [place] date: 03/13/2016  . Alcohol use 0.0 oz/week     Comment: occasional  . Drug use: No  . Sexual activity: Not on file   Other Topics Concern  . Not on file   Social History Narrative  . No narrative on file     Vitals:   08/24/16  0949  BP: 108/62  Pulse: (!) 55  SpO2: 99%  Weight: 230 lb (104.3 kg)  Height: 5\' 10"  (1.778 m)    Wt Readings from Last 3 Encounters:  08/24/16 230 lb (104.3 kg)  07/04/16 226 lb 3.2 oz (102.6 kg)  07/02/16 227 lb (103 kg)     PHYSICAL EXAM General: NAD HEENT: Normal. Neck: No JVD, no thyromegaly. Lungs: Clear to auscultation bilaterally with normal respiratory effort. CV: Nondisplaced PMI. Bradycardic, regular rhythm, normal S1/S2, no O5/D6, soft systolic murmur over RUSB. No peripheral edema.  Abdomen: Soft, nontender, obese.  Neurologic: Alert and oriented.  Psych: Somewhat flat affect. Skin: Normal. Musculoskeletal: No gross deformities.    ECG: Most recent ECG reviewed.   Labs: Lab Results  Component Value Date/Time   K 3.6 06/24/2016 03:41 AM   BUN 11 06/24/2016 03:41 AM   CREATININE 0.77 06/24/2016 03:41 AM   CREATININE 0.73 02/20/2016 08:53 AM   ALT 18 02/20/2016 08:53 AM   TSH 0.89 06/20/2015 09:32 AM   HGB 13.3 06/24/2016 03:41 AM     Lipids: Lab Results  Component Value Date/Time   LDLCALC 68 02/20/2016 08:53 AM   CHOL 132 02/20/2016 08:53 AM   TRIG 54 02/20/2016 08:53 AM   HDL 53 02/20/2016 08:53 AM       ASSESSMENT AND PLAN: 1. Bradycardia/exertional dyspnea/fatigue/abnormal ECG: Symptoms appear to be stable. Scheduled for pulmonary function testing on May 30. Heart rate and PCPs office on 3/26 was 55 bpm. On auscultation, heart rate is 50-52 bpm today. TSH normal 06/20/15. Not on AV nodal blocking agents.  Normal stress test and unremarkable event monitor results detailed above. Sleep study also unremarkable. Symptoms may be due to cardiovascular deconditioning or a pulmonary etiology. I will continue to monitor his heart rate to evaluate for symptomatically bradycardia, although a heart rate in the 50 beat per minute range should not be causing his symptoms.  2. Hyperlipidemia:Lipids 02/20/16 total cholesterol 132, trig glycerides 54,  HDL 53, LDL 68. Continue statin therapy.    Disposition: Follow up 3 months  Kate Sable, M.D., F.A.C.C.

## 2016-08-24 NOTE — Patient Instructions (Signed)
Your physician recommends that you schedule a follow-up appointment in: 3 months with Dr Koneswaran   Your physician recommends that you continue on your current medications as directed. Please refer to the Current Medication list given to you today.     If you need a refill on your cardiac medications before your next appointment, please call your pharmacy.      Thank you for choosing [facility] !         

## 2016-08-25 DIAGNOSIS — Z79899 Other long term (current) drug therapy: Secondary | ICD-10-CM | POA: Diagnosis not present

## 2016-08-25 DIAGNOSIS — E669 Obesity, unspecified: Secondary | ICD-10-CM | POA: Diagnosis not present

## 2016-08-25 DIAGNOSIS — E785 Hyperlipidemia, unspecified: Secondary | ICD-10-CM | POA: Diagnosis not present

## 2016-08-25 DIAGNOSIS — Z125 Encounter for screening for malignant neoplasm of prostate: Secondary | ICD-10-CM | POA: Diagnosis not present

## 2016-08-26 LAB — COMPLETE METABOLIC PANEL WITH GFR
ALBUMIN: 4.2 g/dL (ref 3.6–5.1)
ALK PHOS: 47 U/L (ref 40–115)
ALT: 17 U/L (ref 9–46)
AST: 24 U/L (ref 10–35)
BILIRUBIN TOTAL: 0.7 mg/dL (ref 0.2–1.2)
BUN: 12 mg/dL (ref 7–25)
CO2: 23 mmol/L (ref 20–31)
Calcium: 9.3 mg/dL (ref 8.6–10.3)
Chloride: 104 mmol/L (ref 98–110)
Creat: 0.68 mg/dL — ABNORMAL LOW (ref 0.70–1.18)
Glucose, Bld: 98 mg/dL (ref 65–99)
Potassium: 4.4 mmol/L (ref 3.5–5.3)
Sodium: 139 mmol/L (ref 135–146)
TOTAL PROTEIN: 7 g/dL (ref 6.1–8.1)

## 2016-08-26 LAB — LIPID PANEL
CHOLESTEROL: 145 mg/dL (ref <200)
HDL: 51 mg/dL (ref >40)
LDL Cholesterol: 80 mg/dL (ref <100)
TRIGLYCERIDES: 70 mg/dL (ref <150)
Total CHOL/HDL Ratio: 2.8 Ratio (ref <5.0)
VLDL: 14 mg/dL (ref <30)

## 2016-08-26 LAB — PSA: PSA: 0.1 ng/mL (ref <=4.0)

## 2016-08-26 LAB — TSH: TSH: 1.31 m[IU]/L (ref 0.40–4.50)

## 2016-08-27 ENCOUNTER — Ambulatory Visit (INDEPENDENT_AMBULATORY_CARE_PROVIDER_SITE_OTHER): Payer: Medicare Other

## 2016-08-27 ENCOUNTER — Telehealth: Payer: Self-pay

## 2016-08-27 VITALS — BP 138/78 | HR 53 | Ht 70.0 in | Wt 232.1 lb

## 2016-08-27 DIAGNOSIS — Z0000 Encounter for general adult medical examination without abnormal findings: Secondary | ICD-10-CM | POA: Diagnosis not present

## 2016-08-27 NOTE — Progress Notes (Signed)
Subjective:   Terry Love is a 81 y.o. male who presents for Medicare Annual/Subsequent preventive examination.  Review of Systems:  Patient c/o SOB and wheezing. Expiratory wheezing noted in bilateral upper lobes upon auscultation. Patient has an appointment with Dr. Tammi Klippel on 09/05/2016. Patient's wife will call their office to see if he can be seen sooner. Cardiac Risk Factors include: advanced age (>50men, >29 women);obesity (BMI >30kg/m2);dyslipidemia;male gender;sedentary lifestyle     Objective:    Vitals: BP 138/78   Pulse (!) 53   Ht 5\' 10"  (1.778 m)   Wt 232 lb 1.9 oz (105.3 kg)   SpO2 93%   BMI 33.31 kg/m   Body mass index is 33.31 kg/m.  Tobacco History  Smoking Status  . Never Smoker  Smokeless Tobacco  . Former Systems developer  . Types: Chew  . Quit date: 03/13/2016     Counseling given: Not Answered   Past Medical History:  Diagnosis Date  . Anxiety   . Bradycardia    Chronic   . Colon polyps   . Depression   . Facial tic   . Hearing loss   . Hyperlipidemia   . 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 Obesity   . Seasonal allergies    Past Surgical History:  Procedure Laterality Date  . carpal tunnel  release right  2006  . COLONOSCOPY    . COLONOSCOPY  01/25/2011   Dr. Rehman:Examination performed to cecum Pan colonic diverticulosis/2 small polyps ablated via cold biopsy from transverse colon/External hemorrhoids, benign polyps  . COLONOSCOPY N/A 06/29/2013   Procedure: COLONOSCOPY;  Surgeon: Daneil Dolin, MD;  [facility];  Service: Endoscopy;  Laterality: N/A;  10:45   Family History  Problem Relation Age of Onset  . Colon cancer Mother        diagnosed at age 30  . Aneurysm Father   . Aneurysm Brother   . Stroke Brother    History  Sexual Activity  . Sexual activity: No    Outpatient Encounter Prescriptions as of 08/27/2016  Medication Sig  . albuterol (PROVENTIL HFA;VENTOLIN HFA) 108 (90 Base) MCG/ACT  inhaler Inhale 2 puffs into the lungs every 4 (four) hours as needed for wheezing or shortness of breath (cough, shortness of breath or wheezing.).  Marland Kitchen aspirin (BAYER ASPIRIN EC LOW DOSE) 81 MG EC tablet Take 81 mg by mouth daily.    Marland Kitchen atorvastatin (LIPITOR) 40 MG tablet Take 1 tablet (40 mg total) by mouth at bedtime.  Marland Kitchen azelastine (ASTELIN) 0.1 % nasal spray Place 2 sprays into both nostrils 2 (two) times daily. Use in each nostril as directed  . calcium-vitamin D (OSCAL WITH D) 500-200 MG-UNIT per tablet Take 1 tablet by mouth 2 (two) times daily.   . citalopram (CELEXA) 40 MG tablet Take 1 tablet (40 mg total) by mouth daily.  Marland Kitchen doxepin (SINEQUAN) 25 MG capsule Take 25 mg by mouth at bedtime.  . montelukast (SINGULAIR) 10 MG tablet TAKE 1 TABLET BY MOUTH AT BEDTIME  . Multiple Vitamin (MULTIVITAMIN) tablet Take 1 tablet by mouth daily. Unspecified frequency  . SILENOR 6 MG TABS Take 1 tablet by mouth daily.  . temazepam (RESTORIL) 15 MG capsule Take 1 capsule (15 mg total) by mouth at bedtime as needed for sleep.  . vitamin C (ASCORBIC ACID) 500 MG tablet Take 500 mg by mouth daily.  . Vitamin D, Ergocalciferol, (DRISDOL) 50000 units CAPS capsule TAKE ONE CAPSULE BY MOUTH A  WEEK  . Wheat Dextrin (BENEFIBER DRINK MIX PO) Take by mouth. Use as directed daily   No facility-administered encounter medications on file as of 08/27/2016.     Activities of Daily Living In your present state of health, do you have any difficulty performing the following activities: 08/27/2016 06/24/2016  Hearing? Tempie Donning  Vision? N N  Difficulty concentrating or making decisions? N N  Walking or climbing stairs? N N  Dressing or bathing? N N  Doing errands, shopping? N N  Preparing Food and eating ? N -  Using the Toilet? N -  In the past six months, have you accidently leaked urine? N -  Do you have problems with loss of bowel control? N -  Managing your Medications? N -  Managing your Finances? N -  Housekeeping  or managing your Housekeeping? N -  Some recent data might be hidden    Patient Care Team: Fayrene Helper, MD as PCP - General Rourk, Cristopher Estimable, MD as Consulting Physician (Gastroenterology)   Assessment:    Exercise Activities and Dietary recommendations Current Exercise Habits: The patient does not participate in regular exercise at present, Exercise limited by: None identified  Goals    . Exercise 3x per week (30 min per time)          Recommend starting a routine exercise program at least 3 days a week for 30-45 minutes at a time as tolerated.        Fall Risk Fall Risk  08/27/2016 06/04/2016 04/07/2015 06/23/2014 02/03/2013  Falls in the past year? No No No No No   Depression Screen PHQ 2/9 Scores 08/27/2016 02/20/2016 06/16/2015 04/07/2015  PHQ - 2 Score 1 4 2 4   PHQ- 9 Score - 23 8 15     Cognitive Function: Normal   6CIT Screen 08/27/2016  What Year? 0 points  What month? 0 points  What time? 0 points  Count back from 20 0 points  Months in reverse 0 points  Repeat phrase 0 points  Total Score 0    Immunization History  Administered Date(s) Administered  . Influenza Split 01/01/2013, 01/01/2014  . Influenza Whole 01/06/2007, 01/04/2009, 12/23/2009, 12/14/2010  . Influenza,inj,Quad PF,36+ Mos 12/14/2015  . Pneumococcal Conjugate-13 10/13/2013  . Pneumococcal Polysaccharide-23 02/21/2004  . Td 12/23/2009  . Tdap 02/20/2014  . Zoster 01/06/2007   Screening Tests Health Maintenance  Topic Date Due  . INFLUENZA VACCINE  11/07/2016  . TETANUS/TDAP  02/21/2024  . PNA vac Low Risk Adult  Completed      Plan:   I have personally reviewed and noted the following in the patient's chart:   . Medical and social history . Use of alcohol, tobacco or illicit drugs  . Current medications and supplements . Functional ability and status . Nutritional status . Physical activity . Advanced directives . List of other physicians . Hospitalizations, surgeries, and  ER visits in previous 12 months . Vitals . Screenings to include cognitive, depression, and falls . Referrals and appointments  In addition, I have reviewed and discussed with patient certain preventive protocols, quality metrics, and best practice recommendations. A written personalized care plan for preventive services as well as general preventive health recommendations were provided to patient.     Stormy Fabian, LPN  [id number]

## 2016-08-27 NOTE — Patient Instructions (Addendum)
Terry Love , Thank you for taking time to come for your Medicare Wellness Visit. I appreciate your ongoing commitment to your health goals. Please review the following plan we discussed and let me know if I can assist you in the future.   Screening recommendations/referrals: Colonoscopy: Up to date, next due 06/2020 Recommended yearly ophthalmology/optometry visit for glaucoma screening and checkup Recommended yearly dental visit for hygiene and checkup  Vaccinations: Influenza vaccine: Up to date, next due 11/2016 Pneumococcal vaccine: Up to date Tdap vaccine: Up to date, next due 02/2024 Shingles vaccine: Done    Advanced directives: Please bring a copy of your POA (Power of [place]) and/or Living Will to your next appointment.   Conditions/risks identified: Obese, recommend starting a routine exercise program at least 3 days a week for 30-45 minutes at a time as tolerated.   Next appointment: Follow up with Dr. Moshe Cipro on 12/03/2016 at 11:00 am. Follow up in 1 year for your annual wellness visit.    Preventive Care 79 Years and Older, Male Preventive care refers to lifestyle choices and visits with your health care provider that can promote health and wellness. What does preventive care include?  A yearly physical exam. This is also called an annual well check.  Dental exams once or twice a year.  Routine eye exams. Ask your health care provider how often you should have your eyes checked.  Personal lifestyle choices, including:  Daily care of your teeth and gums.  Regular physical activity.  Eating a healthy diet.  Avoiding tobacco and drug use.  Limiting alcohol use.  Practicing safe sex.  Taking low doses of aspirin every day.  Taking vitamin and mineral supplements as recommended by your health care provider. What happens during an annual well check? The services and screenings done by your health care provider during your annual well check will depend on your  age, overall health, lifestyle risk factors, and family history of disease. Counseling  Your health care provider may ask you questions about your:  Alcohol use.  Tobacco use.  Drug use.  Emotional well-being.  Home and relationship well-being.  Sexual activity.  Eating habits.  History of falls.  Memory and ability to understand (cognition).  Work and work Statistician. Screening  You may have the following tests or measurements:  Height, weight, and BMI.  Blood pressure.  Lipid and cholesterol levels. These may be checked every 5 years, or more frequently if you are over 79 years old.  Skin check.  Lung cancer screening. You may have this screening every year starting at age 79 if you have a 30-pack-year history of smoking and currently smoke or have quit within the past 15 years.  Fecal occult blood test (FOBT) of the stool. You may have this test every year starting at age 79.  Flexible sigmoidoscopy or colonoscopy. You may have a sigmoidoscopy every 5 years or a colonoscopy every 10 years starting at age 79.  Prostate cancer screening. Recommendations will vary depending on your family history and other risks.  Hepatitis C blood test.  Hepatitis B blood test.  Sexually transmitted disease (STD) testing.  Diabetes screening. This is done by checking your blood sugar (glucose) after you have not eaten for a while (fasting). You may have this done every 1-3 years.  Abdominal aortic aneurysm (AAA) screening. You may need this if you are a current or former smoker.  Osteoporosis. You may be screened starting at age 79 if you are at high risk.  Talk with your health care provider about your test results, treatment options, and if necessary, the need for more tests. Vaccines  Your health care provider may recommend certain vaccines, such as:  Influenza vaccine. This is recommended every year.  Tetanus, diphtheria, and acellular pertussis (Tdap, Td) vaccine. You  may need a Td booster every 10 years.  Zoster vaccine. You may need this after age 79.  Pneumococcal 13-valent conjugate (PCV13) vaccine. One dose is recommended after age 79.  Pneumococcal polysaccharide (PPSV23) vaccine. One dose is recommended after age 79. Talk to your health care provider about which screenings and vaccines you need and how often you need them. This information is not intended to replace advice given to you by your health care provider. Make sure you discuss any questions you have with your health care provider. Document Released: 04/22/2015 Document Revised: 12/14/2015 Document Reviewed: 01/25/2015 Elsevier Interactive Patient Education  2017 [place] Prevention in the Home Falls can cause injuries. They can happen to people of all ages. There are many things you can do to make your home safe and to help prevent falls. What can I do on the outside of my home?  Regularly fix the edges of walkways and driveways and fix any cracks.  Remove anything that might make you trip as you walk through a door, such as a raised step or threshold.  Trim any bushes or trees on the path to your home.  Use bright outdoor lighting.  Clear any walking paths of anything that might make someone trip, such as rocks or tools.  Regularly check to see if handrails are loose or broken. Make sure that both sides of any steps have handrails.  Any raised decks and porches should have guardrails on the edges.  Have any leaves, snow, or ice cleared regularly.  Use sand or salt on walking paths during winter.  Clean up any spills in your garage right away. This includes oil or grease spills. What can I do in the bathroom?  Use night lights.  Install grab bars by the toilet and in the tub and shower. Do not use towel bars as grab bars.  Use non-skid mats or decals in the tub or shower.  If you need to sit down in the shower, use a plastic, non-slip stool.  Keep the floor  dry. Clean up any water that spills on the floor as soon as it happens.  Remove soap buildup in the tub or shower regularly.  Attach bath mats securely with double-sided non-slip rug tape.  Do not have throw rugs and other things on the floor that can make you trip. What can I do in the bedroom?  Use night lights.  Make sure that you have a light by your bed that is easy to reach.  Do not use any sheets or blankets that are too big for your bed. They should not hang down onto the floor.  Have a firm chair that has side arms. You can use this for support while you get dressed.  Do not have throw rugs and other things on the floor that can make you trip. What can I do in the kitchen?  Clean up any spills right away.  Avoid walking on wet floors.  Keep items that you use a lot in easy-to-reach places.  If you need to reach something above you, use a strong step stool that has a grab bar.  Keep electrical cords out of the way.  Do not use floor polish or wax that makes floors slippery. If you must use wax, use non-skid floor wax.  Do not have throw rugs and other things on the floor that can make you trip. What can I do with my stairs?  Do not leave any items on the stairs.  Make sure that there are handrails on both sides of the stairs and use them. Fix handrails that are broken or loose. Make sure that handrails are as long as the stairways.  Check any carpeting to make sure that it is firmly attached to the stairs. Fix any carpet that is loose or worn.  Avoid having throw rugs at the top or bottom of the stairs. If you do have throw rugs, attach them to the floor with carpet tape.  Make sure that you have a light switch at the top of the stairs and the bottom of the stairs. If you do not have them, ask someone to add them for you. What else can I do to help prevent falls?  Wear shoes that:  Do not have high heels.  Have rubber bottoms.  Are comfortable and fit you  well.  Are closed at the toe. Do not wear sandals.  If you use a stepladder:  Make sure that it is fully opened. Do not climb a closed stepladder.  Make sure that both sides of the stepladder are locked into place.  Ask someone to hold it for you, if possible.  Clearly mark and make sure that you can see:  Any grab bars or handrails.  First and last steps.  Where the edge of each step is.  Use tools that help you move around (mobility aids) if they are needed. These include:  Canes.  Walkers.  Scooters.  Crutches.  Turn on the lights when you go into a dark area. Replace any light bulbs as soon as they burn out.  Set up your furniture so you have a clear path. Avoid moving your furniture around.  If any of your floors are uneven, fix them.  If there are any pets around you, be aware of where they are.  Review your medicines with your doctor. Some medicines can make you feel dizzy. This can increase your chance of falling. Ask your doctor what other things that you can do to help prevent falls. This information is not intended to replace advice given to you by your health care provider. Make sure you discuss any questions you have with your health care provider. Document Released: 01/20/2009 Document Revised: 09/01/2015 Document Reviewed: 04/30/2014 Elsevier Interactive Patient Education  2017 [employer].

## 2016-08-27 NOTE — Telephone Encounter (Signed)
Pt looking for an appt this week with dr Moshe Cipro for c/o SOB

## 2016-08-28 ENCOUNTER — Ambulatory Visit (INDEPENDENT_AMBULATORY_CARE_PROVIDER_SITE_OTHER): Payer: Medicare Other | Admitting: Family Medicine

## 2016-08-28 ENCOUNTER — Encounter: Payer: Self-pay | Admitting: Family Medicine

## 2016-08-28 VITALS — BP 104/74 | HR 59 | Resp 16 | Ht 70.0 in | Wt 232.8 lb

## 2016-08-28 DIAGNOSIS — R062 Wheezing: Secondary | ICD-10-CM

## 2016-08-28 DIAGNOSIS — E785 Hyperlipidemia, unspecified: Secondary | ICD-10-CM | POA: Diagnosis not present

## 2016-08-28 MED ORDER — PREDNISONE 5 MG (21) PO TBPK: 5.0000 mg | ORAL_TABLET | ORAL | 0 refills | Status: DC

## 2016-08-28 NOTE — Telephone Encounter (Signed)
Work in this am

## 2016-08-28 NOTE — Assessment & Plan Note (Addendum)
Worse in past 1 week, no fever, chills or sputum, depomedrol in office followed by prednisone dose pack

## 2016-08-28 NOTE — Patient Instructions (Signed)
F/u as before, call if you need me before  Depo medrol I office today and I have sent  In prednisone for 6 days to help with breathing

## 2016-08-29 DIAGNOSIS — R062 Wheezing: Secondary | ICD-10-CM | POA: Diagnosis not present

## 2016-08-29 MED ORDER — METHYLPREDNISOLONE ACETATE 80 MG/ML IJ SUSP
80.0000 mg | Freq: Once | INTRAMUSCULAR | Status: AC
  Administered 2016-08-29: 80 mg via INTRAMUSCULAR

## 2016-08-29 NOTE — Assessment & Plan Note (Signed)
Hyperlipidemia:Low fat diet discussed and encouraged.   Lipid Panel  Lab Results  Component Value Date   CHOL 145 08/25/2016   HDL 51 08/25/2016   LDLCALC 80 08/25/2016   TRIG 70 08/25/2016   CHOLHDL 2.8 08/25/2016   Controlled, no change in medication   

## 2016-08-29 NOTE — Addendum Note (Signed)
Addended by: Eual Fines on: 08/29/2016 09:26 AM   Modules accepted: Orders

## 2016-08-29 NOTE — Progress Notes (Signed)
   Terry Love     MRN: [id number]      DOB: 1935/04/15   HPI Terry Love is here c/o increased wheezing , cough and shortness of breath, especially at night, spouse is present and supports the history, states he is needing the rescue inhaler often Denies fever, chills or sputum Has PFT's and pulmonary follow up in the near future ROS Denies recent fever or chills. Denies sinus pressure, nasal congestion, ear pain or sore throat.  Denies chest pains, palpitations and leg swelling Denies abdominal pain, nausea, vomiting,diarrhea or constipation.   Denies dysuria, frequency, hesitancy or incontinence. Denies joint pain, swelling and limitation in mobility. Denies headaches, seizures, numbness, or tingling. Denies depression, anxiety or insomnia. Denies skin break down or rash.   PE  BP 104/74   Pulse (!) 59   Resp 16   Ht 5\' 10"  (1.778 m)   Wt 232 lb 12.8 oz (105.6 kg)   SpO2 95%   BMI 33.40 kg/m   Patient alert and oriented and in no cardiopulmonary distress.  HEENT: No facial asymmetry, EOMI,   oropharynx pink and moist.  Neck supple no JVD, no mass.  Chest: Decreased air entry wih scattered wheezes  CVS: S1, S2 no murmurs, no S3.Regular rate.  ABD: Soft non tender.   Ext: No edema  MS: Adequate ROM spine, shoulders, hips and knees.  Skin: Intact, no ulcerations or rash noted.  Psych: Good eye contact, normal affect. Memory intact not anxious or depressed appearing.  CNS: CN 2-12 intact, power,  normal throughout.no focal deficits noted.   Assessment & Plan  Wheezing Worse in past 1 week, no fever, chills or sputum, depomedrol in office followed by prednisone dose pack  Hyperlipidemia LDL goal <100 Hyperlipidemia:Low fat diet discussed and encouraged.   Lipid Panel  Lab Results  Component Value Date   CHOL 145 08/25/2016   HDL 51 08/25/2016   LDLCALC 80 08/25/2016   TRIG 70 08/25/2016   CHOLHDL 2.8 08/25/2016     Controlled, no change in  medication

## 2016-09-05 ENCOUNTER — Ambulatory Visit (INDEPENDENT_AMBULATORY_CARE_PROVIDER_SITE_OTHER): Payer: Medicare Other | Admitting: Pulmonary Disease

## 2016-09-05 ENCOUNTER — Other Ambulatory Visit (INDEPENDENT_AMBULATORY_CARE_PROVIDER_SITE_OTHER): Payer: Medicare Other

## 2016-09-05 ENCOUNTER — Encounter: Payer: Self-pay | Admitting: Pulmonary Disease

## 2016-09-05 VITALS — BP 120/62 | HR 49 | Ht 70.0 in | Wt 229.0 lb

## 2016-09-05 DIAGNOSIS — R0602 Shortness of breath: Secondary | ICD-10-CM

## 2016-09-05 LAB — CBC WITH DIFFERENTIAL/PLATELET
BASOS ABS: 0.1 10*3/uL (ref 0.0–0.1)
Basophils Relative: 1.6 % (ref 0.0–3.0)
EOS ABS: 0.4 10*3/uL (ref 0.0–0.7)
Eosinophils Relative: 5.4 % — ABNORMAL HIGH (ref 0.0–5.0)
HCT: 42.2 % (ref 39.0–52.0)
Hemoglobin: 13.7 g/dL (ref 13.0–17.0)
LYMPHS ABS: 2.8 10*3/uL (ref 0.7–4.0)
LYMPHS PCT: 34.4 % (ref 12.0–46.0)
MCHC: 32.6 g/dL (ref 30.0–36.0)
MCV: 93.1 fl (ref 78.0–100.0)
MONO ABS: 0.7 10*3/uL (ref 0.1–1.0)
Monocytes Relative: 9 % (ref 3.0–12.0)
NEUTROS ABS: 4.1 10*3/uL (ref 1.4–7.7)
NEUTROS PCT: 49.6 % (ref 43.0–77.0)
PLATELETS: 311 10*3/uL (ref 150.0–400.0)
RBC: 4.53 Mil/uL (ref 4.22–5.81)
RDW: 13.1 % (ref 11.5–15.5)
WBC: 8.2 10*3/uL (ref 4.0–10.5)

## 2016-09-05 LAB — PULMONARY FUNCTION TEST
DL/VA % pred: 100 %
DL/VA: 4.52 ml/min/mmHg/L
DLCO COR % PRED: 79 %
DLCO UNC: 23.54 ml/min/mmHg
DLCO cor: 23.96 ml/min/mmHg
DLCO unc % pred: 77 %
FEF 25-75 POST: 2.53 L/s
FEF 25-75 Pre: 1.97 L/sec
FEF2575-%Change-Post: 27 %
FEF2575-%Pred-Post: 129 %
FEF2575-%Pred-Pre: 101 %
FEV1-%Change-Post: 5 %
FEV1-%PRED-POST: 92 %
FEV1-%PRED-PRE: 87 %
FEV1-POST: 2.32 L
FEV1-PRE: 2.2 L
FEV1FVC-%Change-Post: 0 %
FEV1FVC-%PRED-PRE: 106 %
FEV6-%Change-Post: 5 %
FEV6-%PRED-POST: 88 %
FEV6-%Pred-Pre: 84 %
FEV6-POST: 2.88 L
FEV6-Pre: 2.74 L
FEV6FVC-%CHANGE-POST: 0 %
FEV6FVC-%PRED-POST: 105 %
FEV6FVC-%Pred-Pre: 105 %
FVC-%CHANGE-POST: 4 %
FVC-%Pred-Post: 84 %
FVC-%Pred-Pre: 80 %
FVC-Post: 2.9 L
FVC-Pre: 2.76 L
POST FEV6/FVC RATIO: 99 %
PRE FEV6/FVC RATIO: 99 %
Post FEV1/FVC ratio: 80 %
Pre FEV1/FVC ratio: 80 %
RV % pred: 100 %
RV: 2.59 L
TLC % PRED: 82 %
TLC: 5.57 L

## 2016-09-05 LAB — NITRIC OXIDE: Nitric Oxide: 54

## 2016-09-05 NOTE — Progress Notes (Signed)
PFT done today. 

## 2016-09-05 NOTE — Patient Instructions (Addendum)
We will check blood tests including CBC differential, blood allergy profile, We will start you on Symbicort inhaler 160/4.5. Please take 2 puffs in the morning and 2 puffs at night Continue using your albuterol inhaler as needed  Return to clinic in 3 months to assess response to therapy.

## 2016-09-05 NOTE — Progress Notes (Signed)
Terry Love    [id number]    07/15/1935  Primary Care Physician:Simpson, Norwood Levo, MD  Referring Physician: Fayrene Helper, MD [address], [place], [place] [address]  Chief complaint:  Follow-up for dyspnea  HPI: 81 year old with past medical history of insomnia, snoring, vitamin D deficiency, hyperlipidemia depression with anxiety. He was admitted in March 2018 with presumed COPD exacerbation treated with IV steroids, antibiotics and nebulizers. He was discharged on prednisone taper which he finished today and doxycycline. He is being maintained on albuterol inhaler but feels that his breathing has improved. He complained does not have any cough, dyspnea, shortness of breath, wheezing, sputum production, fevers, chills. He is lifelong nonsmoker but used to chew tobacco. He worked various jobs including the [employer] He does not report any exposures at work or at home. He has history of insomnia evaluated by a sleep study in February 2017. This was reportedly normal per the patient.  Interim history: He was seen by his primary care physician earlier this month for increased wheezing, shortness of breath. He was given a prednisone taper which has improved his symptoms. He continues on albuterol rescue inhaler.  Outpatient Encounter Prescriptions as of 09/05/2016  Medication Sig  . albuterol (PROVENTIL HFA;VENTOLIN HFA) 108 (90 Base) MCG/ACT inhaler Inhale 2 puffs into the lungs every 4 (four) hours as needed for wheezing or shortness of breath (cough, shortness of breath or wheezing.).  Marland Kitchen aspirin (BAYER ASPIRIN EC LOW DOSE) 81 MG EC tablet Take 81 mg by mouth daily.    Marland Kitchen atorvastatin (LIPITOR) 40 MG tablet Take 1 tablet (40 mg total) by mouth at bedtime.  Marland Kitchen azelastine (ASTELIN) 0.1 % nasal spray Place 2 sprays into both nostrils 2 (two) times daily. Use in each nostril as directed  . calcium-vitamin D (OSCAL WITH D) 500-200 MG-UNIT per tablet Take  1 tablet by mouth 2 (two) times daily.   . citalopram (CELEXA) 40 MG tablet Take 1 tablet (40 mg total) by mouth daily.  . montelukast (SINGULAIR) 10 MG tablet TAKE 1 TABLET BY MOUTH AT BEDTIME  . Multiple Vitamin (MULTIVITAMIN) tablet Take 1 tablet by mouth daily. Unspecified frequency  . predniSONE (STERAPRED UNI-PAK 21 TAB) 5 MG (21) TBPK tablet Take 1 tablet (5 mg total) by mouth as directed. Use as directed  . SILENOR 6 MG TABS Take 1 tablet by mouth daily.  . vitamin C (ASCORBIC ACID) 500 MG tablet Take 500 mg by mouth daily.  . Vitamin D, Ergocalciferol, (DRISDOL) 50000 units CAPS capsule TAKE ONE CAPSULE BY MOUTH A WEEK  . Wheat Dextrin (BENEFIBER DRINK MIX PO) Take by mouth. Use as directed daily  . [DISCONTINUED] doxepin (SINEQUAN) 25 MG capsule Take 25 mg by mouth at bedtime.  . [DISCONTINUED] temazepam (RESTORIL) 15 MG capsule Take 1 capsule (15 mg total) by mouth at bedtime as needed for sleep.   No facility-administered encounter medications on file as of 09/05/2016.     Allergies as of 09/05/2016  . (No Known Allergies)    Past Medical History:  Diagnosis Date  . Anxiety   . Bradycardia    Chronic   . Colon polyps   . Depression   . Facial tic   . Hearing loss   . Hyperlipidemia   . 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 Obesity   . Seasonal allergies     Past Surgical History:  Procedure Laterality Date  .  carpal tunnel  release right  2006  . COLONOSCOPY    . COLONOSCOPY  01/25/2011   Dr. Rehman:Examination performed to cecum Pan colonic diverticulosis/2 small polyps ablated via cold biopsy from transverse colon/External hemorrhoids, benign polyps  . COLONOSCOPY N/A 06/29/2013   Procedure: COLONOSCOPY;  Surgeon: Daneil Dolin, MD;  Location: [facility];  Service: Endoscopy;  Laterality: N/A;  10:45    Family History  Problem Relation Age of Onset  . Colon cancer Mother        diagnosed at age 23  . Aneurysm Father   . Aneurysm  Brother   . Stroke Brother     Social History   Social History  . Marital status: Married    Spouse name: N/A  . Number of children: N/A  . Years of education: N/A   Occupational History  . Not on file.   Social History Main Topics  . Smoking status: Never Smoker  . Smokeless tobacco: Former Systems developer    Types: [place] date: 03/13/2016  . Alcohol use No  . Drug use: No  . Sexual activity: No   Other Topics Concern  . Not on file   Social History Narrative  . No narrative on file   Review of systems: Review of Systems  Constitutional: Negative for fever and chills.  HENT: Negative.   Eyes: Negative for blurred vision.  Respiratory: as per HPI  Cardiovascular: Negative for chest pain and palpitations.  Gastrointestinal: Negative for vomiting, diarrhea, blood per rectum. Genitourinary: Negative for dysuria, urgency, frequency and hematuria.  Musculoskeletal: Negative for myalgias, back pain and joint pain.  Skin: Negative for itching and rash.  Neurological: Negative for dizziness, tremors, focal weakness, seizures and loss of consciousness.  Endo/Heme/Allergies: Negative for environmental allergies.  Psychiatric/Behavioral: Negative for depression, suicidal ideas and hallucinations.  All other systems reviewed and are negative.  Physical Exam: Blood pressure 120/62, pulse (!) 49, height 5\' 10"  (1.778 m), weight 229 lb (103.9 kg), SpO2 94 %. Gen:      No acute distress HEENT:  EOMI, sclera anicteric Neck:     No masses; no thyromegaly Lungs:    Clear to auscultation bilaterally; normal respiratory effort CV:         Regular rate and rhythm; no murmurs Abd:      + bowel sounds; soft, non-tender; no palpable masses, no distension Ext:    No edema; adequate peripheral perfusion Skin:      Warm and dry; no rash Neuro: alert and oriented x 3 Psych: normal mood and affect  Data Reviewed: Chest x-ray 06/24/16-no acute cardiopulmonary abnormality. I reviewed the images  personally.  Echo 06/23/15-  - Mild LVH with LVEF 60-65%. Grade 1 diastolic dysfunction with   increased LV filling pressure. Mild to moderate left atrial   enlargement. Mildly ectatic aortic root. Sclerotic aortic valve   without stenosis. Overall mild aortic regurgitation made up of 2   small jets. Upper normal right atrial chamber size. Trivial   tricuspid regurgitation, unable to assess PASP.  PFTs 09/05/16 FVC 2.90 (84%) FEV1 2.32 (92%) F/F 80 TLC 82% DLCO 79% Minimal reduction in diffusion capacity.  FENO 09/05/16- 54  Assessment:  Consult for evaluation of dyspnea. I had reviewed his PFTs which did not show any obstruction. There is some suggestion of small airway disease as he has improvement in his medical records. He does not have emphysema. I suspect he has asthma given high FENO, recurrent attacks of wheezing  which improved with prednisone. I will evaluate this with CBC with differential, blood allergy profile.  We will start him on symbicort and continue albuterol PRN  Plan/Recommendations: - Start symbicort 160/4.5 - Continue albuterol PRN - Check CBC with diff, blood allergy profile  Marshell Garfinkel MD [facility] Pager [phone] 09/05/2016, 12:29 PM  CC: Fayrene Helper, MD

## 2016-09-06 ENCOUNTER — Other Ambulatory Visit: Payer: Self-pay

## 2016-09-06 LAB — RESPIRATORY ALLERGY PROFILE REGION II ~~LOC~~
Allergen, A. alternata, m6: <0.1 kU/L
Allergen, Cedar tree, t12: <0.1 kU/L
Allergen, Comm Silver Birch, t9: <0.1 kU/L
Allergen, D pternoyssinus,d7: <0.1 kU/L
Allergen, Oak,t7: <0.1 kU/L
Box Elder IgE: <0.1 kU/L
Cat Dander: <0.1 kU/L
D. farinae: <0.1 kU/L
Dog Dander: <0.1 kU/L
Elm IgE: <0.1 kU/L
IgE (Immunoglobulin E), Serum: 150 kU/L — ABNORMAL HIGH (ref <115)
Johnson Grass: <0.1 kU/L
Rough Pigweed  IgE: <0.1 kU/L
Sheep Sorrel IgE: <0.1 kU/L

## 2016-09-25 ENCOUNTER — Other Ambulatory Visit: Payer: Self-pay | Admitting: Family Medicine

## 2016-10-01 ENCOUNTER — Telehealth: Payer: Self-pay | Admitting: Family Medicine

## 2016-10-01 ENCOUNTER — Other Ambulatory Visit: Payer: Self-pay

## 2016-10-01 MED ORDER — CALCIUM CARBONATE-VITAMIN D 500-200 MG-UNIT PO TABS: 1.0000 | ORAL_TABLET | Freq: Two times a day (BID) | ORAL | 1 refills | Status: AC

## 2016-10-01 NOTE — Telephone Encounter (Signed)
Terry Love is requesting a refill on calcium-vitamin D (OSCAL WITH D) 500-200 MG-UNIT per tablet  Called to [facility] in [place]

## 2016-10-08 ENCOUNTER — Other Ambulatory Visit: Payer: Self-pay | Admitting: Family Medicine

## 2016-10-11 ENCOUNTER — Other Ambulatory Visit: Payer: Self-pay

## 2016-10-11 ENCOUNTER — Telehealth: Payer: Self-pay | Admitting: *Deleted

## 2016-10-11 MED ORDER — VITAMIN D (ERGOCALCIFEROL) 1.25 MG (50000 UNIT) PO CAPS: ORAL_CAPSULE | ORAL | 0 refills | Status: DC

## 2016-10-11 NOTE — Telephone Encounter (Signed)
Rx sent to pharm

## 2016-10-11 NOTE — Telephone Encounter (Signed)
Patient's wife called stating patient needs Vitamin D2 1.25mg  50000 units 1 a week sent to [facility] [address]. Please advise [phone]

## 2016-11-28 ENCOUNTER — Telehealth: Payer: Self-pay | Admitting: Family Medicine

## 2016-11-28 NOTE — Telephone Encounter (Signed)
Left message

## 2016-11-28 NOTE — Telephone Encounter (Signed)
No labs needed

## 2016-11-28 NOTE — Telephone Encounter (Signed)
Patient has appt on Monday. Asking if he has blood work.  Please call and leave message if he does not answer.

## 2016-11-29 ENCOUNTER — Other Ambulatory Visit: Payer: Self-pay | Admitting: Family Medicine

## 2016-12-03 ENCOUNTER — Encounter: Payer: Self-pay | Admitting: Family Medicine

## 2016-12-03 ENCOUNTER — Ambulatory Visit (INDEPENDENT_AMBULATORY_CARE_PROVIDER_SITE_OTHER): Payer: Medicare Other | Admitting: Family Medicine

## 2016-12-03 VITALS — BP 120/70 | HR 55 | Temp 98.9°F | Resp 16 | Ht 70.0 in | Wt 231.0 lb

## 2016-12-03 DIAGNOSIS — F419 Anxiety disorder, unspecified: Secondary | ICD-10-CM

## 2016-12-03 DIAGNOSIS — F5105 Insomnia due to other mental disorder: Secondary | ICD-10-CM

## 2016-12-03 DIAGNOSIS — Z23 Encounter for immunization: Secondary | ICD-10-CM | POA: Diagnosis not present

## 2016-12-03 DIAGNOSIS — E785 Hyperlipidemia, unspecified: Secondary | ICD-10-CM | POA: Diagnosis not present

## 2016-12-03 DIAGNOSIS — J4530 Mild persistent asthma, uncomplicated: Secondary | ICD-10-CM | POA: Diagnosis not present

## 2016-12-03 DIAGNOSIS — F418 Other specified anxiety disorders: Secondary | ICD-10-CM | POA: Diagnosis not present

## 2016-12-03 DIAGNOSIS — E669 Obesity, unspecified: Secondary | ICD-10-CM

## 2016-12-03 NOTE — Patient Instructions (Addendum)
F/U IN 4 MONTHS, CALL IF YOU NEED ME BEFORE, office will mail your follow up   Medications as we have reviewed  Flu vaccine today  Please keep appointment with lung specialist and commit to twice daily symbicort, this will help to keep you out of hospital and  Help breathing  STOP citalopram today and start venlafaxine as directed  You are referred for puulmonary rehab to improve exercise tolerance , this will be very good for you  [facility]

## 2016-12-06 ENCOUNTER — Ambulatory Visit (INDEPENDENT_AMBULATORY_CARE_PROVIDER_SITE_OTHER): Payer: Medicare Other | Admitting: Pulmonary Disease

## 2016-12-06 ENCOUNTER — Encounter: Payer: Self-pay | Admitting: Pulmonary Disease

## 2016-12-06 VITALS — BP 122/70 | HR 56 | Ht 70.0 in | Wt 226.8 lb

## 2016-12-06 DIAGNOSIS — J4530 Mild persistent asthma, uncomplicated: Secondary | ICD-10-CM | POA: Diagnosis not present

## 2016-12-06 MED ORDER — BUDESONIDE-FORMOTEROL FUMARATE 160-4.5 MCG/ACT IN AERO: 2.0000 | INHALATION_SPRAY | Freq: Two times a day (BID) | RESPIRATORY_TRACT | 3 refills | Status: DC

## 2016-12-06 NOTE — Progress Notes (Signed)
Terry Love    [id number]    [birth year]  Primary Care Physician:Simpson, Norwood Levo, MD  Referring Physician: Fayrene Helper, MD [address] [place], [place] [address]  Chief complaint:  Follow-up for mild persistent asthma  HPI: 81 year old with past medical history of insomnia, snoring, vitamin D deficiency, hyperlipidemia depression with anxiety. He was admitted in March 2018 with presumed COPD exacerbation treated with IV steroids, antibiotics and nebulizers. He was discharged on prednisone taper which he finished today and doxycycline. He is being maintained on albuterol inhaler but feels that his breathing has improved. He complained does not have any cough, dyspnea, shortness of breath, wheezing, sputum production, fevers, chills. He is lifelong nonsmoker but used to chew tobacco. He worked various jobs including the [employer] He does not report any exposures at work or at home. He has history of insomnia evaluated by a sleep study in February 2017. This was reportedly normal per the patient.  Interim history: He was started on Symbicort at last visit. He reports improvement in symptoms of dyspnea, wheezing. He hardly needs to use albuterol rescue inhaler  Outpatient Encounter Prescriptions as of 12/06/2016  Medication Sig  . albuterol (PROVENTIL HFA;VENTOLIN HFA) 108 (90 Base) MCG/ACT inhaler Inhale 2 puffs into the lungs every 4 (four) hours as needed for wheezing or shortness of breath (cough, shortness of breath or wheezing.).  Marland Kitchen aspirin (BAYER ASPIRIN EC LOW DOSE) 81 MG EC tablet Take 81 mg by mouth daily.    Marland Kitchen atorvastatin (LIPITOR) 40 MG tablet Take 1 tablet (40 mg total) by mouth at bedtime.  Marland Kitchen azelastine (ASTELIN) 0.1 % nasal spray Place 2 sprays into both nostrils 2 (two) times daily. Use in each nostril as directed  . budesonide-formoterol (SYMBICORT) 160-4.5 MCG/ACT inhaler Inhale 2 puffs into the lungs 2 (two) times daily.  .  calcium-vitamin D (OSCAL WITH D) 500-200 MG-UNIT tablet Take 1 tablet by mouth 2 (two) times daily.  . montelukast (SINGULAIR) 10 MG tablet TAKE 1 TABLET BY MOUTH AT BEDTIME  . Multiple Vitamin (MULTIVITAMIN) tablet Take 1 tablet by mouth daily. Unspecified frequency  . SILENOR 6 MG TABS Take 1 tablet by mouth daily.  . Venlafaxine HCl 37.5 MG TB24 USE AS DIRECTED, TITRATING UP TO 4 PER DAY OVER A  20 DAY PERIOD (va)  . vitamin C (ASCORBIC ACID) 500 MG tablet Take 500 mg by mouth daily.  . Vitamin D, Ergocalciferol, (DRISDOL) 50000 units CAPS capsule TAKE ONE CAPSULE BY MOUTH A WEEK  . Wheat Dextrin (BENEFIBER DRINK MIX PO) Take by mouth. Use as directed daily   No facility-administered encounter medications on file as of 12/06/2016.     Allergies as of 12/06/2016  . (No Known Allergies)    Past Medical History:  Diagnosis Date  . Anxiety   . Bradycardia    Chronic   . Colon polyps   . Depression   . Facial tic   . Hearing loss   . Hyperlipidemia   . 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 Obesity   . Seasonal allergies     Past Surgical History:  Procedure Laterality Date  . carpal tunnel  release right  2006  . COLONOSCOPY    . COLONOSCOPY  01/25/2011   Dr. Rehman:Examination performed to cecum Pan colonic diverticulosis/2 small polyps ablated via cold biopsy from transverse colon/External hemorrhoids, benign polyps  . COLONOSCOPY N/A 06/29/2013   Procedure: COLONOSCOPY;  Surgeon: Daneil Dolin, MD;  [facility];  Service: Endoscopy;  Laterality: N/A;  10:45    Family History  Problem Relation Age of Onset  . Colon cancer Mother        diagnosed at age 79  . Aneurysm Father   . Aneurysm Brother   . Stroke Brother     Social History   Social History  . Marital status: Married    Spouse name: N/A  . Number of children: N/A  . Years of education: N/A   Occupational History  . Not on file.   Social History Main Topics  . Smoking  status: Never Smoker  . Smokeless tobacco: Former Systems developer    Types: [place] date: 03/13/2016  . Alcohol use No  . Drug use: No  . Sexual activity: No   Other Topics Concern  . Not on file   Social History Narrative  . No narrative on file   Review of systems: Review of Systems  Constitutional: Negative for fever and chills.  HENT: Negative.   Eyes: Negative for blurred vision.  Respiratory: as per HPI  Cardiovascular: Negative for chest pain and palpitations.  Gastrointestinal: Negative for vomiting, diarrhea, blood per rectum. Genitourinary: Negative for dysuria, urgency, frequency and hematuria.  Musculoskeletal: Negative for myalgias, back pain and joint pain.  Skin: Negative for itching and rash.  Neurological: Negative for dizziness, tremors, focal weakness, seizures and loss of consciousness.  Endo/Heme/Allergies: Negative for environmental allergies.  Psychiatric/Behavioral: Negative for depression, suicidal ideas and hallucinations.  All other systems reviewed and are negative.  Physical Exam: Blood pressure 120/62, pulse (!) 49, height 5\' 10"  (1.778 m), weight 229 lb (103.9 kg), SpO2 94 %. Gen:      No acute distress HEENT:  EOMI, sclera anicteric Neck:     No masses; no thyromegaly Lungs:    Clear to auscultation bilaterally; normal respiratory effort CV:         Regular rate and rhythm; no murmurs Abd:      + bowel sounds; soft, non-tender; no palpable masses, no distension Ext:    No edema; adequate peripheral perfusion Skin:      Warm and dry; no rash Neuro: alert and oriented x 3 Psych: normal mood and affect  Data Reviewed: Chest x-ray 06/24/16-no acute cardiopulmonary abnormality. I reviewed the images personally.  Echo 06/23/15-  - Mild LVH with LVEF 60-65%. Grade 1 diastolic dysfunction with   increased LV filling pressure. Mild to moderate left atrial   enlargement. Mildly ectatic aortic root. Sclerotic aortic valve   without stenosis. Overall mild  aortic regurgitation made up of 2   small jets. Upper normal right atrial chamber size. Trivial   tricuspid regurgitation, unable to assess PASP.  PFTs 09/05/16 FVC 2.90 (84%) FEV1 2.32 (92%) F/F 80 TLC 82% DLCO 79% Minimal reduction in diffusion capacity.  FENO 09/05/16- 54  CBC with diff 09/05/16- WBC 8.2, 5.4% eos, absolute eso count 460 Blood allergy profile 09/05/16- negative RAST, IgE 150  Assessment:  Mild persistent asthma PFTs which did not show any obstruction. There is some suggestion of small airway disease as he has improvement in his medical records. He does not have emphysema. I likely has asthma given high FENO, eos and IgE with recurrent attacks of wheezing  Symptoms have improved with Symbicort and he'll continue the same. He will try to get this filled through the [place] in the meantime we will give him a prescription to  continue therapy until then.  Plan/Recommendations: - Continue symbicort 160/4.5 - Continue albuterol PRN  Marshell Garfinkel MD [facility] Pager [phone] 12/06/2016, 1:55 PM  CC: Fayrene Helper, MD

## 2016-12-06 NOTE — Patient Instructions (Signed)
We will call in a prescription for Symbicort. [facility] that Continue the albuterol rescue inhaler  Return to clinic in 6 months.

## 2016-12-07 ENCOUNTER — Ambulatory Visit: Payer: Medicare Other | Admitting: Cardiovascular Disease

## 2016-12-08 ENCOUNTER — Other Ambulatory Visit: Payer: Self-pay | Admitting: Family Medicine

## 2016-12-09 NOTE — Assessment & Plan Note (Signed)
Being treatd through the [facility] with medication and therapy Will transition form citalopram to effexor, starting on day of visit per direction from [place], I have reassured both patient and his wife that the dose is appropriate

## 2016-12-09 NOTE — Assessment & Plan Note (Signed)
Deteriorated. Patient re-educated about  the importance of commitment to a  minimum of 150 minutes of exercise per week.  The importance of healthy food choices with portion control discussed. Encouraged to start a food diary, count calories and to consider  joining a support group. Sample diet sheets offered. Goals set by the patient for the next several months.   Weight /BMI 12/06/2016 12/03/2016 09/05/2016  WEIGHT 226 lb 12.8 oz 231 lb 229 lb  HEIGHT 5\' 10"  5\' 10"  5\' 10"   BMI 32.54 kg/m2 33.15 kg/m2 32.86 kg/m2

## 2016-12-09 NOTE — Progress Notes (Signed)
   Terry Love     MRN: [id number]      DOB: 27-May-1935   HPI Terry Love is here for follow up and re-evaluation of chronic medical conditions, medication management and review of any available recent lab and radiology data.  Preventive health is updated, specifically  Cancer screening and Immunization.   Questions or concerns regarding consultations or procedures which the PT has had in the interim are  Addressed.Has s[pecific questions about dose of [facility]fe[facility] of citalopram.The PT denies any adverse reactions to current medications since the last visit.  C/o excess fatigue and poor exercise tolerance ROS Denies recent fever or chills. Denies sinus pressure, nasal congestion, ear pain or sore throat. Denies chest congestion, productive cough or wheezing. Denies chest pains, palpitations and leg swelling Denies abdominal pain, nausea, vomiting,diarrhea or constipation.   Denies dysuria, frequency, hesitancy or incontinence. Denies joint pain, swelling and limitation in mobility. Denies headaches, seizures, numbness, or tingling. Denies uncontrolled depression, anxiety or insomnia. Denies skin break down or rash.   PE  BP 120/70   Pulse (!) 55   Temp 98.9 F (37.2 C) (Oral)   Resp 16   Ht 5\' 10"  (1.778 m)   Wt 231 lb (104.8 kg)   SpO2 96%   BMI 33.15 kg/m   Patient alert and oriented and in no cardiopulmonary distress.  HEENT: No facial asymmetry, EOMI,   oropharynx pink and moist.  Neck supple no JVD, no mass.  Chest: Clear to auscultation bilaterally.  CVS: S1, S2 no murmurs, no S3.Regular rate.  ABD: Soft non tender.   Ext: No edema  MS: Adequate though reduced  ROM spine, shoulders, hips and knees.  Skin: Intact, no ulcerations or rash noted.  Psych: Good eye contact, blunted  affect. Memory impaired not anxious mildly  depressed appearing.  CNS: CN 2-12 intact, power,  normal throughout.no focal deficits noted.   Assessment &  Plan  Mild persistent asthma Improved on current medication, he is to continue same and weill f/u with [pulmonary as planned. Reports poor exercise tolerance, will refer to pulmonary rehab  Depression with anxiety Being treatd through the [facility] with medication and therapy Will transition form citalopram to effexor, starting on day of visit per direction from [place], I have reassured both patient and his wife that the dose is appropriate  Obesity (BMI 30.0-34.9) Deteriorated. Patient re-educated about  the importance of commitment to a  minimum of 150 minutes of exercise per week.  The importance of healthy food choices with portion control discussed. Encouraged to start a food diary, count calories and to consider  joining a support group. Sample diet sheets offered. Goals set by the patient for the next several months.   Weight /BMI 12/06/2016 12/03/2016 09/05/2016  WEIGHT 226 lb 12.8 oz 231 lb 229 lb  HEIGHT 5\' 10"  5\' 10"  5\' 10"   BMI 32.54 kg/m2 33.15 kg/m2 32.86 kg/m2      Insomnia secondary to anxiety Sleep hygiene reviewed and written information offered also. Prescription sent for  medication needed. Controlled, no change in medication   Hyperlipidemia LDL goal <100 Hyperlipidemia:Low fat diet discussed and encouraged.   Lipid Panel  Lab Results  Component Value Date   CHOL 145 08/25/2016   HDL 51 08/25/2016   LDLCALC 80 08/25/2016   TRIG 70 08/25/2016   CHOLHDL 2.8 08/25/2016   Controlled, no change in medication

## 2016-12-09 NOTE — Assessment & Plan Note (Signed)
Sleep hygiene reviewed and written information offered also. Prescription sent for  medication needed. Controlled, no change in medication  

## 2016-12-09 NOTE — Assessment & Plan Note (Signed)
Hyperlipidemia:Low fat diet discussed and encouraged.   Lipid Panel  Lab Results  Component Value Date   CHOL 145 08/25/2016   HDL 51 08/25/2016   LDLCALC 80 08/25/2016   TRIG 70 08/25/2016   CHOLHDL 2.8 08/25/2016   Controlled, no change in medication

## 2016-12-09 NOTE — Assessment & Plan Note (Signed)
Improved on current medication, he is to continue same and weill f/u with [pulmonary as planned. Reports poor exercise tolerance, will refer to pulmonary rehab

## 2016-12-11 ENCOUNTER — Encounter: Payer: Self-pay | Admitting: Cardiovascular Disease

## 2016-12-11 ENCOUNTER — Ambulatory Visit (INDEPENDENT_AMBULATORY_CARE_PROVIDER_SITE_OTHER): Payer: Medicare Other | Admitting: Cardiovascular Disease

## 2016-12-11 VITALS — BP 112/68 | HR 56 | Ht 70.0 in | Wt 226.0 lb

## 2016-12-11 DIAGNOSIS — R5383 Other fatigue: Secondary | ICD-10-CM

## 2016-12-11 DIAGNOSIS — R001 Bradycardia, unspecified: Secondary | ICD-10-CM

## 2016-12-11 DIAGNOSIS — E7800 Pure hypercholesterolemia, unspecified: Secondary | ICD-10-CM | POA: Diagnosis not present

## 2016-12-11 NOTE — Patient Instructions (Signed)
Your physician wants you to follow-up in:  January 2019   With Dr Virgina Jock will receive a reminder letter in the mail two months in advance. If you don't receive a letter, please call our office to schedule the follow-up appointment.   Your physician recommends that you continue on your current medications as directed. Please refer to the Current Medication list given to you today.   If you need a refill on your cardiac medications before your next appointment, please call your pharmacy.     No labs or tests ordered today.       Thank you for choosing [facility] !

## 2016-12-11 NOTE — Progress Notes (Signed)
SUBJECTIVE: The patient presents for follow-up of fatigue, bradycardia, and poor exercise tolerance.  Normal nuclear stress test 03/27/16, LVEF 70%.  Event monitoring demonstrated predominantly normal sinus rhythm with occasional sinus bradycardia. The lowest heart rate was in the 50 bpm range. There was one isolated episode of chest pain which correlated with normal sinus rhythm.  Sleep study did not demonstrate any evidence of sleep apnea.  He denies chest pain and shortness of breath today. He also denies dizziness and near syncope. His energy levels are overall stable since his last visit.  He is here with his wife and they have been married for over 37 years.     Review of Systems: As per "subjective", otherwise negative.  No Known Allergies  Current Outpatient Prescriptions  Medication Sig Dispense Refill  . albuterol (PROVENTIL HFA;VENTOLIN HFA) 108 (90 Base) MCG/ACT inhaler Inhale 2 puffs into the lungs every 4 (four) hours as needed for wheezing or shortness of breath (cough, shortness of breath or wheezing.). 1 Inhaler 1  . aspirin (BAYER ASPIRIN EC LOW DOSE) 81 MG EC tablet Take 81 mg by mouth daily.      Marland Kitchen atorvastatin (LIPITOR) 40 MG tablet Take 1 tablet (40 mg total) by mouth at bedtime. 90 tablet 1  . atorvastatin (LIPITOR) 40 MG tablet TAKE 1 TABLET AT BEDTIME 90 tablet 1  . azelastine (ASTELIN) 0.1 % nasal spray Place 2 sprays into both nostrils 2 (two) times daily. Use in each nostril as directed 30 mL 12  . budesonide-formoterol (SYMBICORT) 160-4.5 MCG/ACT inhaler Inhale 2 puffs into the lungs 2 (two) times daily. 1 Inhaler 3  . calcium-vitamin D (OSCAL WITH D) 500-200 MG-UNIT tablet Take 1 tablet by mouth 2 (two) times daily. 150 tablet 1  . montelukast (SINGULAIR) 10 MG tablet TAKE 1 TABLET BY MOUTH AT BEDTIME 30 tablet 3  . Multiple Vitamin (MULTIVITAMIN) tablet Take 1 tablet by mouth daily. Unspecified frequency    . SILENOR 6 MG TABS Take 1 tablet by  mouth daily.  5  . Venlafaxine HCl 37.5 MG TB24 USE AS DIRECTED, TITRATING UP TO 4 PER DAY OVER A  20 DAY PERIOD (va) 120 each   . vitamin C (ASCORBIC ACID) 500 MG tablet Take 500 mg by mouth daily.    . Vitamin D, Ergocalciferol, (DRISDOL) 50000 units CAPS capsule TAKE ONE CAPSULE BY MOUTH A WEEK 12 capsule 0  . Wheat Dextrin (BENEFIBER DRINK MIX PO) Take by mouth. Use as directed daily     No current facility-administered medications for this visit.     Past Medical History:  Diagnosis Date  . Anxiety   . Bradycardia    Chronic   . Colon polyps   . Depression   . Facial tic   . Hearing loss   . Hyperlipidemia   . 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 Obesity   . Seasonal allergies     Past Surgical History:  Procedure Laterality Date  . carpal tunnel  release right  2006  . COLONOSCOPY    . COLONOSCOPY  01/25/2011   Dr. Rehman:Examination performed to cecum Pan colonic diverticulosis/2 small polyps ablated via cold biopsy from transverse colon/External hemorrhoids, benign polyps  . COLONOSCOPY N/A 06/29/2013   Procedure: COLONOSCOPY;  Surgeon: Daneil Dolin, MD;  [facility];  Service: Endoscopy;  Laterality: N/A;  10:45    Social History   Social History  . Marital status: Married  Spouse name: N/A  . Number of children: N/A  . Years of education: N/A   Occupational History  . Not on file.   Social History Main Topics  . Smoking status: Never Smoker  . Smokeless tobacco: Former Systems developer    Types: [place] date: 03/13/2016  . Alcohol use No  . Drug use: No  . Sexual activity: No   Other Topics Concern  . Not on file   Social History Narrative  . No narrative on file     Vitals:   12/11/16 0956  BP: 112/68  Pulse: (!) 56  SpO2: 94%  Weight: 226 lb (102.5 kg)  Height: 5\' 10"  (1.778 m)    Wt Readings from Last 3 Encounters:  12/11/16 226 lb (102.5 kg)  12/06/16 226 lb 12.8 oz (102.9 kg)  12/03/16 231 lb (104.8  kg)     PHYSICAL EXAM General: NAD HEENT: Normal. Neck: No JVD, no thyromegaly. Lungs: Clear to auscultation bilaterally with normal respiratory effort. CV: Bradycardic, regular rhythm, normal S1/S2, no H2/C9, soft systolicmurmur over RUSB. No peripheral edema.  Abdomen: Soft, nontender, no distention.  Neurologic: Alert and oriented.  Psych: Normal affect. Skin: Normal. Musculoskeletal: No gross deformities.    ECG: Most recent ECG reviewed.   Labs: Lab Results  Component Value Date/Time   K 4.4 08/25/2016 09:21 AM   BUN 12 08/25/2016 09:21 AM   CREATININE 0.68 (L) 08/25/2016 09:21 AM   ALT 17 08/25/2016 09:21 AM   TSH 1.31 08/25/2016 09:21 AM   HGB 13.7 09/05/2016 12:59 PM     Lipids: Lab Results  Component Value Date/Time   LDLCALC 80 08/25/2016 09:21 AM   CHOL 145 08/25/2016 09:21 AM   TRIG 70 08/25/2016 09:21 AM   HDL 51 08/25/2016 09:21 AM       ASSESSMENT AND PLAN:  1. Bradycardia/exertional dyspnea/fatigue/abnormal ECG: Symptoms appear to be stable. TSH normal 06/20/15. Not on AV nodal blocking agents.  HR by auscultation 48 bpm. Normal stress test and unremarkable event monitor. Sleep study also unremarkable. PFTs showed no obstruction and some suggestion of small airway disease. It appears he has mild persistent asthma. Symptoms may be due to cardiovascular deconditioning. I will continue to monitor his heart rate to evaluate for symptomatically bradycardia. I told him that if symptoms should progress and his heart rate continues to drop, he may require pacemaker placement.  2. Hyperlipidemia:Lipids 08/25/16 total cholesterol 145, triglycerides 70, HDL 51, LDL 80. Continue statin therapy.     Disposition: Follow up January 2019   Kate Sable, M.D., F.A.C.C.

## 2016-12-24 ENCOUNTER — Other Ambulatory Visit: Payer: Self-pay | Admitting: Family Medicine

## 2017-01-14 DIAGNOSIS — F5105 Insomnia due to other mental disorder: Secondary | ICD-10-CM | POA: Diagnosis not present

## 2017-01-14 DIAGNOSIS — R5383 Other fatigue: Secondary | ICD-10-CM | POA: Diagnosis not present

## 2017-01-14 DIAGNOSIS — F331 Major depressive disorder, recurrent, moderate: Secondary | ICD-10-CM | POA: Diagnosis not present

## 2017-01-14 DIAGNOSIS — R252 Cramp and spasm: Secondary | ICD-10-CM | POA: Diagnosis not present

## 2017-01-28 ENCOUNTER — Ambulatory Visit (INDEPENDENT_AMBULATORY_CARE_PROVIDER_SITE_OTHER): Payer: Medicare Other

## 2017-01-28 DIAGNOSIS — Z23 Encounter for immunization: Secondary | ICD-10-CM | POA: Diagnosis not present

## 2017-02-08 ENCOUNTER — Other Ambulatory Visit: Payer: Self-pay | Admitting: Family Medicine

## 2017-03-16 ENCOUNTER — Other Ambulatory Visit: Payer: Self-pay | Admitting: Family Medicine

## 2017-04-17 ENCOUNTER — Ambulatory Visit (INDEPENDENT_AMBULATORY_CARE_PROVIDER_SITE_OTHER): Payer: Medicare Other | Admitting: Family Medicine

## 2017-04-17 ENCOUNTER — Encounter: Payer: Self-pay | Admitting: Family Medicine

## 2017-04-17 VITALS — BP 118/78 | HR 60 | Resp 16 | Ht 70.0 in | Wt 228.0 lb

## 2017-04-17 DIAGNOSIS — J4530 Mild persistent asthma, uncomplicated: Secondary | ICD-10-CM

## 2017-04-17 DIAGNOSIS — E559 Vitamin D deficiency, unspecified: Secondary | ICD-10-CM | POA: Diagnosis not present

## 2017-04-17 DIAGNOSIS — F419 Anxiety disorder, unspecified: Secondary | ICD-10-CM

## 2017-04-17 DIAGNOSIS — F418 Other specified anxiety disorders: Secondary | ICD-10-CM

## 2017-04-17 DIAGNOSIS — R001 Bradycardia, unspecified: Secondary | ICD-10-CM | POA: Diagnosis not present

## 2017-04-17 DIAGNOSIS — L819 Disorder of pigmentation, unspecified: Secondary | ICD-10-CM | POA: Diagnosis not present

## 2017-04-17 DIAGNOSIS — F5105 Insomnia due to other mental disorder: Secondary | ICD-10-CM | POA: Diagnosis not present

## 2017-04-17 DIAGNOSIS — E669 Obesity, unspecified: Secondary | ICD-10-CM | POA: Diagnosis not present

## 2017-04-17 DIAGNOSIS — Z125 Encounter for screening for malignant neoplasm of prostate: Secondary | ICD-10-CM | POA: Diagnosis not present

## 2017-04-17 DIAGNOSIS — E785 Hyperlipidemia, unspecified: Secondary | ICD-10-CM | POA: Diagnosis not present

## 2017-04-17 NOTE — Patient Instructions (Addendum)
F/u with rectal end May, callidf you need me sooner  CB, fasting lipid , cmp and EGFr, PsA, tSH and vit D May 20,or after   No medication changes   community involvement is KEY to enjoying a fulfilling life  Best for 2019!!! Thank you  for choosing [facility]. We consider it a privelige to serve you.  Delivering excellent health care in a caring and  compassionate way is our goal.  Partnering with you,  so that together we can achieve this goal is our strategy.

## 2017-04-26 ENCOUNTER — Encounter: Payer: Self-pay | Admitting: Family Medicine

## 2017-04-26 NOTE — Assessment & Plan Note (Signed)
Hyperlipidemia:Low fat diet discussed and encouraged.   Lipid Panel  Lab Results  Component Value Date   CHOL 145 08/25/2016   HDL 51 08/25/2016   LDLCALC 80 08/25/2016   TRIG 70 08/25/2016   CHOLHDL 2.8 08/25/2016  Controlled, no change in medication Updated lab needed at/ before next visit.

## 2017-04-26 NOTE — Assessment & Plan Note (Signed)
Deteriorated. Patient re-educated about  the importance of commitment to a  minimum of 150 minutes of exercise per week.  The importance of healthy food choices with portion control discussed. Encouraged to start a food diary, count calories and to consider  joining a support group. Sample diet sheets offered. Goals set by the patient for the next several months.   Weight /BMI 04/17/2017 12/11/2016 12/06/2016  WEIGHT 228 lb 226 lb 226 lb 12.8 oz  HEIGHT 5\' 10"  5\' 10"  5\' 10"   BMI 32.71 kg/m2 32.43 kg/m2 32.54 kg/m2

## 2017-04-26 NOTE — Assessment & Plan Note (Signed)
Managed through [facility] with medication and therapy, stable per spouse , encouraged to become involved with community service as able, not suicidals or homicidal

## 2017-04-26 NOTE — Assessment & Plan Note (Signed)
Reports inadequate control, sleep hygiene reviewed, medication management is through psychiatry

## 2017-04-26 NOTE — Assessment & Plan Note (Signed)
Controlled on current regime , no recent wheezing

## 2017-04-26 NOTE — Progress Notes (Signed)
   Terry Love     MRN: [id number]      DOB: 01-Oct-1935   HPI [name] is here for follow up and re-evaluation of chronic medical conditions, medication management and review of any available recent lab and radiology data.   The PT denies any adverse reactions to current medications since the last visit. C/o hearing loss and depression, feels as though not much has changed, however wife feels there is is some improvement and he would do better if he would get out and become involved in community service   ROS Denies recent fever or chills. Denies sinus pressure, nasal congestion, ear pain or sore throat. Denies chest congestion, productive cough or wheezing. Denies chest pains, palpitations and leg swelling Denies abdominal pain, nausea, vomiting,diarrhea or constipation.   Denies dysuria, frequency, hesitancy or incontinence. Denies uncontrolled  joint pain,c/o  limitation in mobility. Denies headaches, seizures, numbness, or tingling. C/o  depression, anxiety and  insomnia. Denies skin break down or rash.   PE  BP 118/78   Pulse 60   Resp 16   Ht 5\' 10"  (1.778 m)   Wt 228 lb (103.4 kg)   SpO2 94%   BMI 32.71 kg/m   Patient alert and oriented and in no cardiopulmonary distress.  HEENT: No facial asymmetry, EOMI,   oropharynx pink and moist.  Neck decreased ROM no JVD, no mass.  Chest: Clear to auscultation bilaterally.  CVS: S1, S2 no murmurs, no S3.Regular rate.  ABD: Soft non tender.   Ext: No edema  MS: Adequate though reduced ROM spine, shoulders, hips and knees.  Skin: Intact, no ulcerations or rash noted.  Psych: Good eye contact, blunted  affect. Memory impaired not anxious mildly depressed appearing.  CNS: CN 2-12 intact, power,  normal throughout.no focal deficits noted.   Assessment & Plan Depression with anxiety Managed through [facility]A with medication and therapy, stable per spouse , encouraged to become involved with community service as able, not  suicidals or homicidal  Hyperlipidemia LDL goal <100 Hyperlipidemia:Low fat diet discussed and encouraged.   Lipid Panel  Lab Results  Component Value Date   CHOL 145 08/25/2016   HDL 51 08/25/2016   LDLCALC 80 08/25/2016   TRIG 70 08/25/2016   CHOLHDL 2.8 08/25/2016  Controlled, no change in medication Updated lab needed at/ before next visit.      Obesity (BMI 30.0-34.9) Deteriorated. Patient re-educated about  the importance of commitment to a  minimum of 150 minutes of exercise per week.  The importance of healthy food choices with portion control discussed. Encouraged to start a food diary, count calories and to consider  joining a support group. Sample diet sheets offered. Goals set by the patient for the next several months.   Weight /BMI 04/17/2017 12/11/2016 12/06/2016  WEIGHT 228 lb 226 lb 226 lb 12.8 oz  HEIGHT 5\' 10"  5\' 10"  5\' 10"   BMI 32.71 kg/m2 32.43 kg/m2 32.54 kg/m2      Mild persistent asthma Controlled on current regime , no recent wheezing  Insomnia secondary to anxiety Reports inadequate control, sleep hygiene reviewed, medication management is through psychiatry

## 2017-05-11 ENCOUNTER — Other Ambulatory Visit: Payer: Self-pay | Admitting: Family Medicine

## 2017-05-12 ENCOUNTER — Other Ambulatory Visit: Payer: Self-pay | Admitting: Family Medicine

## 2017-05-13 NOTE — Telephone Encounter (Signed)
Seen [phone]

## 2017-05-17 ENCOUNTER — Other Ambulatory Visit: Payer: Self-pay

## 2017-05-17 MED ORDER — ALBUTEROL SULFATE HFA 108 (90 BASE) MCG/ACT IN AERS: 2.0000 | INHALATION_SPRAY | RESPIRATORY_TRACT | 11 refills | Status: DC | PRN

## 2017-05-22 ENCOUNTER — Encounter: Payer: Self-pay | Admitting: Cardiovascular Disease

## 2017-05-22 ENCOUNTER — Ambulatory Visit (INDEPENDENT_AMBULATORY_CARE_PROVIDER_SITE_OTHER): Payer: Medicare Other | Admitting: Cardiovascular Disease

## 2017-05-22 ENCOUNTER — Other Ambulatory Visit (HOSPITAL_COMMUNITY)
Admission: RE | Admit: 2017-05-22 | Discharge: 2017-05-22 | Disposition: A | Discharge status: Home / Self Care | Payer: Medicare Other | Source: Ambulatory Visit | Attending: Cardiovascular Disease | Admitting: Cardiovascular Disease

## 2017-05-22 VITALS — BP 152/74 | HR 65 | Ht 70.0 in | Wt 231.0 lb

## 2017-05-22 DIAGNOSIS — R9431 Abnormal electrocardiogram [ECG] [EKG]: Secondary | ICD-10-CM | POA: Diagnosis not present

## 2017-05-22 DIAGNOSIS — R0609 Other forms of dyspnea: Secondary | ICD-10-CM

## 2017-05-22 DIAGNOSIS — R5383 Other fatigue: Secondary | ICD-10-CM | POA: Diagnosis not present

## 2017-05-22 DIAGNOSIS — R001 Bradycardia, unspecified: Secondary | ICD-10-CM | POA: Diagnosis not present

## 2017-05-22 DIAGNOSIS — M25619 Stiffness of unspecified shoulder, not elsewhere classified: Secondary | ICD-10-CM | POA: Insufficient documentation

## 2017-05-22 DIAGNOSIS — R030 Elevated blood-pressure reading, without diagnosis of hypertension: Secondary | ICD-10-CM | POA: Diagnosis not present

## 2017-05-22 DIAGNOSIS — E7800 Pure hypercholesterolemia, unspecified: Secondary | ICD-10-CM

## 2017-05-22 LAB — C-REACTIVE PROTEIN: CRP: <0.8 mg/dL (ref <1.0)

## 2017-05-22 LAB — SEDIMENTATION RATE: SED RATE: 8 mm/h (ref 0–16)

## 2017-05-22 NOTE — Patient Instructions (Signed)
Medication Instructions:  Your physician recommends that you continue on your current medications as directed. Please refer to the Current Medication list given to you today.   Labwork: ASAP  ESR CRP  Testing/Procedures: NONE  Follow-Up: Your physician recommends that you schedule a follow-up appointment in: 4 MONTHS    Any Other Special Instructions Will Be Listed Below (If Applicable).     If you need a refill on your cardiac medications before your next appointment, please call your pharmacy.

## 2017-05-22 NOTE — Progress Notes (Signed)
SUBJECTIVE: The patient presents for follow-up of fatigue, bradycardia, and poor exercise tolerance.  Normal nuclear stress test 03/27/16, LVEF 70%.  Event monitoring demonstrated predominantly normal sinus rhythm with occasional sinus bradycardia. The lowest heart rate was in the 50 bpm range. There was one isolated episode of chest pain which correlated with normal sinus rhythm.  Sleep study did not demonstrate any evidence of sleep apnea.  He previously underwent pulmonary function testing and was evaluated by pulmonology.  It appeared he may have had some asthma which is alleviated with inhalers.  Pulmonary function testing did not demonstrate any significant obstruction.  ECG performed today demonstrates sinus bradycardia, 56 bpm, nonspecific T wave abnormalities in inferolateral leads with T wave inversions in leads I and aVL.  He walks on a treadmill and does not experience shortness of breath.  He does get tired at times and has to rest.  He denies chest pain.  He also denies lightheadedness, dizziness, and syncope.  He sometimes has shortness of breath when walking on a track but sometimes it occurs at rest.  He denies palpitations, leg swelling, orthopnea, and paroxysmal nocturnal dyspnea.  He does have some fatigue when lifting his arms above his head.  Both his hips and shoulders are somewhat stiff in the mornings.    Social history: He is married.  He and his wife have been married for over 55 years.   Review of Systems: As per "subjective", otherwise negative.  No Known Allergies  Current Outpatient Medications  Medication Sig Dispense Refill  . albuterol (PROVENTIL HFA;VENTOLIN HFA) 108 (90 Base) MCG/ACT inhaler Inhale 2 puffs into the lungs every 4 (four) hours as needed for wheezing or shortness of breath (cough, shortness of breath or wheezing.). 1 Inhaler 11  . aspirin (BAYER ASPIRIN EC LOW DOSE) 81 MG EC tablet Take 81 mg by mouth daily.      Marland Kitchen  atorvastatin (LIPITOR) 40 MG tablet Take 1 tablet (40 mg total) by mouth at bedtime. 90 tablet 1  . atorvastatin (LIPITOR) 40 MG tablet TAKE 1 TABLET AT BEDTIME 90 tablet 1  . azelastine (ASTELIN) 0.1 % nasal spray Place 2 sprays into both nostrils 2 (two) times daily. Use in each nostril as directed 30 mL 12  . budesonide-formoterol (SYMBICORT) 160-4.5 MCG/ACT inhaler Inhale 2 puffs into the lungs 2 (two) times daily. 1 Inhaler 3  . calcium-vitamin D (OSCAL WITH D) 500-200 MG-UNIT tablet Take 1 tablet by mouth 2 (two) times daily. 150 tablet 1  . montelukast (SINGULAIR) 10 MG tablet TAKE 1 TABLET BY MOUTH AT BEDTIME 90 tablet 0  . Multiple Vitamin (MULTIVITAMIN) tablet Take 1 tablet by mouth daily. Unspecified frequency    . SILENOR 6 MG TABS Take 1 tablet by mouth daily.  5  . Venlafaxine HCl 37.5 MG TB24 USE AS DIRECTED, TITRATING UP TO 4 PER DAY OVER A  20 DAY PERIOD (va) 120 each   . vitamin C (ASCORBIC ACID) 500 MG tablet Take 500 mg by mouth daily.    . Vitamin D, Ergocalciferol, (DRISDOL) 50000 units CAPS capsule TAKE ONE CAPSULE BY MOUTH A WEEK 12 capsule 0  . Wheat Dextrin (BENEFIBER DRINK MIX PO) Take by mouth. Use as directed daily     No current facility-administered medications for this visit.     Past Medical History:  Diagnosis Date  . Anxiety   . Bradycardia    Chronic   . Colon polyps   . Depression   .  Facial tic   . Hearing loss   . Hyperlipidemia   . 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 Obesity   . Seasonal allergies     Past Surgical History:  Procedure Laterality Date  . carpal tunnel  release right  2006  . COLONOSCOPY    . COLONOSCOPY  01/25/2011   Dr. Rehman:Examination performed to cecum Pan colonic diverticulosis/2 small polyps ablated via cold biopsy from transverse colon/External hemorrhoids, benign polyps  . COLONOSCOPY N/A 06/29/2013   Procedure: COLONOSCOPY;  Surgeon: Daneil Dolin, MD;  [facility]: [facility];  Service:  Endoscopy;  Laterality: N/A;  10:45    Social History   Socioeconomic History  . Marital status: Married    Spouse name: Not on file  . Number of children: Not on file  . Years of education: Not on file  . Highest education level: Not on file  Social Needs  . Financial resource strain: Not on file  . Food insecurity - worry: Not on file  . Food insecurity - inability: Not on file  . Transportation needs - medical: Not on file  . Transportation needs - non-medical: Not on file  Occupational History  . Not on file  Tobacco Use  . Smoking status: Never Smoker  . Smokeless tobacco: Former Systems developer    Types: Chew  Substance and Sexual Activity  . Alcohol use: No    Alcohol/week: 0.0 oz  . Drug use: No  . Sexual activity: No  Other Topics Concern  . Not on file  Social History Narrative  . Not on file     Vitals:   05/22/17 1055  BP: (!) 152/74  Pulse: 65  SpO2: 95%  Weight: 231 lb (104.8 kg)  Height: 5\' 10"  (1.778 m)    Wt Readings from Last 3 Encounters:  05/22/17 231 lb (104.8 kg)  04/17/17 228 lb (103.4 kg)  12/11/16 226 lb (102.5 kg)     PHYSICAL EXAM General: NAD HEENT: Normal. Neck: No JVD, no thyromegaly. Lungs: Clear to auscultation bilaterally with normal respiratory effort. CV: Regular rate and rhythm, normal S1/S2, no Z6/X0, soft systolicmurmur over RUSB. No pretibial or periankle edema.  No carotid bruit.   Abdomen: Soft, nontender, no distention.  Neurologic: Alert and oriented.  Psych: Normal affect. Skin: Normal. Musculoskeletal: No gross deformities.    ECG: Most recent ECG reviewed.   Labs: Lab Results  Component Value Date/Time   K 4.4 08/25/2016 09:21 AM   BUN 12 08/25/2016 09:21 AM   CREATININE 0.68 (L) 08/25/2016 09:21 AM   ALT 17 08/25/2016 09:21 AM   TSH 1.31 08/25/2016 09:21 AM   HGB 13.7 09/05/2016 12:59 PM     Lipids: Lab Results  Component Value Date/Time   LDLCALC 80 08/25/2016 09:21 AM   CHOL 145 08/25/2016 09:21  AM   TRIG 70 08/25/2016 09:21 AM   HDL 51 08/25/2016 09:21 AM       ASSESSMENT AND PLAN:  1. Bradycardia/exertional dyspnea/fatigue/abnormal ECG: Symptoms appear to be stable. TSH normal 08/25/16. Not on AV nodal blocking agents.  HR is normal today.  It was 48 bpm by auscultation at his last visit. Normal stress test and unremarkable event monitor. Sleep study also unremarkable. PFTs showed no obstruction and some suggestion of small airway disease. It appears he has mild persistent asthma. Symptoms may be due to cardiovascular deconditioning.  I am not convinced his symptoms are ischemic in etiology.  I have asked him to keep a  symptom diary to see if there is any indication of symptom progression at the time of his next visit. I will continue to monitor his heart rate to evaluate for symptomatically bradycardia. I told him that if symptoms should progress and his heart rate continues to drop, he may require pacemaker placement.  2. Hyperlipidemia:Lipids 08/25/16 total cholesterol 145, triglycerides 70, HDL 51, LDL 80. Continue statin therapy.  3.  Elevated blood pressure: Blood pressure is elevated today but pressures have traditionally been normal.  I will monitor to see if antihypertensive therapy is warranted.  4.  Bilateral shoulder and hip morning stiffness: He may have the onset of polymyalgia rheumatica.  I will check an erythrocyte sedimentation rate as well as a C-reactive protein.    Disposition: Follow up 4 months   Kate Sable, M.D., F.A.C.C.

## 2017-05-27 ENCOUNTER — Telehealth: Payer: Self-pay | Admitting: Family Medicine

## 2017-05-27 NOTE — Telephone Encounter (Signed)
Patient left message on line upfront stating he was having trouble breathing. I have called patient back and left a message to return call.

## 2017-05-27 NOTE — Telephone Encounter (Signed)
Called back- no answer. Called back and left message that if he was having trouble breathing to go to the urgent care or ER for evaluation or if needing appt to call office to schedule

## 2017-05-31 ENCOUNTER — Encounter: Payer: Self-pay | Admitting: Pulmonary Disease

## 2017-05-31 ENCOUNTER — Ambulatory Visit (INDEPENDENT_AMBULATORY_CARE_PROVIDER_SITE_OTHER): Payer: Medicare Other | Admitting: Pulmonary Disease

## 2017-05-31 VITALS — BP 138/78 | HR 64 | Ht 70.0 in | Wt 226.4 lb

## 2017-05-31 DIAGNOSIS — J4530 Mild persistent asthma, uncomplicated: Secondary | ICD-10-CM

## 2017-05-31 NOTE — Patient Instructions (Signed)
I am glad that your breathing is doing well Continue the Symbicort You can use over-the-counter Nasonex for sinus congestion Follow-up in 1 year.

## 2017-05-31 NOTE — Progress Notes (Signed)
Terry Love    [id number]    19-Jan-1936  Primary Care Physician:Simpson, Norwood Levo, MD  Referring Physician: Fayrene Helper, MD [address] [place], [place] [address]  Chief complaint:  Follow-up for mild persistent asthma  HPI: 82 year old with past medical history of insomnia, snoring, vitamin D deficiency, hyperlipidemia depression with anxiety. He was admitted in March 2018 with presumed COPD exacerbation treated with IV steroids, antibiotics and nebulizers. He was discharged on prednisone taper which he finished today and doxycycline. He is being maintained on albuterol inhaler but feels that his breathing has improved. He complained does not have any cough, dyspnea, shortness of breath, wheezing, sputum production, fevers, chills. He is lifelong nonsmoker but used to chew tobacco. He worked various jobs including the [employer] He does not report any exposures at work or at home. He has history of insomnia evaluated by a sleep study in February 2017. This was reportedly normal per the patient.  Interim history: Continues on Symbicort.  Breathing is at baseline with no exacerbation.  He hardly needs to use his rescue inhaler Reports some sinus congestion and nasal discharge.  No fevers, chills.  Outpatient Encounter Medications as of 05/31/2017  Medication Sig  . albuterol (PROVENTIL HFA;VENTOLIN HFA) 108 (90 Base) MCG/ACT inhaler Inhale 2 puffs into the lungs every 4 (four) hours as needed for wheezing or shortness of breath (cough, shortness of breath or wheezing.).  Marland Kitchen aspirin (BAYER ASPIRIN EC LOW DOSE) 81 MG EC tablet Take 81 mg by mouth daily.    Marland Kitchen atorvastatin (LIPITOR) 40 MG tablet Take 1 tablet (40 mg total) by mouth at bedtime.  Marland Kitchen azelastine (ASTELIN) 0.1 % nasal spray Place 2 sprays into both nostrils 2 (two) times daily. Use in each nostril as directed  . budesonide-formoterol (SYMBICORT) 160-4.5 MCG/ACT inhaler Inhale 2 puffs into  the lungs 2 (two) times daily.  . calcium-vitamin D (OSCAL WITH D) 500-200 MG-UNIT tablet Take 1 tablet by mouth 2 (two) times daily.  . montelukast (SINGULAIR) 10 MG tablet TAKE 1 TABLET BY MOUTH AT BEDTIME  . Multiple Vitamin (MULTIVITAMIN) tablet Take 1 tablet by mouth daily. Unspecified frequency  . SILENOR 6 MG TABS Take 1 tablet by mouth daily.  . Venlafaxine HCl 37.5 MG TB24 USE AS DIRECTED, TITRATING UP TO 4 PER DAY OVER A  20 DAY PERIOD (va)  . vitamin C (ASCORBIC ACID) 500 MG tablet Take 500 mg by mouth daily.  . Vitamin D, Ergocalciferol, (DRISDOL) 50000 units CAPS capsule TAKE ONE CAPSULE BY MOUTH A WEEK  . Wheat Dextrin (BENEFIBER DRINK MIX PO) Take by mouth. Use as directed daily  . [DISCONTINUED] atorvastatin (LIPITOR) 40 MG tablet TAKE 1 TABLET AT BEDTIME   No facility-administered encounter medications on file as of 05/31/2017.     Allergies as of 05/31/2017  . (No Known Allergies)    Past Medical History:  Diagnosis Date  . Anxiety   . Bradycardia    Chronic   . Colon polyps   . Depression   . Facial tic   . Hearing loss   . Hyperlipidemia   . 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 Obesity   . Seasonal allergies     Past Surgical History:  Procedure Laterality Date  . carpal tunnel  release right  2006  . COLONOSCOPY    . COLONOSCOPY  01/25/2011   Dr. Rehman:Examination performed to cecum Pan colonic diverticulosis/2 small  polyps ablated via cold biopsy from transverse colon/External hemorrhoids, benign polyps  . COLONOSCOPY N/A 06/29/2013   Procedure: COLONOSCOPY;  Surgeon: Daneil Dolin, MD;  Location: [facility];  Service: Endoscopy;  Laterality: N/A;  10:45    Family History  Problem Relation Age of Onset  . Colon cancer Mother        diagnosed at age 61  . Aneurysm Father   . Aneurysm Brother   . Stroke Brother     Social History   Socioeconomic History  . Marital status: Married    Spouse name: Not on file  . Number  of children: Not on file  . Years of education: Not on file  . Highest education level: Not on file  Social Needs  . Financial resource strain: Not on file  . Food insecurity - worry: Not on file  . Food insecurity - inability: Not on file  . Transportation needs - medical: Not on file  . Transportation needs - non-medical: Not on file  Occupational History  . Not on file  Tobacco Use  . Smoking status: Never Smoker  . Smokeless tobacco: Former Systems developer    Types: Chew  Substance and Sexual Activity  . Alcohol use: No    Alcohol/week: 0.0 oz  . Drug use: No  . Sexual activity: No  Other Topics Concern  . Not on file  Social History Narrative  . Not on file   Review of systems: Review of Systems  Constitutional: Negative for fever and chills.  HENT: Negative.   Eyes: Negative for blurred vision.  Respiratory: as per HPI  Cardiovascular: Negative for chest pain and palpitations.  Gastrointestinal: Negative for vomiting, diarrhea, blood per rectum. Genitourinary: Negative for dysuria, urgency, frequency and hematuria.  Musculoskeletal: Negative for myalgias, back pain and joint pain.  Skin: Negative for itching and rash.  Neurological: Negative for dizziness, tremors, focal weakness, seizures and loss of consciousness.  Endo/Heme/Allergies: Negative for environmental allergies.  Psychiatric/Behavioral: Negative for depression, suicidal ideas and hallucinations.  All other systems reviewed and are negative.  Physical Exam: Blood pressure 138/78, pulse 64, height 5\' 10"  (1.778 m), weight 226 lb 6.4 oz (102.7 kg), SpO2 97 %. Gen:      No acute distress HEENT:  EOMI, sclera anicteric Neck:     No masses; no thyromegaly Lungs:    Clear to auscultation bilaterally; normal respiratory effort CV:         Regular rate and rhythm; no murmurs Abd:      + bowel sounds; soft, non-tender; no palpable masses, no distension Ext:    No edema; adequate peripheral perfusion Skin:      Warm and  dry; no rash Neuro: alert and oriented x 3 Psych: normal mood and affect  Data Reviewed: Chest x-ray 06/24/16-no acute cardiopulmonary abnormality. I reviewed the images personally.  Echo 06/23/15-  - Mild LVH with LVEF 60-65%. Grade 1 diastolic dysfunction with   increased LV filling pressure. Mild to moderate left atrial   enlargement. Mildly ectatic aortic root. Sclerotic aortic valve   without stenosis. Overall mild aortic regurgitation made up of 2   small jets. Upper normal right atrial chamber size. Trivial   tricuspid regurgitation, unable to assess PASP.  PFTs 09/05/16 FVC 2.90 (84%), FEV1 2.32 (92%), F/F 80, TLC 82%, DLCO 79% Minimal reduction in diffusion capacity.  FENO 09/05/16- 54  CBC with diff 09/05/16- WBC 8.2, 5.4% eos, absolute eso count 460 Blood allergy profile 09/05/16- negative RAST, IgE  150  Assessment:  Mild persistent asthma PFTs which did not show any obstruction. There is some suggestion of small airway disease as he has improvement in his medical records. He does not have emphysema. Likely asthma given high FENO, eos and IgE with recurrent attacks of wheezing  Symptoms have improved with Symbicort and he'll continue the same.  Advised him to use over-the-counter nasonex for sinus congestion.   Plan/Recommendations: - Continue symbicort 160/4.5, albuterol PRN - Nasonex  Marshell Garfinkel MD [facility] Pager [phone] 05/31/2017, 11:43 AM  CC: Fayrene Helper, MD

## 2017-06-05 DIAGNOSIS — Z0000 Encounter for general adult medical examination without abnormal findings: Secondary | ICD-10-CM | POA: Diagnosis not present

## 2017-06-14 ENCOUNTER — Other Ambulatory Visit: Payer: Self-pay | Admitting: Family Medicine

## 2017-09-02 ENCOUNTER — Other Ambulatory Visit: Payer: Self-pay | Admitting: Pulmonary Disease

## 2017-09-03 DIAGNOSIS — E785 Hyperlipidemia, unspecified: Secondary | ICD-10-CM | POA: Diagnosis not present

## 2017-09-03 DIAGNOSIS — E559 Vitamin D deficiency, unspecified: Secondary | ICD-10-CM | POA: Diagnosis not present

## 2017-09-03 DIAGNOSIS — R001 Bradycardia, unspecified: Secondary | ICD-10-CM | POA: Diagnosis not present

## 2017-09-04 ENCOUNTER — Encounter: Payer: Self-pay | Admitting: Family Medicine

## 2017-09-04 ENCOUNTER — Ambulatory Visit (INDEPENDENT_AMBULATORY_CARE_PROVIDER_SITE_OTHER): Payer: Medicare Other | Admitting: Family Medicine

## 2017-09-04 ENCOUNTER — Other Ambulatory Visit: Payer: Self-pay | Admitting: Family Medicine

## 2017-09-04 ENCOUNTER — Other Ambulatory Visit: Payer: Self-pay

## 2017-09-04 VITALS — BP 126/74 | HR 51 | Ht 70.0 in | Wt 218.1 lb

## 2017-09-04 DIAGNOSIS — E669 Obesity, unspecified: Secondary | ICD-10-CM | POA: Diagnosis not present

## 2017-09-04 DIAGNOSIS — E785 Hyperlipidemia, unspecified: Secondary | ICD-10-CM | POA: Diagnosis not present

## 2017-09-04 DIAGNOSIS — E559 Vitamin D deficiency, unspecified: Secondary | ICD-10-CM | POA: Diagnosis not present

## 2017-09-04 DIAGNOSIS — F418 Other specified anxiety disorders: Secondary | ICD-10-CM | POA: Diagnosis not present

## 2017-09-04 DIAGNOSIS — J4530 Mild persistent asthma, uncomplicated: Secondary | ICD-10-CM | POA: Diagnosis not present

## 2017-09-04 NOTE — Patient Instructions (Signed)
Wellness with nurse in 4.5 months, call if you need me sooner  F/U with MD in early December.  Blood work is excellent  You are slightly anemic., extra labs are added  Blood sugar slightly high, extra labs are added   Please return 3 stool cards for [place] to test for hidden blood  Congrats on weight loss due to good health habits

## 2017-09-06 LAB — CBC
HCT: 37.8 % — ABNORMAL LOW (ref 38.5–50.0)
Hemoglobin: 12.6 g/dL — ABNORMAL LOW (ref 13.2–17.1)
MCH: 30.4 pg (ref 27.0–33.0)
MCHC: 33.3 g/dL (ref 32.0–36.0)
MCV: 91.1 fL (ref 80.0–100.0)
MPV: 10.2 fL (ref 7.5–12.5)
Platelets: 291 10*3/uL (ref 140–400)
RBC: 4.15 10*6/uL — ABNORMAL LOW (ref 4.20–5.80)
RDW: 11.2 % (ref 11.0–15.0)
WBC: 5.4 10*3/uL (ref 3.8–10.8)

## 2017-09-06 LAB — COMPLETE METABOLIC PANEL WITH GFR
AG RATIO: 1.6 (calc) (ref 1.0–2.5)
ALBUMIN MSPROF: 4.2 g/dL (ref 3.6–5.1)
ALT: 17 U/L (ref 9–46)
AST: 22 U/L (ref 10–35)
Alkaline phosphatase (APISO): 57 U/L (ref 40–115)
BILIRUBIN TOTAL: 0.6 mg/dL (ref 0.2–1.2)
BUN: 14 mg/dL (ref 7–25)
CALCIUM: 9.2 mg/dL (ref 8.6–10.3)
CO2: 29 mmol/L (ref 20–32)
Chloride: 106 mmol/L (ref 98–110)
Creat: 0.78 mg/dL (ref 0.70–1.11)
GFR, EST NON AFRICAN AMERICAN: 85 mL/min/{1.73_m2} (ref >=60)
GFR, Est African American: 99 mL/min/{1.73_m2} (ref >=60)
GLOBULIN: 2.7 g/dL (ref 1.9–3.7)
Glucose, Bld: 111 mg/dL — ABNORMAL HIGH (ref 65–99)
POTASSIUM: 4.2 mmol/L (ref 3.5–5.3)
SODIUM: 140 mmol/L (ref 135–146)
Total Protein: 6.9 g/dL (ref 6.1–8.1)

## 2017-09-06 LAB — LIPID PANEL
CHOLESTEROL: 131 mg/dL (ref <200)
HDL: 49 mg/dL (ref >40)
LDL Cholesterol (Calc): 68 mg/dL (calc)
Non-HDL Cholesterol (Calc): 82 mg/dL (calc) (ref <130)
TRIGLYCERIDES: 56 mg/dL (ref <150)
Total CHOL/HDL Ratio: 2.7 (calc) (ref <5.0)

## 2017-09-06 LAB — VITAMIN D 25 HYDROXY (VIT D DEFICIENCY, FRACTURES): Vit D, 25-Hydroxy: 50 ng/mL (ref 30–100)

## 2017-09-06 LAB — TSH: TSH: 1.97 mIU/L (ref 0.40–4.50)

## 2017-09-06 LAB — HEMOGLOBIN A1C W/OUT EAG: HEMOGLOBIN A1C: 5.2 %{Hb} (ref <5.7)

## 2017-09-06 LAB — FERRITIN: FERRITIN: 155 ng/mL (ref 20–380)

## 2017-09-06 LAB — IRON: Iron: 74 ug/dL (ref 50–180)

## 2017-09-06 LAB — PSA: PSA: 0.1 ng/mL (ref <=4.0)

## 2017-09-10 DIAGNOSIS — Z1211 Encounter for screening for malignant neoplasm of colon: Secondary | ICD-10-CM

## 2017-09-10 LAB — HEMOCCULT GUIAC POC 1CARD (OFFICE)
FECAL OCCULT BLD: NEGATIVE
FECAL OCCULT BLD: NEGATIVE
Fecal Occult Blood, POC: NEGATIVE

## 2017-09-20 ENCOUNTER — Other Ambulatory Visit: Payer: Self-pay | Admitting: Pulmonary Disease

## 2017-09-20 MED ORDER — BUDESONIDE-FORMOTEROL FUMARATE 160-4.5 MCG/ACT IN AERO: INHALATION_SPRAY | RESPIRATORY_TRACT | 2 refills | Status: DC

## 2017-09-21 ENCOUNTER — Encounter: Payer: Self-pay | Admitting: Family Medicine

## 2017-09-21 NOTE — Progress Notes (Signed)
   Terry Love     MRN: [id number]      DOB: 10-May-1935   HPI Terry Love is here for follow up and re-evaluation of chronic medical conditions, medication management and review of any available recent lab  data. He comes with a copy of his labs from the [place] done in Jan 2019,CBC, lipids, TSH  And kidney function and Vit D level. The Hb is low at 12.8 , otherwise labs are good. Life line screen is reviewed only significant abnormality is PAD screen and BMI  Preventive health is updated, specifically  Cancer screening and Immunization.    The PT denies any adverse reactions to current medications since the last visit.  Voices concern over his wife and her stress caring for her sister with sever dementia   ROS Denies recent fever or chills. Denies sinus pressure, nasal congestion, ear pain or sore throat. Denies chest congestion, productive cough or wheezing. Denies chest pains, palpitations and leg swelling Denies abdominal pain, nausea, vomiting,diarrhea or constipation.   Denies dysuria, frequency, hesitancy or incontinence. Denies  Uncontrolled joint pain or  swelling , he does have some  limitation in mobility. Denies headaches, seizures, numbness, or tingling. Does c/o  depression, anxiety and  Insomnia Reports benefit from current treatment at the [place] Denies skin break down or rash.   PE  BP 126/74 (BP Location: Left Arm, Patient Position: Sitting, Cuff Size: Normal)   Pulse (!) 51   Ht 5\' 10"  (1.778 m)   Wt 218 lb 1.3 oz (98.9 kg)   SpO2 96%   BMI 31.29 kg/m   Patient alert and oriented and in no cardiopulmonary distress.  HEENT: No facial asymmetry, EOMI,   oropharynx pink and moist.  Neck supple no JVD, no mass.  Chest: Clear to auscultation bilaterally.  CVS: S1, S2 no murmurs, no S3.Regular rate.  ABD: Soft non tender.   Ext: No edema  MS: decreased  ROM spine, shoulders, hips and knees.  Skin: Intact, no ulcerations or rash noted.  Psych: Good eye contact,  normal affect. Memory impaired not anxious or depressed appearing.  CNS: CN 2-12 intact, power,  normal throughout.no focal deficits noted.   Assessment & Plan  Hyperlipidemia LDL goal <100 Hyperlipidemia:Low fat diet discussed and encouraged.   Lipid Panel  Lab Results  Component Value Date   CHOL 131 09/03/2017   HDL 49 09/03/2017   LDLCALC 68 09/03/2017   TRIG 56 09/03/2017   CHOLHDL 2.7 09/03/2017   Controlled, no change in medication     Depression with anxiety Improved, managed by psychiatry at the [facility], being treated for PTSD  Obesity (BMI 30.0-34.9) Marked improvement. Patient re-educated about  the importance of commitment to a  minimum of 150 minutes of exercise per week.  The importance of healthy food choices with portion control discussed. Encouraged to start a food diary, count calories and to consider  joining a support group. Sample diet sheets offered. Goals set by the patient for the next several months.   Weight /BMI 09/04/2017 05/31/2017 05/22/2017  WEIGHT 218 lb 1.3 oz 226 lb 6.4 oz 231 lb  HEIGHT 5\' 10"  5\' 10"  5\' 10"   BMI 31.29 kg/m2 32.49 kg/m2 33.15 kg/m2      Vitamin D deficiency corrected  Mild persistent asthma Stable and controlled

## 2017-09-21 NOTE — Assessment & Plan Note (Signed)
Marked improvement. Patient re-educated about  the importance of commitment to a  minimum of 150 minutes of exercise per week.  The importance of healthy food choices with portion control discussed. Encouraged to start a food diary, count calories and to consider  joining a support group. Sample diet sheets offered. Goals set by the patient for the next several months.   Weight /BMI 09/04/2017 05/31/2017 05/22/2017  WEIGHT 218 lb 1.3 oz 226 lb 6.4 oz 231 lb  HEIGHT 5\' 10"  5\' 10"  5\' 10"   BMI 31.29 kg/m2 32.49 kg/m2 33.15 kg/m2

## 2017-09-21 NOTE — Assessment & Plan Note (Signed)
corrected

## 2017-09-21 NOTE — Assessment & Plan Note (Signed)
Hyperlipidemia:Low fat diet discussed and encouraged.   Lipid Panel  Lab Results  Component Value Date   CHOL 131 09/03/2017   HDL 49 09/03/2017   LDLCALC 68 09/03/2017   TRIG 56 09/03/2017   CHOLHDL 2.7 09/03/2017  Controlled, no change in medication     

## 2017-09-21 NOTE — Assessment & Plan Note (Signed)
Improved, managed by psychiatry at the [facility], being treated for PTSD

## 2017-09-21 NOTE — Assessment & Plan Note (Signed)
Stable and controlled

## 2017-09-30 ENCOUNTER — Ambulatory Visit (INDEPENDENT_AMBULATORY_CARE_PROVIDER_SITE_OTHER): Payer: Medicare Other | Admitting: Cardiovascular Disease

## 2017-09-30 ENCOUNTER — Encounter: Payer: Self-pay | Admitting: Cardiovascular Disease

## 2017-09-30 VITALS — BP 128/68 | HR 58 | Ht 70.5 in | Wt 221.0 lb

## 2017-09-30 DIAGNOSIS — R5383 Other fatigue: Secondary | ICD-10-CM

## 2017-09-30 DIAGNOSIS — R001 Bradycardia, unspecified: Secondary | ICD-10-CM

## 2017-09-30 DIAGNOSIS — E7800 Pure hypercholesterolemia, unspecified: Secondary | ICD-10-CM

## 2017-09-30 NOTE — Patient Instructions (Addendum)
Your physician wants you to follow-up in: 6 months with Dr.Koneswaran You will receive a reminder letter in the mail two months in advance. If you don't receive a letter, please call our office to schedule the follow-up appointment.   Your physician recommends that you continue on your current medications as directed. Please refer to the Current Medication list given to you today.    If you need a refill on your cardiac medications before your next appointment, please call your pharmacy.     No lab work or tests ordered today.     Thank you for choosing [facility] !         

## 2017-09-30 NOTE — Progress Notes (Signed)
SUBJECTIVE: The patient presents for follow-up of bradycardia, exertional dyspnea, and fatigue.  In summary, he underwent a normal nuclear stress test on 03/27/16, LVEF 70%.  Event monitoring demonstrated predominantly normal sinus rhythm with occasional sinus bradycardia. The lowest heart rate was in the 50 bpm range. There was one isolated episode of chest pain which correlated with normal sinus rhythm.  Sleep study did not demonstrate any evidence of sleep apnea.  He previously underwent pulmonary function testing and was evaluated by pulmonology.  It appeared he may have had some asthma which is alleviated with inhalers.  Pulmonary function testing did not demonstrate any significant obstruction.  He is here with his wife.  He used to feel more fatigued in the mornings but then in the evening medication was stopped and energy levels have improved.  He denies exertional chest pain.  He seldom has shortness of breath.  He gets symptomatic relief from his inhaler.  He denies lightheadedness, dizziness, leg swelling, and syncope.   Social history: He is married.  He and his wife have been married for over 14 years.   Review of Systems: As per "subjective", otherwise negative.  No Known Allergies  Current Outpatient Medications  Medication Sig Dispense Refill  . albuterol (PROVENTIL HFA;VENTOLIN HFA) 108 (90 Base) MCG/ACT inhaler Inhale 2 puffs into the lungs every 4 (four) hours as needed for wheezing or shortness of breath (cough, shortness of breath or wheezing.). 1 Inhaler 11  . aspirin (BAYER ASPIRIN EC LOW DOSE) 81 MG EC tablet Take 81 mg by mouth daily.      Marland Kitchen atorvastatin (LIPITOR) 40 MG tablet Take 1 tablet (40 mg total) by mouth at bedtime. 90 tablet 1  . azelastine (ASTELIN) 0.1 % nasal spray Place 2 sprays into both nostrils 2 (two) times daily. Use in each nostril as directed 30 mL 12  . budesonide-formoterol (SYMBICORT) 160-4.5 MCG/ACT inhaler TAKE 2 PUFFS BY MOUTH  TWICE A DAY 10.2 Inhaler 2  . calcium-vitamin D (OSCAL WITH D) 500-200 MG-UNIT tablet Take 1 tablet by mouth 2 (two) times daily. 150 tablet 1  . Multiple Vitamin (MULTIVITAMIN) tablet Take 1 tablet by mouth daily. Unspecified frequency    . SILENOR 6 MG TABS Take 1 tablet by mouth daily.  5  . Venlafaxine HCl 37.5 MG TB24 75 mg daily. USE AS DIRECTED, TITRATING UP TO 4 PER DAY OVER A  20 DAY PERIOD (va) 120 each   . vitamin C (ASCORBIC ACID) 500 MG tablet Take 500 mg by mouth daily.    . Vitamin D, Ergocalciferol, (DRISDOL) 50000 units CAPS capsule TAKE ONE CAPSULE BY MOUTH A WEEK 12 capsule 0  . Wheat Dextrin (BENEFIBER DRINK MIX PO) Take by mouth. Use as directed daily     No current facility-administered medications for this visit.     Past Medical History:  Diagnosis Date  . Anxiety   . Bradycardia    Chronic   . Colon polyps   . Depression   . Facial tic   . Hearing loss   . Hyperlipidemia   . 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 Obesity   . Seasonal allergies     Past Surgical History:  Procedure Laterality Date  . carpal tunnel  release right  2006  . COLONOSCOPY    . COLONOSCOPY  01/25/2011   Dr. Rehman:Examination performed to cecum Pan colonic diverticulosis/2 small polyps ablated via cold biopsy from transverse colon/External hemorrhoids, benign polyps  .  COLONOSCOPY N/A 06/29/2013   Procedure: COLONOSCOPY;  Surgeon: Daneil Dolin, MD;  Location: [facility];  Service: Endoscopy;  Laterality: N/A;  10:45    Social History   Socioeconomic History  . Marital status: Married    Spouse name: Not on file  . Number of children: Not on file  . Years of education: Not on file  . Highest education level: Not on file  Occupational History  . Not on file  Social Needs  . Financial resource strain: Not on file  . Food insecurity:    Worry: Not on file    Inability: Not on file  . Transportation needs:    Medical: Not on file    Non-medical:  Not on file  Tobacco Use  . Smoking status: Never Smoker  . Smokeless tobacco: Former Systems developer    Types: Chew  Substance and Sexual Activity  . Alcohol use: No    Alcohol/week: 0.0 oz  . Drug use: No  . Sexual activity: Never  Lifestyle  . Physical activity:    Days per week: Not on file    Minutes per session: Not on file  . Stress: Not on file  Relationships  . Social connections:    Talks on phone: Not on file    Gets together: Not on file    Attends religious service: Not on file    Active member of club or organization: Not on file    Attends meetings of clubs or organizations: Not on file    Relationship status: Not on file  . Intimate partner violence:    Fear of current or ex partner: Not on file    Emotionally abused: Not on file    Physically abused: Not on file    Forced sexual activity: Not on file  Other Topics Concern  . Not on file  Social History Narrative  . Not on file     Vitals:   09/30/17 1103  BP: 128/68  Pulse: (!) 58  SpO2: 96%  Weight: 221 lb (100.2 kg)  Height: 5' 10.5" (1.791 m)    Wt Readings from Last 3 Encounters:  09/30/17 221 lb (100.2 kg)  09/04/17 218 lb 1.3 oz (98.9 kg)  05/31/17 226 lb 6.4 oz (102.7 kg)     PHYSICAL EXAM General: NAD HEENT: Normal. Neck: No JVD, no thyromegaly. Lungs: Clear to auscultation bilaterally with normal respiratory effort. CV: Bradycardic, regular rhythm, normal S1/S2, no S3/S4, no murmur. No pretibial or periankle edema.  No carotid bruit.   Abdomen: Soft, nontender, no distention.  Neurologic: Alert and oriented.  Psych: Normal affect. Skin: Normal. Musculoskeletal: No gross deformities.    ECG: Most recent ECG reviewed.   Labs: Lab Results  Component Value Date/Time   K 4.2 09/03/2017 07:17 AM   BUN 14 09/03/2017 07:17 AM   CREATININE 0.78 09/03/2017 07:17 AM   ALT 17 09/03/2017 07:17 AM   TSH 1.97 09/03/2017 07:17 AM   HGB 12.6 (L) 09/03/2017 07:17 AM     Lipids: Lab Results    Component Value Date/Time   LDLCALC 68 09/03/2017 07:17 AM   CHOL 131 09/03/2017 07:17 AM   TRIG 56 09/03/2017 07:17 AM   HDL 49 09/03/2017 07:17 AM       ASSESSMENT AND PLAN:  1. Bradycardia/exertional dyspnea/fatigue/abnormal ECG: TSH normal on 09/03/2017.  Symptomatically stable.  Not on AV nodal blocking agents.  Heart rate in high 50 bpm range currently.  Unremarkable stress test, event monitor, and  sleep study.  PFT showed no obstruction and some suggestion of small airway disease.  He has mild persistent asthma.  Symptoms may be due to cardiovascular deconditioning.  I am not convinced his symptoms are ischemic in etiology.  I will continue to monitor his heart rate to evaluate for symptomatic bradycardia.  I told him that if symptoms should progress and his heart rate continues to drop, he may require pacemaker placement.  2. Hyperlipidemia:Lipids5/28/19total cholesterol 131, triglycerides56, HDL49, LDL68. Continue statin therapy.       Disposition: Follow up 6 months   Kate Sable, M.D., F.A.C.C.

## 2017-11-25 ENCOUNTER — Telehealth: Payer: Self-pay | Admitting: Family Medicine

## 2017-11-25 NOTE — Telephone Encounter (Signed)
Patient & spouse stopped in the office to discuss cologuard results. [phone]

## 2017-11-26 NOTE — Telephone Encounter (Signed)
Called to let the patient know we haven't received his Cologuard results yet. Left message requesting call back.

## 2017-11-27 NOTE — Telephone Encounter (Signed)
Called patient to let him know his Cologuard results aren't back. Number busy. Will try again later.

## 2017-11-27 NOTE — Telephone Encounter (Signed)
New Message  Pts wife calling to get results

## 2017-11-28 ENCOUNTER — Other Ambulatory Visit: Payer: Self-pay | Admitting: Family Medicine

## 2017-11-28 NOTE — Telephone Encounter (Signed)
Aware it was normal

## 2017-12-25 ENCOUNTER — Other Ambulatory Visit: Payer: Self-pay

## 2017-12-25 ENCOUNTER — Telehealth: Payer: Self-pay | Admitting: Family Medicine

## 2017-12-25 MED ORDER — ATORVASTATIN CALCIUM 40 MG PO TABS: 40.0000 mg | ORAL_TABLET | Freq: Every day | ORAL | 1 refills | Status: DC

## 2017-12-25 NOTE — Telephone Encounter (Signed)
Patients wife left voicemail requesting refill of atorvastatin to be sent to [facility].

## 2017-12-25 NOTE — Telephone Encounter (Signed)
Refill sent.

## 2017-12-30 ENCOUNTER — Other Ambulatory Visit: Payer: Self-pay

## 2017-12-30 ENCOUNTER — Telehealth: Payer: Self-pay | Admitting: Family Medicine

## 2017-12-30 MED ORDER — ATORVASTATIN CALCIUM 40 MG PO TABS: 40.0000 mg | ORAL_TABLET | Freq: Every day | ORAL | 1 refills | Status: DC

## 2017-12-30 NOTE — Telephone Encounter (Signed)
Atorvastatin refill sent into [facility] in [place]

## 2017-12-30 NOTE — Telephone Encounter (Signed)
atorvastatin (LIPITOR) 40 MG tablet--please send to [facility] in [place]

## 2018-01-01 DIAGNOSIS — J329 Chronic sinusitis, unspecified: Secondary | ICD-10-CM | POA: Diagnosis not present

## 2018-01-03 DIAGNOSIS — Z23 Encounter for immunization: Secondary | ICD-10-CM | POA: Diagnosis not present

## 2018-01-27 ENCOUNTER — Ambulatory Visit (INDEPENDENT_AMBULATORY_CARE_PROVIDER_SITE_OTHER): Payer: Medicare Other

## 2018-01-27 VITALS — BP 106/62 | HR 51 | Resp 10 | Ht 70.0 in | Wt 219.0 lb

## 2018-01-27 DIAGNOSIS — Z0000 Encounter for general adult medical examination without abnormal findings: Secondary | ICD-10-CM

## 2018-01-27 NOTE — Patient Instructions (Addendum)
[name] , Thank you for taking time to come for your Medicare Wellness Visit. I appreciate your ongoing commitment to your health goals. Please review the following plan we discussed and let me know if I can assist you in the future.   Screening recommendations/referrals: Colonoscopy: up to date  Recommended yearly ophthalmology/optometry visit for glaucoma screening and checkup Recommended yearly dental visit for hygiene and checkup  Vaccinations: Influenza vaccine: up to date  Pneumococcal vaccine: up to date  Tdap vaccine: up to date  Shingles vaccine: up to date     Advanced directives: in place   Conditions/risks identified: advanced age, limited mobility, hard of hearing, depression  Next appointment: wellness visit in one year   Preventive Care 42 Years and Older, Male Preventive care refers to lifestyle choices and visits with your health care provider that can promote health and wellness. What does preventive care include?  A yearly physical exam. This is also called an annual well check.  Dental exams once or twice a year.  Routine eye exams. Ask your health care provider how often you should have your eyes checked.  Personal lifestyle choices, including:  Daily care of your teeth and gums.  Regular physical activity.  Eating a healthy diet.  Avoiding tobacco and drug use.  Limiting alcohol use.  Practicing safe sex.  Taking low doses of aspirin every day.  Taking vitamin and mineral supplements as recommended by your health care provider. What happens during an annual well check? The services and screenings done by your health care provider during your annual well check will depend on your age, overall health, lifestyle risk factors, and family history of disease. Counseling  Your health care provider may ask you questions about your:  Alcohol use.  Tobacco use.  Drug use.  Emotional well-being.  Home and relationship well-being.  Sexual  activity.  Eating habits.  History of falls.  Memory and ability to understand (cognition).  Work and work Statistician. Screening  You may have the following tests or measurements:  Height, weight, and BMI.  Blood pressure.  Lipid and cholesterol levels. These may be checked every 5 years, or more frequently if you are over 70 years old.  Skin check.  Lung cancer screening. You may have this screening every year starting at age 29 if you have a 30-pack-year history of smoking and currently smoke or have quit within the past 15 years.  Fecal occult blood test (FOBT) of the stool. You may have this test every year starting at age 66.  Flexible sigmoidoscopy or colonoscopy. You may have a sigmoidoscopy every 5 years or a colonoscopy every 10 years starting at age 81.  Prostate cancer screening. Recommendations will vary depending on your family history and other risks.  Hepatitis C blood test.  Hepatitis B blood test.  Sexually transmitted disease (STD) testing.  Diabetes screening. This is done by checking your blood sugar (glucose) after you have not eaten for a while (fasting). You may have this done every 1-3 years.  Abdominal aortic aneurysm (AAA) screening. You may need this if you are a current or former smoker.  Osteoporosis. You may be screened starting at age 38 if you are at high risk. Talk with your health care provider about your test results, treatment options, and if necessary, the need for more tests. Vaccines  Your health care provider may recommend certain vaccines, such as:  Influenza vaccine. This is recommended every year.  Tetanus, diphtheria, and acellular pertussis (Tdap,  Td) vaccine. You may need a Td booster every 10 years.  Zoster vaccine. You may need this after age 63.  Pneumococcal 13-valent conjugate (PCV13) vaccine. One dose is recommended after age 23.  Pneumococcal polysaccharide (PPSV23) vaccine. One dose is recommended after age  2. Talk to your health care provider about which screenings and vaccines you need and how often you need them. This information is not intended to replace advice given to you by your health care provider. Make sure you discuss any questions you have with your health care provider. Document Released: 04/22/2015 Document Revised: 12/14/2015 Document Reviewed: 01/25/2015 Elsevier Interactive Patient Education  2017 [place] Prevention in the Home Falls can cause injuries. They can happen to people of all ages. There are many things you can do to make your home safe and to help prevent falls. What can I do on the outside of my home?  Regularly fix the edges of walkways and driveways and fix any cracks.  Remove anything that might make you trip as you walk through a door, such as a raised step or threshold.  Trim any bushes or trees on the path to your home.  Use bright outdoor lighting.  Clear any walking paths of anything that might make someone trip, such as rocks or tools.  Regularly check to see if handrails are loose or broken. Make sure that both sides of any steps have handrails.  Any raised decks and porches should have guardrails on the edges.  Have any leaves, snow, or ice cleared regularly.  Use sand or salt on walking paths during winter.  Clean up any spills in your garage right away. This includes oil or grease spills. What can I do in the bathroom?  Use night lights.  Install grab bars by the toilet and in the tub and shower. Do not use towel bars as grab bars.  Use non-skid mats or decals in the tub or shower.  If you need to sit down in the shower, use a plastic, non-slip stool.  Keep the floor dry. Clean up any water that spills on the floor as soon as it happens.  Remove soap buildup in the tub or shower regularly.  Attach bath mats securely with double-sided non-slip rug tape.  Do not have throw rugs and other things on the floor that can make  you trip. What can I do in the bedroom?  Use night lights.  Make sure that you have a light by your bed that is easy to reach.  Do not use any sheets or blankets that are too big for your bed. They should not hang down onto the floor.  Have a firm chair that has side arms. You can use this for support while you get dressed.  Do not have throw rugs and other things on the floor that can make you trip. What can I do in the kitchen?  Clean up any spills right away.  Avoid walking on wet floors.  Keep items that you use a lot in easy-to-reach places.  If you need to reach something above you, use a strong step stool that has a grab bar.  Keep electrical cords out of the way.  Do not use floor polish or wax that makes floors slippery. If you must use wax, use non-skid floor wax.  Do not have throw rugs and other things on the floor that can make you trip. What can I do with my stairs?  Do not  leave any items on the stairs.  Make sure that there are handrails on both sides of the stairs and use them. Fix handrails that are broken or loose. Make sure that handrails are as long as the stairways.  Check any carpeting to make sure that it is firmly attached to the stairs. Fix any carpet that is loose or worn.  Avoid having throw rugs at the top or bottom of the stairs. If you do have throw rugs, attach them to the floor with carpet tape.  Make sure that you have a light switch at the top of the stairs and the bottom of the stairs. If you do not have them, ask someone to add them for you. What else can I do to help prevent falls?  Wear shoes that:  Do not have high heels.  Have rubber bottoms.  Are comfortable and fit you well.  Are closed at the toe. Do not wear sandals.  If you use a stepladder:  Make sure that it is fully opened. Do not climb a closed stepladder.  Make sure that both sides of the stepladder are locked into place.  Ask someone to hold it for you, if  possible.  Clearly mark and make sure that you can see:  Any grab bars or handrails.  First and last steps.  Where the edge of each step is.  Use tools that help you move around (mobility aids) if they are needed. These include:  Canes.  Walkers.  Scooters.  Crutches.  Turn on the lights when you go into a dark area. Replace any light bulbs as soon as they burn out.  Set up your furniture so you have a clear path. Avoid moving your furniture around.  If any of your floors are uneven, fix them.  If there are any pets around you, be aware of where they are.  Review your medicines with your doctor. Some medicines can make you feel dizzy. This can increase your chance of falling. Ask your doctor what other things that you can do to help prevent falls. This information is not intended to replace advice given to you by your health care provider. Make sure you discuss any questions you have with your health care provider. Document Released: 01/20/2009 Document Revised: 09/01/2015 Document Reviewed: 04/30/2014 Elsevier Interactive Patient Education  2017 [employer].

## 2018-01-27 NOTE — Progress Notes (Signed)
Subjective:   Terry Love is a 82 y.o. male who presents for Medicare Annual/Subsequent preventive examination.  Review of Systems:   Cardiac Risk Factors include: advanced age (>62men, >70 women);male gender;obesity (BMI >30kg/m2);smoking/ tobacco exposure;dyslipidemia     Objective:    Vitals: BP 106/62    Pulse (!) 51    Resp 10    Ht 5\' 10"  (1.778 m)    Wt 219 lb (99.3 kg)    SpO2 96%    BMI 31.42 kg/m   Body mass index is 31.42 kg/m.  Advanced Directives 01/27/2018 08/27/2016 06/24/2016 06/24/2016 02/20/2014 10/13/2013 06/29/2013  Does Patient Have a Medical Advance Directive? Yes Yes No No No Patient would like information Patient does not have advance directive;Patient would not like information  Type of Advance Directive Living will;Healthcare Power of [place];Living will - - - - -  Does patient want to make changes to medical advance directive? No - Patient declined - - - - - -  Copy of [place] in Chart? Yes No - copy requested - - - - -  Would patient like information on creating a medical advance directive? - - No - Patient declined - No - patient declined information - -  Pre-existing out of facility DNR order (yellow form or pink MOST form) - - - - - - No    Tobacco Social History   Tobacco Use  Smoking Status Never Smoker  Smokeless Tobacco Former Systems developer   Types: Chew     Counseling given: Not Answered   Clinical Intake:  Pre-visit preparation completed: Yes  Pain : No/denies pain Pain Score: 0-No pain     BMI - recorded: 31.4 Nutritional Status: BMI > 30  Obese Nutritional Risks: None Diabetes: No  How often do you need to have someone help you when you read instructions, pamphlets, or other written materials from your doctor or pharmacy?: 2 - Rarely What is the last grade level you completed in school?: 12 grade   Interpreter Needed?: No  Information entered by :: Francena Hanly LPN   Past Medical  History:  Diagnosis Date   Anxiety    Bradycardia    Chronic    Colon polyps    Depression    Facial tic    Hearing loss    Hyperlipidemia    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 Rehman:Examination performed to cecum Pan colonic diverticulosis/2 small polyps ablated via cold biopsy from transverse colon/External hemorrhoids, benign polyps   COLONOSCOPY N/A 06/29/2013   Procedure: COLONOSCOPY;  Surgeon: Daneil Dolin, MD;  [facility];  Service: Endoscopy;  Laterality: N/A;  10:45   EYE SURGERY     left eye cataracts    Family History  Problem Relation Age of Onset   Colon cancer Mother        diagnosed at age 10   Aneurysm Father    Aneurysm Brother    Stroke Brother    Social History   Socioeconomic History   Marital status: Married    Spouse name: Ceasar Lund    Number of children: 0   Years of education: 12   Highest education level: 12th grade  Occupational History   Occupation: retired  Social Designer, fashion/clothing strain: Not very hard   Food insecurity:    Worry: Never true    Inability: Never true   Transportation needs:    Medical: No    Non-medical: No  Tobacco Use   Smoking status: Never Smoker   Smokeless tobacco: Former Systems developer    Types: Chew  Substance and Sexual Activity   Alcohol use: No    Alcohol/week: 0.0 standard drinks   Drug use: No   Sexual activity: Not Currently  Lifestyle   Physical activity:    Days per week: 3 days    Minutes per session: 20 min   Stress: Not at all  Relationships   Social connections:    Talks on phone: Once a week    Gets together: Once a week    Attends religious service: 1 to 4 times per year    Active member of club or organization: Yes    Attends meetings of clubs or  organizations: 1 to 4 times per year    Relationship status: Married  Other Topics Concern   Not on file  Social History Narrative   Not on file    Outpatient Encounter Medications as of 01/27/2018  Medication Sig   albuterol (PROVENTIL HFA;VENTOLIN HFA) 108 (90 Base) MCG/ACT inhaler Inhale 2 puffs into the lungs every 4 (four) hours as needed for wheezing or shortness of breath (cough, shortness of breath or wheezing.).   aspirin (BAYER ASPIRIN EC LOW DOSE) 81 MG EC tablet Take 81 mg by mouth daily.     atorvastatin (LIPITOR) 40 MG tablet Take 1 tablet (40 mg total) by mouth at bedtime.   azelastine (ASTELIN) 0.1 % nasal spray Place 2 sprays into both nostrils 2 (two) times daily. Use in each nostril as directed   budesonide-formoterol (SYMBICORT) 160-4.5 MCG/ACT inhaler TAKE 2 PUFFS BY MOUTH TWICE A DAY   calcium-vitamin D (OSCAL WITH D) 500-200 MG-UNIT tablet Take 1 tablet by mouth 2 (two) times daily.   Multiple Vitamin (MULTIVITAMIN) tablet Take 1 tablet by mouth daily. Unspecified frequency   SILENOR 6 MG TABS Take 1 tablet by mouth daily.   Venlafaxine HCl 37.5 MG TB24 75 mg daily. USE AS DIRECTED, TITRATING UP TO 4 PER DAY OVER A  20 DAY PERIOD (va)   vitamin C (ASCORBIC ACID) 500 MG tablet Take 500 mg by mouth daily.   Vitamin D, Ergocalciferol, (DRISDOL) 50000 units CAPS capsule TAKE ONE CAPSULE BY MOUTH A WEEK   Wheat Dextrin (BENEFIBER DRINK MIX PO) Take by mouth. Use as directed daily   No facility-administered encounter medications on file as of 01/27/2018.     Activities of Daily Living In your present state of health, do you have any difficulty performing the following activities: 01/27/2018  Hearing? Y  Vision? Y  Difficulty concentrating or making decisions? Y  Walking or climbing stairs? Y  Dressing or bathing? N  Doing errands, shopping? N  Preparing Food and eating ? N  Using the Toilet? N  In the past six months, have you accidently leaked  urine? N  Do you have problems with loss of bowel control? N  Managing your Medications? Y  Managing your Finances? N  Housekeeping or managing your Housekeeping? N  Some recent data might be hidden    Patient Care Team: Fayrene Helper, MD as PCP - General Herminio Commons, MD as PCP - Cardiology (Cardiology) Gala Romney Cristopher Estimable, MD as Consulting Physician (Gastroenterology)   Assessment:  This is a routine wellness examination for [place].  Exercise Activities and Dietary recommendations Current Exercise Habits: Home exercise routine, Type of exercise: walking, Time (Minutes): 20, Frequency (Times/Week): 3, Weekly Exercise (Minutes/Week): 60, Intensity: Mild, Exercise limited by: orthopedic condition(s);respiratory conditions(s)  Goals     DIET - EAT MORE FRUITS AND VEGETABLES     DIET - REDUCE SUGAR INTAKE     Exercise 3x per week (30 min per time)     Recommend starting a routine exercise program at least 3 days a week for 30-45 minutes at a time as tolerated.         Fall Risk Fall Risk  01/27/2018 09/04/2017 04/17/2017 08/27/2016 06/04/2016  Falls in the past year? No No No No No  Risk for fall due to : Impaired balance/gait;Impaired mobility;Impaired vision - - - -   Is the patient's home free of loose throw rugs in walkways, pet beds, electrical cords, etc?   no      Grab bars in the bathroom? no      Handrails on the stairs?   yes      Adequate lighting?   yes  Timed Get Up and Go Performed: Patient able to perform in 7 seconds without assistance   Depression Screen PHQ 2/9 Scores 01/27/2018 09/04/2017 04/17/2017 08/27/2016  PHQ - 2 Score 3 0 3 1  PHQ- 9 Score 11 - 18 -    Cognitive Function     6CIT Screen 01/27/2018 08/27/2016  What Year? 0 points 0 points  What month? 0 points 0 points  What time? 0 points 0 points  Count back from 20 0 points 0 points  Months in reverse 0 points 0 points  Repeat phrase 0 points 0 points  Total Score 0 0     Immunization History  Administered Date(s) Administered   Influenza Split 01/01/2013, 01/01/2014, 01/03/2018   Influenza Whole 01/06/2007, 01/04/2009, 12/23/2009, 12/14/2010   Influenza,inj,Quad PF,6+ Mos 12/14/2015, 12/03/2016, 01/28/2017   Pneumococcal Conjugate-13 10/13/2013   Pneumococcal Polysaccharide-23 02/21/2004   Td 12/23/2009   Tdap 02/20/2014   Zoster 01/06/2007    Qualifies for Shingles Vaccine? Up to date   Screening Tests Health Maintenance  Topic Date Due   TETANUS/TDAP  02/21/2024   INFLUENZA VACCINE  Completed   PNA vac Low Risk Adult  Completed   Cancer Screenings: Lung: Low Dose CT Chest recommended if Age 17-80 years, 30 pack-year currently smoking OR have quit w/in 15years. Patient does not qualify. Colorectal: up to date   Additional Screenings:  Hepatitis C Screening:postponed       Plan:   increase exercise as tolerated, decrease sugar intake, continue counseling   I have personally reviewed and noted the following in the patients chart:    Medical and social history  Use of alcohol, tobacco or illicit drugs   Current medications and supplements  Functional ability and status  Nutritional status  Physical activity  Advanced directives  List of other physicians  Hospitalizations, surgeries, and ER visits in previous 12 months  Vitals  Screenings to include cognitive, depression, and falls  Referrals and appointments  In addition, I have reviewed and discussed with patient certain preventive protocols, quality metrics, and best practice recommendations. A written personalized care plan for preventive services as well as general preventive health recommendations were provided to patient.     Duane Lope Rakes  01/27/2018

## 2018-01-29 ENCOUNTER — Encounter: Payer: Self-pay | Admitting: Family Medicine

## 2018-01-29 ENCOUNTER — Ambulatory Visit (INDEPENDENT_AMBULATORY_CARE_PROVIDER_SITE_OTHER): Payer: Medicare Other | Admitting: Family Medicine

## 2018-01-29 VITALS — BP 120/70 | HR 54 | Resp 12 | Ht 70.0 in | Wt 219.0 lb

## 2018-01-29 DIAGNOSIS — F418 Other specified anxiety disorders: Secondary | ICD-10-CM | POA: Diagnosis not present

## 2018-01-29 DIAGNOSIS — E669 Obesity, unspecified: Secondary | ICD-10-CM | POA: Diagnosis not present

## 2018-01-29 DIAGNOSIS — D649 Anemia, unspecified: Secondary | ICD-10-CM | POA: Diagnosis not present

## 2018-01-29 DIAGNOSIS — E559 Vitamin D deficiency, unspecified: Secondary | ICD-10-CM | POA: Diagnosis not present

## 2018-01-29 DIAGNOSIS — R001 Bradycardia, unspecified: Secondary | ICD-10-CM | POA: Diagnosis not present

## 2018-01-29 DIAGNOSIS — R5383 Other fatigue: Secondary | ICD-10-CM

## 2018-01-29 DIAGNOSIS — E785 Hyperlipidemia, unspecified: Secondary | ICD-10-CM

## 2018-01-29 NOTE — Patient Instructions (Addendum)
F/U  In end Novemebr, call if you need me befor  Please discuss the need to increase the medication for anxiety and depression and please let nurse know name of your Doc so I can  Let he rknow Start taking TWO ever day as originally prescribed and I will  Reassess this also   For anemia and fatigue and sweating we are checking CBB, cmp and EGFR,  TSH and vit D and testosterone levels  You have a f/u with your heart specialist

## 2018-01-30 DIAGNOSIS — D539 Nutritional anemia, unspecified: Secondary | ICD-10-CM | POA: Diagnosis not present

## 2018-01-30 DIAGNOSIS — L749 Eccrine sweat disorder, unspecified: Secondary | ICD-10-CM | POA: Diagnosis not present

## 2018-01-30 DIAGNOSIS — D649 Anemia, unspecified: Secondary | ICD-10-CM | POA: Diagnosis not present

## 2018-01-30 DIAGNOSIS — R7301 Impaired fasting glucose: Secondary | ICD-10-CM | POA: Diagnosis not present

## 2018-01-30 DIAGNOSIS — R5383 Other fatigue: Secondary | ICD-10-CM | POA: Diagnosis not present

## 2018-01-30 DIAGNOSIS — E559 Vitamin D deficiency, unspecified: Secondary | ICD-10-CM | POA: Diagnosis not present

## 2018-02-01 ENCOUNTER — Encounter: Payer: Self-pay | Admitting: Family Medicine

## 2018-02-01 DIAGNOSIS — R5383 Other fatigue: Secondary | ICD-10-CM | POA: Insufficient documentation

## 2018-02-01 NOTE — Progress Notes (Signed)
   Terry Love     MRN: [id number]      DOB: Aug 28, 1935   HPI Terry Love is here for follow up after his recent wellness visit, when he was found to have a PHQ 9 score of 11 on effexor, which he was not taking as directed by Primary Prescriber, Psychiatry at the [place]. He is not suicidal or homicidal , but has a lot of stress in his personal life as well as suffers from PTSD. Involved in both therapy as well as med mx from the [place] Has concerns about excess fatigue, and states that he was recently told that he is anemic at the [place], and is concerned about this  Has cardiology f/u of his bradycardia in January and HR today is 54 and regular ROS Denies recent fever or chills.c/o fatigue Denies sinus pressure, nasal congestion, ear pain or sore throat. Denies chest congestion, productive cough or wheezing. Denies chest pains, palpitations and leg swelling Denies abdominal pain, nausea, vomiting,diarrhea or constipation.   Denies dysuria, frequency, hesitancy or incontinence. Denies uncontrolled  joint pain, swelling and limitation in mobility. Denies headaches, seizures, numbness, or tingling. C/o  depression, anxiety or insomnia. Denies skin break down or rash.   PE  BP 120/70 (BP Location: Left Arm, Patient Position: Sitting, Cuff Size: Large)   Pulse (!) 54   Resp 12   Ht 5\' 10"  (1.778 m)   Wt 219 lb (99.3 kg)   SpO2 97% Comment: room air  BMI 31.42 kg/m   Patient alert and oriented and in no cardiopulmonary distress.  HEENT: No facial asymmetry, EOMI,   oropharynx pink and moist.  Neck supple no JVD, no mass.  Chest: Clear to auscultation bilaterally.  CVS: S1, S2 no murmurs, no S3.Regular rate.  ABD: Soft non tender.   Ext: No edema  MS: Adequate ROM spine, shoulders, hips and knees.  Skin: Intact, no ulcerations or rash noted.  Psych: Good eye contact, normal affect. Memory impaired not anxious or depressed appearing.  CNS: CN 2-12 intact, power,  normal  throughout.no focal deficits noted.   Assessment & Plan  Depression with anxiety Inadequately treated, pt has not been compliant with Prescriber's direction and needs f/u. Advised to increase to 2 tabs daily with psych follow  up  Anemia Anemia with fatigue, if no specific deficiency found will recommend GI  evaluation  Hyperlipidemia LDL goal <100 Hyperlipidemia:Low fat diet discussed and encouraged.   Lipid Panel  Lab Results  Component Value Date   CHOL 131 09/03/2017   HDL 49 09/03/2017   LDLCALC 68 09/03/2017   TRIG 56 09/03/2017   CHOLHDL 2.7 09/03/2017   Controlled, no change in medication     Obesity (BMI 30.0-34.9) Unchanged Patient re-educated about  the importance of commitment to a  minimum of 150 minutes of exercise per week.  The importance of healthy food choices with portion control discussed. Encouraged to start a food diary, count calories and to consider  joining a support group. Sample diet sheets offered. Goals set by the patient for the next several months.   Weight /BMI 01/29/2018 01/27/2018 09/30/2017  WEIGHT 219 lb 219 lb 221 lb  HEIGHT 5\' 10"  5\' 10"  5' 10.5"  BMI 31.42 kg/m2 31.42 kg/m2 31.26 kg/m2      Sinus bradycardia, chronic Unchanged and stable at this visit, cardiology f/u in Jan  Fatigue Reports worsening fatigue, will check thyroid function, blood sugar level and anemia

## 2018-02-01 NOTE — Assessment & Plan Note (Signed)
Unchanged and stable at this visit, cardiology f/u in Jan

## 2018-02-01 NOTE — Assessment & Plan Note (Signed)
Unchanged Patient re-educated about  the importance of commitment to a  minimum of 150 minutes of exercise per week.  The importance of healthy food choices with portion control discussed. Encouraged to start a food diary, count calories and to consider  joining a support group. Sample diet sheets offered. Goals set by the patient for the next several months.   Weight /BMI 01/29/2018 01/27/2018 09/30/2017  WEIGHT 219 lb 219 lb 221 lb  HEIGHT 5\' 10"  5\' 10"  5' 10.5"  BMI 31.42 kg/m2 31.42 kg/m2 31.26 kg/m2

## 2018-02-01 NOTE — Assessment & Plan Note (Signed)
Hyperlipidemia:Low fat diet discussed and encouraged.   Lipid Panel  Lab Results  Component Value Date   CHOL 131 09/03/2017   HDL 49 09/03/2017   LDLCALC 68 09/03/2017   TRIG 56 09/03/2017   CHOLHDL 2.7 09/03/2017  Controlled, no change in medication     

## 2018-02-01 NOTE — Assessment & Plan Note (Signed)
Inadequately treated, pt has not been compliant with Prescriber's direction and needs f/u. Advised to increase to 2 tabs daily with psych follow  up

## 2018-02-01 NOTE — Assessment & Plan Note (Signed)
Reports worsening fatigue, will check thyroid function, blood sugar level and anemia

## 2018-02-01 NOTE — Assessment & Plan Note (Signed)
Anemia with fatigue, if no specific deficiency found will recommend GI  evaluation

## 2018-02-04 LAB — COMPLETE METABOLIC PANEL WITH GFR
AG Ratio: 1.5 (calc) (ref 1.0–2.5)
ALKALINE PHOSPHATASE (APISO): 49 U/L (ref 40–115)
ALT: 14 U/L (ref 9–46)
AST: 23 U/L (ref 10–35)
Albumin: 4.1 g/dL (ref 3.6–5.1)
BILIRUBIN TOTAL: 0.5 mg/dL (ref 0.2–1.2)
BUN: 14 mg/dL (ref 7–25)
CHLORIDE: 105 mmol/L (ref 98–110)
CO2: 26 mmol/L (ref 20–32)
CREATININE: 0.81 mg/dL (ref 0.70–1.11)
Calcium: 8.9 mg/dL (ref 8.6–10.3)
GFR, Est African American: 97 mL/min/{1.73_m2} (ref >=60)
GFR, Est Non African American: 84 mL/min/{1.73_m2} (ref >=60)
GLUCOSE: 103 mg/dL — AB (ref 65–99)
Globulin: 2.8 g/dL (calc) (ref 1.9–3.7)
Potassium: 3.9 mmol/L (ref 3.5–5.3)
Sodium: 139 mmol/L (ref 135–146)
TOTAL PROTEIN: 6.9 g/dL (ref 6.1–8.1)

## 2018-02-04 LAB — CBC
HCT: 39.1 % (ref 38.5–50.0)
Hemoglobin: 12.8 g/dL — ABNORMAL LOW (ref 13.2–17.1)
MCH: 29.7 pg (ref 27.0–33.0)
MCHC: 32.7 g/dL (ref 32.0–36.0)
MCV: 90.7 fL (ref 80.0–100.0)
MPV: 10.1 fL (ref 7.5–12.5)
PLATELETS: 311 10*3/uL (ref 140–400)
RBC: 4.31 10*6/uL (ref 4.20–5.80)
RDW: 11.1 % (ref 11.0–15.0)
WBC: 4.9 10*3/uL (ref 3.8–10.8)

## 2018-02-04 LAB — VITAMIN B12: VITAMIN B 12: 1083 pg/mL (ref 200–1100)

## 2018-02-04 LAB — FOLATE: FOLATE: 22.3 ng/mL

## 2018-02-04 LAB — VITAMIN D 25 HYDROXY (VIT D DEFICIENCY, FRACTURES): VIT D 25 HYDROXY: 45 ng/mL (ref 30–100)

## 2018-02-04 LAB — IRON: Iron: 65 ug/dL (ref 50–180)

## 2018-02-04 LAB — FERRITIN: FERRITIN: 151 ng/mL (ref 24–380)

## 2018-02-04 LAB — HEMOGLOBIN A1C W/OUT EAG: Hgb A1c MFr Bld: 5.3 % of total Hgb (ref <5.7)

## 2018-02-04 LAB — TSH: TSH: 1.09 m[IU]/L (ref 0.40–4.50)

## 2018-02-05 ENCOUNTER — Other Ambulatory Visit: Payer: Self-pay | Admitting: Family Medicine

## 2018-02-05 ENCOUNTER — Encounter: Payer: Self-pay | Admitting: Internal Medicine

## 2018-02-05 DIAGNOSIS — D649 Anemia, unspecified: Secondary | ICD-10-CM

## 2018-02-05 NOTE — Progress Notes (Signed)
A,mb gastro

## 2018-03-03 ENCOUNTER — Other Ambulatory Visit: Payer: Self-pay | Admitting: Family Medicine

## 2018-03-04 ENCOUNTER — Observation Stay (HOSPITAL_COMMUNITY): Payer: Medicare Other

## 2018-03-04 ENCOUNTER — Encounter (HOSPITAL_COMMUNITY): Payer: Self-pay | Admitting: Emergency Medicine

## 2018-03-04 ENCOUNTER — Observation Stay (HOSPITAL_COMMUNITY)
Admission: EM | Admit: 2018-03-04 | Discharge: 2018-03-05 | Disposition: A | Discharge status: Home / Self Care | Payer: Medicare Other | Attending: Internal Medicine | Admitting: Internal Medicine

## 2018-03-04 ENCOUNTER — Ambulatory Visit (INDEPENDENT_AMBULATORY_CARE_PROVIDER_SITE_OTHER): Payer: Medicare Other | Admitting: Family Medicine

## 2018-03-04 ENCOUNTER — Encounter: Payer: Self-pay | Admitting: Family Medicine

## 2018-03-04 ENCOUNTER — Other Ambulatory Visit: Payer: Self-pay

## 2018-03-04 VITALS — BP 82/47 | HR 46 | Resp 12 | Ht 70.0 in | Wt 217.0 lb

## 2018-03-04 DIAGNOSIS — Z7982 Long term (current) use of aspirin: Secondary | ICD-10-CM | POA: Insufficient documentation

## 2018-03-04 DIAGNOSIS — J309 Allergic rhinitis, unspecified: Secondary | ICD-10-CM | POA: Diagnosis present

## 2018-03-04 DIAGNOSIS — R001 Bradycardia, unspecified: Secondary | ICD-10-CM

## 2018-03-04 DIAGNOSIS — Z87891 Personal history of nicotine dependence: Secondary | ICD-10-CM | POA: Insufficient documentation

## 2018-03-04 DIAGNOSIS — F418 Other specified anxiety disorders: Secondary | ICD-10-CM

## 2018-03-04 DIAGNOSIS — E669 Obesity, unspecified: Secondary | ICD-10-CM | POA: Diagnosis present

## 2018-03-04 DIAGNOSIS — J4530 Mild persistent asthma, uncomplicated: Secondary | ICD-10-CM | POA: Diagnosis present

## 2018-03-04 DIAGNOSIS — Z79899 Other long term (current) drug therapy: Secondary | ICD-10-CM | POA: Diagnosis not present

## 2018-03-04 DIAGNOSIS — I959 Hypotension, unspecified: Secondary | ICD-10-CM | POA: Diagnosis not present

## 2018-03-04 DIAGNOSIS — F324 Major depressive disorder, single episode, in partial remission: Secondary | ICD-10-CM | POA: Diagnosis present

## 2018-03-04 DIAGNOSIS — R531 Weakness: Secondary | ICD-10-CM

## 2018-03-04 DIAGNOSIS — E66811 Obesity, class 1: Secondary | ICD-10-CM | POA: Diagnosis present

## 2018-03-04 DIAGNOSIS — E785 Hyperlipidemia, unspecified: Secondary | ICD-10-CM | POA: Diagnosis not present

## 2018-03-04 DIAGNOSIS — R079 Chest pain, unspecified: Secondary | ICD-10-CM | POA: Diagnosis not present

## 2018-03-04 DIAGNOSIS — F322 Major depressive disorder, single episode, severe without psychotic features: Secondary | ICD-10-CM | POA: Diagnosis present

## 2018-03-04 DIAGNOSIS — J45909 Unspecified asthma, uncomplicated: Secondary | ICD-10-CM | POA: Diagnosis not present

## 2018-03-04 LAB — HEPATIC FUNCTION PANEL
ALT: 18 U/L (ref 0–44)
AST: 24 U/L (ref 15–41)
Albumin: 4 g/dL (ref 3.5–5.0)
Alkaline Phosphatase: 53 U/L (ref 38–126)
BILIRUBIN DIRECT: 0.1 mg/dL (ref 0.0–0.2)
BILIRUBIN TOTAL: 0.6 mg/dL (ref 0.3–1.2)
Indirect Bilirubin: 0.5 mg/dL (ref 0.3–0.9)
Total Protein: 7.4 g/dL (ref 6.5–8.1)

## 2018-03-04 LAB — BASIC METABOLIC PANEL
Anion gap: 5 (ref 5–15)
BUN: 16 mg/dL (ref 8–23)
CALCIUM: 9.2 mg/dL (ref 8.9–10.3)
CO2: 21 mmol/L — ABNORMAL LOW (ref 22–32)
CREATININE: 0.91 mg/dL (ref 0.61–1.24)
Chloride: 112 mmol/L — ABNORMAL HIGH (ref 98–111)
Glucose, Bld: 96 mg/dL (ref 70–99)
Potassium: 3.8 mmol/L (ref 3.5–5.1)
Sodium: 138 mmol/L (ref 135–145)

## 2018-03-04 LAB — CBC WITH DIFFERENTIAL/PLATELET
ABS IMMATURE GRANULOCYTES: 0.01 10*3/uL (ref 0.00–0.07)
BASOS PCT: 1 %
Basophils Absolute: 0.1 10*3/uL (ref 0.0–0.1)
EOS ABS: 0.4 10*3/uL (ref 0.0–0.5)
EOS PCT: 8 %
HCT: 39.3 % (ref 39.0–52.0)
HEMOGLOBIN: 12.2 g/dL — AB (ref 13.0–17.0)
Immature Granulocytes: 0 %
Lymphocytes Relative: 27 %
Lymphs Abs: 1.5 10*3/uL (ref 0.7–4.0)
MCH: 29.8 pg (ref 26.0–34.0)
MCHC: 31 g/dL (ref 30.0–36.0)
MCV: 95.9 fL (ref 80.0–100.0)
MONO ABS: 0.5 10*3/uL (ref 0.1–1.0)
Monocytes Relative: 8 %
NRBC: 0 % (ref 0.0–0.2)
Neutro Abs: 3 10*3/uL (ref 1.7–7.7)
Neutrophils Relative %: 56 %
PLATELETS: 290 10*3/uL (ref 150–400)
RBC: 4.1 MIL/uL — AB (ref 4.22–5.81)
RDW: 12.2 % (ref 11.5–15.5)
WBC: 5.3 10*3/uL (ref 4.0–10.5)

## 2018-03-04 LAB — TSH: TSH: 1.194 u[IU]/mL (ref 0.350–4.500)

## 2018-03-04 LAB — CK: CK TOTAL: 235 U/L (ref 49–397)

## 2018-03-04 LAB — I-STAT TROPONIN, ED: TROPONIN I, POC: 0.01 ng/mL (ref 0.00–0.08)

## 2018-03-04 LAB — TROPONIN I

## 2018-03-04 LAB — MAGNESIUM: Magnesium: 2.4 mg/dL (ref 1.7–2.4)

## 2018-03-04 MED ORDER — ALBUTEROL SULFATE (2.5 MG/3ML) 0.083% IN NEBU: 3.0000 mL | INHALATION_SOLUTION | RESPIRATORY_TRACT | Status: DC | PRN

## 2018-03-04 MED ORDER — HYDROCODONE-ACETAMINOPHEN 5-325 MG PO TABS
1.0000 | ORAL_TABLET | Freq: Once | ORAL | Status: AC
  Administered 2018-03-04: 1 via ORAL
  Filled 2018-03-04: qty 1

## 2018-03-04 MED ORDER — VENLAFAXINE HCL ER 75 MG PO CP24: 150.0000 mg | ORAL_CAPSULE | Freq: Every day | ORAL | Status: DC

## 2018-03-04 MED ORDER — VENLAFAXINE HCL ER 75 MG PO CP24
75.0000 mg | ORAL_CAPSULE | Freq: Every day | ORAL | Status: DC
  Administered 2018-03-05: 75 mg via ORAL
  Filled 2018-03-04: qty 1

## 2018-03-04 MED ORDER — MOMETASONE FURO-FORMOTEROL FUM 200-5 MCG/ACT IN AERO
2.0000 | INHALATION_SPRAY | Freq: Two times a day (BID) | RESPIRATORY_TRACT | Status: DC
  Administered 2018-03-04 – 2018-03-05 (×2): 2 via RESPIRATORY_TRACT
  Filled 2018-03-04: qty 8.8

## 2018-03-04 MED ORDER — VENLAFAXINE HCL ER 75 MG PO CP24: 75.0000 mg | ORAL_CAPSULE | Freq: Every day | ORAL | 0 refills | Status: DC

## 2018-03-04 MED ORDER — ASPIRIN EC 81 MG PO TBEC
81.0000 mg | DELAYED_RELEASE_TABLET | Freq: Every day | ORAL | Status: DC
  Administered 2018-03-04 – 2018-03-05 (×2): 81 mg via ORAL
  Filled 2018-03-04 (×2): qty 1

## 2018-03-04 MED ORDER — ACETAMINOPHEN 650 MG RE SUPP: 650.0000 mg | Freq: Four times a day (QID) | RECTAL | Status: DC | PRN

## 2018-03-04 MED ORDER — ONDANSETRON HCL 4 MG PO TABS: 4.0000 mg | ORAL_TABLET | Freq: Four times a day (QID) | ORAL | Status: DC | PRN

## 2018-03-04 MED ORDER — ATORVASTATIN CALCIUM 40 MG PO TABS
40.0000 mg | ORAL_TABLET | Freq: Every day | ORAL | Status: DC
  Administered 2018-03-04: 40 mg via ORAL
  Filled 2018-03-04: qty 1

## 2018-03-04 MED ORDER — ACETAMINOPHEN 325 MG PO TABS: 650.0000 mg | ORAL_TABLET | Freq: Four times a day (QID) | ORAL | Status: DC | PRN

## 2018-03-04 MED ORDER — ONDANSETRON HCL 4 MG/2ML IJ SOLN: 4.0000 mg | Freq: Four times a day (QID) | INTRAMUSCULAR | Status: DC | PRN

## 2018-03-04 MED ORDER — POLYETHYLENE GLYCOL 3350 17 G PO PACK: 17.0000 g | PACK | Freq: Every day | ORAL | Status: DC | PRN

## 2018-03-04 NOTE — ED Provider Notes (Signed)
[facility] EMERGENCY DEPARTMENT Provider Note   CSN: [id number] Arrival date & time: 03/04/18  1403     History   Chief Complaint Chief Complaint  Patient presents with  . Weakness    HPI Terry Love is a 82 y.o. male.  Complains of generalized weakness and lack of energy for the past 2 to 3 weeks.  He denies any chest pain.  Denies pain anywhere denies shortness of breath.  Nothing makes symptoms better or worse.  He feels that symptoms started when his Effexor dose was increased.  He was seen by Dr. Moshe Cipro earlier today in the office.  Blood pressure noted to be 82/47, consistent with hypotension.  Sent here for further evaluation.  HPI  Past Medical History:  Diagnosis Date  . Anxiety   . Bradycardia    Chronic   . Colon polyps   . Depression   . Facial tic   . Hearing loss   . Hyperlipidemia   . 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 Obesity   . Seasonal allergies     Patient Active Problem List   Diagnosis Date Noted  . Hypotension 03/04/2018  . Fatigue 02/01/2018  . Mild persistent asthma 12/06/2016  . Snoring 02/20/2016  . Carpal tunnel syndrome 08/27/2015  . Insomnia secondary to anxiety 06/19/2015  . Sinus bradycardia, chronic 11/14/2014  . Hyperpigmented skin lesion 11/14/2014  . Vitamin D deficiency 02/15/2014  . Anemia 01/25/2012  . Depression with anxiety 12/13/2010  . Wheezing 10/12/2010  . Allergic rhinitis 05/19/2010  . SMOKELESS TOBACCO ABUSE 10/26/2009  . Hearing loss 12/23/2007  . OSTEOPENIA 12/23/2007  . Hyperlipidemia LDL goal <100 09/08/2007  . Obesity (BMI 30.0-34.9) 09/08/2007    Past Surgical History:  Procedure Laterality Date  . carpal tunnel  release right  2006  . COLONOSCOPY    . COLONOSCOPY  01/25/2011   Dr. Rehman:Examination performed to cecum Pan colonic diverticulosis/2 small polyps ablated via cold biopsy from transverse colon/External hemorrhoids, benign polyps  . COLONOSCOPY N/A 06/29/2013   Procedure: COLONOSCOPY;  Surgeon: Daneil Dolin, MD;  Location: [facility];  Service: Endoscopy;  Laterality: N/A;  10:45  . EYE SURGERY     left eye cataracts         Home Medications    Prior to Admission medications   Medication Sig Start Date End Date Taking? Authorizing Provider  albuterol (PROVENTIL HFA;VENTOLIN HFA) 108 (90 Base) MCG/ACT inhaler Inhale 2 puffs into the lungs every 4 (four) hours as needed for wheezing or shortness of breath (cough, shortness of breath or wheezing.). 05/17/17   Fayrene Helper, MD  aspirin (BAYER ASPIRIN EC LOW DOSE) 81 MG EC tablet Take 81 mg by mouth daily.      [provider]  atorvastatin (LIPITOR) 40 MG tablet Take 1 tablet (40 mg total) by mouth at bedtime. 12/30/17   Fayrene Helper, MD  azelastine (ASTELIN) 0.1 % nasal spray Place 2 sprays into both nostrils 2 (two) times daily. Use in each nostril as directed 07/02/16   Fayrene Helper, MD  budesonide-formoterol (SYMBICORT) 160-4.5 MCG/ACT inhaler TAKE 2 PUFFS BY MOUTH TWICE A DAY 09/20/17   Mannam, Praveen, MD  calcium-vitamin D (OSCAL WITH D) 500-200 MG-UNIT tablet Take 1 tablet by mouth 2 (two) times daily. 10/01/16   Fayrene Helper, MD  Multiple Vitamin (MULTIVITAMIN) tablet Take 1 tablet by mouth daily. Unspecified frequency    [provider]  SILENOR 6 MG TABS  Take 1 tablet by mouth daily. 08/16/16   [provider]  venlafaxine XR (EFFEXOR-XR) 75 MG 24 hr capsule Take 1 capsule (75 mg total) by mouth daily with breakfast for 10 days. 03/04/18 03/14/18  Fayrene Helper, MD  vitamin C (ASCORBIC ACID) 500 MG tablet Take 500 mg by mouth daily.    [provider]  Vitamin D, Ergocalciferol, (DRISDOL) 1.25 MG (50000 UT) CAPS capsule TAKE ONE CAPSULE BY MOUTH A WEEK 03/03/18   Fayrene Helper, MD  Wheat Dextrin (BENEFIBER DRINK MIX PO) Take by mouth. Use as directed daily    [provider]    Family History Family History    Problem Relation Age of Onset  . Colon cancer Mother        diagnosed at age 42  . Aneurysm Father   . Aneurysm Brother   . Stroke Brother     Social History Social History   Tobacco Use  . Smoking status: Never Smoker  . Smokeless tobacco: Former Systems developer    Types: Chew  Substance Use Topics  . Alcohol use: No    Alcohol/week: 0.0 standard drinks  . Drug use: No     Allergies   Patient has no known allergies.   Review of Systems Review of Systems  Constitutional: Negative.   HENT: Negative.   Respiratory: Negative.   Cardiovascular: Positive for chest pain.       Syncope  Gastrointestinal: Negative.   Musculoskeletal: Negative.   Skin: Negative.   Allergic/Immunologic: Negative.   Neurological: Positive for weakness.       Generalized weakness  Psychiatric/Behavioral: Negative.   All other systems reviewed and are negative.    Physical Exam Updated Vital Signs BP (!) 102/55 (BP Location: Right Arm)   Pulse (!) 44   Temp (!) 97.5 F (36.4 C) (Oral)   Resp 14   Ht 5\' 10"  (1.778 m)   Wt 98.4 kg   SpO2 98%   BMI 31.14 kg/m   Physical Exam  Constitutional: He is oriented to person, place, and time. He appears well-developed and well-nourished. No distress.  HENT:  Head: Normocephalic and atraumatic.  Eyes: Pupils are equal, round, and reactive to light. Conjunctivae are normal.  Neck: Neck supple. No tracheal deviation present. No thyromegaly present.  Cardiovascular: Regular rhythm.  No murmur heard. Bradycardic, irregularly irregular  Pulmonary/Chest: Effort normal and breath sounds normal.  Abdominal: Soft. Bowel sounds are normal. He exhibits no distension. There is no tenderness.  Musculoskeletal: Normal range of motion. He exhibits no edema or tenderness.  Neurological: He is alert and oriented to person, place, and time. Coordination normal.  Skin: Skin is warm and dry. No rash noted.  Psychiatric: He has a normal mood and affect.  Nursing note  and vitals reviewed.    ED Treatments / Results  Labs (all labs ordered are listed, but only abnormal results are displayed) Labs Reviewed  BASIC METABOLIC PANEL  CBC WITH DIFFERENTIAL/PLATELET  I-STAT TROPONIN, ED    EKG EKG Interpretation  Date/Time:  Tuesday March 04 2018 14:30:03 EST Ventricular Rate:  39 PR Interval:    QRS Duration: 103 QT Interval:  442 QTC Calculation: 356 R Axis:   18 Text Interpretation:  Atrial fibrillation Nonspecific T abnormalities, lateral leads Since last tracing rate slower Confirmed by Orlie Dakin [phone]) on 03/04/2018 2:34:52 PM  A. fib new since last tracing Radiology No results found.  Procedures Procedures (including critical care time)  Medications Ordered in ED  Medications - No data to display  Results for orders placed or performed during the hospital encounter of 24/58/09  Basic metabolic panel  Result Value Ref Range   Sodium 138 135 - 145 mmol/L   Potassium 3.8 3.5 - 5.1 mmol/L   Chloride 112 (H) 98 - 111 mmol/L   CO2 21 (L) 22 - 32 mmol/L   Glucose, Bld 96 70 - 99 mg/dL   BUN 16 8 - 23 mg/dL   Creatinine, Ser 0.91 0.61 - 1.24 mg/dL   Calcium 9.2 8.9 - 10.3 mg/dL   GFR calc non Af Amer >60 >60 mL/min   GFR calc Af Amer >60 >60 mL/min   Anion gap 5 5 - 15  CBC with Differential/Platelet  Result Value Ref Range   WBC 5.3 4.0 - 10.5 K/uL   RBC 4.10 (L) 4.22 - 5.81 MIL/uL   Hemoglobin 12.2 (L) 13.0 - 17.0 g/dL   HCT 39.3 39.0 - 52.0 %   MCV 95.9 80.0 - 100.0 fL   MCH 29.8 26.0 - 34.0 pg   MCHC 31.0 30.0 - 36.0 g/dL   RDW 12.2 11.5 - 15.5 %   Platelets 290 150 - 400 K/uL   nRBC 0.0 0.0 - 0.2 %   Neutrophils Relative % 56 %   Neutro Abs 3.0 1.7 - 7.7 K/uL   Lymphocytes Relative 27 %   Lymphs Abs 1.5 0.7 - 4.0 K/uL   Monocytes Relative 8 %   Monocytes Absolute 0.5 0.1 - 1.0 K/uL   Eosinophils Relative 8 %   Eosinophils Absolute 0.4 0.0 - 0.5 K/uL   Basophils Relative 1 %   Basophils Absolute 0.1 0.0 -  0.1 K/uL   Immature Granulocytes 0 %   Abs Immature Granulocytes 0.01 0.00 - 0.07 K/uL  TSH  Result Value Ref Range   TSH 1.194 0.350 - 4.500 uIU/mL  I-stat troponin, ED  Result Value Ref Range   Troponin i, poc 0.01 0.00 - 0.08 ng/mL   Comment 3           No results found. Initial Impression / Assessment and Plan / ED Course  I have reviewed the triage vital signs and the nursing notes.  Pertinent labs & imaging results that were available during my care of the patient were reviewed by me and considered in my medical decision making (see chart for details).     Dr. Johnsie Cancel from cardiology service was consulted saw patient in the ED.  Recommends overnight monitoring to check for heart blocks, and other metabolic causes of weakness and bradycardia.  He feels that patient's underlying rhythm is sinus bradycardia.  I consulted Dr.Emokpoe hospitalist service will arrange for overnight stay.  Lab work is unremarkable Final Clinical Impressions(s) / ED Diagnoses  Dx #1 weakness #2 bradycardia Final diagnoses:  None   #3 hypotension ED Discharge Orders    None       Orlie Dakin, MD 03/04/18 1700

## 2018-03-04 NOTE — Patient Instructions (Addendum)
You need to go to the ED for further evaluation as your blood pressure is very low and you feel exteremly weak and tired.  Your heart rate remains slow, and is around the rate that it`normally is  I believe that you will feel better once your blood pressure improves  I have prescribed 2 weeks only of effexor 75 mg daily, start taking this tomorrow , or whenever you get home , in place of the effexor 150 mg capsule if approved by your  Psychiatrist

## 2018-03-04 NOTE — Consult Note (Signed)
Cardiology Consultation:   Patient ID: Terry Love MRN: [id number]; DOB: [birth year]  Admit date: 03/04/2018 Date of Consult: 03/04/2018  Primary Care Provider: Fayrene Helper, MD Primary Cardiologist: Kate Sable, MD   Primary Electrophysiologist:  None     Patient Profile:   Terry Love is a 82 y.o. male with a hx of bradycardia  who is being seen today for the evaluation of bradycardia and weakness at the request of [place].  History of Present Illness:   Terry Love 82 y.o. with chronic bradycardia. HR usually in the low 50's Seen in primary Dr Simpson's office today and complained of fatigue and weakness last few weeks. BP thought to be low in office and HR 40's sent to ER He denies palpitations syncope. Not on any AV nodal blocking drugs. Effexor dose recently increased. Troponin, thyroid, Hct and K/Cr all normal Seen by Dr Bronson Ing for same in June of this year. Previous event monitor with HR 50 or higher no AV block Has had normal myovue in 2017 no chest pains Sleep study with no OSA. He is able to do ADL's, retired and does work around house and yard work. No dizziness Some dyspnea that he uses albuterol inhaler for . In ER when sleeping HR as low as 35 No AV block SB occasional narrow complex junctional escape He takes statin for HLD  In June PPM not thought to be indicated   Past Medical History:  Diagnosis Date  . Anxiety   . Bradycardia    Chronic   . Colon polyps   . Depression   . Facial tic   . Hearing loss   . Hyperlipidemia   . 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 Obesity   . Seasonal allergies     Past Surgical History:  Procedure Laterality Date  . carpal tunnel  release right  2006  . COLONOSCOPY    . COLONOSCOPY  01/25/2011   Dr. Rehman:Examination performed to cecum Pan colonic diverticulosis/2 small polyps ablated via cold biopsy from transverse colon/External hemorrhoids, benign polyps  . COLONOSCOPY N/A  06/29/2013   Procedure: COLONOSCOPY;  Surgeon: Daneil Dolin, MD;  Location: [facility];  Service: Endoscopy;  Laterality: N/A;  10:45  . EYE SURGERY     left eye cataracts      Home Medications:  Prior to Admission medications   Medication Sig Start Date End Date Taking? Authorizing Provider  acetaZOLAMIDE (DIAMOX) 250 MG tablet Take 250 mg by mouth 3 (three) times daily.   Yes [provider]  albuterol (PROVENTIL HFA;VENTOLIN HFA) 108 (90 Base) MCG/ACT inhaler Inhale 2 puffs into the lungs every 4 (four) hours as needed for wheezing or shortness of breath (cough, shortness of breath or wheezing.). 05/17/17  Yes Fayrene Helper, MD  aspirin (BAYER ASPIRIN EC LOW DOSE) 81 MG EC tablet Take 81 mg by mouth daily.     Yes [provider]  atorvastatin (LIPITOR) 40 MG tablet Take 1 tablet (40 mg total) by mouth at bedtime. 12/30/17  Yes Fayrene Helper, MD  azelastine (ASTELIN) 0.1 % nasal spray Place 2 sprays into both nostrils 2 (two) times daily. Use in each nostril as directed 07/02/16  Yes Fayrene Helper, MD  budesonide-formoterol (SYMBICORT) 160-4.5 MCG/ACT inhaler TAKE 2 PUFFS BY MOUTH TWICE A DAY 09/20/17  Yes Mannam, Praveen, MD  calcium-vitamin D (OSCAL WITH D) 500-200 MG-UNIT tablet Take 1 tablet by mouth 2 (two) times daily. 10/01/16  Yes  Fayrene Helper, MD  Multiple Vitamin (MULTIVITAMIN) tablet Take 1 tablet by mouth daily. Unspecified frequency   Yes [provider]  SILENOR 6 MG TABS Take 1 tablet by mouth daily. 08/16/16  Yes [provider]  venlafaxine XR (EFFEXOR-XR) 150 MG 24 hr capsule Take 150 mg by mouth daily with breakfast.   Yes [provider]  vitamin C (ASCORBIC ACID) 500 MG tablet Take 500 mg by mouth daily.   Yes [provider]  Wheat Dextrin (BENEFIBER DRINK MIX PO) Take by mouth. Use as directed daily   Yes [provider]  Vitamin D, Ergocalciferol, (DRISDOL) 1.25 MG (50000 UT) CAPS  capsule TAKE ONE CAPSULE BY MOUTH A WEEK 03/03/18   Fayrene Helper, MD    Inpatient Medications: Scheduled Meds:  Continuous Infusions:  PRN Meds:   Allergies:   No Known Allergies  Social History:   Social History   Socioeconomic History  . Marital status: Married    Spouse name: Ceasar Lund   . Number of children: 0  . Years of education: 75  . Highest education level: 12th grade  Occupational History  . Occupation: retired  Scientific laboratory technician  . Financial resource strain: Not very hard  . Food insecurity:    Worry: Never true    Inability: Never true  . Transportation needs:    Medical: No    Non-medical: No  Tobacco Use  . Smoking status: Never Smoker  . Smokeless tobacco: Former Systems developer    Types: Chew  Substance and Sexual Activity  . Alcohol use: No    Alcohol/week: 0.0 standard drinks  . Drug use: No  . Sexual activity: Not Currently  Lifestyle  . Physical activity:    Days per week: 3 days    Minutes per session: 20 min  . Stress: Not at all  Relationships  . Social connections:    Talks on phone: Once a week    Gets together: Once a week    Attends religious service: 1 to 4 times per year    Active member of club or organization: Yes    Attends meetings of clubs or organizations: 1 to 4 times per year    Relationship status: Married  . Intimate partner violence:    Fear of current or ex partner: No    Emotionally abused: No    Physically abused: No    Forced sexual activity: No  Other Topics Concern  . Not on file  Social History Narrative  . Not on file    Family History:    Family History  Problem Relation Age of Onset  . Colon cancer Mother        diagnosed at age 16  . Aneurysm Father   . Aneurysm Brother   . Stroke Brother      ROS:  Please see the history of present illness.   All other ROS reviewed and negative.     Physical Exam/Data:   Vitals:   03/04/18 1445 03/04/18 1500 03/04/18 1515 03/04/18 1530  BP:  (!) 103/54  (!)  116/58  Pulse: (!) 44     Resp:  18 17 17   Temp:      TempSrc:      SpO2:      Weight:      Height:       No intake or output data in the 24 hours ending 03/04/18 1624 Filed Weights   03/04/18 1411  Weight: 98.4 kg   Body  mass index is 31.14 kg/m.  General:  Overweight black male in no distress  HEENT: hearing aids  Lymph: no adenopathy Neck: no JVD Endocrine:  No thryomegaly Vascular: No carotid bruits; FA pulses 2+ bilaterally without bruits  Cardiac:  normal S1, S2; RRR; distant heart sounds soft SEM  Lungs:  clear to auscultation bilaterally, no wheezing, rhonchi or rales  Abd: soft, nontender, no hepatomegaly  Ext: no edema Musculoskeletal:  No deformities, BUE and BLE strength normal and equal Skin: warm and dry  Neuro:  CNs 2-12 intact, no focal abnormalities noted Psych:  Normal affect   EKG:  The EKG was personally reviewed and demonstrates:  SB rate 39 no AV block or acute ischemic changes Incorrectly read by computer as afib   Telemetry:  Telemetry was personally reviewed and demonstrates:  SB rates 35-55 no PAF and no AV block  Relevant CV Studies: TTE 2017 EF 60-65% mild AS mean gradient 6 mmHg   Laboratory Data:  Chemistry Recent Labs  Lab 03/04/18 1443  NA 138  K 3.8  CL 112*  CO2 21*  GLUCOSE 96  BUN 16  CREATININE 0.91  CALCIUM 9.2  GFRNONAA >60  GFRAA >60  ANIONGAP 5    No results for input(s): PROT, ALBUMIN, AST, ALT, ALKPHOS, BILITOT in the last 168 hours. Hematology Recent Labs  Lab 03/04/18 1443  WBC 5.3  RBC 4.10*  HGB 12.2*  HCT 39.3  MCV 95.9  MCH 29.8  MCHC 31.0  RDW 12.2  PLT 290   Cardiac EnzymesNo results for input(s): TROPONINI in the last 168 hours.  Recent Labs  Lab 03/04/18 1449  TROPIPOC 0.01    BNPNo results for input(s): BNP, PROBNP in the last 168 hours.  DDimer No results for input(s): DDIMER in the last 168 hours.  Radiology/Studies:  No results found.  Assessment and Plan:   1. Bradycardia:   Seems slower than reported in past with documented rates in ER 35 bpm. No reversible causes noted but no AV block. Admit Watch on telemetry If HR goes up with ambulation and no heart block or symptomatic bradycardia can consider d/c with 30 day event monitor and f/u with Dr Raliegh Ip and EP If he has chronotropic incompetence with his fatigue may benefit from pacing. Would repeat TTE to make sure fatigue not from low EF 2. HLD:  Continue statin check LFTls and CPK make sure no side effects  3. Asthma:  Small airway disease by PFTls no active wheezing PRN albuterol 4. AS/AR 2017 both mild soft murmur on exam see above TTE       For questions or updates, please contact [place] Please consult [email or web address] for contact info under     Signed, Jenkins Rouge, MD  03/04/2018 4:24 PM

## 2018-03-04 NOTE — Progress Notes (Signed)
   Terry Love     MRN: [id number]      DOB: 1936/03/26   HPI Terry Love is here for follow up and re-evaluation of chronic medical conditions, medication management and review of any available recent lab and radiology data.  3 week h/o increased faigue and sleepiness with medication I dose change to effexor 150 mg dailly. Treated by Psych in [place] through the [place] Denies recent fever or chills. Denies sinus pressure, nasal congestion, ear pain or sore throat. Denies chest congestion, productive cough or wheezing. Denies chest pains, palpitations and leg swelling Denies abdominal pain, nausea, vomiting,diarrhea or constipation.   Denies dysuria, frequency, hesitancy or incontinence. Denies joint pain, swelling and limitation in mobility. Denies headaches, seizures, numbness, or tingling.  Denies skin break down or rash.   PE  BP (!) 82/47 (BP Location: Right Arm, Patient Position: Sitting, Cuff Size: Large)   Pulse (!) 46   Resp 12   Ht 5\' 10"  (1.778 m)   Wt 217 lb (98.4 kg)   SpO2 96% Comment: room air  BMI 31.14 kg/m   Patient alert though appears drowsy and oriented and in no cardiopulmonary distress.  HEENT: No facial asymmetry, EOMI,   oropharynx pink and moist.  Neck supple no JVD, no mass.  Chest: Clear to auscultation bilaterally.  CVS: S1, S2 no murmurs, no S3.Regular rate.  ABD: Soft non tender.   Ext: No edema  MS: Adequate ROM spine, shoulders, hips and knees.  Skin: Intact, no ulcerations or rash noted.  Psych: Good eye contact, flat  affect. Memory intact not anxious or depressed appearing.  CNS: CN 2-12 intact, power,  normal throughout.no focal deficits noted.   Assessment & Plan  Hypotension Symptomatic hypotension with increased fatigue and sleepiness x 3 weeks. Chronic bradycardia unchanged , however systolic pressure noted to be extremely low. Spouse states this is associated with the change in medication dose , uncertain if this is  the case but I will temporarily reduce dose of effexor until he is re revaluated by Psychiatry in [place] in next 2 weeks Based on low SBP, chronic bradycardia  and history, needs ED evaluation and probable admission. Discussed with ED Physician and patient taken by his wife to the ED  Depression with anxiety Noted increased sedation with higher dose of effexor through Psych in the past 3 weeks, will temporarily lower dose until he is re evaluated by Psych in the next 2 weeks  Bradycardia Chronic bradycardia, current HR at the visit within his normal range

## 2018-03-04 NOTE — H&P (Addendum)
History and Physical    Terry Love MLY:[id number] DOB: 05/02/1935 DOA: 03/04/2018  PCP: Terry Helper, MD   Patient coming from: Home  I have personally briefly reviewed patient's old medical records in [place]  Chief Complaint: Generalized weakness, hypotension  HPI: Terry Love is a 82 y.o. male with medical history significant for PTSD, bradycardia, depression and anxiety who presented to the ED with complaints of 2 weeks of increasing generalized weakness, especially with little activity-he has to stop to rest.  In the morning he feels his energy is low and as the day progresses he feels drained.  He denies dizziness.  No chest pain.  He has maintained good p.o. intake without nausea vomiting or loose stools.  Patient saw his psychiatrist at the [place] 2 weeks ago just before onset of symptoms, he reported PTSD symptoms, so he is venlafaxine was increased from 75 to 150 mg daily, he attributes his increasing weakness to the increased dose. Since saw his PCP Dr. Moshe Love today, he had to be bradycardic heart rate 49 with hypotension systolic in the 81E.  Patient was sent to the ED.  ED Course: Bradycardic 43-44, blood pressure initially 102- 166  Without intervention.  Mildly low bicarb 21 otherwise unremarkable CBC CMP.  Normal TSH 1.19.  Normal troponin i-STAT troponin- 0.01.  EKG-read as atrial fibrillation with rate of 39, QTC 356. But Dr. Johnsie Love was consulted in the ED-read EKG as sinus bradycardia no AV block and incorrectly read by computer as A. Fib, also recommended admission overnight here at [facility].  Review of Systems: As per HPI all other systems reviewed and negative.  Past Medical History:  Diagnosis Date  . Anxiety   . Bradycardia    Chronic   . Colon polyps   . Depression   . Facial tic   . Hearing loss   . Hyperlipidemia   . 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 Obesity   . Seasonal allergies     Past Surgical History:    Procedure Laterality Date  . carpal tunnel  release right  2006  . COLONOSCOPY    . COLONOSCOPY  01/25/2011   Terry Love:Examination performed to cecum Pan colonic diverticulosis/2 small polyps ablated via cold biopsy from transverse colon/External hemorrhoids, benign polyps  . COLONOSCOPY N/A 06/29/2013   Procedure: COLONOSCOPY;  Surgeon: Terry Dolin, MD;  Location: [facility];  Service: Endoscopy;  Laterality: N/A;  10:45  . EYE SURGERY     left eye cataracts     reports that he has never smoked. He quit smokeless tobacco use about 1 years ago.  His smokeless tobacco use included chew. He reports that he does not drink alcohol or use drugs.  No Known Allergies  Family History  Problem Relation Age of Onset  . Colon cancer Mother        diagnosed at age 59  . Aneurysm Father   . Aneurysm Brother   . Stroke Brother     Prior to Admission medications   Medication Sig Start Date End Date Taking? Authorizing Provider  acetaZOLAMIDE (DIAMOX) 250 MG tablet Take 250 mg by mouth 3 (three) times daily.   Yes [provider]  albuterol (PROVENTIL HFA;VENTOLIN HFA) 108 (90 Base) MCG/ACT inhaler Inhale 2 puffs into the lungs every 4 (four) hours as needed for wheezing or shortness of breath (cough, shortness of breath or wheezing.). 05/17/17  Yes Terry Helper, MD  aspirin (BAYER ASPIRIN  EC LOW DOSE) 81 MG EC tablet Take 81 mg by mouth daily.     Yes [provider]  atorvastatin (LIPITOR) 40 MG tablet Take 1 tablet (40 mg total) by mouth at bedtime. 12/30/17  Yes Terry Helper, MD  azelastine (ASTELIN) 0.1 % nasal spray Place 2 sprays into both nostrils 2 (two) times daily. Use in each nostril as directed 07/02/16  Yes Terry Helper, MD  budesonide-formoterol (SYMBICORT) 160-4.5 MCG/ACT inhaler TAKE 2 PUFFS BY MOUTH TWICE A DAY 09/20/17  Yes Terry, Praveen, MD  calcium-vitamin D (OSCAL WITH D) 500-200 MG-UNIT tablet Take 1 tablet by mouth 2 (two) times  daily. 10/01/16  Yes Terry Helper, MD  Multiple Vitamin (MULTIVITAMIN) tablet Take 1 tablet by mouth daily. Unspecified frequency   Yes [provider]  SILENOR 6 MG TABS Take 1 tablet by mouth daily. 08/16/16  Yes [provider]  venlafaxine XR (EFFEXOR-XR) 150 MG 24 hr capsule Take 150 mg by mouth daily with breakfast.   Yes [provider]  vitamin C (ASCORBIC ACID) 500 MG tablet Take 500 mg by mouth daily.   Yes [provider]  Wheat Dextrin (BENEFIBER DRINK MIX PO) Take by mouth. Use as directed daily   Yes [provider]  Vitamin D, Ergocalciferol, (DRISDOL) 1.25 MG (50000 UT) CAPS capsule TAKE ONE CAPSULE BY MOUTH A WEEK 03/03/18   Terry Helper, MD    Physical Exam: Vitals:   03/04/18 1445 03/04/18 1500 03/04/18 1515 03/04/18 1530  BP:  (!) 103/54  (!) 116/58  Pulse: (!) 44     Resp:  18 17 17   Temp:      TempSrc:      SpO2:      Weight:      Height:        Constitutional: NAD, calm, comfortable Vitals:   03/04/18 1445 03/04/18 1500 03/04/18 1515 03/04/18 1530  BP:  (!) 103/54  (!) 116/58  Pulse: (!) 44     Resp:  18 17 17   Temp:      TempSrc:      SpO2:      Weight:      Height:       Eyes: PERRL, lids and conjunctivae normal ENMT: Mucous membranes are moist. Posterior pharynx clear of any exudate or lesions.Normal dentition.  Neck: normal, supple, no masses, no thyromegaly Respiratory: clear to auscultation bilaterally, no wheezing, no crackles. Normal respiratory effort. No accessory muscle use.  Cardiovascular: Bradycardic, at the time of my evaluation heart rate momentarily dropped to 38 while patient is awake, but stays mostly in the 40s. No murmurs / rubs / gallops. No extremity edema. 2+ pedal pulses. Abdomen: no tenderness, no masses palpated. No hepatosplenomegaly. Bowel sounds positive.  Musculoskeletal: no clubbing / cyanosis. No joint deformity upper and lower extremities. Good ROM, no  contractures. Normal muscle tone.  Skin: no rashes, lesions, ulcers. No induration Neurologic: CN 2-12 grossly intact. Strength 5/5 in all 4.  Psychiatric: Normal judgment and insight. Alert and oriented x 3. Normal mood.   Labs on Admission: I have personally reviewed following labs and imaging studies  CBC: Recent Labs  Lab 03/04/18 1443  WBC 5.3  NEUTROABS 3.0  HGB 12.2*  HCT 39.3  MCV 95.9  PLT 094   Basic Metabolic Panel: Recent Labs  Lab 03/04/18 1443  NA 138  K 3.8  CL 112*  CO2 21*  GLUCOSE 96  BUN 16  CREATININE 0.91  CALCIUM  9.2   Thyroid Function Tests: Recent Labs    03/04/18 1443  TSH 1.194    Radiological Exams on Admission: Dg Chest 2 View  Result Date: 03/04/2018 CLINICAL DATA:  Bradycardia EXAM: CHEST - 2 VIEW COMPARISON:  06/24/2016 FINDINGS: Linear atelectasis or scar at the left base. No pleural effusion. Stable slightly enlarged cardiomediastinal silhouette. No pneumothorax. IMPRESSION: No active cardiopulmonary disease. Linear atelectasis or scarring at the left base. Electronically Signed   By: Donavan Foil M.D.   On: 03/04/2018 18:41    EKG: Independently reviewed.  Heart rate 39.  Irregular.  P waves present though not very distinct.  PR interval appears mildly prolonged  Assessment/Plan Active Problems:   Bradycardia  Generalized weakness- ?symptomatic bradycardia.  Unremarkable CBC CMP. Normal TSH.  Stat troponin negative.  EKG-bradycardia heart rate 39, P waves present, PR interval appears mildly prolonged.  No infectious etiology at this time.  Negative sleep study in the recent past. Adverse effect of venlafaxine-tachycardia. Cardiology consulted in the ED, Dr. Johnsie Love recommended observation here overnight, consider DC with 30-day event monitor, low up with Dr. Raliegh Ip and EP if chronotropic incompetence with his first severe fatigue may benefit from pacing, repeat TTE. ( see detailed note) - LFTs, CPK-within normal limits -Follow-up  echo -Check-magnesium -normal 2.4  - Trend Troponins  - two-view chest x-ray-acute abnormality -Consider ambulation patient and checking heart rates -N.p.o. midnight pending cardiology re-evaluation.  Bradycardia-  Follows with dr. Bronson Ing, for exertional dyspnea and fatigue. Last visit 09/30/17. Lowest heart rate was 50.   normal nuclear stress test on 03/27/16, LVEF 70%. Has had sleep study as part of work up- no evidence of sleep apnea.  Plan was to consider pacemaker placement if symptoms progress or heart rate should drop. - plan per above  PTSD, anxiety, depression-  - Cont home Venlafaxine at reduced dose 75mg  daily, if per chance increased dose of 150mg  was causing weakness.  Glaucoma-continue home acetazolamide and eyedrops.  I do not see any sympatholytic/sympathomimetic agents included. Right eye surgery 1 week ago.    DVT prophylaxis: Scds for now pending cardiac evaluation in a.m. Code Status: Full Family Communication: spouse at bedside Disposition Plan: per rounding team Consults called: Cardiology Admission status: obs, tele   Bethena Roys MD [facility] Pager [phone] From 3PM- 11PM.  Otherwise please contact night-coverage [email or web address] Password [facility]  03/04/2018, 7:48 PM

## 2018-03-04 NOTE — ED Triage Notes (Signed)
Patient sent here from Dr Moshe Cipro for low blood pressure. Patient complaining of generalized weakness x 2 weeks. States the doctor at the [place] doubled his dose of venlafaxine 2 weeks ago.

## 2018-03-05 ENCOUNTER — Observation Stay (HOSPITAL_BASED_OUTPATIENT_CLINIC_OR_DEPARTMENT_OTHER)
Admit: 2018-03-05 | Discharge: 2018-03-05 | Disposition: A | Discharge status: Home / Self Care | Payer: Medicare Other | Attending: Student | Admitting: Student

## 2018-03-05 ENCOUNTER — Encounter: Payer: Self-pay | Admitting: Family Medicine

## 2018-03-05 ENCOUNTER — Observation Stay (HOSPITAL_BASED_OUTPATIENT_CLINIC_OR_DEPARTMENT_OTHER): Payer: Medicare Other

## 2018-03-05 DIAGNOSIS — R0609 Other forms of dyspnea: Secondary | ICD-10-CM | POA: Diagnosis not present

## 2018-03-05 DIAGNOSIS — R531 Weakness: Secondary | ICD-10-CM

## 2018-03-05 DIAGNOSIS — R001 Bradycardia, unspecified: Secondary | ICD-10-CM | POA: Diagnosis not present

## 2018-03-05 DIAGNOSIS — I351 Nonrheumatic aortic (valve) insufficiency: Secondary | ICD-10-CM | POA: Diagnosis not present

## 2018-03-05 DIAGNOSIS — E669 Obesity, unspecified: Secondary | ICD-10-CM | POA: Diagnosis not present

## 2018-03-05 DIAGNOSIS — I959 Hypotension, unspecified: Secondary | ICD-10-CM

## 2018-03-05 LAB — EXERCISE TOLERANCE TEST
CHL CUP MPHR: 140 {beats}/min
CHL CUP RESTING HR STRESS: 43 {beats}/min
CSEPHR: 57 %
CSEPPHR: 81 {beats}/min
Estimated workload: 4 METS
Exercise duration (min): 4 min
Exercise duration (sec): 38 s
RPE: 15

## 2018-03-05 LAB — ECHOCARDIOGRAM COMPLETE
Height: 70 in
WEIGHTICAEL: 3472 [oz_av]

## 2018-03-05 LAB — TROPONIN I: Troponin I: <0.03 ng/mL (ref <0.03)

## 2018-03-05 NOTE — Progress Notes (Signed)
Patient's IV catheter removed and intact. Pt's IV site clean dry and intact. Discharge instructions including medications were reviewed and discussed with patient. All questions were answered and no further questions at this time. Pt escorted by nurse tech

## 2018-03-05 NOTE — Discharge Summary (Signed)
Physician Discharge Summary  Terry Love RKY:[id number] DOB: 22-Jul-1935 DOA: 03/04/2018  PCP: Fayrene Helper, MD  Admit date: 03/04/2018 Discharge date: 03/05/2018  Admitted From: Home Disposition: Home  Recommendations for Outpatient Follow-up:  1. Follow up with PCP in 1 week. 2. Outpatient appointment with EP Dr. Lovena Le on 12/11 at 2:30 PM  [place]: None Equipment/Devices: None  Discharge Condition: Fair CODE STATUS: Full code Diet recommendation: Regular    Discharge Diagnoses:  Principal problem Sinus bradycardia  Active Problems:   Obesity (BMI 30.0-34.9)   Allergic rhinitis   Depression with anxiety   Mild persistent asthma   Hypotension   Brief narrative/HPI Please refer to admission H&P for details, in brief, 82 year old male with history of PTSD, sinus bradycardia, anxiety and depression, obesity who presented to the ED with 2-week duration of generalized weakness worsened on exertion.  On the morning of admission he reported very low energy.  Denies any dizziness, chest pain, shortness of breath or palpitations.  Denied any nausea, vomiting, diarrhea or weight loss.  He was seen by a psychiatrist at the [place] 2 weeks ago and given his PTSD symptoms his Effexor dose was increased to 150 mg daily. Patient saw his PCP and was found to be bradycardic with heart rate of 49 and systolic blood pressure in the 80s.  It was then sent to the ED. In the ED his heart rate was in the low 40s soft blood pressure.  Blood work including TSH was unremarkable.  EKG showed A. fib at a rate of 39 with QTC of 350s.  Seen by cardiology and recommended observation on telemetry.   Hospital course Symptomatic bradycardia Appears to have chronic bradycardia with increasing weakness and fatigue for the past 2 weeks.  Patient not on any AV nodal blocking agents.  TSH normal.  He was found to be hypotensive on presentation with blood pressure of 80s/40s and subsequently been stable.   Heart rate maintained in the 40s. Patient reports that he is feeling little better this morning.  On telemetry he has first-degree AV block but no findings of pauses or high grade block.  2D echo done showed normal EF of 60-65% with no wall motion abnormality, grade 1 diastolic dysfunction and LVH. Exercise tolerance test done today showed no ST segment changes, arrhythmias but with chronotropic incompetence with patient achieving only 57% of maximum heart rate.  Cardiology have arranged outpatient follow-up with EP in 2 weeks to discuss pacemaker options, not needed as outpatient. Patient clinically stable and can be discharged home  Hypotension Blood pressure stable after the initial reading.  Patient on doxepin which can cause both hyper and hypotension and blood pressure should be closely monitored as outpatient.  Depression with PTSD Continue Effexor.  Dose recently increased as outpatient and is not associated with bradycardia or hypotension.  Mild persistent asthma Continue home inhaler  Procedure: 2D echo, exercise tolerance test  Consult: Cardiology  Family communication: Wife at bedside Disposition: Home     Discharge Instructions   Allergies as of 03/05/2018   No Known Allergies     Medication List    TAKE these medications   acetaZOLAMIDE 250 MG tablet Commonly known as:  DIAMOX Take 250 mg by mouth 3 (three) times daily.   albuterol 108 (90 Base) MCG/ACT inhaler Commonly known as:  PROVENTIL HFA;VENTOLIN HFA Inhale 2 puffs into the lungs every 4 (four) hours as needed for wheezing or shortness of breath (cough, shortness of breath or wheezing.).  atorvastatin 40 MG tablet Commonly known as:  LIPITOR Take 1 tablet (40 mg total) by mouth at bedtime.   azelastine 0.1 % nasal spray Commonly known as:  ASTELIN Place 2 sprays into both nostrils 2 (two) times daily. Use in each nostril as directed   BAYER ASPIRIN EC LOW DOSE 81 MG EC tablet Generic drug:   aspirin Take 81 mg by mouth daily.   BENEFIBER DRINK MIX PO Take by mouth. Use as directed daily   budesonide-formoterol 160-4.5 MCG/ACT inhaler Commonly known as:  SYMBICORT TAKE 2 PUFFS BY MOUTH TWICE A DAY   calcium-vitamin D 500-200 MG-UNIT tablet Commonly known as:  OSCAL WITH D Take 1 tablet by mouth 2 (two) times daily.   multivitamin tablet Take 1 tablet by mouth daily. Unspecified frequency   SILENOR 6 MG Tabs Generic drug:  Doxepin HCl Take 1 tablet by mouth daily.   venlafaxine XR 150 MG 24 hr capsule Commonly known as:  EFFEXOR-XR Take 150 mg by mouth daily with breakfast.   vitamin C 500 MG tablet Commonly known as:  ASCORBIC ACID Take 500 mg by mouth daily.   Vitamin D (Ergocalciferol) 1.25 MG (50000 UT) Caps capsule Commonly known as:  DRISDOL TAKE ONE CAPSULE BY MOUTH A WEEK      Follow-up Information    Evans Lance, MD Follow up on 03/19/2018.   Specialty:  Cardiology Why:  Cardiology Follow-up with Dr. Lovena Le (Electrophysiology) on 03/19/2018 at 2:30 PM.  Contact information: [address] [place] [address] [phone]          No Known Allergies   Procedures/Studies: Dg Chest 2 View  Result Date: 03/04/2018 CLINICAL DATA:  Bradycardia EXAM: CHEST - 2 VIEW COMPARISON:  06/24/2016 FINDINGS: Linear atelectasis or scar at the left base. No pleural effusion. Stable slightly enlarged cardiomediastinal silhouette. No pneumothorax. IMPRESSION: No active cardiopulmonary disease. Linear atelectasis or scarring at the left base. Electronically Signed   By: Donavan Foil M.D.   On: 03/04/2018 18:41    2D echo Study Conclusions  - Left ventricle: The cavity size was normal. Wall thickness was   increased increased in a pattern of mild to moderate LVH.   Systolic function was normal. The estimated ejection fraction was   in the range of 60% to 65%. Wall motion was normal; there were no   regional wall motion  abnormalities. Doppler parameters are   consistent with abnormal left ventricular relaxation (grade 1   diastolic dysfunction). - Aortic valve: Moderately calcified annulus. Trileaflet; mildly   calcified leaflets. There was mild regurgitation. - Mitral valve: Mildly calcified annulus. There was trivial   regurgitation. - Right atrium: Central venous pressure (est): 3 mm Hg. - Atrial septum: No defect or patent foramen ovale was identified. - Tricuspid valve: There was trivial regurgitation. - Pulmonary arteries: Systolic pressure could not be accurately   estimated. - Pericardium, extracardiac: There was no pericardial effusion.  Exercise tolerance test There was no ST segment deviation noted during stress.  Arrhythmias during stress: none.  Arrhythmias during recovery: none.  There were no significant arrhythmias noted during the test.  ECG was interpretable.  Nondiagnostic exercise treadmill test for ischemia due to blunted heart rate response, however consistent with chronotropic incompetence with patient achieving only 57% of MPHR. Test discontinued due to patient fatigue and shortness of breath.  Subjective: Reports feeling tired but much better since when he came in yesterday.  Noted for heart rate in the 40s on  the monitor with first-degree AV block, no other findings.  Discharge Exam: Vitals:   03/05/18 0600 03/05/18 0856  BP: 130/66   Pulse: (!) 47   Resp: 18   Temp: 98.4 F (36.9 C)   SpO2: 98% 96%   Vitals:   03/04/18 1943 03/04/18 2200 03/05/18 0600 03/05/18 0856  BP:  130/68 130/66   Pulse:  (!) 46 (!) 47   Resp:  17 18   Temp:  97.9 F (36.6 C) 98.4 F (36.9 C)   TempSrc:  Oral Oral   SpO2: 97% 98% 98% 96%  Weight:      Height:        General: Elderly obese male, not in distress HEENT: No pallor, moist mucosa, supple neck Chest: Clear bilaterally CVs: S1-S2 bradycardic, no murmurs rub or gallop GI: Soft, nondistended, nontender Musculoskeletal:  Warm, no edema    The results of significant diagnostics from this hospitalization (including imaging, microbiology, ancillary and laboratory) are listed below for reference.     Microbiology: No results found for this or any previous visit (from the past 240 hour(s)).   Labs: BNP (last 3 results) No results for input(s): BNP in the last 8760 hours. Basic Metabolic Panel: Recent Labs  Lab 03/04/18 1443  NA 138  K 3.8  CL 112*  CO2 21*  GLUCOSE 96  BUN 16  CREATININE 0.91  CALCIUM 9.2  MG 2.4   Liver Function Tests: Recent Labs  Lab 03/04/18 1443  AST 24  ALT 18  ALKPHOS 53  BILITOT 0.6  PROT 7.4  ALBUMIN 4.0   No results for input(s): LIPASE, AMYLASE in the last 168 hours. No results for input(s): AMMONIA in the last 168 hours. CBC: Recent Labs  Lab 03/04/18 1443  WBC 5.3  NEUTROABS 3.0  HGB 12.2*  HCT 39.3  MCV 95.9  PLT 290   Cardiac Enzymes: Recent Labs  Lab 03/04/18 1443 03/04/18 2059 03/05/18 0243  CKTOTAL 235  --   --   TROPONINI <0.03 <0.03 <0.03   BNP: Invalid input(s): POCBNP CBG: No results for input(s): GLUCAP in the last 168 hours. D-Dimer No results for input(s): DDIMER in the last 72 hours. Hgb A1c No results for input(s): HGBA1C in the last 72 hours. Lipid Profile No results for input(s): CHOL, HDL, LDLCALC, TRIG, CHOLHDL, LDLDIRECT in the last 72 hours. Thyroid function studies Recent Labs    03/04/18 1443  TSH 1.194   Anemia work up No results for input(s): VITAMINB12, FOLATE, FERRITIN, TIBC, IRON, RETICCTPCT in the last 72 hours. Urinalysis    Component Value Date/Time   BILIRUBINUR negative 11/24/2012 1350   PROTEINUR negative 11/24/2012 1350   UROBILINOGEN 0.2 11/24/2012 1350   NITRITE negative 11/24/2012 1350   LEUKOCYTESUR Negative 11/24/2012 1350   Sepsis Labs Invalid input(s): PROCALCITONIN,  WBC,  LACTICIDVEN Microbiology No results found for this or any previous visit (from the past 240  hour(s)).   Time coordinating discharge: <30 minutes  SIGNED:   Louellen Molder, MD  [facility] 03/05/2018, 11:51 AM Pager   If 7PM-7AM, please contact night-coverage [email or web address] Password [email or web address]

## 2018-03-05 NOTE — Assessment & Plan Note (Signed)
Noted increased sedation with higher dose of effexor through Psych in the past 3 weeks, will temporarily lower dose until he is re evaluated by Psych in the next 2 weeks

## 2018-03-05 NOTE — Assessment & Plan Note (Addendum)
Symptomatic hypotension with increased fatigue and sleepiness x 3 weeks. Chronic bradycardia unchanged , however systolic pressure noted to be extremely low. Spouse states this is associated with the change in medication dose , uncertain if this is the case but I will temporarily reduce dose of effexor until he is re revaluated by Psychiatry in [place] in next 2 weeks Based on low SBP, chronic bradycardia  and history, needs ED evaluation and probable admission. Discussed with ED Physician and patient taken by his wife to the ED

## 2018-03-05 NOTE — Assessment & Plan Note (Signed)
Chronic bradycardia, current HR at the visit within his normal range

## 2018-03-05 NOTE — Progress Notes (Addendum)
Progress Note  Patient Name: Terry Love Date of Encounter: 03/05/2018  Primary Cardiologist: Kate Sable, MD   Subjective   Reports having dyspnea when walking at times for longer distances. No associated chest discomfort or palpitations. Reports his weakness has improved from yesterday. Has been NPO since midnight.   Inpatient Medications    Scheduled Meds: . aspirin EC  81 mg Oral Daily  . atorvastatin  40 mg Oral QHS  . mometasone-formoterol  2 puff Inhalation BID  . venlafaxine XR  75 mg Oral Q breakfast    PRN Meds: acetaminophen **OR** acetaminophen, albuterol, ondansetron **OR** ondansetron (ZOFRAN) IV, polyethylene glycol   Vital Signs    Vitals:   03/04/18 1943 03/04/18 2200 03/05/18 0600 03/05/18 0856  BP:  130/68 130/66   Pulse:  (!) 46 (!) 47   Resp:  17 18   Temp:  97.9 F (36.6 C) 98.4 F (36.9 C)   TempSrc:  Oral Oral   SpO2: 97% 98% 98% 96%  Weight:      Height:       No intake or output data in the 24 hours ending 03/05/18 0932 Filed Weights   03/04/18 1411  Weight: 98.4 kg    Telemetry    Sinus bradycardia with 1st degree AV Block, HR mostly in 40's. HR peaking into the 70's.  - Personally Reviewed  ECG    Sinus bradycardia, HR 46, with 1st degree AV Block. - Personally Reviewed  Physical Exam   General: Well developed, well nourished African American male appearing in no acute distress. Head: Normocephalic, atraumatic.  Neck: Supple without bruits, JVD not elevated. Lungs:  Resp regular and unlabored, CTA without wheezing or rales. Heart: Regular rhythm, bradycardiac rate, S1, S2, no S3, S4, or murmur; no rub. Abdomen: Soft, non-tender, non-distended with normoactive bowel sounds. No hepatomegaly. No rebound/guarding. No obvious abdominal masses. Extremities: No clubbing, cyanosis, or edema. Distal pedal pulses are 2+ bilaterally. Neuro: Alert and oriented X 3. Moves all extremities spontaneously. Psych: Normal  affect.  Labs    Chemistry Recent Labs  Lab 03/04/18 1443  NA 138  K 3.8  CL 112*  CO2 21*  GLUCOSE 96  BUN 16  CREATININE 0.91  CALCIUM 9.2  PROT 7.4  ALBUMIN 4.0  AST 24  ALT 18  ALKPHOS 53  BILITOT 0.6  GFRNONAA >60  GFRAA >60  ANIONGAP 5     Hematology Recent Labs  Lab 03/04/18 1443  WBC 5.3  RBC 4.10*  HGB 12.2*  HCT 39.3  MCV 95.9  MCH 29.8  MCHC 31.0  RDW 12.2  PLT 290    Cardiac Enzymes Recent Labs  Lab 03/04/18 1443 03/04/18 2059 03/05/18 0243  TROPONINI <0.03 <0.03 <0.03    Recent Labs  Lab 03/04/18 1449  [place] 0.01     Radiology    Dg Chest 2 View  Result Date: 03/04/2018 CLINICAL DATA:  Bradycardia EXAM: CHEST - 2 VIEW COMPARISON:  06/24/2016 FINDINGS: Linear atelectasis or scar at the left base. No pleural effusion. Stable slightly enlarged cardiomediastinal silhouette. No pneumothorax. IMPRESSION: No active cardiopulmonary disease. Linear atelectasis or scarring at the left base. Electronically Signed   By: Donavan Foil M.D.   On: 03/04/2018 18:41    Cardiac Studies   Echocardiogram: 06/2015 Study Conclusions  - Left ventricle: The cavity size was normal. Wall thickness was   increased in a pattern of mild LVH. Systolic function was normal.   The estimated ejection fraction was in  the range of 60% to 65%.   Wall motion was normal; there were no regional wall motion   abnormalities. Doppler parameters are consistent with abnormal   left ventricular relaxation (grade 1 diastolic dysfunction).   Doppler parameters are consistent with high ventricular filling   pressure. - Aortic valve: Trileaflet; mildly calcified leaflets. There was no   stenosis. There was mild regurgitation. Mean gradient (S): 6 mm   Hg. Peak gradient (S): 14 mm Hg. Valve area (VTI): 2.12 cm^2. - Aortic root: The aortic root was mildly ectatic. - Mitral valve: There was trivial regurgitation. - Left atrium: The atrium was mildly to moderately  dilated. - Right atrium: The atrium was at the upper limits of normal in   size. Central venous pressure (est): 3 mm Hg. - Atrial septum: No defect or patent foramen ovale was identified. - Tricuspid valve: There was trivial regurgitation. - Pulmonary arteries: Systolic pressure could not be accurately   estimated. - Pericardium, extracardiac: There was no pericardial effusion.  Impressions:  - Mild LVH with LVEF 60-65%. Grade 1 diastolic dysfunction with   increased LV filling pressure. Mild to moderate left atrial   enlargement. Mildly ectatic aortic root. Sclerotic aortic valve   without stenosis. Overall mild aortic regurgitation made up of 2   small jets. Upper normal right atrial chamber size. Trivial   tricuspid regurgitation, unable to assess PASP.  Holter Monitor: 03/2016  Predominantly normal sinus rhythm with occasional sinus bradycardia (HR low 50 bpm range).  One isolated episode of chest pain correlated with normal sinus rhythm.  Patient Profile     82 y.o. male w/ PMH of baseline bradycardia and HLD who presented to [facility] ED on 03/04/2018 for evaluation of worsening weakness over the past 2 weeks and found to be hypotensive and bradycardiac while at his PCP's office earlier that day.   Assessment & Plan    1. Bradycardia/ Dyspnea on Exertion - this has been a chronic issue for the patient and he presented with worsening weakness and fatigue over the past few weeks, found to be both hypotensive and bradycardiac. He has not been on any AV nodal blocking agents. BP now improved to 130/66 by most recent check.  - he has been in 1st degree AV Block by review of telemetry with no evidence of high-grade block or significant pauses. TSH WNL. Echocardiogram performed with official report pending. Echo in 2017 showed a preserved EF with no regional WMA and NST in 03/2016 was low-risk. He has been NPO since midnight and will plan to obtain a ETT this morning to assess for  chronotropic incompetence. Pending results, will need to be referred to EP as an outpatient.  2. HLD - followed by PCP. Continue PTA Atorvastatin 40mg  daily.    For questions or updates, please contact [place] Please consult [email or web address] for contact info under Cardiology/STEMI.   Arna Medici , PA-C 9:32 AM 03/05/2018 Pager: [phone]   Attending note:  Patient seen and examined.  Reviewed cardiology consultation and plan per Dr. Johnsie Cancel.  Patient presents with progressive dyspnea on exertion in the setting of bradycardia, ECG showing sinus bradycardia with prolonged PR interval.  He has not had any documented high degree heart block, no pauses by telemetry.  Heart rate trend does look to be somewhat slower than previously assessed as an outpatient per Dr. Bronson Ing.  He is not on any AV nodal blockers, recent TSH normal.  He was kept n.p.o. after midnight.  Our  plan will be to proceed with a GXT this morning to assess for chronotropic incompetence which would be an indication for elective pacemaker.  Further recommendations to follow.  Satira Sark, M.D., F.A.C.C.

## 2018-03-05 NOTE — Progress Notes (Signed)
*  PRELIMINARY RESULTS* Echocardiogram 2D Echocardiogram has been performed.  Terry Love 03/05/2018, 9:43 AM

## 2018-03-05 NOTE — Discharge Instructions (Signed)
Bradycardia, Adult °Bradycardia is a slower-than-normal heartbeat. A normal resting heart rate for an adult ranges from 60 to 100 beats per minute. With bradycardia, the resting heart rate is less than 60 beats per minute. °Bradycardia can prevent enough oxygen from reaching certain areas of your body when you are active. It can be serious if it keeps enough oxygen from reaching your brain and other parts of your body. Bradycardia is not a problem for everyone. For some healthy adults, a slow resting heart rate is normal. °What are the causes? °This condition may be caused by: °· A problem with the heart, including: °? A problem with the heart's electrical system, such as a heart block. °? A problem with the heart's natural pacemaker (sinus node). °? Heart disease. °? A heart attack. °? Heart damage. °? A heart infection. °? A heart condition that is present at birth (congenital heart defect). °· Certain medicines that treat heart conditions. °· Certain conditions, such as hypothyroidism and obstructive sleep apnea. °· Problems with the balance of chemicals and other substances, like potassium, in the blood. ° °What increases the risk? °This condition is more likely to develop in adults who: °· Are age 65 or older. °· Have high blood pressure (hypertension), high cholesterol (hyperlipidemia), or diabetes. °· Drink heavily, use tobacco or nicotine products, or use drugs. °· Are stressed. ° °What are the signs or symptoms? °Symptoms of this condition include: °· Light-headedness. °· Feeling faint or fainting. °· Fatigue and weakness. °· Shortness of breath. °· Chest pain (angina). °· Drowsiness. °· Confusion. °· Dizziness. ° °How is this diagnosed? °This condition may be diagnosed based on: °· Your symptoms. °· Your medical history. °· A physical exam. ° °During the exam, your health care provider will listen to your heartbeat and check your pulse. To confirm the diagnosis, your health care provider may order tests,  such as: °· Blood tests. °· An electrocardiogram (ECG). This test records the heart's electrical activity. The test can show how fast your heart is beating and whether the heartbeat is steady. °· A test in which you wear a portable device (event recorder or Holter monitor) to record your heart's electrical activity while you go about your day. °· An exercise test. ° °How is this treated? °Treatment for this condition depends on the cause of the condition and how severe your symptoms are. Treatment may involve: °· Treatment of the underlying condition. °· Changing your medicines or how much medicine you take. °· Having a small, battery-operated device called a pacemaker implanted under the skin. When bradycardia occurs, this device can be used to increase your heart rate and help your heart to beat in a regular rhythm. ° °Follow these instructions at home: °Lifestyle ° °· Manage any health conditions that contribute to bradycardia as told by your health care provider. °· Follow a heart-healthy diet. A nutrition specialist (dietitian) can help to educate you about healthy food options and changes. °· Follow an exercise program that is approved by your health care provider. °· Maintain a healthy weight. °· Try to reduce or manage your stress, such as with yoga or meditation. If you need help reducing stress, ask your health care provider. °· Do not use use any products that contain nicotine or tobacco, such as cigarettes and e-cigarettes. If you need help quitting, ask your health care provider. °· Do not use illegal drugs. °· Limit alcohol intake to no more than 1 drink per day for nonpregnant women and 2 drinks per   day for men. One drink equals 12 oz of beer, 5 oz of wine, or 1½ oz of hard liquor. °General instructions °· Take over-the-counter and prescription medicines only as told by your health care provider. °· Keep all follow-up visits as directed by your health care provider. This is important. °How is this  prevented? °In some cases, bradycardia may be prevented by: °· Treating underlying medical problems. °· Stopping behaviors or medicines that can trigger the condition. ° °Contact a health care provider if: °· You feel light-headed or dizzy. °· You almost faint. °· You feel weak or are easily fatigued during physical activity. °· You experience confusion or have memory problems. °Get help right away if: °· You faint. °· You have an irregular heartbeat (palpitations). °· You have chest pain. °· You have trouble breathing. °This information is not intended to replace advice given to you by your health care provider. Make sure you discuss any questions you have with your health care provider. °Document Released: 12/16/2001 Document Revised: 11/22/2015 Document Reviewed: 09/15/2015 °Elsevier Interactive Patient Education © 2017 [employer] ° °

## 2018-03-10 ENCOUNTER — Telehealth: Payer: Self-pay | Admitting: *Deleted

## 2018-03-10 NOTE — Telephone Encounter (Signed)
Pt wife called wanted to know if the appt on 12/10 was really necessary as he has been saw in the office since this appt was made. Please give a call back to advise.

## 2018-03-12 NOTE — Telephone Encounter (Signed)
Let him know yes, he needs to have a hospital follow up with Dr Moshe Cipro within 2 weeks of his hospital discharge so he needs to keep the current appt

## 2018-03-18 ENCOUNTER — Ambulatory Visit (INDEPENDENT_AMBULATORY_CARE_PROVIDER_SITE_OTHER): Payer: Medicare Other | Admitting: Family Medicine

## 2018-03-18 ENCOUNTER — Encounter: Payer: Self-pay | Admitting: Family Medicine

## 2018-03-18 VITALS — BP 114/70 | HR 60 | Resp 15 | Ht 70.0 in | Wt 216.0 lb

## 2018-03-18 DIAGNOSIS — Z09 Encounter for follow-up examination after completed treatment for conditions other than malignant neoplasm: Secondary | ICD-10-CM

## 2018-03-18 DIAGNOSIS — F418 Other specified anxiety disorders: Secondary | ICD-10-CM

## 2018-03-18 NOTE — Patient Instructions (Signed)
F/u in 4 months, call if you need me sooner   I do believe that you are a good candidate for a pacemaker if this is recommended by Dr Lovena Le , very important that you go to the appointment   Please continue  To see your therapist to help your mental health, your daily stress is real  Commit to YOU time weekly with Mrs Meiring  Thank you  for choosing [facility]. We consider it a privelige to serve you.  Delivering excellent health care in a caring and  compassionate way is our goal.  Partnering with you,  so that together we can achieve this goal is our strategy.

## 2018-03-19 ENCOUNTER — Encounter: Payer: Self-pay | Admitting: Internal Medicine

## 2018-03-19 ENCOUNTER — Ambulatory Visit (INDEPENDENT_AMBULATORY_CARE_PROVIDER_SITE_OTHER): Payer: Medicare Other | Admitting: Internal Medicine

## 2018-03-19 VITALS — BP 124/64 | HR 50 | Ht 70.0 in | Wt 218.0 lb

## 2018-03-19 DIAGNOSIS — R001 Bradycardia, unspecified: Secondary | ICD-10-CM

## 2018-03-19 NOTE — Patient Instructions (Addendum)
Medication Instructions:  Your physician recommends that you continue on your current medications as directed. Please refer to the Current Medication list given to you today.  Labwork: None ordered.  Testing/Procedures: Your physician has recommended that you have a pacemaker inserted. A pacemaker is a small device that is placed under the skin of your chest or abdomen to help control abnormal heart rhythms. This device uses electrical pulses to prompt the heart to beat at a normal rate. Pacemakers are used to treat heart rhythms that are too slow. Wire (leads) are attached to the pacemaker that goes into the chambers of you heart. This is done in the hospital and usually requires and overnight stay. Please see the instruction sheet given to you today for more information.  Follow-Up: You will follow up with device clinic 10-14 days after your procedure for a wound check.  You will follow up with Dr. Lovena Le 91 days after your procedure.   INSTRUCTIONS for PACEMAKER:  Please arrive to ADMITTING down the hall from the [facility] main entrance of [place] [facility] at:  11:30 am on March 24, 2018  Use the CHG surgical scrub as directed  Do not eat or drink after midnight prior to procedure  You may take your normal morning medications with a sip of water  Plan for one night stay  You will need someone to drive you home at discharge      Pacemaker Implantation, Adult Pacemaker implantation is a procedure to place a pacemaker inside your chest. A pacemaker is a small computer that sends electrical signals to the heart and helps your heart beat normally. A pacemaker also stores information about your heart rhythms. You may need pacemaker implantation if you:  Have a slow heartbeat (bradycardia).  Faint (syncope).  Have shortness of breath (dyspnea) due to heart problems.  The pacemaker attaches to your heart through a wire, called a lead. Sometimes just one lead is needed.  Other times, there will be two leads. There are two types of pacemakers:  Transvenous pacemaker. This type is placed under the skin or muscle of your chest. The lead goes through a vein in the chest area to reach the inside of the heart.  Epicardial pacemaker. This type is placed under the skin or muscle of your chest or belly. The lead goes through your chest to the outside of the heart.  Tell a health care provider about:  Any allergies you have.  All medicines you are taking, including vitamins, herbs, eye drops, creams, and over-the-counter medicines.  Any problems you or family members have had with anesthetic medicines.  Any blood or bone disorders you have.  Any surgeries you have had.  Any medical conditions you have.  Whether you are pregnant or may be pregnant. What are the risks? Generally, this is a safe procedure. However, problems may occur, including:  Infection.  Bleeding.  Failure of the pacemaker or the lead.  Collapse of a lung or bleeding into a lung.  Blood clot inside a blood vessel with a lead.  Damage to the heart.  Infection inside the heart (endocarditis).  Allergic reactions to medicines.  What happens before the procedure? Staying hydrated Follow instructions from your health care provider about hydration, which may include:  Up to 2 hours before the procedure - you may continue to drink clear liquids, such as water, clear fruit juice, black coffee, and plain tea.  Eating and drinking restrictions Follow instructions from your health care provider about  eating and drinking, which may include:  8 hours before the procedure - stop eating heavy meals or foods such as meat, fried foods, or fatty foods.  6 hours before the procedure - stop eating light meals or foods, such as toast or cereal.  6 hours before the procedure - stop drinking milk or drinks that contain milk.  2 hours before the procedure - stop drinking clear  liquids.  Medicines  Ask your health care provider about: ? Changing or stopping your regular medicines. This is especially important if you are taking diabetes medicines or blood thinners. ? Taking medicines such as aspirin and ibuprofen. These medicines can thin your blood. Do not take these medicines before your procedure if your health care provider instructs you not to.  You may be given antibiotic medicine to help prevent infection. General instructions  You will have a heart evaluation. This may include an electrocardiogram (ECG), chest X-ray, and heart imaging (echocardiogram,  or echo) tests.  You will have blood tests.  Do not use any products that contain nicotine or tobacco, such as cigarettes and e-cigarettes. If you need help quitting, ask your health care provider.  Plan to have someone take you home from the hospital or clinic.  If you will be going home right after the procedure, plan to have someone with you for 24 hours.  Ask your health care provider how your surgical site will be marked or identified. What happens during the procedure?  To reduce your risk of infection: ? Your health care team will wash or sanitize their hands. ? Your skin will be washed with soap. ? Hair may be removed from the surgical area.  An IV tube will be inserted into one of your veins.  You will be given one or more of the following: ? A medicine to help you relax (sedative). ? A medicine to numb the area (local anesthetic). ? A medicine to make you fall asleep (general anesthetic).  If you are getting a transvenous pacemaker: ? An incision will be made in your upper chest. ? A pocket will be made for the pacemaker. It may be placed under the skin or between layers of muscle. ? The lead will be inserted into a blood vessel that returns to the heart. ? While X-rays are taken by an imaging machine (fluoroscopy), the lead will be advanced through the vein to the inside of your  heart. ? The other end of the lead will be tunneled under the skin and attached to the pacemaker.  If you are getting an epicardial pacemaker: ? An incision will be made near your ribs or breastbone (sternum) for the lead. ? The lead will be attached to the outside of your heart. ? Another incision will be made in your chest or upper belly to create a pocket for the pacemaker. ? The free end of the lead will be tunneled under the skin and attached to the pacemaker.  The transvenous or epicardial pacemaker will be tested. Imaging studies may be done to check the lead position.  The incisions will be closed with stitches (sutures), adhesive strips, or skin glue.  Bandages (dressing) will be placed over the incisions. The procedure may vary among health care providers and hospitals. What happens after the procedure?  Your blood pressure, heart rate, breathing rate, and blood oxygen level will be monitored until the medicines you were given have worn off.  You will be given antibiotics and pain medicine.  ECG and  chest x-rays will be done.  You will wear a continuous type of ECG (Holter monitor) to check your heart rhythm.  Your health care provider willprogram the pacemaker.  Do not drive for 24 hours if you received a sedative. This information is not intended to replace advice given to you by your health care provider. Make sure you discuss any questions you have with your health care provider. Document Released: 03/16/2002 Document Revised: 10/14/2015 Document Reviewed: 09/07/2015 Elsevier Interactive Patient Education  [employer].

## 2018-03-19 NOTE — Progress Notes (Signed)
HPI Terry Love is referred today by Dr. Denton Brick for evaluation of sinus node dysfunction. He is a pleasant 82 yo man with a longstanding h/o HTN and COPD. He presented to the ED a couple of weeks ago with weakness and fatigue and was noted to have sinus brady in the high 30's. He underwent exercise testing and had a HR of 80/min. He got tired with exertion. His energy level has been poor for several months. He notes that he has had a slow HR for many years. No Known Allergies   Current Outpatient Medications  Medication Sig Dispense Refill  . albuterol (PROVENTIL HFA;VENTOLIN HFA) 108 (90 Base) MCG/ACT inhaler Inhale 2 puffs into the lungs every 4 (four) hours as needed for wheezing or shortness of breath (cough, shortness of breath or wheezing.). 1 Inhaler 11  . aspirin (BAYER ASPIRIN EC LOW DOSE) 81 MG EC tablet Take 81 mg by mouth daily.      Marland Kitchen atorvastatin (LIPITOR) 40 MG tablet Take 1 tablet (40 mg total) by mouth at bedtime. 90 tablet 1  . azelastine (ASTELIN) 0.1 % nasal spray Place 2 sprays into both nostrils 2 (two) times daily. Use in each nostril as directed 30 mL 12  . budesonide-formoterol (SYMBICORT) 160-4.5 MCG/ACT inhaler TAKE 2 PUFFS BY MOUTH TWICE A DAY 10.2 Inhaler 2  . calcium-vitamin D (OSCAL WITH D) 500-200 MG-UNIT tablet Take 1 tablet by mouth 2 (two) times daily. 150 tablet 1  . Multiple Vitamin (MULTIVITAMIN) tablet Take 1 tablet by mouth daily. Unspecified frequency    . vitamin C (ASCORBIC ACID) 500 MG tablet Take 500 mg by mouth daily.    . Vitamin D, Ergocalciferol, (DRISDOL) 1.25 MG (50000 UT) CAPS capsule TAKE ONE CAPSULE BY MOUTH A WEEK 12 capsule 0  . Wheat Dextrin (BENEFIBER DRINK MIX PO) Take by mouth. Use as directed daily    . venlafaxine XR (EFFEXOR-XR) 75 MG 24 hr capsule Take 75 mg by mouth daily.  0   No current facility-administered medications for this visit.      Past Medical History:  Diagnosis Date  . Anxiety   . Bradycardia    Chronic   . Colon polyps   . Depression   . Facial tic   . Hearing loss   . Hyperlipidemia   . 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 Obesity   . Seasonal allergies     ROS:   All systems reviewed and negative except as noted in the HPI.   Past Surgical History:  Procedure Laterality Date  . carpal tunnel  release right  2006  . COLONOSCOPY    . COLONOSCOPY  01/25/2011   Dr. Rehman:Examination performed to cecum Pan colonic diverticulosis/2 small polyps ablated via cold biopsy from transverse colon/External hemorrhoids, benign polyps  . COLONOSCOPY N/A 06/29/2013   Procedure: COLONOSCOPY;  Surgeon: Daneil Dolin, MD;  Location: [facility];  Service: Endoscopy;  Laterality: N/A;  10:45  . EYE SURGERY     left eye cataracts      Family History  Problem Relation Age of Onset  . Colon cancer Mother        diagnosed at age 4  . Aneurysm Father   . Aneurysm Brother   . Stroke Brother      Social History   Socioeconomic History  . Marital status: Married    Spouse name: Terry Love   . Number of children: 0  . Years of education:  12  . Highest education level: 12th grade  Occupational History  . Occupation: retired  Scientific laboratory technician  . Financial resource strain: Not very hard  . Food insecurity:    Worry: Never true    Inability: Never true  . Transportation needs:    Medical: No    Non-medical: No  Tobacco Use  . Smoking status: Never Smoker  . Smokeless tobacco: Former Systems developer    Types: Chew  Substance and Sexual Activity  . Alcohol use: No    Alcohol/week: 0.0 standard drinks  . Drug use: No  . Sexual activity: Not Currently  Lifestyle  . Physical activity:    Days per week: 3 days    Minutes per session: 20 min  . Stress: Not at all  Relationships  . Social connections:    Talks on phone: Once a week    Gets together: Once a week    Attends religious service: 1 to 4 times per year    Active member of club or organization: Yes     Attends meetings of clubs or organizations: 1 to 4 times per year    Relationship status: Married  . Intimate partner violence:    Fear of current or ex partner: No    Emotionally abused: No    Physically abused: No    Forced sexual activity: No  Other Topics Concern  . Not on file  Social History Narrative  . Not on file     BP 124/64   Pulse (!) 50   Ht 5\' 10"  (1.778 m)   Wt 218 lb (98.9 kg)   SpO2 97%   BMI 31.28 kg/m   Physical Exam:  Well appearing NAD HEENT: Unremarkable Neck:  No JVD, no thyromegally Lymphatics:  No adenopathy Back:  No CVA tenderness Lungs:  Clear with no wheezes HEART:  Regular rate rhythm, no murmurs, no rubs, no clicks Abd:  soft, positive bowel sounds, no organomegally, no rebound, no guarding Ext:  2 plus pulses, no edema, no cyanosis, no clubbing Skin:  No rashes no nodules Neuro:  CN II through XII intact, motor grossly intact  EKG - reviewed - sinus brady at 38/min   Assess/Plan: 1. Sinus node dysfunction - I have discussed the treatment options  2. HTN - his blood pressure is not elevated. He notes that his pressures have come down some since and have at times been low.  Mikle Bosworth.D.

## 2018-03-19 NOTE — H&P (View-Only) (Signed)
HPI [name] is referred today by Dr. Denton Brick for evaluation of sinus node dysfunction. He is a pleasant 82 yo man with a longstanding h/o HTN and COPD. He presented to the ED a couple of weeks ago with weakness and fatigue and was noted to have sinus brady in the high 30's. He underwent exercise testing and had a HR of 80/min. He got tired with exertion. His energy level has been poor for several months. He notes that he has had a slow HR for many years. No Known Allergies   Current Outpatient Medications  Medication Sig Dispense Refill  . albuterol (PROVENTIL HFA;VENTOLIN HFA) 108 (90 Base) MCG/ACT inhaler Inhale 2 puffs into the lungs every 4 (four) hours as needed for wheezing or shortness of breath (cough, shortness of breath or wheezing.). 1 Inhaler 11  . aspirin (BAYER ASPIRIN EC LOW DOSE) 81 MG EC tablet Take 81 mg by mouth daily.      Marland Kitchen atorvastatin (LIPITOR) 40 MG tablet Take 1 tablet (40 mg total) by mouth at bedtime. 90 tablet 1  . azelastine (ASTELIN) 0.1 % nasal spray Place 2 sprays into both nostrils 2 (two) times daily. Use in each nostril as directed 30 mL 12  . budesonide-formoterol (SYMBICORT) 160-4.5 MCG/ACT inhaler TAKE 2 PUFFS BY MOUTH TWICE A DAY 10.2 Inhaler 2  . calcium-vitamin D (OSCAL WITH D) 500-200 MG-UNIT tablet Take 1 tablet by mouth 2 (two) times daily. 150 tablet 1  . Multiple Vitamin (MULTIVITAMIN) tablet Take 1 tablet by mouth daily. Unspecified frequency    . vitamin C (ASCORBIC ACID) 500 MG tablet Take 500 mg by mouth daily.    . Vitamin D, Ergocalciferol, (DRISDOL) 1.25 MG (50000 UT) CAPS capsule TAKE ONE CAPSULE BY MOUTH A WEEK 12 capsule 0  . Wheat Dextrin (BENEFIBER DRINK MIX PO) Take by mouth. Use as directed daily    . venlafaxine XR (EFFEXOR-XR) 75 MG 24 hr capsule Take 75 mg by mouth daily.  0   No current facility-administered medications for this visit.      Past Medical History:  Diagnosis Date  . Anxiety   . Bradycardia    Chronic   . Colon polyps   . Depression   . Facial tic   . Hearing loss   . Hyperlipidemia   . 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 Obesity   . Seasonal allergies     ROS:   All systems reviewed and negative except as noted in the HPI.   Past Surgical History:  Procedure Laterality Date  . carpal tunnel  release right  2006  . COLONOSCOPY    . COLONOSCOPY  01/25/2011   Dr. Rehman:Examination performed to cecum Pan colonic diverticulosis/2 small polyps ablated via cold biopsy from transverse colon/External hemorrhoids, benign polyps  . COLONOSCOPY N/A 06/29/2013   Procedure: COLONOSCOPY;  Surgeon: Daneil Dolin, MD;  Location: [facility];  Service: Endoscopy;  Laterality: N/A;  10:45  . EYE SURGERY     left eye cataracts      Family History  Problem Relation Age of Onset  . Colon cancer Mother        diagnosed at age 63  . Aneurysm Father   . Aneurysm Brother   . Stroke Brother      Social History   Socioeconomic History  . Marital status: Married    Spouse name: Ceasar Lund   . Number of children: 0  . Years of education:  12  . Highest education level: 12th grade  Occupational History  . Occupation: retired  Scientific laboratory technician  . Financial resource strain: Not very hard  . Food insecurity:    Worry: Never true    Inability: Never true  . Transportation needs:    Medical: No    Non-medical: No  Tobacco Use  . Smoking status: Never Smoker  . Smokeless tobacco: Former Systems developer    Types: Chew  Substance and Sexual Activity  . Alcohol use: No    Alcohol/week: 0.0 standard drinks  . Drug use: No  . Sexual activity: Not Currently  Lifestyle  . Physical activity:    Days per week: 3 days    Minutes per session: 20 min  . Stress: Not at all  Relationships  . Social connections:    Talks on phone: Once a week    Gets together: Once a week    Attends religious service: 1 to 4 times per year    Active member of club or organization: Yes     Attends meetings of clubs or organizations: 1 to 4 times per year    Relationship status: Married  . Intimate partner violence:    Fear of current or ex partner: No    Emotionally abused: No    Physically abused: No    Forced sexual activity: No  Other Topics Concern  . Not on file  Social History Narrative  . Not on file     BP 124/64   Pulse (!) 50   Ht 5\' 10"  (1.778 m)   Wt 218 lb (98.9 kg)   SpO2 97%   BMI 31.28 kg/m   Physical Exam:  Well appearing NAD HEENT: Unremarkable Neck:  No JVD, no thyromegally Lymphatics:  No adenopathy Back:  No CVA tenderness Lungs:  Clear with no wheezes HEART:  Regular rate rhythm, no murmurs, no rubs, no clicks Abd:  soft, positive bowel sounds, no organomegally, no rebound, no guarding Ext:  2 plus pulses, no edema, no cyanosis, no clubbing Skin:  No rashes no nodules Neuro:  CN II through XII intact, motor grossly intact  EKG - reviewed - sinus brady at 38/min   Assess/Plan: 1. Sinus node dysfunction - I have discussed the treatment options  2. HTN - his blood pressure is not elevated. He notes that his pressures have come down some since and have at times been low.  Mikle Bosworth.D.

## 2018-03-20 ENCOUNTER — Other Ambulatory Visit: Payer: Self-pay | Admitting: Family Medicine

## 2018-03-22 ENCOUNTER — Encounter: Payer: Self-pay | Admitting: Family Medicine

## 2018-03-22 NOTE — Assessment & Plan Note (Signed)
Hospital course reviewed and I recommended that pt follow through with pacemaker placement if proposed by Cardiology, he is in agreement with this

## 2018-03-22 NOTE — Progress Notes (Signed)
   Terry Love     MRN: [id number]      DOB: Dec 19, 1935   HPI [name] is here for follow up and re-evaluation following recent hospitalization from 11/26 to 11/27 with a main dx of sinus bradycardia. He has been followed for over 10 years with this problem, however condition has worsened over time, with increased fatigue , rate down in the 30/s during hospitalization States he feels better , wants  My opinion on a pacemaker, which I recommend based on recent presentation. Also discusses at length , the fact that he I concerned about his wife's health and their marriage as her sister's ill health continues to take a negative toll on both, states " something has to give"Sees a therapist through the [place] and I encourage him to contiinue this ROS Denies recent fever or chills. Denies sinus pressure, nasal congestion, ear pain or sore throat. Denies chest congestion, productive cough or wheezing.  Denies abdominal pain, nausea, vomiting,diarrhea or constipation.   Denies dysuria, frequency, hesitancy or incontinence. Denies joint pain, swelling and limitation in mobility. Denies headaches, seizures, numbness, or tingling. Denies skin break down or rash.   PE  BP 114/70   Pulse 60   Resp 15   Ht 5\' 10"  (1.778 m)   Wt 216 lb (98 kg)   SpO2 96%   BMI 30.99 kg/m   Patient alert and oriented and in no cardiopulmonary distress.  HEENT: No facial asymmetry, EOMI,   oropharynx pink and moist.  Neck supple no JVD, no mass.  Chest: Clear to auscultation bilaterally.  CVS: S1, S2 no murmurs, no S3.Regular rate.  ABD: Soft non tender.   Ext: No edema  MS: Adequate ROM spine, shoulders, hips and knees.  Skin: Intact, no ulcerations or rash noted.  Psych: Good eye contact, normal affect. Memory impaired , mildly  anxious and  depressed appearing.  CNS: CN 2-12 intact, power,  normal throughout.no focal deficits noted.   [facility] discharge follow-up Hospital  course reviewed and I recommended that pt follow through with pacemaker placement if proposed by Cardiology, he is in agreement with this  Depression with anxiety Increase stress at home due to paranoia in his sister in law, which has been progressively worsening, he is  Continue therapy and treatment through the [place]

## 2018-03-22 NOTE — Assessment & Plan Note (Signed)
Increase stress at home due to paranoia in his sister in law, which has been progressively worsening, he is  Continue therapy and treatment through the [place]

## 2018-03-24 ENCOUNTER — Ambulatory Visit (HOSPITAL_COMMUNITY)
Admission: RE | Admit: 2018-03-24 | Discharge: 2018-03-25 | Disposition: A | Discharge status: Home / Self Care | Payer: Medicare Other | Attending: Internal Medicine | Admitting: Internal Medicine

## 2018-03-24 ENCOUNTER — Encounter (HOSPITAL_COMMUNITY): Payer: Self-pay

## 2018-03-24 ENCOUNTER — Other Ambulatory Visit: Payer: Self-pay

## 2018-03-24 ENCOUNTER — Ambulatory Visit (HOSPITAL_COMMUNITY)
Admission: RE | Disposition: A | Discharge status: Home / Self Care | Payer: Self-pay | Source: Home / Self Care | Attending: Internal Medicine

## 2018-03-24 DIAGNOSIS — Z79899 Other long term (current) drug therapy: Secondary | ICD-10-CM | POA: Diagnosis not present

## 2018-03-24 DIAGNOSIS — I10 Essential (primary) hypertension: Secondary | ICD-10-CM | POA: Diagnosis not present

## 2018-03-24 DIAGNOSIS — Z7982 Long term (current) use of aspirin: Secondary | ICD-10-CM | POA: Insufficient documentation

## 2018-03-24 DIAGNOSIS — I495 Sick sinus syndrome: Secondary | ICD-10-CM | POA: Diagnosis not present

## 2018-03-24 DIAGNOSIS — Z6831 Body mass index (BMI) 31.0-31.9, adult: Secondary | ICD-10-CM | POA: Insufficient documentation

## 2018-03-24 DIAGNOSIS — J449 Chronic obstructive pulmonary disease, unspecified: Secondary | ICD-10-CM | POA: Diagnosis not present

## 2018-03-24 DIAGNOSIS — E785 Hyperlipidemia, unspecified: Secondary | ICD-10-CM | POA: Insufficient documentation

## 2018-03-24 DIAGNOSIS — Z7951 Long term (current) use of inhaled steroids: Secondary | ICD-10-CM | POA: Diagnosis not present

## 2018-03-24 DIAGNOSIS — Z950 Presence of cardiac pacemaker: Secondary | ICD-10-CM

## 2018-03-24 DIAGNOSIS — Z823 Family history of stroke: Secondary | ICD-10-CM | POA: Insufficient documentation

## 2018-03-24 DIAGNOSIS — Z8249 Family history of ischemic heart disease and other diseases of the circulatory system: Secondary | ICD-10-CM | POA: Diagnosis not present

## 2018-03-24 DIAGNOSIS — E669 Obesity, unspecified: Secondary | ICD-10-CM | POA: Diagnosis not present

## 2018-03-24 DIAGNOSIS — R001 Bradycardia, unspecified: Secondary | ICD-10-CM | POA: Diagnosis present

## 2018-03-24 HISTORY — PX: PACEMAKER IMPLANT: EP1218

## 2018-03-24 LAB — SURGICAL PCR SCREEN
MRSA, PCR: NEGATIVE
Staphylococcus aureus: NEGATIVE

## 2018-03-24 SURGERY — PACEMAKER IMPLANT

## 2018-03-24 MED ORDER — SODIUM CHLORIDE 0.9 % IV SOLN
INTRAVENOUS | Status: AC
  Filled 2018-03-24: qty 2

## 2018-03-24 MED ORDER — MIDAZOLAM HCL 5 MG/5ML IJ SOLN
INTRAMUSCULAR | Status: AC
  Filled 2018-03-24: qty 5

## 2018-03-24 MED ORDER — HEPARIN (PORCINE) IN NACL 1000-0.9 UT/500ML-% IV SOLN
INTRAVENOUS | Status: DC | PRN
  Administered 2018-03-24: 500 mL

## 2018-03-24 MED ORDER — LIDOCAINE HCL 1 % IJ SOLN
INTRAMUSCULAR | Status: AC
  Filled 2018-03-24: qty 60

## 2018-03-24 MED ORDER — MIDAZOLAM HCL 5 MG/5ML IJ SOLN
INTRAMUSCULAR | Status: DC | PRN
  Administered 2018-03-24 (×2): 1 mg via INTRAVENOUS

## 2018-03-24 MED ORDER — SODIUM CHLORIDE 0.9 % IV SOLN
INTRAVENOUS | Status: DC
  Administered 2018-03-24: 13:00:00 via INTRAVENOUS

## 2018-03-24 MED ORDER — CEFAZOLIN SODIUM-DEXTROSE 2-4 GM/100ML-% IV SOLN
INTRAVENOUS | Status: AC
  Filled 2018-03-24: qty 100

## 2018-03-24 MED ORDER — MUPIROCIN 2 % EX OINT
TOPICAL_OINTMENT | CUTANEOUS | Status: AC
  Administered 2018-03-24: 13:00:00 via NASAL
  Filled 2018-03-24: qty 22

## 2018-03-24 MED ORDER — ACETAMINOPHEN 325 MG PO TABS
325.0000 mg | ORAL_TABLET | ORAL | Status: DC | PRN
  Administered 2018-03-24: 650 mg via ORAL
  Filled 2018-03-24: qty 2

## 2018-03-24 MED ORDER — FENTANYL CITRATE (PF) 100 MCG/2ML IJ SOLN
INTRAMUSCULAR | Status: DC | PRN
  Administered 2018-03-24 (×2): 12.5 ug via INTRAVENOUS

## 2018-03-24 MED ORDER — SODIUM CHLORIDE 0.9 % IV SOLN
80.0000 mg | INTRAVENOUS | Status: AC
  Administered 2018-03-24: 80 mg

## 2018-03-24 MED ORDER — LIDOCAINE HCL (PF) 1 % IJ SOLN
INTRAMUSCULAR | Status: DC | PRN
  Administered 2018-03-24: 40 mL

## 2018-03-24 MED ORDER — FENTANYL CITRATE (PF) 100 MCG/2ML IJ SOLN
INTRAMUSCULAR | Status: AC
  Filled 2018-03-24: qty 2

## 2018-03-24 MED ORDER — HEPARIN (PORCINE) IN NACL 1000-0.9 UT/500ML-% IV SOLN
INTRAVENOUS | Status: AC
  Filled 2018-03-24: qty 500

## 2018-03-24 MED ORDER — CEFAZOLIN SODIUM-DEXTROSE 2-4 GM/100ML-% IV SOLN
2.0000 g | INTRAVENOUS | Status: AC
  Administered 2018-03-24: 2 g via INTRAVENOUS

## 2018-03-24 MED ORDER — ONDANSETRON HCL 4 MG/2ML IJ SOLN: 4.0000 mg | Freq: Four times a day (QID) | INTRAMUSCULAR | Status: DC | PRN

## 2018-03-24 MED ORDER — CEFAZOLIN SODIUM-DEXTROSE 1-4 GM/50ML-% IV SOLN
1.0000 g | Freq: Four times a day (QID) | INTRAVENOUS | Status: AC
  Administered 2018-03-24 – 2018-03-25 (×3): 1 g via INTRAVENOUS
  Filled 2018-03-24 (×3): qty 50

## 2018-03-24 MED ORDER — CHLORHEXIDINE GLUCONATE 4 % EX LIQD: 60.0000 mL | Freq: Once | CUTANEOUS | Status: DC

## 2018-03-24 SURGICAL SUPPLY — 7 items
CABLE SURGICAL S-101-97-12 (CABLE) ×3 IMPLANT
LEAD SOLIA S PRO MRI 53 (Lead) ×3 IMPLANT
LEAD SOLIA S PRO MRI 60 (Lead) ×3 IMPLANT
PACEMAKER EDORA 8DR-T MRI (Pacemaker) ×3 IMPLANT
PAD PRO RADIOLUCENT 2001M-C (PAD) ×3 IMPLANT
SHEATH CLASSIC 7F (SHEATH) ×6 IMPLANT
TRAY PACEMAKER INSERTION (PACKS) ×3 IMPLANT

## 2018-03-24 NOTE — Plan of Care (Signed)
  Problem: Education: Goal: Knowledge of General Education information will improve Description Including pain rating scale, medication(s)/side effects and non-pharmacologic comfort measures Outcome: Progressing   Problem: Clinical Measurements: Goal: Ability to maintain clinical measurements within normal limits will improve Outcome: Progressing Goal: Will remain free from infection Outcome: Progressing   Problem: Clinical Measurements: Goal: Respiratory complications will improve Outcome: Completed/Met

## 2018-03-24 NOTE — Discharge Instructions (Signed)
° ° °  Supplemental Discharge Instructions for  Pacemaker/Defibrillator Patients  Activity No heavy lifting or vigorous activity with your left/right arm for 6 to 8 weeks.  Do not raise your left/right arm above your head for one week.  Gradually raise your affected arm as drawn below.              03/28/18                  03/29/18                   03/30/18                 03/31/18 __  NO DRIVING for 1 week   ; you may begin driving on  79/15/05   .  WOUND CARE - Keep the wound area clean and dry.  Do not get this area wet for one week. No showers for one week; you may shower on  03/31/18   . - The tape/steri-strips on your wound will fall off; do not pull them off.  No bandage is needed on the site.  DO  NOT apply any creams, oils, or ointments to the wound area. - If you notice any drainage or discharge from the wound, any swelling or bruising at the site, or you develop a fever > 101? F after you are discharged home, call the office at once.  Special Instructions - You are still able to use cellular telephones; use the ear opposite the side where you have your pacemaker/defibrillator.  Avoid carrying your cellular phone near your device. - When traveling through airports, show security personnel your identification card to avoid being screened in the metal detectors.  Ask the security personnel to use the hand wand. - Avoid arc welding equipment, MRI testing (magnetic resonance imaging), TENS units (transcutaneous nerve stimulators).  Call the office for questions about other devices. - Avoid electrical appliances that are in poor condition or are not properly grounded. - Microwave ovens are safe to be near or to operate.

## 2018-03-24 NOTE — Interval H&P Note (Signed)
History and Physical Interval Note:  03/24/2018 12:58 PM  Terry Love  has presented today for surgery, with the diagnosis of bradicardia  The various methods of treatment have been discussed with the patient and family. After consideration of risks, benefits and other options for treatment, the patient has consented to  Procedure(s): PACEMAKER IMPLANT (N/A) as a surgical intervention .  The patient's history has been reviewed, patient examined, no change in status, stable for surgery.  I have reviewed the patient's chart and labs.  Questions were answered to the patient's satisfaction.     Cristopher Peru

## 2018-03-25 ENCOUNTER — Encounter (HOSPITAL_COMMUNITY): Payer: Self-pay | Admitting: Internal Medicine

## 2018-03-25 ENCOUNTER — Ambulatory Visit (HOSPITAL_COMMUNITY): Payer: Medicare Other

## 2018-03-25 DIAGNOSIS — Z823 Family history of stroke: Secondary | ICD-10-CM | POA: Diagnosis not present

## 2018-03-25 DIAGNOSIS — I495 Sick sinus syndrome: Secondary | ICD-10-CM | POA: Diagnosis not present

## 2018-03-25 DIAGNOSIS — I10 Essential (primary) hypertension: Secondary | ICD-10-CM | POA: Diagnosis not present

## 2018-03-25 DIAGNOSIS — Z79899 Other long term (current) drug therapy: Secondary | ICD-10-CM | POA: Diagnosis not present

## 2018-03-25 DIAGNOSIS — Z8249 Family history of ischemic heart disease and other diseases of the circulatory system: Secondary | ICD-10-CM | POA: Diagnosis not present

## 2018-03-25 DIAGNOSIS — Z451 Encounter for adjustment and management of infusion pump: Secondary | ICD-10-CM | POA: Diagnosis not present

## 2018-03-25 DIAGNOSIS — Z6831 Body mass index (BMI) 31.0-31.9, adult: Secondary | ICD-10-CM | POA: Diagnosis not present

## 2018-03-25 DIAGNOSIS — J449 Chronic obstructive pulmonary disease, unspecified: Secondary | ICD-10-CM | POA: Diagnosis not present

## 2018-03-25 DIAGNOSIS — E785 Hyperlipidemia, unspecified: Secondary | ICD-10-CM | POA: Diagnosis not present

## 2018-03-25 DIAGNOSIS — Z7951 Long term (current) use of inhaled steroids: Secondary | ICD-10-CM | POA: Diagnosis not present

## 2018-03-25 DIAGNOSIS — E669 Obesity, unspecified: Secondary | ICD-10-CM | POA: Diagnosis not present

## 2018-03-25 DIAGNOSIS — Z7982 Long term (current) use of aspirin: Secondary | ICD-10-CM | POA: Diagnosis not present

## 2018-03-25 DIAGNOSIS — J984 Other disorders of lung: Secondary | ICD-10-CM | POA: Diagnosis not present

## 2018-03-25 MED FILL — Lidocaine HCl Local Inj 1%: INTRAMUSCULAR | Qty: 40 | Status: AC

## 2018-03-25 NOTE — Discharge Summary (Addendum)
ELECTROPHYSIOLOGY PROCEDURE DISCHARGE SUMMARY    Patient ID: Terry Love,  MRN: [id number], DOB/AGE: 1935/10/12 82 y.o.  Admit date: 03/24/2018 Discharge date: 03/25/2018  Primary Care Physician: Fayrene Helper, MD Electrophysiologist: Lovena Le  Primary Discharge Diagnosis:  Symptomatic bradycardia status post pacemaker implantation this admission  Secondary Discharge Diagnosis:  1.  HTN 2.  COPD 3.  Depression 4.  Obesity  No Known Allergies   Procedures This Admission:  1.  Implantation of a Biotronik dual chamber PPM on 03/24/18 by Dr Lovena Le.  See op note for full details.There were no immediate post procedure complications. 2.  CXR on 03/25/18 demonstrated no pneumothorax status post device implantation.   Brief HPI: Terry Love is a 82 y.o. male was referred to electrophysiology in the outpatient setting for consideration of PPM implantation.  Past medical history includes sinus bradycardia.  The patient has had symptomatic bradycardia without reversible causes identified.  Risks, benefits, and alternatives to PPM implantation were reviewed with the patient who wished to proceed.   Hospital Course:  The patient was admitted and underwent implantation of a Biotronik dual chamber PPM with details as outlined above.  He  was monitored on telemetry overnight which demonstrated atrial pacing with intrinsic ventricular conduction.  Left chest was without hematoma or ecchymosis.  The device was interrogated and found to be functioning normally.  CXR was obtained and demonstrated no pneumothorax status post device implantation.  Wound care, arm mobility, and restrictions were reviewed with the patient.  The patient was examined and considered stable for discharge to home.    Physical Exam: Vitals:   03/24/18 1843 03/24/18 2010 03/25/18 0402 03/25/18 0415  BP: (!) 149/85 (!) 175/73  (!) 151/87  Pulse:  60  66  Resp:  (!) 22  (!) 21  Temp:  98.4 F (36.9 C)  98.1  F (36.7 C)  TempSrc:  Oral  Oral  SpO2:  98%  98%  Weight:   95.4 kg   Height:        GEN- The patient is well appearing, alert and oriented x 3 today.   HEENT: normocephalic, atraumatic; sclera clear, conjunctiva pink; hearing intact; oropharynx clear; neck supple  Lungs- Clear to ausculation bilaterally, normal work of breathing.  No wheezes, rales, rhonchi Heart- Regular rate and rhythm  GI- soft, non-tender, non-distended, bowel sounds present  Extremities- no clubbing, cyanosis, or edema  MS- no significant deformity or atrophy Skin- warm and dry, no rash or lesion, left chest without hematoma/ecchymosis Psych- euthymic mood, full affect Neuro- strength and sensation are intact   Labs:   Lab Results  Component Value Date   WBC 5.3 03/04/2018   HGB 12.2 (L) 03/04/2018   HCT 39.3 03/04/2018   MCV 95.9 03/04/2018   PLT 290 03/04/2018   No results for input(s): NA, K, CL, CO2, BUN, CREATININE, CALCIUM, PROT, BILITOT, ALKPHOS, ALT, AST, GLUCOSE in the last 168 hours.  Invalid input(s): LABALBU  Discharge Medications:  Allergies as of 03/25/2018   No Known Allergies     Medication List    TAKE these medications   albuterol 108 (90 Base) MCG/ACT inhaler Commonly known as:  PROVENTIL HFA;VENTOLIN HFA Inhale 2 puffs into the lungs every 4 (four) hours as needed for wheezing or shortness of breath (cough, shortness of breath or wheezing.).   atorvastatin 40 MG tablet Commonly known as:  LIPITOR Take 1 tablet (40 mg total) by mouth at bedtime.   azelastine 0.1 % nasal  spray Commonly known as:  ASTELIN Place 2 sprays into both nostrils 2 (two) times daily. Use in each nostril as directed What changed:    when to take this  reasons to take this  additional instructions   BAYER ASPIRIN EC LOW DOSE 81 MG EC tablet Generic drug:  aspirin Take 81 mg by mouth at bedtime.   BENEFIBER DRINK MIX PO Take 1 Dose by mouth daily as needed (for regularity). Use as  directed daily   budesonide-formoterol 160-4.5 MCG/ACT inhaler Commonly known as:  SYMBICORT TAKE 2 PUFFS BY MOUTH TWICE A DAY What changed:    how much to take  how to take this  when to take this   calcium-vitamin D 500-200 MG-UNIT tablet Commonly known as:  OSCAL WITH D Take 1 tablet by mouth 2 (two) times daily.   latanoprost 0.005 % ophthalmic solution Commonly known as:  XALATAN Place 1 drop into both eyes at bedtime.   multivitamin with minerals Tabs tablet Take 1 tablet by mouth at bedtime.   SIMBRINZA 1-0.2 % Susp Generic drug:  Brinzolamide-Brimonidine Place 1 drop into both eyes 3 (three) times daily.   venlafaxine XR 75 MG 24 hr capsule Commonly known as:  EFFEXOR-XR TAKE 1 CAPSULE BY MOUTH DAILY WITH BREAKFAST What changed:  See the new instructions.   vitamin C 500 MG tablet Commonly known as:  ASCORBIC ACID Take 500 mg by mouth at bedtime.   Vitamin D (Ergocalciferol) 1.25 MG (50000 UT) Caps capsule Commonly known as:  DRISDOL TAKE ONE CAPSULE BY MOUTH A WEEK What changed:  See the new instructions.       Disposition:  Discharge Instructions    Diet - low sodium heart healthy   Complete by:  As directed    Increase activity slowly   Complete by:  As directed      Follow-up Information    [place] [facility] Follow up.   Specialty:  Cardiology Why:  04/07/18 @ 4:00PM, wound check visit Contact information: [address], [address] [employer] [place]       Evans Lance, MD Follow up.   Specialty:  Cardiology Why:  06/26/18 @ 11:30AM Contact information: [address] [address] [phone]        Herminio Commons, MD Follow up.   Specialty:  Cardiology Why:  04/21/18 @ 11:40AM Contact information: [place] [address] [phone]           Duration of Discharge Encounter: Greater than 30 minutes including physician  time.  Signed, Chanetta Marshall, NP 03/25/2018 7:37 AM  EP Attending  Patient seen and examined. Agree with above. The patient feels better this morning. Device interrogation under my direction demonstrates normal DDD PM function. CXR looks good and incision with no hematoma. He will be discharged home with the usual followup.  Mikle Bosworth.D.

## 2018-03-25 NOTE — Progress Notes (Signed)
Orthopedic Tech Progress Note Patient Details:  Terry Love 1935/08/18 [id number]  Patient ID: [name], male   DOB: 07-21-1935, 82 y.o.   MRN: [id number] RN says pt has arm sling.  Karolee Stamps 03/25/2018, 9:33 AM

## 2018-04-03 ENCOUNTER — Telehealth: Payer: Self-pay | Admitting: Cardiology

## 2018-04-03 NOTE — Telephone Encounter (Signed)
Patient wife called and asked if pt home monitor was transmitting. I informed her that it has not transmitted since 03-30-2018. She stated that they do not get cell signal where they live and she will continue to take the monitor to town to transmit.

## 2018-04-07 ENCOUNTER — Ambulatory Visit (INDEPENDENT_AMBULATORY_CARE_PROVIDER_SITE_OTHER): Payer: Medicare Other | Admitting: Nurse Practitioner

## 2018-04-07 DIAGNOSIS — R001 Bradycardia, unspecified: Secondary | ICD-10-CM | POA: Diagnosis not present

## 2018-04-07 LAB — CUP PACEART INCLINIC DEVICE CHECK
Date Time Interrogation Session: 20191230161146
Implantable Lead Implant Date: 20191216
Implantable Lead Implant Date: 20191216
Implantable Lead Location: 753859
Implantable Lead Location: 753860
Implantable Lead Model: 377
Implantable Lead Model: 377
Implantable Lead Serial Number: 80840322
Implantable Lead Serial Number: 80924555
MDC IDC PG IMPLANT DT: 20191216
MDC IDC PG SERIAL: 69486427

## 2018-04-07 NOTE — Progress Notes (Signed)

## 2018-04-13 ENCOUNTER — Other Ambulatory Visit: Payer: Self-pay | Admitting: Family Medicine

## 2018-04-21 ENCOUNTER — Ambulatory Visit (INDEPENDENT_AMBULATORY_CARE_PROVIDER_SITE_OTHER): Payer: Medicare Other | Admitting: Cardiovascular Disease

## 2018-04-21 ENCOUNTER — Encounter: Payer: Self-pay | Admitting: Cardiovascular Disease

## 2018-04-21 VITALS — BP 120/72 | HR 69 | Ht 70.0 in | Wt 215.0 lb

## 2018-04-21 DIAGNOSIS — Z950 Presence of cardiac pacemaker: Secondary | ICD-10-CM | POA: Diagnosis not present

## 2018-04-21 DIAGNOSIS — R001 Bradycardia, unspecified: Secondary | ICD-10-CM

## 2018-04-21 DIAGNOSIS — E7800 Pure hypercholesterolemia, unspecified: Secondary | ICD-10-CM | POA: Diagnosis not present

## 2018-04-21 DIAGNOSIS — J988 Other specified respiratory disorders: Secondary | ICD-10-CM

## 2018-04-21 NOTE — Progress Notes (Addendum)
SUBJECTIVE: The patient presents for routine follow-up.  He underwent implantation of a Biotronik dual-chamber pacemaker for symptomatic bradycardia on 03/24/2018 by Dr. Lovena Le.  Exercise tolerance test on 03/05/2018 demonstrated chronotropic incompetence.  Echocardiogram on 03/05/2018 demonstrated normal left ventricular systolic function and regional wall motion, LVEF 60 to 65%, mild to moderate LVH, grade 1 diastolic dysfunction, and mild aortic regurgitation.  He is doing well today.  His energy levels have significantly improved.  He is a lot of questions about upper respiratory congestion and the use of over-the-counter supplements.      Review of Systems: As per "subjective", otherwise negative.  No Known Allergies  Current Outpatient Medications  Medication Sig Dispense Refill  . albuterol (PROVENTIL HFA;VENTOLIN HFA) 108 (90 Base) MCG/ACT inhaler Inhale 2 puffs into the lungs every 4 (four) hours as needed for wheezing or shortness of breath (cough, shortness of breath or wheezing.). 1 Inhaler 11  . aspirin (BAYER ASPIRIN EC LOW DOSE) 81 MG EC tablet Take 81 mg by mouth at bedtime.     Marland Kitchen atorvastatin (LIPITOR) 40 MG tablet Take 1 tablet (40 mg total) by mouth at bedtime. 90 tablet 1  . azelastine (ASTELIN) 0.1 % nasal spray Place 2 sprays into both nostrils 2 (two) times daily. Use in each nostril as directed (Patient taking differently: Place 2 sprays into both nostrils 2 (two) times daily as needed (allergies.). ) 30 mL 12  . Brinzolamide-Brimonidine (SIMBRINZA) 1-0.2 % SUSP Place 1 drop into both eyes 3 (three) times daily.    . budesonide-formoterol (SYMBICORT) 160-4.5 MCG/ACT inhaler TAKE 2 PUFFS BY MOUTH TWICE A DAY (Patient taking differently: Inhale 2 puffs into the lungs 2 (two) times daily. TAKE 2 PUFFS BY MOUTH TWICE A DAY) 10.2 Inhaler 2  . calcium-vitamin D (OSCAL WITH D) 500-200 MG-UNIT tablet Take 1 tablet by mouth 2 (two) times daily. 150 tablet 1  .  latanoprost (XALATAN) 0.005 % ophthalmic solution Place 1 drop into both eyes at bedtime.    . Multiple Vitamin (MULTIVITAMIN WITH MINERALS) TABS tablet Take 1 tablet by mouth at bedtime.    Marland Kitchen venlafaxine XR (EFFEXOR-XR) 75 MG 24 hr capsule TAKE 1 CAPSULE BY MOUTH DAILY WITH BREAKFAST 90 capsule 2  . vitamin C (ASCORBIC ACID) 500 MG tablet Take 500 mg by mouth at bedtime.     . Vitamin D, Ergocalciferol, (DRISDOL) 1.25 MG (50000 UT) CAPS capsule TAKE ONE CAPSULE BY MOUTH A WEEK (Patient taking differently: Take 50,000 Units by mouth every Monday. In the morning) 12 capsule 0  . Wheat Dextrin (BENEFIBER DRINK MIX PO) Take 1 Dose by mouth daily as needed (for regularity). Use as directed daily      No current facility-administered medications for this visit.     Past Medical History:  Diagnosis Date  . Anxiety   . Bradycardia    Chronic   . Colon polyps   . Depression   . Facial tic   . Hearing loss   . Hyperlipidemia   . 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 Obesity   . Seasonal allergies     Past Surgical History:  Procedure Laterality Date  . carpal tunnel  release right  2006  . COLONOSCOPY    . COLONOSCOPY  01/25/2011   Dr. Rehman:Examination performed to cecum Pan colonic diverticulosis/2 small polyps ablated via cold biopsy from transverse colon/External hemorrhoids, benign polyps  . COLONOSCOPY N/A 06/29/2013   Procedure: COLONOSCOPY;  Surgeon: Cristopher Estimable  Rourk, MD;  Location: [facility];  Service: Endoscopy;  Laterality: N/A;  10:45  . EYE SURGERY     left eye cataracts   . PACEMAKER IMPLANT N/A 03/24/2018   Procedure: PACEMAKER IMPLANT;  Surgeon: Evans Lance, MD;  Location: [facility];  Service: Cardiovascular;  Laterality: N/A;    Social History   Socioeconomic History  . Marital status: Married    Spouse name: Ceasar Lund   . Number of children: 0  . Years of education: 16  . Highest education level: 12th grade  Occupational History    . Occupation: retired  Scientific laboratory technician  . Financial resource strain: Not very hard  . Food insecurity:    Worry: Never true    Inability: Never true  . Transportation needs:    Medical: No    Non-medical: No  Tobacco Use  . Smoking status: Never Smoker  . Smokeless tobacco: Former Systems developer    Types: Chew  Substance and Sexual Activity  . Alcohol use: No    Alcohol/week: 0.0 standard drinks  . Drug use: No  . Sexual activity: Not Currently  Lifestyle  . Physical activity:    Days per week: 3 days    Minutes per session: 20 min  . Stress: Not at all  Relationships  . Social connections:    Talks on phone: Once a week    Gets together: Once a week    Attends religious service: 1 to 4 times per year    Active member of club or organization: Yes    Attends meetings of clubs or organizations: 1 to 4 times per year    Relationship status: Married  . Intimate partner violence:    Fear of current or ex partner: No    Emotionally abused: No    Physically abused: No    Forced sexual activity: No  Other Topics Concern  . Not on file  Social History Narrative  . Not on file     Vitals:   04/21/18 1145  BP: 120/72  Pulse: 69  SpO2: 96%  Weight: 215 lb (97.5 kg)  Height: 5\' 10"  (1.778 m)    Wt Readings from Last 3 Encounters:  04/21/18 215 lb (97.5 kg)  03/25/18 210 lb 6.4 oz (95.4 kg)  03/19/18 218 lb (98.9 kg)     PHYSICAL EXAM General: NAD HEENT: Normal. Neck: No JVD, no thyromegaly. Lungs: Clear to auscultation bilaterally with normal respiratory effort. CV: Regular rate and rhythm, normal S1/S2, no S3/S4, no murmur. No pretibial or periankle edema.  Abdomen: Soft, nontender, no distention.  Neurologic: Alert and oriented.  Psych: Normal affect. Skin: Normal. Musculoskeletal: No gross deformities.    ECG: Reviewed above under Subjective   Labs: Lab Results  Component Value Date/Time   K 3.8 03/04/2018 02:43 PM   BUN 16 03/04/2018 02:43 PM   CREATININE  0.91 03/04/2018 02:43 PM   CREATININE 0.81 01/30/2018 09:00 AM   ALT 18 03/04/2018 02:43 PM   TSH 1.194 03/04/2018 02:43 PM   TSH 1.09 01/30/2018 09:00 AM   HGB 12.2 (L) 03/04/2018 02:43 PM     Lipids: Lab Results  Component Value Date/Time   LDLCALC 68 09/03/2017 07:17 AM   CHOL 131 09/03/2017 07:17 AM   TRIG 56 09/03/2017 07:17 AM   HDL 49 09/03/2017 07:17 AM       ASSESSMENT AND PLAN: 1.  Symptomatic bradycardia status post pacemaker: Symptomatically stable.  Heart rate is normal.  Pacemaker functioning  normally.  He will follow-up with EP.  I told him he can stop aspirin as there is no indication for him to continue taking this.  2.  Lipids reviewed above.  Currently on statin therapy.  3.  Upper respiratory tract congestion: I recommend that he speak with his pharmacist about over-the-counter supplements which will not interact with his prescription medications as this is his primary concern.   Disposition: Follow up with EP as scheduled.  Follow-up with me as needed.   Kate Sable, M.D., F.A.C.C.

## 2018-04-21 NOTE — Patient Instructions (Signed)
Medication Instructions:  Your physician recommends that you continue on your current medications as directed. Please refer to the Current Medication list given to you today.  If you need a refill on your cardiac medications before your next appointment, please call your pharmacy.   Lab work: None today If you have labs (blood work) drawn today and your tests are completely normal, you will receive your results only by: Marland Kitchen MyChart Message (if you have MyChart) OR . A paper copy in the mail If you have any lab test that is abnormal or we need to change your treatment, we will call you to review the results.  Testing/Procedures: None  Follow-Up: as needed with Dr.Koneswaran

## 2018-04-25 ENCOUNTER — Other Ambulatory Visit: Payer: Self-pay | Admitting: Family Medicine

## 2018-04-25 ENCOUNTER — Telehealth: Payer: Self-pay | Admitting: *Deleted

## 2018-04-25 MED ORDER — PREDNISONE 5 MG PO TABS: 5.0000 mg | ORAL_TABLET | Freq: Two times a day (BID) | ORAL | 0 refills | Status: DC

## 2018-04-25 MED ORDER — BENZONATATE 100 MG PO CAPS: 100.0000 mg | ORAL_CAPSULE | Freq: Two times a day (BID) | ORAL | 0 refills | Status: DC | PRN

## 2018-04-25 NOTE — Telephone Encounter (Signed)
Please advise 

## 2018-04-25 NOTE — Telephone Encounter (Signed)
Will rx tessalon perle and short course of low dose pred, will work in next week if he has fever , chills or symptoms of INFECTION,pls let him know

## 2018-04-25 NOTE — Telephone Encounter (Signed)
Spoke with patient and advised of Dr.Simpson's treatment plan with verbal understanding

## 2018-04-25 NOTE — Progress Notes (Signed)
Scientist, forensic

## 2018-04-25 NOTE — Telephone Encounter (Signed)
Pt called wanting something called in. He has a lot of congestion. Some mucous is coming up but it is not dark in color. Feels like it is just in his chest. He was requesting a zpack as he doesn't want to wait until Monday and it turn into something worse.

## 2018-04-29 ENCOUNTER — Telehealth: Payer: Self-pay | Admitting: Family Medicine

## 2018-04-29 NOTE — Telephone Encounter (Signed)
Patient called back since he was almost finished with the med. No fever, chills or sign of infection but he reports wheezing at night and can feel some congestion in his chest. Offered appt for thurs but he has to check his schedule and thinks that is when he is going to the [place]. Just wanted to update you

## 2018-04-29 NOTE — Telephone Encounter (Signed)
Noted, thanks!

## 2018-04-29 NOTE — Telephone Encounter (Signed)
Pt is calling stating that he was suppose to call today in-regards to his medication from the 17th   Please call him at [phone] or [phone]

## 2018-04-29 NOTE — Telephone Encounter (Signed)
Notes documented on previous tele message.

## 2018-04-30 ENCOUNTER — Encounter: Payer: Self-pay | Admitting: Family Medicine

## 2018-04-30 ENCOUNTER — Ambulatory Visit (INDEPENDENT_AMBULATORY_CARE_PROVIDER_SITE_OTHER): Payer: Medicare Other | Admitting: Family Medicine

## 2018-04-30 VITALS — BP 128/82 | HR 68 | Resp 158 | Ht 70.0 in | Wt 217.0 lb

## 2018-04-30 DIAGNOSIS — E669 Obesity, unspecified: Secondary | ICD-10-CM | POA: Diagnosis not present

## 2018-04-30 DIAGNOSIS — J309 Allergic rhinitis, unspecified: Secondary | ICD-10-CM | POA: Diagnosis not present

## 2018-04-30 DIAGNOSIS — R058 Other specified cough: Secondary | ICD-10-CM

## 2018-04-30 DIAGNOSIS — R05 Cough: Secondary | ICD-10-CM | POA: Diagnosis not present

## 2018-04-30 DIAGNOSIS — F418 Other specified anxiety disorders: Secondary | ICD-10-CM | POA: Diagnosis not present

## 2018-04-30 MED ORDER — BENZONATATE 100 MG PO CAPS: 100.0000 mg | ORAL_CAPSULE | Freq: Two times a day (BID) | ORAL | 0 refills | Status: DC | PRN

## 2018-04-30 MED ORDER — PREDNISONE 5 MG PO TABS: 5.0000 mg | ORAL_TABLET | Freq: Two times a day (BID) | ORAL | 0 refills | Status: AC

## 2018-04-30 NOTE — Patient Instructions (Signed)
F/U in April as before, call if you need me sooner   Additional decongestant medication is sent in   Thankful that you continue to improve

## 2018-05-01 ENCOUNTER — Ambulatory Visit: Payer: Medicare Other | Admitting: Family Medicine

## 2018-05-03 ENCOUNTER — Encounter: Payer: Self-pay | Admitting: Family Medicine

## 2018-05-03 DIAGNOSIS — R05 Cough: Secondary | ICD-10-CM | POA: Insufficient documentation

## 2018-05-03 DIAGNOSIS — R059 Cough, unspecified: Secondary | ICD-10-CM | POA: Insufficient documentation

## 2018-05-03 NOTE — Assessment & Plan Note (Signed)
Increased symptoms contributing to dry cough, improving , additional prednisone is prescribed short term

## 2018-05-03 NOTE — Assessment & Plan Note (Signed)
Improved, managed by Psychiatry at [facility]

## 2018-05-03 NOTE — Assessment & Plan Note (Signed)
Improving, additional tessalon perle and prednisone prescribed No s/s of infection, pt reassured

## 2018-05-03 NOTE — Assessment & Plan Note (Signed)
Deteriorated. Patient re-educated about  the importance of commitment to a  minimum of 150 minutes of exercise per week.  The importance of healthy food choices with portion control discussed. Encouraged to start a food diary, count calories and to consider  joining a support group. Sample diet sheets offered. Goals set by the patient for the next several months.   Weight /BMI 04/30/2018 04/21/2018 03/25/2018  WEIGHT 217 lb 215 lb 210 lb 6.4 oz  HEIGHT 5\' 10"  5\' 10"  -  BMI 31.14 kg/m2 30.85 kg/m2 -

## 2018-05-03 NOTE — Progress Notes (Signed)
   Terry Love     MRN: [id number]      DOB: May 27, 1935   HPI Terry Love is here for follow up of cough and chest congestion. Started medication this past weekend, states much improved , but anxious not to get pneumonia , so he is here for re evaluation.He has had pneumonia in the past States feels 100% better, since the pacemaker and medication adjustment of his depression medication Preventive health is updated, specifically  Cancer screening and Immunization.   Questions or concerns regarding consultations or procedures which the PT has had in the interim are  addressed. .  There are no specific complaints   ROS Denies recent fever or chills. Denies sinus pressure, nasal congestion, ear pain or sore throat. Denies chest congestion, productive cough or wheezing. Denies chest pains, palpitations and leg swelling Denies abdominal pain, nausea, vomiting,diarrhea or constipation.   Denies dysuria, frequency, hesitancy or incontinence. Denies joint pain, swelling and limitation in mobility. Denies headaches, seizures, numbness, or tingling. Denies uncontrolled depression, anxiety or insomnia. Denies skin break down or rash.   PE  BP 128/82   Pulse 68   Resp (!) 158   Ht 5\' 10"  (1.778 m)   Wt 217 lb (98.4 kg)   SpO2 98%   BMI 31.14 kg/m   Patient alert and oriented and in no cardiopulmonary distress.  HEENT: No facial asymmetry, EOMI,   oropharynx pink and moist.  Neck supple no JVD, no mass.  Chest: Clear to auscultation bilaterally.  CVS: S1, S2 no murmurs, no S3.Regular rate.  ABD: Soft non tender.   Ext: No edema  MS: Adequate ROM spine, shoulders, hips and knees.  Skin: Intact, no ulcerations or rash noted.  Psych: Good eye contact, normal affect. Memory intact not anxious or depressed appearing.  CNS: CN 2-12 intact, marked hearing loss,power,  normal throughout.no focal deficits noted.   Assessment & Plan  Allergic rhinitis Increased symptoms  contributing to dry cough, improving , additional prednisone is prescribed short term  Allergic cough Improving, additional tessalon perle and prednisone prescribed No s/s of infection, pt reassured  Obesity (BMI 30.0-34.9) Deteriorated. Patient re-educated about  the importance of commitment to a  minimum of 150 minutes of exercise per week.  The importance of healthy food choices with portion control discussed. Encouraged to start a food diary, count calories and to consider  joining a support group. Sample diet sheets offered. Goals set by the patient for the next several months.   Weight /BMI 04/30/2018 04/21/2018 03/25/2018  WEIGHT 217 lb 215 lb 210 lb 6.4 oz  HEIGHT 5\' 10"  5\' 10"  -  BMI 31.14 kg/m2 30.85 kg/m2 -      Depression with anxiety Improved, managed by Psychiatry at [facility]

## 2018-05-08 ENCOUNTER — Ambulatory Visit: Payer: Medicare Other | Admitting: Gastroenterology

## 2018-05-08 ENCOUNTER — Encounter: Payer: Self-pay | Admitting: Nurse Practitioner

## 2018-05-08 ENCOUNTER — Ambulatory Visit (INDEPENDENT_AMBULATORY_CARE_PROVIDER_SITE_OTHER): Payer: Medicare Other | Admitting: Nurse Practitioner

## 2018-05-08 VITALS — BP 111/66 | HR 65 | Temp 97.8°F | Ht 70.0 in | Wt 216.8 lb

## 2018-05-08 DIAGNOSIS — D649 Anemia, unspecified: Secondary | ICD-10-CM | POA: Diagnosis not present

## 2018-05-08 NOTE — Patient Instructions (Signed)
Your health issues we discussed today were:   Anemia: 1. We will recheck your labs today. 2. We will call you with the results. 3. At that point we can decide if we want to just follow you and follow your labs, or if we need to do a procedure for evaluation 4. Call us if you see any significant bleeding in your stools or pitch black, tarry stools 5. Further recommendations will follow  Overall I recommend:  1. Follow-up in 3 months 2. Call us if you have any questions or concerns  At [facility] we value your feedback. You may receive a survey about your visit today. Please share your experience as we strive to create trusting relationships with our patients to provide genuine, compassionate, quality care.  We appreciate your understanding and patience as we review any laboratory studies, imaging, and other diagnostic tests that are ordered as we care for you. Our office policy is 5 business days for review of these results, and any emergent or urgent results are addressed in a timely manner for your best interest. If you do not hear from our office in 1 week, please contact us.   We also encourage the use of MyChart, which contains your medical information for your review as well. If you are not enrolled in this feature, an access code is on this after visit summary for your convenience. Thank you for allowing [place] to be involved in your care.  It was great to see you today!  I hope you have a great day!!

## 2018-05-08 NOTE — Progress Notes (Signed)
Primary Care Physician:  Fayrene Helper, MD Primary Gastroenterologist:  Dr. Gala Romney  Chief Complaint  Patient presents with  . Anemia    HPI:   Terry Love is a 83 y.o. male who presents for evaluation of anemia.  The patient has not been seen by our office since 06/11/2013 when he was seen for change in stool habits and family history of colon cancer.  At that time it was noted last colonoscopy October 2012 by Dr. Clyda Greener with a benign polyp, remote history of colon polyps and a positive family history in his mother and her later years.  Noticed a change in bowel habit for the previous 3 months with new onset constipation and associated bloating requiring laxatives.  Bowel movements are "90" and small pieces.  No abdominal pain or unexplained weight loss.  No rectal bleeding.  He is concerned overall about family history of colon cancer and would like a colonoscopy.  Heme-negative by primary care x1.  Recommended colonoscopy, further recommendations to follow.  Colonoscopy was completed 06/29/2013 which found a diminutive polyp at the rectum, a single 3 mm polyp in the mid sigmoid colon, and a single 5 mm polyp in the transverse colon otherwise normal.  Surgical pathology found the polyps to be a mix of hyperplastic and tubular adenoma.  Recommended Benefiber 2 teaspoons twice daily and repeat colonoscopy in 7 years if overall health permits.  No history of endoscopy in our system.  His current referral was made by primary care.  Reviewed previous PCP note dated 01/29/2018 for fatigue, anemia, and others.  At that time he noted he was having worsening fatigue and was told that he was anemic at the [place] and is concerned about this.  Recommended lab evaluation and referral to GI if no deficiency found.  Multiple labs ordered by primary care and drawn 01/30/2018.  These found normal folate, normal iron, normal ferritin, normal vitamin D, normal TSH, essentially normal CMP with a creatinine 0.81.   CBC found mild anemia with a hemoglobin of 12.8, normal indices.  Previous hemoglobin 8 months prior was 12.6.  Prior to that his baseline was 13.5 on average.  Today he states he's doing well overall. He had a PPM placed on 03/24/18 which helped his fatigue significantly (placed for bradycardia). Denies hematochezia, melena, abdominal pain, N/V, fever, chills, unintentional weight loss. Denies GERD symptoms and dysphagia symptoms. Denies chest pain, dyspnea, dizziness, lightheadedness, syncope, near syncope. Denies any other upper or lower GI symptoms.  Past Medical History:  Diagnosis Date  . Anxiety   . Bradycardia    Chronic   . Colon polyps   . Depression   . Facial tic   . Hearing loss   . Hyperlipidemia   . 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 Obesity   . Seasonal allergies     Past Surgical History:  Procedure Laterality Date  . carpal tunnel  release right  2006  . COLONOSCOPY    . COLONOSCOPY  01/25/2011   Dr. Rehman:Examination performed to cecum Pan colonic diverticulosis/2 small polyps ablated via cold biopsy from transverse colon/External hemorrhoids, benign polyps  . COLONOSCOPY N/A 06/29/2013   Procedure: COLONOSCOPY;  Surgeon: Daneil Dolin, MD;  Location: [facility];  Service: Endoscopy;  Laterality: N/A;  10:45  . EYE SURGERY     left eye cataracts   . PACEMAKER IMPLANT N/A 03/24/2018   Procedure: PACEMAKER IMPLANT;  Surgeon: Evans Lance, MD;  Location:  [facility];  Service: Cardiovascular;  Laterality: N/A;    Current Outpatient Medications  Medication Sig Dispense Refill  . albuterol (PROVENTIL HFA;VENTOLIN HFA) 108 (90 Base) MCG/ACT inhaler Inhale 2 puffs into the lungs every 4 (four) hours as needed for wheezing or shortness of breath (cough, shortness of breath or wheezing.). 1 Inhaler 11  . aspirin (BAYER ASPIRIN EC LOW DOSE) 81 MG EC tablet Take 81 mg by mouth at bedtime.     Marland Kitchen atorvastatin (LIPITOR) 40 MG tablet Take  1 tablet (40 mg total) by mouth at bedtime. 90 tablet 1  . azelastine (ASTELIN) 0.1 % nasal spray Place 2 sprays into both nostrils 2 (two) times daily. Use in each nostril as directed (Patient taking differently: Place 2 sprays into both nostrils 2 (two) times daily as needed (allergies.). ) 30 mL 12  . benzonatate (TESSALON) 100 MG capsule Take 1 capsule (100 mg total) by mouth 2 (two) times daily as needed for cough. 20 capsule 0  . Brinzolamide-Brimonidine (SIMBRINZA) 1-0.2 % SUSP Place 1 drop into both eyes 3 (three) times daily.    . budesonide-formoterol (SYMBICORT) 160-4.5 MCG/ACT inhaler TAKE 2 PUFFS BY MOUTH TWICE A DAY (Patient taking differently: Inhale 2 puffs into the lungs 2 (two) times daily. TAKE 2 PUFFS BY MOUTH TWICE A DAY) 10.2 Inhaler 2  . calcium-vitamin D (OSCAL WITH D) 500-200 MG-UNIT tablet Take 1 tablet by mouth 2 (two) times daily. 150 tablet 1  . latanoprost (XALATAN) 0.005 % ophthalmic solution Place 1 drop into both eyes at bedtime.    . Multiple Vitamin (MULTIVITAMIN WITH MINERALS) TABS tablet Take 1 tablet by mouth at bedtime.    Marland Kitchen venlafaxine XR (EFFEXOR-XR) 75 MG 24 hr capsule TAKE 1 CAPSULE BY MOUTH DAILY WITH BREAKFAST 90 capsule 2  . vitamin C (ASCORBIC ACID) 500 MG tablet Take 500 mg by mouth at bedtime.     . Vitamin D, Ergocalciferol, (DRISDOL) 1.25 MG (50000 UT) CAPS capsule TAKE ONE CAPSULE BY MOUTH A WEEK (Patient taking differently: Take 50,000 Units by mouth every Monday. In the morning) 12 capsule 0  . Wheat Dextrin (BENEFIBER DRINK MIX PO) Take 1 Dose by mouth daily as needed (for regularity). Use as directed daily      No current facility-administered medications for this visit.     Allergies as of 05/08/2018  . (No Known Allergies)    Family History  Problem Relation Age of Onset  . Colon cancer Mother        diagnosed at age 25  . Aneurysm Father   . Aneurysm Brother   . Stroke Brother     Social History   Socioeconomic History  .  Marital status: Married    Spouse name: Ceasar Lund   . Number of children: 0  . Years of education: 72  . Highest education level: 12th grade  Occupational History  . Occupation: retired  Scientific laboratory technician  . Financial resource strain: Not very hard  . Food insecurity:    Worry: Never true    Inability: Never true  . Transportation needs:    Medical: No    Non-medical: No  Tobacco Use  . Smoking status: Never Smoker  . Smokeless tobacco: Former Systems developer    Types: Chew  Substance and Sexual Activity  . Alcohol use: No    Alcohol/week: 0.0 standard drinks  . Drug use: No  . Sexual activity: Not Currently  Lifestyle  . Physical activity:    Days per  week: 3 days    Minutes per session: 20 min  . Stress: Not at all  Relationships  . Social connections:    Talks on phone: Once a week    Gets together: Once a week    Attends religious service: 1 to 4 times per year    Active member of club or organization: Yes    Attends meetings of clubs or organizations: 1 to 4 times per year    Relationship status: Married  . Intimate partner violence:    Fear of current or ex partner: No    Emotionally abused: No    Physically abused: No    Forced sexual activity: No  Other Topics Concern  . Not on file  Social History Narrative  . Not on file    Review of Systems: General: Negative for anorexia, weight loss, fever, chills, fatigue, weakness. ENT: Negative for hoarseness, difficulty swallowing. CV: Negative for chest pain, angina, palpitations, peripheral edema.  Respiratory: Negative for dyspnea at rest, cough, sputum, wheezing.  GI: See history of present illness.  MS: Negative for joint pain, low back pain.  Derm: Negative for rash or itching.  Endo: Negative for unusual weight change.  Heme: Negative for bruising or bleeding. Allergy: Negative for rash or hives.    Physical Exam: BP 111/66   Pulse 65   Temp 97.8 F (36.6 C) (Oral)   Ht 5\' 10"  (1.778 m)   Wt 216 lb 12.8 oz  (98.3 kg)   BMI 31.11 kg/m  General:   Alert and oriented. Pleasant and cooperative. Well-nourished and well-developed.  Head:  Normocephalic and atraumatic. Eyes:  Without icterus, sclera clear and conjunctiva pink.  Ears:  Normal auditory acuity. Cardiovascular:  S1, S2 present without murmurs appreciated. Extremities without clubbing or edema. Respiratory:  Clear to auscultation bilaterally. No wheezes, rales, or rhonchi. No distress.  Gastrointestinal:  +BS, soft, non-tender and non-distended. No HSM noted. No guarding or rebound. No masses appreciated.  Rectal:  Deferred  Musculoskalatal:  Symmetrical without gross deformities. Neurologic:  Alert and oriented x4;  grossly normal neurologically. Psych:  Alert and cooperative. Normal mood and affect. Heme/Lymph/Immune: No excessive bruising noted.    05/08/2018 12:00 PM   Disclaimer: This note was dictated with voice recognition software. Similar sounding words can inadvertently be transcribed and may not be corrected upon review.

## 2018-05-08 NOTE — Assessment & Plan Note (Signed)
The patient was referred to [place] for mild anemia with a hemoglobin of 12.8 with normal indices, normal iron, normal ferritin, normal folate, normal B12.  His creatinine was normal as well.  He was having significant bradycardia which is contributing to his fatigue.  Now he has had a pacemaker placed his fatigue has improved.  No labs in 3 months.  Last colonoscopy 5 years ago with a recommended 7-year repeat if health permits given family history of colon cancer.  No known endoscopy previously.  No upper GI symptoms.  In fact, he is generally asymptomatic from a GI standpoint.    At this point I will recheck his CBC and ferritin for any change.  We can consider possible need for colonoscopy and endoscopy based on his results.  Alternatively, given his age and the mild nature of his anemia we could follow clinically or refer to hematology for further work-up.  We can make decisions after his lab results are known.  Overall, follow-up in 3 months.

## 2018-05-09 ENCOUNTER — Telehealth: Payer: Self-pay | Admitting: Cardiology

## 2018-05-09 LAB — CBC WITH DIFFERENTIAL/PLATELET
Absolute Monocytes: 515 cells/uL (ref 200–950)
BASOS PCT: 1.5 %
Basophils Absolute: 117 cells/uL (ref 0–200)
Eosinophils Absolute: 499 cells/uL (ref 15–500)
Eosinophils Relative: 6.4 %
HCT: 38 % — ABNORMAL LOW (ref 38.5–50.0)
Hemoglobin: 12.8 g/dL — ABNORMAL LOW (ref 13.2–17.1)
Lymphs Abs: 2239 cells/uL (ref 850–3900)
MCH: 30.8 pg (ref 27.0–33.0)
MCHC: 33.7 g/dL (ref 32.0–36.0)
MCV: 91.6 fL (ref 80.0–100.0)
MPV: 9.7 fL (ref 7.5–12.5)
Monocytes Relative: 6.6 %
Neutro Abs: 4430 cells/uL (ref 1500–7800)
Neutrophils Relative %: 56.8 %
PLATELETS: 342 10*3/uL (ref 140–400)
RBC: 4.15 10*6/uL — ABNORMAL LOW (ref 4.20–5.80)
RDW: 11.3 % (ref 11.0–15.0)
Total Lymphocyte: 28.7 %
WBC: 7.8 10*3/uL (ref 3.8–10.8)

## 2018-05-09 LAB — FERRITIN: FERRITIN: 145 ng/mL (ref 24–380)

## 2018-05-09 NOTE — Telephone Encounter (Signed)
LMOVM for pt to return call. Pt [employer] home monitor has been disconnected since 04-18-2018.

## 2018-05-09 NOTE — Telephone Encounter (Signed)
I spoke with pt wife and gave her the number to [employer] tech support.

## 2018-05-12 NOTE — Progress Notes (Signed)
CC'D TO PCP °

## 2018-05-30 ENCOUNTER — Other Ambulatory Visit: Payer: Self-pay | Admitting: Family Medicine

## 2018-05-31 ENCOUNTER — Other Ambulatory Visit: Payer: Self-pay | Admitting: Family Medicine

## 2018-06-02 ENCOUNTER — Telehealth: Payer: Self-pay | Admitting: Family Medicine

## 2018-06-02 NOTE — Telephone Encounter (Signed)
Noted, thanks!

## 2018-06-02 NOTE — Telephone Encounter (Signed)
Pt is coming in BP is running higher than normal, (usually low BP) I have made an appt with Dr Moshe Cipro 2-25, I advised PT that if he experienced any numbness, pain, tingling, to go straight to the ER

## 2018-06-03 ENCOUNTER — Ambulatory Visit (INDEPENDENT_AMBULATORY_CARE_PROVIDER_SITE_OTHER): Payer: Medicare Other | Admitting: Family Medicine

## 2018-06-03 ENCOUNTER — Encounter: Payer: Self-pay | Admitting: Family Medicine

## 2018-06-03 VITALS — BP 96/64 | HR 64 | Resp 12 | Ht 71.0 in | Wt 216.0 lb

## 2018-06-03 DIAGNOSIS — E669 Obesity, unspecified: Secondary | ICD-10-CM

## 2018-06-03 DIAGNOSIS — R0989 Other specified symptoms and signs involving the circulatory and respiratory systems: Secondary | ICD-10-CM | POA: Diagnosis not present

## 2018-06-03 DIAGNOSIS — I495 Sick sinus syndrome: Secondary | ICD-10-CM

## 2018-06-03 DIAGNOSIS — E785 Hyperlipidemia, unspecified: Secondary | ICD-10-CM

## 2018-06-03 MED ORDER — MONTELUKAST SODIUM 10 MG PO TABS: 10.0000 mg | ORAL_TABLET | Freq: Every day | ORAL | 3 refills | Status: DC

## 2018-06-03 NOTE — Patient Instructions (Addendum)
F/U I as before , call if you need me sooner  Please do not use OTC medication for allerg8ies as this often raiswes the blood pressure  I have prescribed singulair(generic)     Continue checking home blood pressure and if you stil have concerns please call in for referral

## 2018-06-03 NOTE — Progress Notes (Signed)
   Terry Love     MRN: [id number]      DOB: 03-04-1936   HPI [name] is here with a 1 h/io increased blood pressure over the past 4 days, up to 170's unclear why it qwould be high, and c/o bilateral red eyes  And headache' After recall , they both remember him taking alk seltzer and OTC medication for his symptoms over the weekend when his blood blood pressure ws high ROS Denies recent fever or chills. Denies sinus pressure, nasal congestion, ear pain or sore throat. Denies chest congestion, productive cough or wheezing. Denies chest pains, palpitations and leg swelling Denies abdominal pain, nausea, vomiting,diarrhea or constipation.   Denies dysuria, frequency, hesitancy or incontinence. Denies joint pain, swelling and limitation in mobility. Denies , seizures, numbness, or tingling. Denies uncontrolled depression, anxiety or insomnia. Denies skin break down or rash.   PE  BP 96/64   Pulse 64   Resp 12   Ht '5\' 11"'$  (1.803 m)   Wt 216 lb (98 kg)   SpO2 96%   BMI 30.13 kg/m   Patient alert and oriented and in no cardiopulmonary distress.  HEENT: No facial asymmetry, EOMI,   oropharynx pink and moist.  Neck supple no JVD, no mass.  Chest: Clear to auscultation bilaterally.  CVS: S1, S2 no murmurs, no S3.Regular rate.  ABD: Soft non tender.   Ext: No edema  MS: Adequate though reduced  ROM spine, shoulders, hips and knees.  Skin: Intact, no ulcerations or rash noted.  Psych: Good eye contact, normal affect. Memory intact not anxious or depressed appearing.  CNS: CN 2-12 intact, power,  normal throughout.no focal deficits noted.   Assessment & Plan  Labile blood pressure Spouse reports elevated BP this past weekend, generally pt I has normal or low blood pressure, he is on no antihypertensive medication and has never needed any, he denies any light headedness. BP reading this am at home co relates with my reading in the office  Hyperlipidemia LDL goal  <100 Hyperlipidemia:Low fat diet discussed and encouraged.   Lipid Panel  Lab Results  Component Value Date   CHOL 131 09/03/2017   HDL 49 09/03/2017   LDLCALC 68 09/03/2017   TRIG 56 09/03/2017   CHOLHDL 2.7 09/03/2017  Controlled, no change in medication      Obesity (BMI 30.0-34.9) Unchanged  Patient re-educated about  the importance of commitment to a  minimum of 150 minutes of exercise per week as able.  The importance of healthy food choices with portion control discussed, as well as eating regularly and within a 12 hour window most days. The need to choose "clean , green" food 50 to 75% of the time is discussed, as well as to make water the primary drink and set a goal of 64 ounces water daily.  Encouraged to start a food diary,  and to consider  joining a support group. Sample diet sheets offered. Goals set by the patient for the next several months.   Weight /BMI 06/03/2018 05/08/2018 04/30/2018  WEIGHT 216 lb 216 lb 12.8 oz 217 lb  HEIGHT '5\' 11"'$  '5\' 10"'$  '5\' 10"'$   BMI 30.13 kg/m2 31.11 kg/m2 31.14 kg/m2      Sinus node dysfunction (HCC) Regular rate of 64

## 2018-06-04 ENCOUNTER — Encounter: Payer: Self-pay | Admitting: Family Medicine

## 2018-06-04 DIAGNOSIS — R0989 Other specified symptoms and signs involving the circulatory and respiratory systems: Secondary | ICD-10-CM | POA: Insufficient documentation

## 2018-06-04 NOTE — Assessment & Plan Note (Signed)
Spouse reports elevated BP this past weekend, generally pt I has normal or low blood pressure, he is on no antihypertensive medication and has never needed any, he denies any light headedness. BP reading this am at home co relates with my reading in the office

## 2018-06-04 NOTE — Assessment & Plan Note (Signed)
Hyperlipidemia:Low fat diet discussed and encouraged.   Lipid Panel  Lab Results  Component Value Date   CHOL 131 09/03/2017   HDL 49 09/03/2017   LDLCALC 68 09/03/2017   TRIG 56 09/03/2017   CHOLHDL 2.7 09/03/2017  Controlled, no change in medication

## 2018-06-04 NOTE — Assessment & Plan Note (Addendum)
Regular rate of 64

## 2018-06-04 NOTE — Assessment & Plan Note (Signed)
Unchanged  Patient re-educated about  the importance of commitment to a  minimum of 150 minutes of exercise per week as able.  The importance of healthy food choices with portion control discussed, as well as eating regularly and within a 12 hour window most days. The need to choose "clean , green" food 50 to 75% of the time is discussed, as well as to make water the primary drink and set a goal of 64 ounces water daily.  Encouraged to start a food diary,  and to consider  joining a support group. Sample diet sheets offered. Goals set by the patient for the next several months.   Weight /BMI 06/03/2018 05/08/2018 04/30/2018  WEIGHT 216 lb 216 lb 12.8 oz 217 lb  HEIGHT 5\' 11"  5\' 10"  5\' 10"   BMI 30.13 kg/m2 31.11 kg/m2 31.14 kg/m2

## 2018-06-11 ENCOUNTER — Encounter: Payer: Self-pay | Admitting: Internal Medicine

## 2018-06-21 ENCOUNTER — Other Ambulatory Visit: Payer: Self-pay | Admitting: Family Medicine

## 2018-06-26 ENCOUNTER — Encounter: Payer: Medicare Other | Admitting: Internal Medicine

## 2018-07-17 DIAGNOSIS — M5413 Radiculopathy, cervicothoracic region: Secondary | ICD-10-CM | POA: Diagnosis not present

## 2018-07-17 DIAGNOSIS — M546 Pain in thoracic spine: Secondary | ICD-10-CM | POA: Diagnosis not present

## 2018-07-17 DIAGNOSIS — M9901 Segmental and somatic dysfunction of cervical region: Secondary | ICD-10-CM | POA: Diagnosis not present

## 2018-07-17 DIAGNOSIS — M9902 Segmental and somatic dysfunction of thoracic region: Secondary | ICD-10-CM | POA: Diagnosis not present

## 2018-07-21 ENCOUNTER — Ambulatory Visit: Payer: Medicare Other | Admitting: Family Medicine

## 2018-07-21 DIAGNOSIS — M9901 Segmental and somatic dysfunction of cervical region: Secondary | ICD-10-CM | POA: Diagnosis not present

## 2018-07-21 DIAGNOSIS — M9902 Segmental and somatic dysfunction of thoracic region: Secondary | ICD-10-CM | POA: Diagnosis not present

## 2018-07-21 DIAGNOSIS — M546 Pain in thoracic spine: Secondary | ICD-10-CM | POA: Diagnosis not present

## 2018-07-21 DIAGNOSIS — M5413 Radiculopathy, cervicothoracic region: Secondary | ICD-10-CM | POA: Diagnosis not present

## 2018-07-25 DIAGNOSIS — M546 Pain in thoracic spine: Secondary | ICD-10-CM | POA: Diagnosis not present

## 2018-07-25 DIAGNOSIS — M5413 Radiculopathy, cervicothoracic region: Secondary | ICD-10-CM | POA: Diagnosis not present

## 2018-07-25 DIAGNOSIS — M9902 Segmental and somatic dysfunction of thoracic region: Secondary | ICD-10-CM | POA: Diagnosis not present

## 2018-07-25 DIAGNOSIS — M9901 Segmental and somatic dysfunction of cervical region: Secondary | ICD-10-CM | POA: Diagnosis not present

## 2018-07-28 DIAGNOSIS — M9902 Segmental and somatic dysfunction of thoracic region: Secondary | ICD-10-CM | POA: Diagnosis not present

## 2018-07-28 DIAGNOSIS — M5413 Radiculopathy, cervicothoracic region: Secondary | ICD-10-CM | POA: Diagnosis not present

## 2018-07-28 DIAGNOSIS — M9901 Segmental and somatic dysfunction of cervical region: Secondary | ICD-10-CM | POA: Diagnosis not present

## 2018-07-28 DIAGNOSIS — M546 Pain in thoracic spine: Secondary | ICD-10-CM | POA: Diagnosis not present

## 2018-07-30 ENCOUNTER — Ambulatory Visit (HOSPITAL_COMMUNITY)
Admission: RE | Admit: 2018-07-30 | Discharge: 2018-07-30 | Disposition: A | Discharge status: Home / Self Care | Payer: Medicare Other | Source: Ambulatory Visit | Attending: Family Medicine | Admitting: Family Medicine

## 2018-07-30 ENCOUNTER — Encounter: Payer: Self-pay | Admitting: Family Medicine

## 2018-07-30 ENCOUNTER — Ambulatory Visit (INDEPENDENT_AMBULATORY_CARE_PROVIDER_SITE_OTHER): Payer: Medicare Other | Admitting: Family Medicine

## 2018-07-30 ENCOUNTER — Other Ambulatory Visit: Payer: Self-pay

## 2018-07-30 ENCOUNTER — Other Ambulatory Visit (HOSPITAL_COMMUNITY)
Admission: RE | Admit: 2018-07-30 | Discharge: 2018-07-30 | Disposition: A | Discharge status: Home / Self Care | Payer: Medicare Other | Source: Ambulatory Visit | Attending: Family Medicine | Admitting: Family Medicine

## 2018-07-30 VITALS — BP 136/68 | Ht 71.0 in | Wt 214.0 lb

## 2018-07-30 DIAGNOSIS — E559 Vitamin D deficiency, unspecified: Secondary | ICD-10-CM | POA: Diagnosis not present

## 2018-07-30 DIAGNOSIS — M9902 Segmental and somatic dysfunction of thoracic region: Secondary | ICD-10-CM | POA: Diagnosis not present

## 2018-07-30 DIAGNOSIS — M542 Cervicalgia: Secondary | ICD-10-CM

## 2018-07-30 DIAGNOSIS — M5030 Other cervical disc degeneration, unspecified cervical region: Secondary | ICD-10-CM | POA: Diagnosis not present

## 2018-07-30 DIAGNOSIS — E785 Hyperlipidemia, unspecified: Secondary | ICD-10-CM | POA: Insufficient documentation

## 2018-07-30 DIAGNOSIS — R0989 Other specified symptoms and signs involving the circulatory and respiratory systems: Secondary | ICD-10-CM

## 2018-07-30 DIAGNOSIS — Z950 Presence of cardiac pacemaker: Secondary | ICD-10-CM

## 2018-07-30 DIAGNOSIS — F418 Other specified anxiety disorders: Secondary | ICD-10-CM

## 2018-07-30 DIAGNOSIS — G8929 Other chronic pain: Secondary | ICD-10-CM

## 2018-07-30 DIAGNOSIS — Z125 Encounter for screening for malignant neoplasm of prostate: Secondary | ICD-10-CM | POA: Diagnosis not present

## 2018-07-30 DIAGNOSIS — M25511 Pain in right shoulder: Secondary | ICD-10-CM | POA: Diagnosis not present

## 2018-07-30 DIAGNOSIS — M546 Pain in thoracic spine: Secondary | ICD-10-CM | POA: Diagnosis not present

## 2018-07-30 DIAGNOSIS — M9901 Segmental and somatic dysfunction of cervical region: Secondary | ICD-10-CM | POA: Diagnosis not present

## 2018-07-30 DIAGNOSIS — M5413 Radiculopathy, cervicothoracic region: Secondary | ICD-10-CM | POA: Diagnosis not present

## 2018-07-30 LAB — COMPREHENSIVE METABOLIC PANEL
ALT: 31 U/L (ref 0–44)
AST: 39 U/L (ref 15–41)
Albumin: 4.2 g/dL (ref 3.5–5.0)
Alkaline Phosphatase: 47 U/L (ref 38–126)
Anion gap: 7 (ref 5–15)
BUN: 21 mg/dL (ref 8–23)
CO2: 24 mmol/L (ref 22–32)
Calcium: 9.1 mg/dL (ref 8.9–10.3)
Chloride: 108 mmol/L (ref 98–111)
Creatinine, Ser: 0.71 mg/dL (ref 0.61–1.24)
GFR calc Af Amer: >60 mL/min (ref >60)
GFR calc non Af Amer: >60 mL/min (ref >60)
Glucose, Bld: 128 mg/dL — ABNORMAL HIGH (ref 70–99)
Potassium: 3.7 mmol/L (ref 3.5–5.1)
Sodium: 139 mmol/L (ref 135–145)
Total Bilirubin: 0.5 mg/dL (ref 0.3–1.2)
Total Protein: 7.6 g/dL (ref 6.5–8.1)

## 2018-07-30 LAB — LIPID PANEL
Cholesterol: 121 mg/dL (ref 0–200)
HDL: 52 mg/dL (ref >40)
LDL Cholesterol: 54 mg/dL (ref 0–99)
Total CHOL/HDL Ratio: 2.3 RATIO
Triglycerides: 75 mg/dL (ref <150)
VLDL: 15 mg/dL (ref 0–40)

## 2018-07-30 LAB — PSA: Prostatic Specific Antigen: 0.09 ng/mL (ref 0.00–4.00)

## 2018-07-30 MED ORDER — IBUPROFEN 600 MG PO TABS: ORAL_TABLET | ORAL | 0 refills | Status: DC

## 2018-07-30 MED ORDER — FAMOTIDINE 20 MG PO TABS: 20.0000 mg | ORAL_TABLET | Freq: Every day | ORAL | 0 refills | Status: DC

## 2018-07-30 MED ORDER — PREDNISONE 10 MG PO TABS: 10.0000 mg | ORAL_TABLET | Freq: Two times a day (BID) | ORAL | 0 refills | Status: AC

## 2018-07-30 NOTE — Progress Notes (Signed)
Virtual Visit via Telephone Note  I connected with Terry Love on 07/30/18 at  1:20 PM EDT by telephone and verified that I am speaking with the correct person using two identifiers.   I discussed the limitations, risks, security and privacy concerns of performing an evaluation and management service by telephone and the availability of in person appointments. I also discussed with the patient that there may be a patient responsible charge related to this service. The patient expressed understanding and agreed to proceed. Patient is in his home and I am in my office, no webex avail;able in patient's home   History of Present Illness: Pain in right shoulder and right side of neck generally up to a 7 or 8, had  4 sessions x 1 week Reports weakness in right upper ext and in the grip also. Has been going to Chiropractor in the past 2 weeks to address this, states he has full ROM of shoulder , but was advised by the Chiropractor , that he may need  Imaging to determine underlying problem as he is remaining significantly symptomatic State s his depression is not doing well though wif is not fully in agreement . He is not suicidal or homicidal, feeling is that the current pandemic has caused worsening of  Is depression , and he has not been receiving therapy as he had been in the past Denies recent fever or chills. Denies sinus pressure, nasal congestion, ear pain or sore throat. Denies chest congestion, productive cough or wheezing. Denies chest pains, palpitations and leg swelling Denies abdominal pain, nausea, vomiting,diarrhea or constipation.   Denies dysuria, frequency, hesitancy or incontinence.  Denies skin break down or rash.        Observations/Objective:  BP 136/68   Ht 5\' 11"  (1.803 m)   Wt 214 lb (97.1 kg)   BMI 29.85 kg/m   Assessment and Plan:  Shoulder pain, right 2 week h/o increased pain of right shoulder, no known trauma,  No significant symptom improvement  despite Chiropractor, reports full ROM of shoulder, symptom of pain radiaites from neck to right hand , needs imaging of neck  Neck pain on right side 2 week h/o increased neck pain radiating to right shoulder and hand, suspect arthritis and disc disease, needs X ray and short course of anti inflammatory  Hyperlipidemia LDL goal <100 Hyperlipidemia:Low fat diet discussed and encouraged.   Lipid Panel  Lab Results  Component Value Date   CHOL 121 07/30/2018   HDL 52 07/30/2018   LDLCALC 54 07/30/2018   TRIG 75 07/30/2018   CHOLHDL 2.3 07/30/2018   Controlled, no change in medication '    Depression with anxiety Inadequately controlled, however not suicidal or homicidal, encouraged pt to seek out therapy through the [place] where he sees Psychiatry  Pacemaker No complications from placemeaker and functioning well   Follow Up Instructions:    I discussed the assessment and treatment plan with the patient. The patient was provided an opportunity to ask questions and all were answered. The patient agreed with the plan and demonstrated an understanding of the instructions.   The patient was advised to call back or seek an in-person evaluation if the symptoms worsen or if the condition fails to improve as anticipated.  I provided 22 minutes of non-face-to-face time during this encounter.   Tula Nakayama, MD

## 2018-07-30 NOTE — Patient Instructions (Addendum)
F/u in 5.5 months, with mD call if you need me before  Please get X ray of neck and right shoulder today, and 3 medications, prednisone,  Ibuprofen and famotidine are prescribed  Fasting lipid, cmp amd EGFr, PSA and vit D levels first week of June    Please contact [facility] for therapy for depression   It is important that you exercise regularly at least 30 minutes 5 times a week. If you develop chest pain, have severe difficulty breathing, or feel very tired, stop exercising immediately and seek medical attention     Social distancing. Frequent hand washing with soap and water Keeping your hands off of your face.wear face  Mask outside of your home These 3 practices will help to keep both you and your community healthy during this time. Please practice them faithfully!

## 2018-07-31 ENCOUNTER — Telehealth: Payer: Self-pay

## 2018-07-31 DIAGNOSIS — R7301 Impaired fasting glucose: Secondary | ICD-10-CM

## 2018-07-31 LAB — VITAMIN D 25 HYDROXY (VIT D DEFICIENCY, FRACTURES): Vit D, 25-Hydroxy: 44.8 ng/mL (ref 30.0–100.0)

## 2018-07-31 NOTE — Telephone Encounter (Signed)
-----   Message from Fayrene Helper, MD sent at 07/30/2018  4:17 PM EDT ----- Pls let pt know x ray of neck shows arthritis at several levels down his c spine , and seems as though he may have mild disc disease

## 2018-08-01 DIAGNOSIS — M9903 Segmental and somatic dysfunction of lumbar region: Secondary | ICD-10-CM | POA: Diagnosis not present

## 2018-08-01 DIAGNOSIS — M9901 Segmental and somatic dysfunction of cervical region: Secondary | ICD-10-CM | POA: Diagnosis not present

## 2018-08-01 DIAGNOSIS — M546 Pain in thoracic spine: Secondary | ICD-10-CM | POA: Diagnosis not present

## 2018-08-01 DIAGNOSIS — M9902 Segmental and somatic dysfunction of thoracic region: Secondary | ICD-10-CM | POA: Diagnosis not present

## 2018-08-01 DIAGNOSIS — M5413 Radiculopathy, cervicothoracic region: Secondary | ICD-10-CM | POA: Diagnosis not present

## 2018-08-01 LAB — HEMOGLOBIN A1C
Hgb A1c MFr Bld: 5.3 % of total Hgb (ref <5.7)
Mean Plasma Glucose: 105 (calc)
eAG (mmol/L): 5.8 (calc)

## 2018-08-02 DIAGNOSIS — G8929 Other chronic pain: Secondary | ICD-10-CM | POA: Insufficient documentation

## 2018-08-02 DIAGNOSIS — M542 Cervicalgia: Secondary | ICD-10-CM

## 2018-08-02 DIAGNOSIS — Z950 Presence of cardiac pacemaker: Secondary | ICD-10-CM | POA: Insufficient documentation

## 2018-08-02 NOTE — Assessment & Plan Note (Signed)
Hyperlipidemia:Low fat diet discussed and encouraged.   Lipid Panel  Lab Results  Component Value Date   CHOL 121 07/30/2018   HDL 52 07/30/2018   LDLCALC 54 07/30/2018   TRIG 75 07/30/2018   CHOLHDL 2.3 07/30/2018   Controlled, no change in medication '

## 2018-08-02 NOTE — Assessment & Plan Note (Signed)
2 week h/o increased pain of right shoulder, no known trauma,  No significant symptom improvement despite Chiropractor, reports full ROM of shoulder, symptom of pain radiaites from neck to right hand , needs imaging of neck

## 2018-08-02 NOTE — Assessment & Plan Note (Signed)
Inadequately controlled, however not suicidal or homicidal, encouraged pt to seek out therapy through the [place] where he sees Psychiatry

## 2018-08-02 NOTE — Assessment & Plan Note (Signed)
No complications from placemeaker and functioning well

## 2018-08-02 NOTE — Assessment & Plan Note (Signed)
2 week h/o increased neck pain radiating to right shoulder and hand, suspect arthritis and disc disease, needs X ray and short course of anti inflammatory

## 2018-08-06 DIAGNOSIS — M546 Pain in thoracic spine: Secondary | ICD-10-CM | POA: Diagnosis not present

## 2018-08-06 DIAGNOSIS — M5413 Radiculopathy, cervicothoracic region: Secondary | ICD-10-CM | POA: Diagnosis not present

## 2018-08-06 DIAGNOSIS — M9902 Segmental and somatic dysfunction of thoracic region: Secondary | ICD-10-CM | POA: Diagnosis not present

## 2018-08-06 DIAGNOSIS — M9901 Segmental and somatic dysfunction of cervical region: Secondary | ICD-10-CM | POA: Diagnosis not present

## 2018-08-06 DIAGNOSIS — M9903 Segmental and somatic dysfunction of lumbar region: Secondary | ICD-10-CM | POA: Diagnosis not present

## 2018-08-07 ENCOUNTER — Other Ambulatory Visit: Payer: Self-pay

## 2018-08-07 ENCOUNTER — Other Ambulatory Visit: Payer: Self-pay | Admitting: Family Medicine

## 2018-08-07 ENCOUNTER — Ambulatory Visit (INDEPENDENT_AMBULATORY_CARE_PROVIDER_SITE_OTHER): Payer: Medicare Other | Admitting: Nurse Practitioner

## 2018-08-07 ENCOUNTER — Encounter: Payer: Self-pay | Admitting: Internal Medicine

## 2018-08-07 ENCOUNTER — Encounter: Payer: Self-pay | Admitting: Nurse Practitioner

## 2018-08-07 VITALS — BP 121/66 | HR 68 | Temp 97.6°F | Ht 70.0 in | Wt 210.0 lb

## 2018-08-07 DIAGNOSIS — D649 Anemia, unspecified: Secondary | ICD-10-CM

## 2018-08-07 NOTE — Assessment & Plan Note (Signed)
Currently doing well.  Denies hematochezia or melena.  No other signs of bleeding.  Previous labs checked in January showed incremental improvement in hemoglobin and was 12.8.  Colonoscopy up-to-date.  At this point I do not feel there is a need for upper endoscopy.  We can further evaluate with other endoscopic evaluation pending follow-up labs.  At this point I will have him monitor symptoms, continue current medications.  Follow-up in 3 months per office visit and to update labs (72-month interval labs) including CBC and ferritin.  If there is any concern about GI bleed we can check a iFOBT at that time.

## 2018-08-07 NOTE — Patient Instructions (Signed)
Your health issues we discussed today were:   Anemia: 1. It looks like your anemia is doing well.  Your last labs showed an improvement 2. Let us know if you have any fatigue, weakness, dizziness, passing out 3. Call us if you have any other symptoms that concern you such as blood in your stools  Overall I recommend:  1. Continue your other current medications 2. Call us if you have any questions or concerns 3. Return for follow-up in 3 months and we will recheck labs at that time   Because of recent events of COVID-19 ("Coronavirus"), follow CDC recommendations:  1. Wash your hand frequently 2. Avoid touching your face 3. Stay away from people who are sick 4. If you have symptoms such as fever, cough, shortness of breath then call your healthcare provider for further guidance 5. If you are sick, STAY AT HOME unless otherwise directed by your healthcare provider. 6. Follow directions from state and national officials regarding staying safe   At [facility] we value your feedback. You may receive a survey about your visit today. Please share your experience as we strive to create trusting relationships with our patients to provide genuine, compassionate, quality care.  We appreciate your understanding and patience as we review any laboratory studies, imaging, and other diagnostic tests that are ordered as we care for you. Our office policy is 5 business days for review of these results, and any emergent or urgent results are addressed in a timely manner for your best interest. If you do not hear from our office in 1 week, please contact us.   We also encourage the use of MyChart, which contains your medical information for your review as well. If you are not enrolled in this feature, an access code is on this after visit summary for your convenience. Thank you for allowing [place] to be involved in your care.  It was great to see you today!  I hope you have a great day!!

## 2018-08-07 NOTE — Progress Notes (Signed)
Referring Provider: Fayrene Helper, MD Primary Care Physician:  Fayrene Helper, MD Primary GI:  Dr. Gala Romney  NOTE: Service was provided via telemedicine and was requested by the patient due to COVID-19 pandemic.  Method of visit: Telephone  Patient Location: Home  Provider Location: Office  Reason for Phone Visit: Follow-up  The patient was consented to phone follow-up via telephone encounter including billing of the encounter (yes/no): Yes  Persons present on the phone encounter, with roles: Wife  Total time (minutes) spent on medical discussion: 15 minutes  Chief Complaint  Patient presents with   Follow-up    Anemia    HPI:   Terry Love is a 83 y.o. male who presents for virtual visit regarding: follow-up on anemia.  He was last seen in our office 05/08/2018 for the same. Colonoscopy up-to-date 2015 with tubular adenoma polyps and recommended 7-year repeat (2022).  No previous EGD.  At the time of his initial referral he was having worsening fatigue and found to be anemic.  Initial work-up found normal folate, iron, ferritin, vitamin D, TSH, kidney function.  Baseline hemoglobin 13.5 which had declined to about 12.6.  At his last visit he had recently had a permanent pacemaker placed which helped his fatigue significantly due to bradycardia.  Denies any overt GI symptoms.  Recommend recheck labs, decide if any further evaluation needed based on labs, follow-up in 3 months.  CBC 05/08/2018 found improvement from 12.2-12.8.  Normal indices.  Ferritin normal.  Recommended follow-up as previously planned  Today he states he's doing ok overall. Feels good currently. Denies abdominal pain, N/V, hematochezia, melena. Denies weakness/fatigue. GERD well managed on Pepcid. Denies URI and flu-like symptoms. Denies chest pain, dyspnea, dizziness, lightheadedness, syncope, near syncope. Denies any other upper or lower GI symptoms.  His wife took his vitals at home and I  will enter these into today's visit.  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 Rehman:Examination performed to cecum Pan colonic diverticulosis/2 small polyps ablated via cold biopsy from transverse colon/External hemorrhoids, benign polyps   COLONOSCOPY N/A 06/29/2013   Procedure: COLONOSCOPY;  Surgeon: Daneil Dolin, MD;  [facility]: [facility];  Service: Endoscopy;  Laterality: N/A;  10:45   EYE SURGERY     left eye cataracts    PACEMAKER IMPLANT N/A 03/24/2018   Procedure: PACEMAKER IMPLANT;  Surgeon: Evans Lance, MD;  Location: [facility];  Service: Cardiovascular;  Laterality: N/A;    Current Outpatient Medications  Medication Sig Dispense Refill   aspirin (BAYER ASPIRIN EC LOW DOSE) 81 MG EC tablet Take 81 mg by mouth at bedtime.      atorvastatin (LIPITOR) 40 MG tablet TAKE 1 TABLET AT BEDTIME 90 tablet 1   azelastine (ASTELIN) 0.1 % nasal spray Place 2 sprays into both nostrils 2 (two) times daily. Use in each nostril as directed (Patient taking differently: Place 2 sprays into both nostrils 2 (two) times daily as needed (allergies.). ) 30 mL 12  Brinzolamide-Brimonidine (SIMBRINZA) 1-0.2 % SUSP Place 1 drop into both eyes 3 (three) times daily.     budesonide-formoterol (SYMBICORT) 160-4.5 MCG/ACT inhaler TAKE 2 PUFFS BY MOUTH TWICE A DAY (Patient taking differently: Inhale 2 puffs into the lungs 2 (two) times daily. TAKE 2 PUFFS BY MOUTH TWICE A DAY) 10.2 Inhaler 2   calcium-vitamin D (OSCAL WITH D) 500-200 MG-UNIT tablet Take 1 tablet by mouth 2 (two) times daily. 150 tablet 1   famotidine  (PEPCID) 20 MG tablet Take 1 tablet (20 mg total) by mouth daily. 10 tablet 0   ibuprofen (ADVIL) 600 MG tablet Take one tablet two times daily for 5 days (Patient taking differently: as needed. Take one tablet two times daily for 5 days) 10 tablet 0   latanoprost (XALATAN) 0.005 % ophthalmic solution Place 1 drop into both eyes at bedtime.     Multiple Vitamin (MULTIVITAMIN WITH MINERALS) TABS tablet Take 1 tablet by mouth at bedtime.     VENTOLIN HFA 108 (90 Base) MCG/ACT inhaler INHALE 2 PUFFS INTO THE LUNGS EVERY 4 (FOUR) HOURS AS NEEDED FOR WHEEZING OR SHORTNESS OF BREATH 18 Inhaler 5   vitamin C (ASCORBIC ACID) 500 MG tablet Take 500 mg by mouth at bedtime.      Vitamin D, Ergocalciferol, (DRISDOL) 1.25 MG (50000 UT) CAPS capsule Take 1 capsule (50,000 Units total) by mouth every Monday. In the morning 90 capsule 3   Wheat Dextrin (BENEFIBER DRINK MIX PO) Take 1 Dose by mouth daily as needed (for regularity). Use as directed daily      No current facility-administered medications for this visit.     Allergies as of 08/07/2018   (No Known Allergies)    Family History  Problem Relation Age of Onset   Colon cancer Mother        diagnosed at age 81   Aneurysm Father    Aneurysm Brother    Stroke Brother     Social History   Socioeconomic History   Marital status: Married    Spouse name: Ceasar Lund    Number of children: 0   Years of education: 12   Highest education level: 12th grade  Occupational History   Occupation: retired  Scientist, product/process development strain: Not very hard   Food insecurity:    Worry: Never true    Inability: Never true   Transportation needs:    Medical: No    Non-medical: No  Tobacco Use   Smoking status: Never Smoker   Smokeless tobacco: Former Systems developer    Types: Chew  Substance and Sexual Activity   Alcohol use: No    Alcohol/week: 0.0 standard drinks   Drug use: No   Sexual activity: Not Currently  Lifestyle    Physical activity:    Days per week: 3 days    Minutes per session: 20 min   Stress: Not at all  Relationships   Social connections:    Talks on phone: Once a week    Gets together: Once a week    Attends religious service: 1 to 4 times per year    Active member of club or organization: Yes    Attends meetings of clubs or organizations: 1 to 4 times per year    Relationship status: Married  Other Topics Concern   Not on file  Social History Narrative   Not on file    Review of Systems: General: Negative for anorexia, weight loss, fever, chills, fatigue, weakness. Eyes: Negative  for vision changes.  ENT: Negative for hoarseness, difficulty swallowing , nasal congestion. CV: Negative for chest pain, angina, palpitations, dyspnea on exertion, peripheral edema.  Respiratory: Negative for dyspnea at rest, dyspnea on exertion, cough, sputum, wheezing.  GI: See history of present illness. GU:  Negative for dysuria, hematuria, urinary incontinence, urinary frequency, nocturnal urination.  MS: Negative for joint pain, low back pain.  Derm: Negative for rash or itching.  Neuro: Negative for weakness, abnormal sensation, seizure, frequent headaches, memory loss, confusion.  Psych: Negative for anxiety, depression, suicidal ideation, hallucinations.  Endo: Negative for unusual weight change.  Heme: Negative for bruising or bleeding. Allergy: Negative for rash or hives.  Physical Exam: @VS @ Note: limited exam due to virtual visit General:   Alert and oriented. Pleasant and cooperative. Well-nourished and well-developed.  Head:  Normocephalic and atraumatic. Eyes:  Without icterus, sclera clear and conjunctiva pink.  Ears:  Normal auditory acuity. Skin:  Intact without facial significant lesions or rashes. Neurologic:  Alert and oriented x4;  grossly normal neurologically. Psych:  Alert and cooperative. Normal mood and affect. Heme/Lymph/Immune: No excessive bruising noted.

## 2018-08-13 ENCOUNTER — Encounter: Payer: Self-pay | Admitting: Family Medicine

## 2018-08-13 ENCOUNTER — Ambulatory Visit (INDEPENDENT_AMBULATORY_CARE_PROVIDER_SITE_OTHER): Payer: Medicare Other | Admitting: Family Medicine

## 2018-08-13 ENCOUNTER — Other Ambulatory Visit: Payer: Self-pay

## 2018-08-13 VITALS — BP 90/62 | HR 67 | Resp 15 | Ht 70.0 in | Wt 211.0 lb

## 2018-08-13 DIAGNOSIS — Z950 Presence of cardiac pacemaker: Secondary | ICD-10-CM | POA: Diagnosis not present

## 2018-08-13 DIAGNOSIS — E785 Hyperlipidemia, unspecified: Secondary | ICD-10-CM

## 2018-08-13 DIAGNOSIS — M542 Cervicalgia: Secondary | ICD-10-CM | POA: Diagnosis not present

## 2018-08-13 DIAGNOSIS — E669 Obesity, unspecified: Secondary | ICD-10-CM

## 2018-08-13 DIAGNOSIS — F418 Other specified anxiety disorders: Secondary | ICD-10-CM

## 2018-08-13 DIAGNOSIS — G8929 Other chronic pain: Secondary | ICD-10-CM | POA: Diagnosis not present

## 2018-08-13 MED ORDER — GABAPENTIN 100 MG PO CAPS: 100.0000 mg | ORAL_CAPSULE | Freq: Three times a day (TID) | ORAL | 3 refills | Status: DC

## 2018-08-13 NOTE — Patient Instructions (Signed)
F/u in September as before, call if you need me sooner.  New for neck pain at night is Gabapentin 100 mg, take ONE at night ONLY for 3 days, if this controls your pain, stay on this dose. If you still have pain at night, increase to TWO capsules at night. Again if this controls your pain, stay on this , if not, then increase to THREE capsules at night only  Although prescription says one three times daily, take the medication ONLY AT NIGHT  Thanks for choosing [facility], we consider it a privelige to serve you.   Social distancing. Frequent hand washing with soap and water Keeping your hands off of your face.Wear a face mask and remain 6 ft away from people who do not live with you These 3 practices will help to keep both you and your community healthy during this time. Please practice them faithfully!

## 2018-08-13 NOTE — Assessment & Plan Note (Addendum)
Start gabapentin 100 mg one at night , may increase to 3 at night

## 2018-08-16 NOTE — Assessment & Plan Note (Signed)
Continue curent medication, not suicidal or homicidal, stressed because of COVID

## 2018-08-16 NOTE — Assessment & Plan Note (Signed)
Deteriorated.  Patient re-educated about  the importance of commitment to a  minimum of 150 minutes of exercise per week as able.  The importance of healthy food choices with portion control discussed, as well as eating regularly and within a 12 hour window most days. The need to choose "clean , green" food 50 to 75% of the time is discussed, as well as to make water the primary drink and set a goal of 64 ounces water daily.  Encouraged to start a food diary,  and to consider  joining a support group. Sample diet sheets offered. Goals set by the patient for the next several months.   Weight /BMI 08/13/2018 08/07/2018 07/30/2018  WEIGHT 211 lb 210 lb 214 lb  HEIGHT 5\' 10"  5\' 10"  5\' 11"   BMI 30.28 kg/m2 30.13 kg/m2 29.85 kg/m2

## 2018-08-16 NOTE — Progress Notes (Signed)
   Terry Love     MRN: [id number]      DOB: 07/02/1935   HPI Terry Love is here for follow up and re-evaluation of chronic medical conditions, medication management and review of any available recent lab and radiology data.  Preventive health is updated, specifically  Cancer screening and Immunization.   Questions or concerns regarding consultations or procedures which the PT has had in the interim are  addressed. The PT denies any adverse reactions to current medications since the last visit.  Still c/o neck pain rated at a 2 , does not keep him awake  ROS Denies recent fever or chills. Denies sinus pressure, nasal congestion, ear pain or sore throat. Denies chest congestion, productive cough or wheezing. Denies chest pains, palpitations and leg swelling Denies abdominal pain, nausea, vomiting,diarrhea or constipation.   Denies dysuria, frequency, hesitancy or incontinence.  Denies headaches, seizures, numbness, or tingling. Denies depression, anxiety or insomnia. Denies skin break down or rash.   PE  BP 90/62   Pulse 67   Resp 15   Ht 5\' 10"  (1.778 m)   Wt 211 lb (95.7 kg)   SpO2 97%   BMI 30.28 kg/m   Terry Love alert and oriented and in no cardiopulmonary distress. Hand tremor  HEENT: No facial asymmetry, EOMI,   oropharynx pink and moist.  Neck decreased ROM no JVD, no mass.  Chest: Clear to auscultation bilaterally.  CVS: S1, S2 no murmurs, no S3.Regular rate.  ABD: Soft non tender.   Ext: No edema  MS: Adequate ROM spine, shoulders, hips and knees.  Skin: Intact, no ulcerations or rash noted.  Psych: Good eye contact, normal affect. Memory loss not anxious or depressed appearing.  CNS: CN 2-12 intact, power,  normal throughout.no focal deficits noted.   Assessment & Plan  Neck pain, chronic Start gabapentin 100 mg one at night , may increase to 3 at night  Obesity (BMI 30.0-34.9) Deteriorated.  Terry Love re-educated about  the importance of  commitment to a  minimum of 150 minutes of exercise per week as able.  The importance of healthy food choices with portion control discussed, as well as eating regularly and within a 12 hour window most days. The need to choose "clean , green" food 50 to 75% of the time is discussed, as well as to make water the primary drink and set a goal of 64 ounces water daily.  Encouraged to start a food diary,  and to consider  joining a support group. Sample diet sheets offered. Goals set by the Terry Love for the next several months.   Weight /BMI 08/13/2018 08/07/2018 07/30/2018  WEIGHT 211 lb 210 lb 214 lb  HEIGHT 5\' 10"  5\' 10"  5\' 11"   BMI 30.28 kg/m2 30.13 kg/m2 29.85 kg/m2      Hyperlipidemia LDL goal <100 Hyperlipidemia:Low fat diet discussed and encouraged.   Lipid Panel  Lab Results  Component Value Date   CHOL 121 07/30/2018   HDL 52 07/30/2018   LDLCALC 54 07/30/2018   TRIG 75 07/30/2018   CHOLHDL 2.3 07/30/2018     controlled  Depression with anxiety Continue curent medication, not suicidal or homicidal, stressed because of COVID  Pacemaker Followed by cardiology,  Functioning well, pt with improved sens of wellbeing now his HR is controlled

## 2018-08-16 NOTE — Assessment & Plan Note (Signed)
Hyperlipidemia:Low fat diet discussed and encouraged.   Lipid Panel  Lab Results  Component Value Date   CHOL 121 07/30/2018   HDL 52 07/30/2018   LDLCALC 54 07/30/2018   TRIG 75 07/30/2018   CHOLHDL 2.3 07/30/2018     controlled

## 2018-08-16 NOTE — Assessment & Plan Note (Signed)
Followed by cardiology,  Functioning well, pt with improved sens of wellbeing now his HR is controlled

## 2018-09-08 DIAGNOSIS — R0989 Other specified symptoms and signs involving the circulatory and respiratory systems: Secondary | ICD-10-CM | POA: Diagnosis not present

## 2018-09-08 DIAGNOSIS — E785 Hyperlipidemia, unspecified: Secondary | ICD-10-CM | POA: Diagnosis not present

## 2018-09-08 DIAGNOSIS — Z125 Encounter for screening for malignant neoplasm of prostate: Secondary | ICD-10-CM | POA: Diagnosis not present

## 2018-09-08 DIAGNOSIS — E559 Vitamin D deficiency, unspecified: Secondary | ICD-10-CM | POA: Diagnosis not present

## 2018-09-09 ENCOUNTER — Encounter: Payer: Self-pay | Admitting: Family Medicine

## 2018-09-09 LAB — COMPLETE METABOLIC PANEL WITH GFR
AG Ratio: 1.4 (calc) (ref 1.0–2.5)
ALT: 15 U/L (ref 9–46)
AST: 24 U/L (ref 10–35)
Albumin: 3.8 g/dL (ref 3.6–5.1)
Alkaline phosphatase (APISO): 45 U/L (ref 35–144)
BUN: 15 mg/dL (ref 7–25)
CO2: 27 mmol/L (ref 20–32)
Calcium: 9.1 mg/dL (ref 8.6–10.3)
Chloride: 109 mmol/L (ref 98–110)
Creat: 0.83 mg/dL (ref 0.70–1.11)
GFR, Est African American: 96 mL/min/{1.73_m2} (ref >=60)
GFR, Est Non African American: 83 mL/min/{1.73_m2} (ref >=60)
Globulin: 2.8 g/dL (calc) (ref 1.9–3.7)
Glucose, Bld: 106 mg/dL — ABNORMAL HIGH (ref 65–99)
Potassium: 4.1 mmol/L (ref 3.5–5.3)
Sodium: 142 mmol/L (ref 135–146)
Total Bilirubin: 0.5 mg/dL (ref 0.2–1.2)
Total Protein: 6.6 g/dL (ref 6.1–8.1)

## 2018-09-09 LAB — LIPID PANEL
Cholesterol: 131 mg/dL (ref <200)
HDL: 49 mg/dL (ref >=40)
LDL Cholesterol (Calc): 69 mg/dL (calc)
Non-HDL Cholesterol (Calc): 82 mg/dL (calc) (ref <130)
Total CHOL/HDL Ratio: 2.7 (calc) (ref <5.0)
Triglycerides: 51 mg/dL (ref <150)

## 2018-09-09 LAB — VITAMIN D 25 HYDROXY (VIT D DEFICIENCY, FRACTURES): Vit D, 25-Hydroxy: 50 ng/mL (ref 30–100)

## 2018-09-09 LAB — PSA: PSA: 0.2 ng/mL (ref <=4.0)

## 2018-09-13 ENCOUNTER — Other Ambulatory Visit: Payer: Self-pay | Admitting: Pulmonary Disease

## 2018-11-06 ENCOUNTER — Other Ambulatory Visit: Payer: Self-pay

## 2018-11-06 ENCOUNTER — Encounter: Payer: Self-pay | Admitting: Nurse Practitioner

## 2018-11-06 ENCOUNTER — Encounter: Payer: Self-pay | Admitting: Internal Medicine

## 2018-11-06 ENCOUNTER — Ambulatory Visit (INDEPENDENT_AMBULATORY_CARE_PROVIDER_SITE_OTHER): Payer: Medicare Other | Admitting: Nurse Practitioner

## 2018-11-06 VITALS — BP 114/70 | HR 68 | Temp 97.1°F | Ht 70.0 in | Wt 208.8 lb

## 2018-11-06 DIAGNOSIS — K5900 Constipation, unspecified: Secondary | ICD-10-CM | POA: Diagnosis not present

## 2018-11-06 DIAGNOSIS — D649 Anemia, unspecified: Secondary | ICD-10-CM

## 2018-11-06 NOTE — Progress Notes (Signed)
cc'ed to pcp °

## 2018-11-06 NOTE — Assessment & Plan Note (Signed)
Previous constipation when on iron.  He has since stopped taking iron.  He is also taking Benefiber daily.  Subsequently his constipation is resolved.  Recommend continue to monitor symptoms and follow-up in 6 months.

## 2018-11-06 NOTE — Progress Notes (Signed)
Referring Provider: Fayrene Helper, MD Primary Care Physician:  Fayrene Helper, MD Primary GI:  Dr. Gala Romney  Chief Complaint  Patient presents with  . Anemia    f/u    HPI:   Terry Love is a 83 y.o. male who presents for follow-up on anemia.  The patient was last seen in our office 08/07/2018 for the same.  His last visit was a virtual office visit due to coronavirus/COVID-19 pandemic.  Colonoscopy up-to-date 2015 and due to polyps recommended repeat in 2022.  No previous EGD.  On his initial referral he was having worsening fatigue and found to be anemic.  His baseline hemoglobin is generally 13.5 and it has declined to 12.6.  Recent permanent pacemaker placement which helped to stick fatigue significantly due to bradycardia.  CBC in January 2020 saw improvement in his hemoglobin from 12.2-12.8.  Normal indices and ferritin.  At his last visit he was doing well overall, feels good.  No overt GI symptoms.  Recommend he notify us of any recurrent symptomatic anemia symptoms for obvious GI bleed.  Follow-up in 3 months to recheck labs.  Today he states he's doing well overall. Is worried about his COVID-19 risks. Wears a mask when he has to go out and only goes out if necessary. Energy is good. Denies abdominal pain, N/V, hematochezia, melena, fever, chills, unintentional weight loss. He has lost some weight due to cutting out sweets and carbs to try and prevent diabetes. Denies constipation, on Benefiber. Denies URI or flu-like symptoms. Denies loss of sense of taste or smell. Denies chest pain, dyspnea, dizziness, lightheadedness, syncope, near syncope. Denies any other upper or lower GI symptoms.  Past Medical History:  Diagnosis Date  . Anxiety   . Bradycardia    Chronic   . Colon polyps   . Depression   . Facial tic   . Hearing loss   . Hyperlipidemia   . 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 Obesity   . Seasonal allergies     Past Surgical  History:  Procedure Laterality Date  . carpal tunnel  release right  2006  . COLONOSCOPY    . COLONOSCOPY  01/25/2011   Dr. Rehman:Examination performed to cecum Pan colonic diverticulosis/2 small polyps ablated via cold biopsy from transverse colon/External hemorrhoids, benign polyps  . COLONOSCOPY N/A 06/29/2013   Procedure: COLONOSCOPY;  Surgeon: Daneil Dolin, MD;  [facility]: [facility];  Service: Endoscopy;  Laterality: N/A;  10:45  . EYE SURGERY     left eye cataracts   . PACEMAKER IMPLANT N/A 03/24/2018   Procedure: PACEMAKER IMPLANT;  Surgeon: Evans Lance, MD;  Location: [facility];  Service: Cardiovascular;  Laterality: N/A;    Current Outpatient Medications  Medication Sig Dispense Refill  . aspirin (BAYER ASPIRIN EC LOW DOSE) 81 MG EC tablet Take 81 mg by mouth at bedtime.     Marland Kitchen atorvastatin (LIPITOR) 40 MG tablet TAKE 1 TABLET AT BEDTIME 90 tablet 1  . azelastine (ASTELIN) 0.1 % nasal spray Place 2 sprays into both nostrils 2 (two) times daily. Use in each nostril as directed (Patient taking differently: Place 2 sprays into both nostrils 2 (two) times daily as needed (allergies.). ) 30 mL 12  . Brinzolamide-Brimonidine (SIMBRINZA) 1-0.2 % SUSP Place 1 drop into both eyes 3 (three) times daily.    . budesonide-formoterol (SYMBICORT) 160-4.5 MCG/ACT inhaler Inhale 2 puffs into the lungs 2 (two) times daily. TAKE  2 PUFFS BY MOUTH TWICE A DAY 1 Inhaler 5  . calcium-vitamin D (OSCAL WITH D) 500-200 MG-UNIT tablet Take 1 tablet by mouth 2 (two) times daily. 150 tablet 1  . famotidine (PEPCID) 20 MG tablet Take 1 tablet (20 mg total) by mouth daily. 10 tablet 0  . gabapentin (NEURONTIN) 100 MG capsule Take 1 capsule (100 mg total) by mouth 3 (three) times daily. 90 capsule 3  . ibuprofen (ADVIL) 600 MG tablet Take one tablet two times daily for 5 days (Patient taking differently: as needed. Take one tablet two times daily for 5 days) 10 tablet 0  . latanoprost (XALATAN)  0.005 % ophthalmic solution Place 1 drop into both eyes at bedtime.    . Multiple Vitamin (MULTIVITAMIN WITH MINERALS) TABS tablet Take 1 tablet by mouth at bedtime.    . VENTOLIN HFA 108 (90 Base) MCG/ACT inhaler INHALE 2 PUFFS INTO THE LUNGS EVERY 4 (FOUR) HOURS AS NEEDED FOR WHEEZING OR SHORTNESS OF BREATH 18 Inhaler 5  . vitamin C (ASCORBIC ACID) 500 MG tablet Take 500 mg by mouth at bedtime.     . Vitamin D, Ergocalciferol, (DRISDOL) 1.25 MG (50000 UT) CAPS capsule Take 1 capsule (50,000 Units total) by mouth every Monday. In the morning 90 capsule 3  . Wheat Dextrin (BENEFIBER DRINK MIX PO) Take 1 Dose by mouth daily as needed (for regularity). Use as directed daily      No current facility-administered medications for this visit.     Allergies as of 11/06/2018  . (No Known Allergies)    Family History  Problem Relation Age of Onset  . Colon cancer Mother        diagnosed at age 2  . Aneurysm Father   . Aneurysm Brother   . Stroke Brother     Social History   Socioeconomic History  . Marital status: Married    Spouse name: Terry Love   . Number of children: 0  . Years of education: 66  . Highest education level: 12th grade  Occupational History  . Occupation: retired  Scientific laboratory technician  . Financial resource strain: Not very hard  . Food insecurity    Worry: Never true    Inability: Never true  . Transportation needs    Medical: No    Non-medical: No  Tobacco Use  . Smoking status: Never Smoker  . Smokeless tobacco: Former Systems developer    Types: Chew  Substance and Sexual Activity  . Alcohol use: No    Alcohol/week: 0.0 standard drinks  . Drug use: No  . Sexual activity: Not Currently  Lifestyle  . Physical activity    Days per week: 3 days    Minutes per session: 20 min  . Stress: Not at all  Relationships  . Social Herbalist on phone: Once a week    Gets together: Once a week    Attends religious service: 1 to 4 times per year    Active member of club or  organization: Yes    Attends meetings of clubs or organizations: 1 to 4 times per year    Relationship status: Married  Other Topics Concern  . Not on file  Social History Narrative  . Not on file    Review of Systems: General: Negative for anorexia, weight loss, fever, chills, fatigue, weakness. ENT: Negative for hoarseness, difficulty swallowing , nasal congestion. CV: Negative for chest pain, angina, palpitations, dyspnea on exertion, peripheral edema.  Respiratory: Negative for dyspnea  at rest, dyspnea on exertion, cough, sputum, wheezing.  GI: See history of present illness. Endo: Negative for unusual weight change.  Heme: Negative for bruising or bleeding. Allergy: Negative for rash or hives.   Physical Exam: BP 114/70   Pulse 68   Temp (!) 97.1 F (36.2 C) (Oral)   Ht 5\' 10"  (1.778 m)   Wt 208 lb 12.8 oz (94.7 kg)   BMI 29.96 kg/m  General:   Alert and oriented. Pleasant and cooperative. Well-nourished and well-developed.  Eyes:  Without icterus, sclera clear and conjunctiva pink.  Ears:  Normal auditory acuity. Cardiovascular:  S1, S2 present without murmurs appreciated. Extremities without clubbing or edema. Respiratory:  Clear to auscultation bilaterally. No wheezes, rales, or rhonchi. No distress.  Gastrointestinal:  +BS, soft, non-tender and non-distended. No HSM noted. No guarding or rebound. No masses appreciated.  Rectal:  Deferred  Musculoskalatal:  Symmetrical without gross deformities. Neurologic:  Alert and oriented x4;  grossly normal neurologically. Psych:  Alert and cooperative. Normal mood and affect. Heme/Lymph/Immune: No excessive bruising noted.    11/06/2018 9:23 AM   Disclaimer: This note was dictated with voice recognition software. Similar sounding words can inadvertently be transcribed and may not be corrected upon review.

## 2018-11-06 NOTE — Patient Instructions (Signed)
Your health issues we discussed today were:   Anemia: 1. Your anemia is likely for several reasons including your chronic kidney disease, mild blood loss, and vitamin B12 deficiency 2. I will recheck your labs today 3. We will call you with results 4. If your labs remain slightly low we may need to send you to a hematologist (blood doctor) to help with your anemia 5. Call us if you notice any obvious bleeding  Overall I recommend:  1. Continue your current medications 2. Follow-up in 6 months 3. Call us if you have any questions or concerns.   Because of recent events of COVID-19 ("Coronavirus"), follow CDC recommendations:  1. Wash your hand frequently 2. Avoid touching your face 3. Stay away from people who are sick 4. If you have symptoms such as fever, cough, shortness of breath then call your healthcare provider for further guidance 5. If you are sick, STAY AT HOME unless otherwise directed by your healthcare provider. 6. Follow directions from state and national officials regarding staying safe   At [facility] we value your feedback. You may receive a survey about your visit today. Please share your experience as we strive to create trusting relationships with our patients to provide genuine, compassionate, quality care.  We appreciate your understanding and patience as we review any laboratory studies, imaging, and other diagnostic tests that are ordered as we care for you. Our office policy is 5 business days for review of these results, and any emergent or urgent results are addressed in a timely manner for your best interest. If you do not hear from our office in 1 week, please contact us.   We also encourage the use of MyChart, which contains your medical information for your review as well. If you are not enrolled in this feature, an access code is on this after visit summary for your convenience. Thank you for allowing [place] to be involved in your care.  It  was great to see you today!  I hope you have a great summer!!

## 2018-11-06 NOTE — Assessment & Plan Note (Signed)
Chronic anemia likely multifactorial in nature including kidney disease, mild blood loss/iron deficiency, B12 deficiency.  Appears he is on supplementation for his B12.  He was on iron previously but stopped taking this due to adverse effect of constipation.  He has no longer taking iron apparently.  He no longer has constipation, taking Benefiber daily.  At this point I will recheck his labs including CBC, iron, ferritin.  If this iron studies remain low we may need to refer him to hematology for further evaluation.  Follow-up in 6 months.

## 2018-11-07 LAB — CBC WITH DIFFERENTIAL/PLATELET
Absolute Monocytes: 530 cells/uL (ref 200–950)
Basophils Absolute: 63 cells/uL (ref 0–200)
Basophils Relative: 1.1 %
Eosinophils Absolute: 211 cells/uL (ref 15–500)
Eosinophils Relative: 3.7 %
HCT: 39 % (ref 38.5–50.0)
Hemoglobin: 12.8 g/dL — ABNORMAL LOW (ref 13.2–17.1)
Lymphs Abs: 1368 cells/uL (ref 850–3900)
MCH: 30.2 pg (ref 27.0–33.0)
MCHC: 32.8 g/dL (ref 32.0–36.0)
MCV: 92 fL (ref 80.0–100.0)
MPV: 10 fL (ref 7.5–12.5)
Monocytes Relative: 9.3 %
Neutro Abs: 3528 cells/uL (ref 1500–7800)
Neutrophils Relative %: 61.9 %
Platelets: 306 10*3/uL (ref 140–400)
RBC: 4.24 10*6/uL (ref 4.20–5.80)
RDW: 11.4 % (ref 11.0–15.0)
Total Lymphocyte: 24 %
WBC: 5.7 10*3/uL (ref 3.8–10.8)

## 2018-11-07 LAB — IRON: Iron: 99 ug/dL (ref 50–180)

## 2018-11-07 LAB — FERRITIN: Ferritin: 138 ng/mL (ref 24–380)

## 2018-11-27 ENCOUNTER — Ambulatory Visit (INDEPENDENT_AMBULATORY_CARE_PROVIDER_SITE_OTHER): Payer: Medicare Other

## 2018-11-27 ENCOUNTER — Other Ambulatory Visit: Payer: Self-pay

## 2018-11-27 DIAGNOSIS — Z23 Encounter for immunization: Secondary | ICD-10-CM | POA: Diagnosis not present

## 2018-12-16 ENCOUNTER — Other Ambulatory Visit: Payer: Self-pay | Admitting: Family Medicine

## 2018-12-23 ENCOUNTER — Other Ambulatory Visit: Payer: Self-pay | Admitting: Family Medicine

## 2018-12-30 ENCOUNTER — Ambulatory Visit (INDEPENDENT_AMBULATORY_CARE_PROVIDER_SITE_OTHER): Payer: Medicare Other | Admitting: Family Medicine

## 2018-12-30 ENCOUNTER — Other Ambulatory Visit: Payer: Self-pay

## 2018-12-30 VITALS — BP 144/72 | HR 65 | Temp 98.6°F | Ht 70.0 in | Wt 208.0 lb

## 2018-12-30 DIAGNOSIS — E669 Obesity, unspecified: Secondary | ICD-10-CM

## 2018-12-30 DIAGNOSIS — F322 Major depressive disorder, single episode, severe without psychotic features: Secondary | ICD-10-CM | POA: Diagnosis not present

## 2018-12-30 DIAGNOSIS — R0989 Other specified symptoms and signs involving the circulatory and respiratory systems: Secondary | ICD-10-CM | POA: Diagnosis not present

## 2018-12-30 DIAGNOSIS — E785 Hyperlipidemia, unspecified: Secondary | ICD-10-CM

## 2018-12-30 DIAGNOSIS — H9193 Unspecified hearing loss, bilateral: Secondary | ICD-10-CM

## 2018-12-30 NOTE — Patient Instructions (Signed)
F/u in office with MD in January call if you need me sooner.  Please call your mental health provider this morning and arrange for sooner appointment to have your depression treated.  You will benefit both from therapy and medication adjustment.  Please note that you are not alone and we are here to help in what ever way we can and that your mental health can and needs to improve.  It is important that you exercise regularly at least 30 minutes 5 times a week. If you develop chest pain, have severe difficulty breathing, or feel very tired, stop exercising immediately and seek medical attention  Think about what you will eat, plan ahead. Choose " clean, green, fresh or frozen" over canned, processed or packaged foods which are more sugary, salty and fatty. 70 to 75% of food eaten should be vegetables and fruit. Three meals at set times with snacks allowed between meals, but they must be fruit or vegetables. Aim to eat over a 12 hour period , example 7 am to 7 pm, and STOP after  your last meal of the day. Drink water,generally about 64 ounces per day, no other drink is as healthy. Fruit juice is best enjoyed in a healthy way, by EATING the fruit.   Thanks for choosing [facility], we consider it a privelige to serve you.

## 2018-12-30 NOTE — Progress Notes (Signed)
Virtual Visit via Telephone Note  I connected with Terry Love on 12/30/18 at  8:00 AM EDT by telephone and verified that I am speaking with the correct person using two identifiers.  Location: Patient: home Provider: office   I discussed the limitations, risks, security and privacy concerns of performing an evaluation and management service by telephone and the availability of in person appointments. I also discussed with the patient that there may be a patient responsible charge related to this service. The patient expressed understanding and agreed to proceed.   History of Present Illness: F/u chronicproblems  I am very depressed, I do not want to kill myself or anyone, but I am depressed and want more help than I have now. Since covid I have more depression and anxiety Denies recent fever or chills. Denies sinus pressure, nasal congestion, ear pain or sore throat. Denies chest congestion, productive cough or wheezing. Denies chest pains, palpitations and leg swelling Denies abdominal pain, nausea, vomiting,diarrhea or constipation.   Denies dysuria, frequency, hesitancy or incontinence. Denies uncontrolled  joint pain, swelling and limitation in mobility. Denies headaches, seizures, numbness, or tingling.  Denies skin break down or rash.     Observations/Objective: BP (!) 144/72   Pulse 65   Temp 98.6 F (37 C) (Oral)   Ht 5\' 10"  (1.778 m)   Wt 208 lb (94.3 kg)   BMI 29.84 kg/m  Good communication with no confusion and intact memory. Alert and oriented x 3 No signs of respiratory distress during speech    Assessment and Plan: Hyperlipidemia LDL goal <100 Hyperlipidemia:Low fat diet discussed and encouraged.   Lipid Panel  Lab Results  Component Value Date   CHOL 131 09/08/2018   HDL 49 09/08/2018   LDLCALC 69 09/08/2018   TRIG 51 09/08/2018   CHOLHDL 2.7 09/08/2018   Controlled, no change in medication     Depression, major, single episode, severe  (HCC) Uncontrolled. Advised pt and his wife who was a part of the visit, to directlyu contact the Psych who treats him and saw him as recently as 1 week ago, to arrange visit for re eval and medication adjustment . Pt called back in the next 2 days tht this had been done  Labile blood pressure Reported high during home check, will re eval in office DASH diet and commitment to daily physical activity for a minimum of 30 minutes discussed and encouraged, as a part of hypertension management. The importance of attaining a healthy weight is also discussed.  BP/Weight 12/30/2018 11/06/2018 08/13/2018 08/07/2018 07/30/2018 06/03/2018 [id number]  Systolic BP 123456 [id number]  Diastolic BP 72 70 62 66 68 64 66  Wt. (Lbs) 208 208.8 211 210 214 216 216.8  BMI 29.84 29.96 30.28 30.13 29.85 30.13 31.11       Hearing loss unchanged , relies on hearing aids  Obesity (BMI 30.0-34.9)  Patient re-educated about  the importance of commitment to a  minimum of 150 minutes of exercise per week as able.  The importance of healthy food choices with portion control discussed, as well as eating regularly and within a 12 hour window most days. The need to choose "clean , green" food 50 to 75% of the time is discussed, as well as to make water the primary drink and set a goal of 64 ounces water daily.    Weight /BMI 12/30/2018 11/06/2018 08/13/2018  WEIGHT 208 lb 208 lb 12.8 oz 211 lb  HEIGHT 5\' 10"   5\' 10"  5\' 10"   BMI 29.84 kg/m2 29.96 kg/m2 30.28 kg/m2        Follow Up Instructions:    I discussed the assessment and treatment plan with the patient. The patient was provided an opportunity to ask questions and all were answered. The patient agreed with the plan and demonstrated an understanding of the instructions.   The patient was advised to call back or seek an in-person evaluation if the symptoms worsen or if the condition fails to improve as anticipated.  I provided 22 minutes of  non-face-to-face time during this encounter.   Tula Nakayama, MD

## 2019-01-01 ENCOUNTER — Telehealth: Payer: Self-pay | Admitting: Family Medicine

## 2019-01-01 ENCOUNTER — Other Ambulatory Visit: Payer: Self-pay | Admitting: Family Medicine

## 2019-01-01 NOTE — Telephone Encounter (Signed)
They have some information to relay to Dr Moshe Cipro or a nurse.

## 2019-01-01 NOTE — Telephone Encounter (Signed)
Noted good that they called in

## 2019-01-01 NOTE — Telephone Encounter (Signed)
Jan at the [place] wants him to increase his Effexor 75mg  to BID once in the morning and once mid afternoon. And to find something in the mid afternoon that he enjoys doing to try and calm down. Use a daylight like a fluorescent light to shine on him about 20 mins in the morning and a few mins in the early afternoon, it is supposed to help calm him.

## 2019-01-02 ENCOUNTER — Telehealth: Payer: Self-pay | Admitting: Emergency Medicine

## 2019-01-02 NOTE — Telephone Encounter (Signed)
Received alert that there has been no communication with remote monitor for 21 days. [employer] 3 provided and will call office after transmission sent or with info on monitor issue.

## 2019-01-03 ENCOUNTER — Encounter: Payer: Self-pay | Admitting: Family Medicine

## 2019-01-03 NOTE — Assessment & Plan Note (Signed)
Reported high during home check, will re eval in office DASH diet and commitment to daily physical activity for a minimum of 30 minutes discussed and encouraged, as a part of hypertension management. The importance of attaining a healthy weight is also discussed.  BP/Weight 12/30/2018 11/06/2018 08/13/2018 08/07/2018 07/30/2018 06/03/2018 [id number]  Systolic BP 123456 [id number]  Diastolic BP 72 70 62 66 68 64 66  Wt. (Lbs) 208 208.8 211 210 214 216 216.8  BMI 29.84 29.96 30.28 30.13 29.85 30.13 31.11

## 2019-01-03 NOTE — Assessment & Plan Note (Signed)
Uncontrolled. Advised pt and his wife who was a part of the visit, to directlyu contact the Psych who treats him and saw him as recently as 1 week ago, to arrange visit for re eval and medication adjustment . Pt called back in the next 2 days tht this had been done

## 2019-01-03 NOTE — Assessment & Plan Note (Signed)
Hyperlipidemia:Low fat diet discussed and encouraged.   Lipid Panel  Lab Results  Component Value Date   CHOL 131 09/08/2018   HDL 49 09/08/2018   LDLCALC 69 09/08/2018   TRIG 51 09/08/2018   CHOLHDL 2.7 09/08/2018   Controlled, no change in medication

## 2019-01-03 NOTE — Assessment & Plan Note (Signed)
  Patient re-educated about  the importance of commitment to a  minimum of 150 minutes of exercise per week as able.  The importance of healthy food choices with portion control discussed, as well as eating regularly and within a 12 hour window most days. The need to choose "clean , green" food 50 to 75% of the time is discussed, as well as to make water the primary drink and set a goal of 64 ounces water daily.    Weight /BMI 12/30/2018 11/06/2018 08/13/2018  WEIGHT 208 lb 208 lb 12.8 oz 211 lb  HEIGHT 5\' 10"  5\' 10"  5\' 10"   BMI 29.84 kg/m2 29.96 kg/m2 30.28 kg/m2

## 2019-01-03 NOTE — Assessment & Plan Note (Signed)
unchanged , relies on hearing aids

## 2019-01-05 ENCOUNTER — Ambulatory Visit (INDEPENDENT_AMBULATORY_CARE_PROVIDER_SITE_OTHER): Payer: Medicare Other | Admitting: *Deleted

## 2019-01-05 DIAGNOSIS — I495 Sick sinus syndrome: Secondary | ICD-10-CM

## 2019-01-05 NOTE — Telephone Encounter (Signed)
Transmission received 01-05-2019

## 2019-01-05 NOTE — Telephone Encounter (Signed)
Transmission added to schedule for processing. 

## 2019-01-06 LAB — CUP PACEART REMOTE DEVICE CHECK
Date Time Interrogation Session: 20200929193831
Implantable Lead Implant Date: 20191216
Implantable Lead Implant Date: 20191216
Implantable Lead Location: 753859
Implantable Lead Location: 753860
Implantable Lead Model: 377
Implantable Lead Model: 377
Implantable Lead Serial Number: 80840322
Implantable Lead Serial Number: 80924555
Implantable Pulse Generator Implant Date: 20191216
Pulse Gen Model: 407145
Pulse Gen Serial Number: 69486427

## 2019-01-13 NOTE — Progress Notes (Signed)
Remote pacemaker transmission.   

## 2019-01-29 ENCOUNTER — Ambulatory Visit: Payer: Medicare Other

## 2019-01-30 ENCOUNTER — Encounter: Payer: Self-pay | Admitting: Family Medicine

## 2019-01-30 ENCOUNTER — Ambulatory Visit: Payer: Medicare Other

## 2019-01-30 ENCOUNTER — Other Ambulatory Visit: Payer: Self-pay

## 2019-01-30 ENCOUNTER — Ambulatory Visit (INDEPENDENT_AMBULATORY_CARE_PROVIDER_SITE_OTHER): Payer: Medicare Other | Admitting: Family Medicine

## 2019-01-30 VITALS — BP 144/72 | HR 65 | Resp 15 | Ht 70.0 in | Wt 208.0 lb

## 2019-01-30 DIAGNOSIS — Z0000 Encounter for general adult medical examination without abnormal findings: Secondary | ICD-10-CM

## 2019-01-30 NOTE — Patient Instructions (Addendum)
[name] , Thank you for taking time to come for your Medicare Wellness Visit. I appreciate your ongoing commitment to your health goals. Please review the following plan we discussed and let me know if I can assist you in the future.   Please continue to practice social distancing to keep you, your family, and our community safe.  If you must go out, please wear a Mask and practice good handwashing.  We hope that you stay safe and having a wonderful holiday season see you in the new year at your next appointment.  Please do not hesitate to reach out if you need [place].  Screening recommendations/referrals: Colonoscopy: n/a Recommended yearly ophthalmology/optometry visit for glaucoma screening and checkup Recommended yearly dental visit for hygiene and checkup  Vaccinations: Influenza vaccine: Up-to-date, next 1 due follow 2021 Pneumococcal vaccine: Completed Tdap vaccine: Up-to-date Shingles vaccine:   Completed  Advanced directives: completed  Conditions/risks identified: Falls  Next appointment: 04/29/2019   Preventive Care 43 Years and Older, Male Preventive care refers to lifestyle choices and visits with your health care provider that can promote health and wellness. What does preventive care include?  A yearly physical exam. This is also called an annual well check.  Dental exams once or twice a year.  Routine eye exams. Ask your health care provider how often you should have your eyes checked.  Personal lifestyle choices, including:  Daily care of your teeth and gums.  Regular physical activity.  Eating a healthy diet.  Avoiding tobacco and drug use.  Limiting alcohol use.  Practicing safe sex.  Taking low doses of aspirin every day.  Taking vitamin and mineral supplements as recommended by your health care provider. What happens during an annual well check? The services and screenings done by your health care provider during your annual well check will  depend on your age, overall health, lifestyle risk factors, and family history of disease. Counseling  Your health care provider may ask you questions about your:  Alcohol use.  Tobacco use.  Drug use.  Emotional well-being.  Home and relationship well-being.  Sexual activity.  Eating habits.  History of falls.  Memory and ability to understand (cognition).  Work and work Statistician. Screening  You may have the following tests or measurements:  Height, weight, and BMI.  Blood pressure.  Lipid and cholesterol levels. These may be checked every 5 years, or more frequently if you are over 15 years old.  Skin check.  Lung cancer screening. You may have this screening every year starting at age 68 if you have a 30-pack-year history of smoking and currently smoke or have quit within the past 15 years.  Fecal occult blood test (FOBT) of the stool. You may have this test every year starting at age 28.  Flexible sigmoidoscopy or colonoscopy. You may have a sigmoidoscopy every 5 years or a colonoscopy every 10 years starting at age 47.  Prostate cancer screening. Recommendations will vary depending on your family history and other risks.  Hepatitis C blood test.  Hepatitis B blood test.  Sexually transmitted disease (STD) testing.  Diabetes screening. This is done by checking your blood sugar (glucose) after you have not eaten for a while (fasting). You may have this done every 1-3 years.  Abdominal aortic aneurysm (AAA) screening. You may need this if you are a current or former smoker.  Osteoporosis. You may be screened starting at age 9 if you are at high risk. Talk with your health care  provider about your test results, treatment options, and if necessary, the need for more tests. Vaccines  Your health care provider may recommend certain vaccines, such as:  Influenza vaccine. This is recommended every year.  Tetanus, diphtheria, and acellular pertussis (Tdap,  Td) vaccine. You may need a Td booster every 10 years.  Zoster vaccine. You may need this after age 82.  Pneumococcal 13-valent conjugate (PCV13) vaccine. One dose is recommended after age 43.  Pneumococcal polysaccharide (PPSV23) vaccine. One dose is recommended after age 84. Talk to your health care provider about which screenings and vaccines you need and how often you need them. This information is not intended to replace advice given to you by your health care provider. Make sure you discuss any questions you have with your health care provider. Document Released: 04/22/2015 Document Revised: 12/14/2015 Document Reviewed: 01/25/2015 Elsevier Interactive Patient Education  2017 [place] Prevention in the Home Falls can cause injuries. They can happen to people of all ages. There are many things you can do to make your home safe and to help prevent falls. What can I do on the outside of my home?  Regularly fix the edges of walkways and driveways and fix any cracks.  Remove anything that might make you trip as you walk through a door, such as a raised step or threshold.  Trim any bushes or trees on the path to your home.  Use bright outdoor lighting.  Clear any walking paths of anything that might make someone trip, such as rocks or tools.  Regularly check to see if handrails are loose or broken. Make sure that both sides of any steps have handrails.  Any raised decks and porches should have guardrails on the edges.  Have any leaves, snow, or ice cleared regularly.  Use sand or salt on walking paths during winter.  Clean up any spills in your garage right away. This includes oil or grease spills. What can I do in the bathroom?  Use night lights.  Install grab bars by the toilet and in the tub and shower. Do not use towel bars as grab bars.  Use non-skid mats or decals in the tub or shower.  If you need to sit down in the shower, use a plastic, non-slip stool.   Keep the floor dry. Clean up any water that spills on the floor as soon as it happens.  Remove soap buildup in the tub or shower regularly.  Attach bath mats securely with double-sided non-slip rug tape.  Do not have throw rugs and other things on the floor that can make you trip. What can I do in the bedroom?  Use night lights.  Make sure that you have a light by your bed that is easy to reach.  Do not use any sheets or blankets that are too big for your bed. They should not hang down onto the floor.  Have a firm chair that has side arms. You can use this for support while you get dressed.  Do not have throw rugs and other things on the floor that can make you trip. What can I do in the kitchen?  Clean up any spills right away.  Avoid walking on wet floors.  Keep items that you use a lot in easy-to-reach places.  If you need to reach something above you, use a strong step stool that has a grab bar.  Keep electrical cords out of the way.  Do not use floor polish  or wax that makes floors slippery. If you must use wax, use non-skid floor wax.  Do not have throw rugs and other things on the floor that can make you trip. What can I do with my stairs?  Do not leave any items on the stairs.  Make sure that there are handrails on both sides of the stairs and use them. Fix handrails that are broken or loose. Make sure that handrails are as long as the stairways.  Check any carpeting to make sure that it is firmly attached to the stairs. Fix any carpet that is loose or worn.  Avoid having throw rugs at the top or bottom of the stairs. If you do have throw rugs, attach them to the floor with carpet tape.  Make sure that you have a light switch at the top of the stairs and the bottom of the stairs. If you do not have them, ask someone to add them for you. What else can I do to help prevent falls?  Wear shoes that:  Do not have high heels.  Have rubber bottoms.  Are  comfortable and fit you well.  Are closed at the toe. Do not wear sandals.  If you use a stepladder:  Make sure that it is fully opened. Do not climb a closed stepladder.  Make sure that both sides of the stepladder are locked into place.  Ask someone to hold it for you, if possible.  Clearly mark and make sure that you can see:  Any grab bars or handrails.  First and last steps.  Where the edge of each step is.  Use tools that help you move around (mobility aids) if they are needed. These include:  Canes.  Walkers.  Scooters.  Crutches.  Turn on the lights when you go into a dark area. Replace any light bulbs as soon as they burn out.  Set up your furniture so you have a clear path. Avoid moving your furniture around.  If any of your floors are uneven, fix them.  If there are any pets around you, be aware of where they are.  Review your medicines with your doctor. Some medicines can make you feel dizzy. This can increase your chance of falling. Ask your doctor what other things that you can do to help prevent falls. This information is not intended to replace advice given to you by your health care provider. Make sure you discuss any questions you have with your health care provider. Document Released: 01/20/2009 Document Revised: 09/01/2015 Document Reviewed: 04/30/2014 Elsevier Interactive Patient Education  2017 [employer].

## 2019-01-30 NOTE — Progress Notes (Signed)
Subjective:   Terry Love is a 83 y.o. male who presents for Medicare Annual/Subsequent preventive examination.  Location of Patient: Home Location of Provider: Telehealth Consent was obtain for visit to be over via telehealth.  I verified that I am speaking with the correct person using two identifiers.   Review of Systems:    Cardiac Risk Factors include: advanced age (>56men, >42 women);dyslipidemia     Objective:    Vitals: BP (!) 144/72   Pulse 65   Resp 15   Ht 5\' 10"  (1.778 m)   Wt 208 lb (94.3 kg)   BMI 29.84 kg/m   Body mass index is 29.84 kg/m.  Advanced Directives 03/24/2018 03/04/2018 03/04/2018 01/27/2018 08/27/2016 06/24/2016 06/24/2016  Does Patient Have a Medical Advance Directive? No No No Yes Yes No No  Type of Advance Directive - - - Living will;Healthcare Power of [place];Living will - -  Does patient want to make changes to medical advance directive? - - - No - Patient declined - - -  Copy of [place] in Chart? - - - Yes No - copy requested - -  Would patient like information on creating a medical advance directive? No - Patient declined No - Patient declined - - - No - Patient declined -  Pre-existing out of facility DNR order (yellow form or pink MOST form) - - - - - - -    Tobacco Social History   Tobacco Use  Smoking Status Never Smoker  Smokeless Tobacco Former Systems developer  . 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 Anxiety   . Bradycardia    Chronic   . Colon polyps   . Depression   . Facial tic   . Hearing loss   . Hyperlipidemia   . 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 Obesity   . Seasonal allergies    Past Surgical History:  Procedure Laterality Date  . carpal tunnel  release right  2006  . COLONOSCOPY    . COLONOSCOPY  01/25/2011   Dr. Rehman:Examination performed to cecum Pan colonic diverticulosis/2 small polyps ablated via cold biopsy from transverse colon/External hemorrhoids, benign polyps  . COLONOSCOPY N/A 06/29/2013   Procedure: COLONOSCOPY;  Surgeon: Daneil Dolin, MD;  Location: [facility];  Service: Endoscopy;  Laterality: N/A;  10:45  . EYE SURGERY     left eye cataracts   . PACEMAKER IMPLANT N/A 03/24/2018   Procedure: PACEMAKER IMPLANT;  Surgeon: Evans Lance, MD;  Location: [facility];  Service: Cardiovascular;  Laterality: N/A;   Family History  Problem Relation Age of Onset  . Colon cancer Mother        diagnosed at age 52  . Aneurysm Father   . Aneurysm Brother   . Stroke Brother    Social History   Socioeconomic History  .  Marital status: Married    Spouse name: Ceasar Lund   . Number of children: 0  . Years of education: 55  . Highest education level: 12th grade  Occupational History  . Occupation: retired  Scientific laboratory technician  . Financial resource strain: Not very hard  . Food insecurity    Worry: Never true    Inability: Never true  . Transportation needs    Medical: No    Non-medical: No  Tobacco Use  . Smoking status: Never Smoker  . Smokeless tobacco: Former Systems developer    Types: Chew  Substance and Sexual Activity  . Alcohol use: No    Alcohol/week: 0.0 standard drinks  . Drug use: No  . Sexual activity: Not Currently  Lifestyle  . Physical activity    Days per week: 3 days    Minutes per session: 20 min  . Stress: Not at all  Relationships  . Social  Herbalist on phone: Once a week    Gets together: Once a week    Attends religious service: 1 to 4 times per year    Active member of club or organization: Yes    Attends meetings of clubs or organizations: 1 to 4 times per year    Relationship status: Married  Other Topics Concern  . Not on file  Social History Narrative  . Not on file    Outpatient Encounter Medications as of 01/30/2019  Medication Sig  . aspirin (BAYER ASPIRIN EC LOW DOSE) 81 MG EC tablet Take 81 mg by mouth at bedtime.   Marland Kitchen atorvastatin (LIPITOR) 40 MG tablet TAKE 1 TABLET AT BEDTIME  . azelastine (ASTELIN) 0.1 % nasal spray Place 2 sprays into both nostrils 2 (two) times daily. Use in each nostril as directed (Patient taking differently: Place 2 sprays into both nostrils 2 (two) times daily as needed (allergies.). )  . Brinzolamide-Brimonidine (SIMBRINZA) 1-0.2 % SUSP Place 1 drop into both eyes 2 (two) times daily.   . budesonide-formoterol (SYMBICORT) 160-4.5 MCG/ACT inhaler Inhale 2 puffs into the lungs 2 (two) times daily. TAKE 2 PUFFS BY MOUTH TWICE A DAY  . calcium-vitamin D (OSCAL WITH D) 500-200 MG-UNIT tablet Take 1 tablet by mouth 2 (two) times daily.  Marland Kitchen gabapentin (NEURONTIN) 100 MG capsule TAKE 1 CAPSULE BY MOUTH THREE TIMES A DAY  . latanoprost (XALATAN) 0.005 % ophthalmic solution Place 1 drop into both eyes at bedtime.  . Multiple Vitamin (MULTIVITAMIN WITH MINERALS) TABS tablet Take 1 tablet by mouth at bedtime.  Marland Kitchen venlafaxine XR (EFFEXOR-XR) 75 MG 24 hr capsule TAKE 1 CAPSULE BY MOUTH DAILY WITH BREAKFAST  . VENTOLIN HFA 108 (90 Base) MCG/ACT inhaler INHALE 2 PUFFS INTO THE LUNGS EVERY 4 (FOUR) HOURS AS NEEDED FOR WHEEZING OR SHORTNESS OF BREATH  . vitamin C (ASCORBIC ACID) 500 MG tablet Take 500 mg by mouth at bedtime.   . Vitamin D, Ergocalciferol, (DRISDOL) 1.25 MG (50000 UT) CAPS capsule Take 1 capsule (50,000 Units total) by mouth every Monday. In the morning  . Wheat Dextrin  (BENEFIBER DRINK MIX PO) Take 1 Dose by mouth daily as needed (for regularity). Use as directed daily    No facility-administered encounter medications on file as of 01/30/2019.     Activities of Daily Living In your present state of health, do you have any difficulty performing the following activities: 01/30/2019 03/24/2018  Hearing? Y N  Vision? Y N  Difficulty concentrating or making decisions? Y N  Comment something -  Walking or climbing stairs? N N  Dressing or bathing? N N  Doing errands, shopping? N N  Preparing Food and eating ? N -  Using the Toilet? N -  In the past six months, have you accidently leaked urine? N -  Do you have problems with loss of bowel control? N -  Managing your Medications? Y -  Managing your Finances? Y -  Housekeeping or managing your Housekeeping? N -  Some recent data might be hidden    Patient Care Team: Fayrene Helper, MD as PCP - General Herminio Commons, MD as PCP - Cardiology (Cardiology) Gala Romney Cristopher Estimable, MD as Consulting Physician (Gastroenterology)   Assessment:   This is a routine wellness examination for [place].  Exercise Activities and Dietary recommendations Current Exercise Habits: Home exercise routine, Type of exercise: walking, Time (Minutes): 30, Frequency (Times/Week): 4, Weekly Exercise (Minutes/Week): 120, Intensity: Mild, Exercise limited by: None identified  Goals    . DIET - EAT MORE FRUITS AND VEGETABLES    . DIET - REDUCE SUGAR INTAKE    . Exercise 3x per week (30 min per time)     Recommend starting a routine exercise program at least 3 days a week for 30-45 minutes at a time as tolerated.         Fall Risk Fall Risk  01/30/2019 12/30/2018 08/13/2018 07/30/2018 06/03/2018  Falls in the past year? 0 0 0 0 0  Number falls in past yr: 0 0 0 0 0  Injury with Fall? 0 0 0 0 0  Risk for fall due to : - - - - Medication side effect   Is the patient's home free of loose throw rugs in walkways, pet beds,  electrical cords, etc?   yes      Grab bars in the bathroom? yes      Handrails on the stairs?   yes      Adequate lighting?   yes     Depression Screen PHQ 2/9 Scores 01/30/2019 12/30/2018 06/03/2018 03/18/2018  PHQ - 2 Score 2 5 3  0  PHQ- 9 Score 11 18 9  0    Cognitive Function     6CIT Screen 01/30/2019 01/27/2018 08/27/2016  What Year? 0 points 0 points 0 points  What month? 0 points 0 points 0 points  What time? 0 points 0 points 0 points  Count back from 20 0 points 0 points 0 points  Months in reverse 0 points 0 points 0 points  Repeat phrase 0 points 0 points 0 points  Total Score 0 0 0    Immunization History  Administered Date(s) Administered  . Fluad Quad(high Dose 65+) 11/27/2018  . Influenza Split 01/01/2013, 01/01/2014, 01/03/2018  . Influenza Whole 01/06/2007, 01/04/2009, 12/23/2009, 12/14/2010  . Influenza, High Dose Seasonal PF 01/03/2018  . Influenza,inj,Quad PF,6+ Mos 12/14/2015, 12/03/2016, 01/28/2017  . Pneumococcal Conjugate-13 10/13/2013  . Pneumococcal Polysaccharide-23 02/21/2004  . Td 12/23/2009  . Tdap 02/20/2014  . Zoster 01/06/2007  . Zoster Recombinat (Shingrix) 01/10/2017, 03/11/2017    Qualifies for Shingles Vaccine?  completed  Screening Tests Health Maintenance  Topic Date Due  . TETANUS/TDAP  02/21/2024  . INFLUENZA VACCINE  Completed  . PNA vac Low Risk Adult  Completed   Cancer Screenings: Lung: Low Dose CT Chest recommended if Age 63-80 years, 30 pack-year currently smoking OR have quit w/in 15years. Patient does not qualify. Colorectal:  n/a  Additional Screenings:   Hepatitis C Screening:  Plan:       1. Encounter for Medicare annual wellness exam  I have personally reviewed and noted the following in the patient's chart:   . Medical and social history . Use of alcohol, tobacco or illicit drugs  . Current medications and supplements . Functional ability and status . Nutritional status . Physical activity  . Advanced directives . List of other physicians . Hospitalizations, surgeries, and ER visits in previous 12 months . Vitals . Screenings to include cognitive, depression, and falls . Referrals and appointments  In addition, I have reviewed and discussed with patient certain preventive protocols, quality metrics, and best practice recommendations. A written personalized care plan for preventive services as well as general preventive health recommendations were provided to patient.     I provided 20 minutes of non-face-to-face time during this encounter.   Perlie Mayo, NP  01/30/2019

## 2019-02-09 IMAGING — DX DG CHEST 2V
2 series · 2 of 2 positions shown · non-contrast
Comparison: 03/04/2018

CLINICAL DATA: Pacemaker placement

EXAM:
CHEST - 2 VIEW

[chest pa]
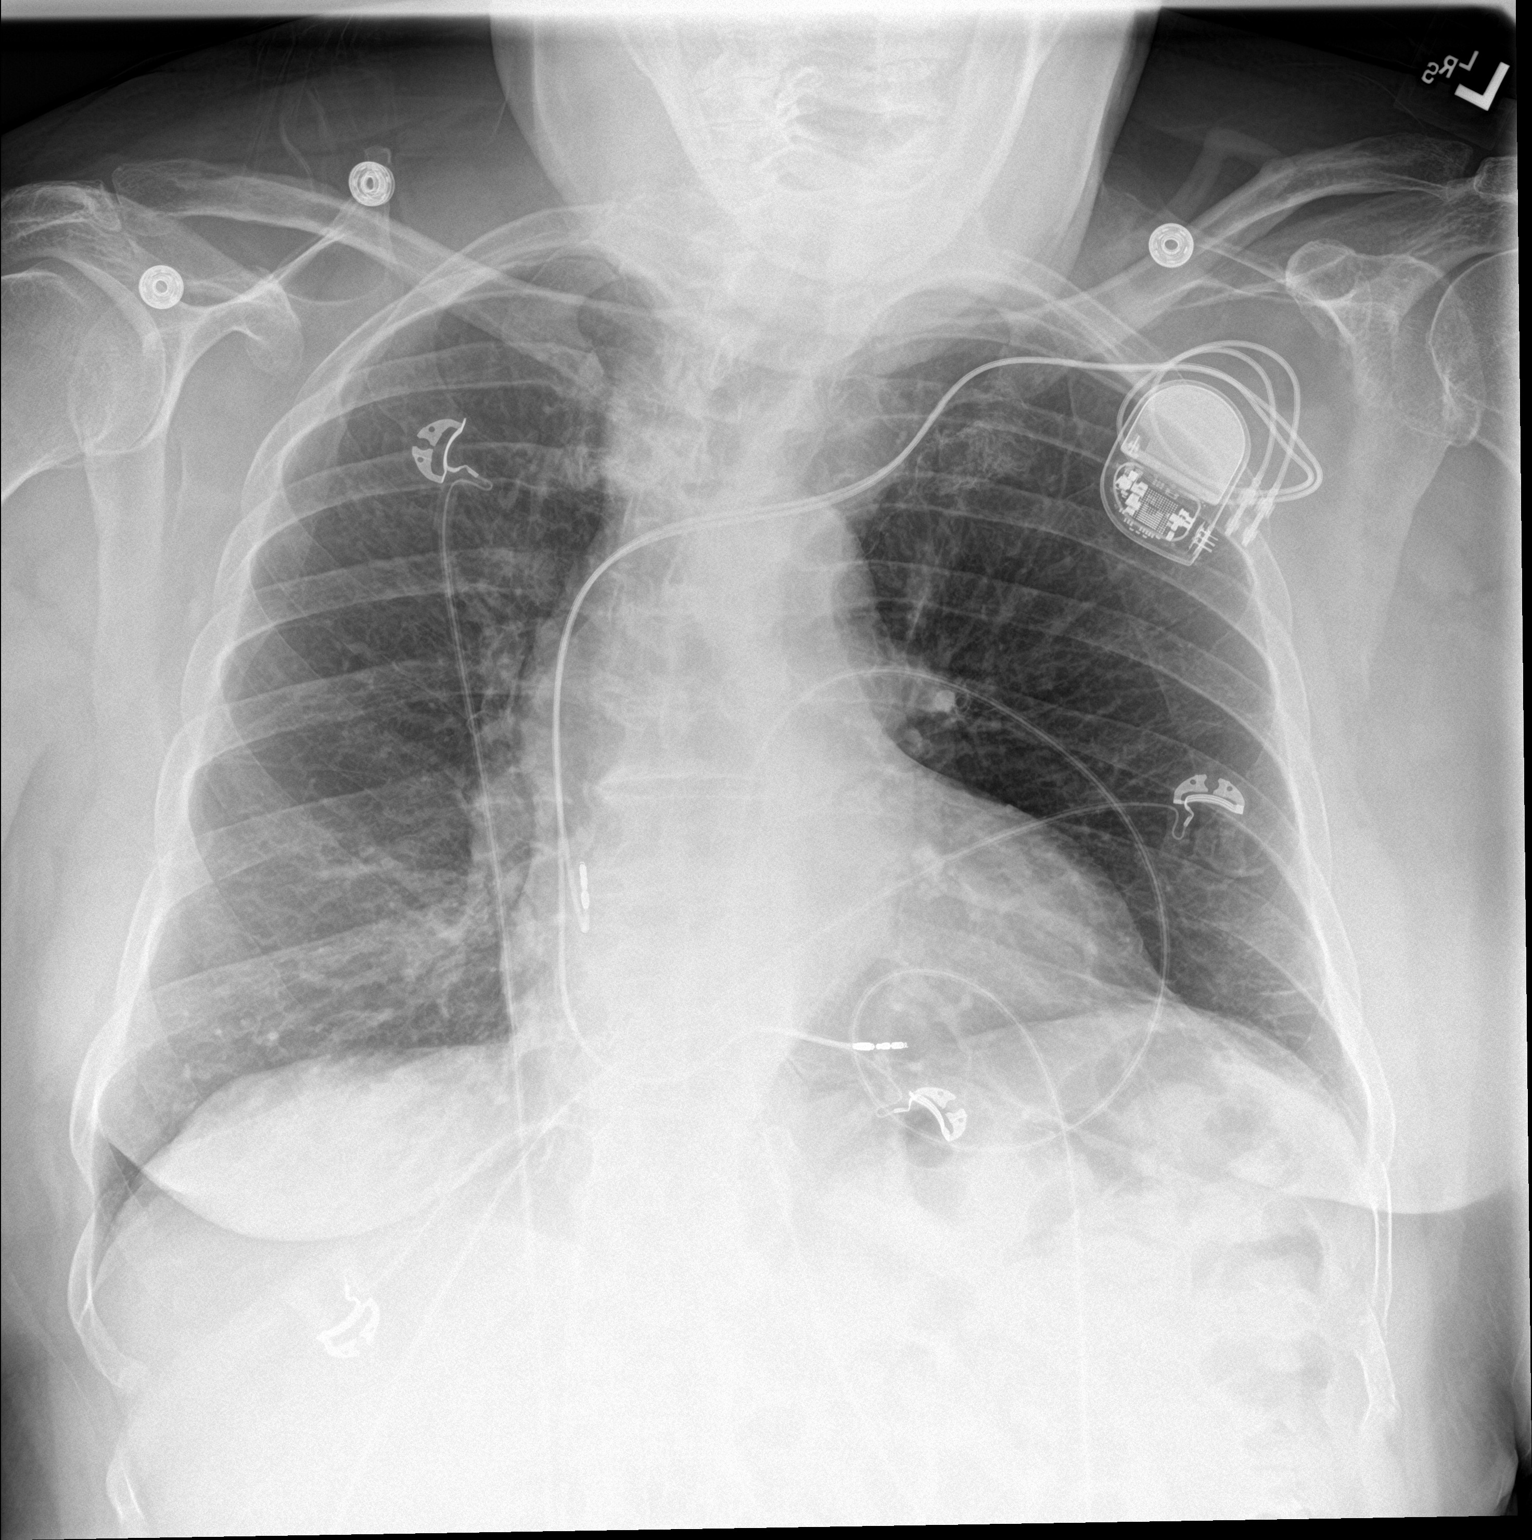

[chest lat]
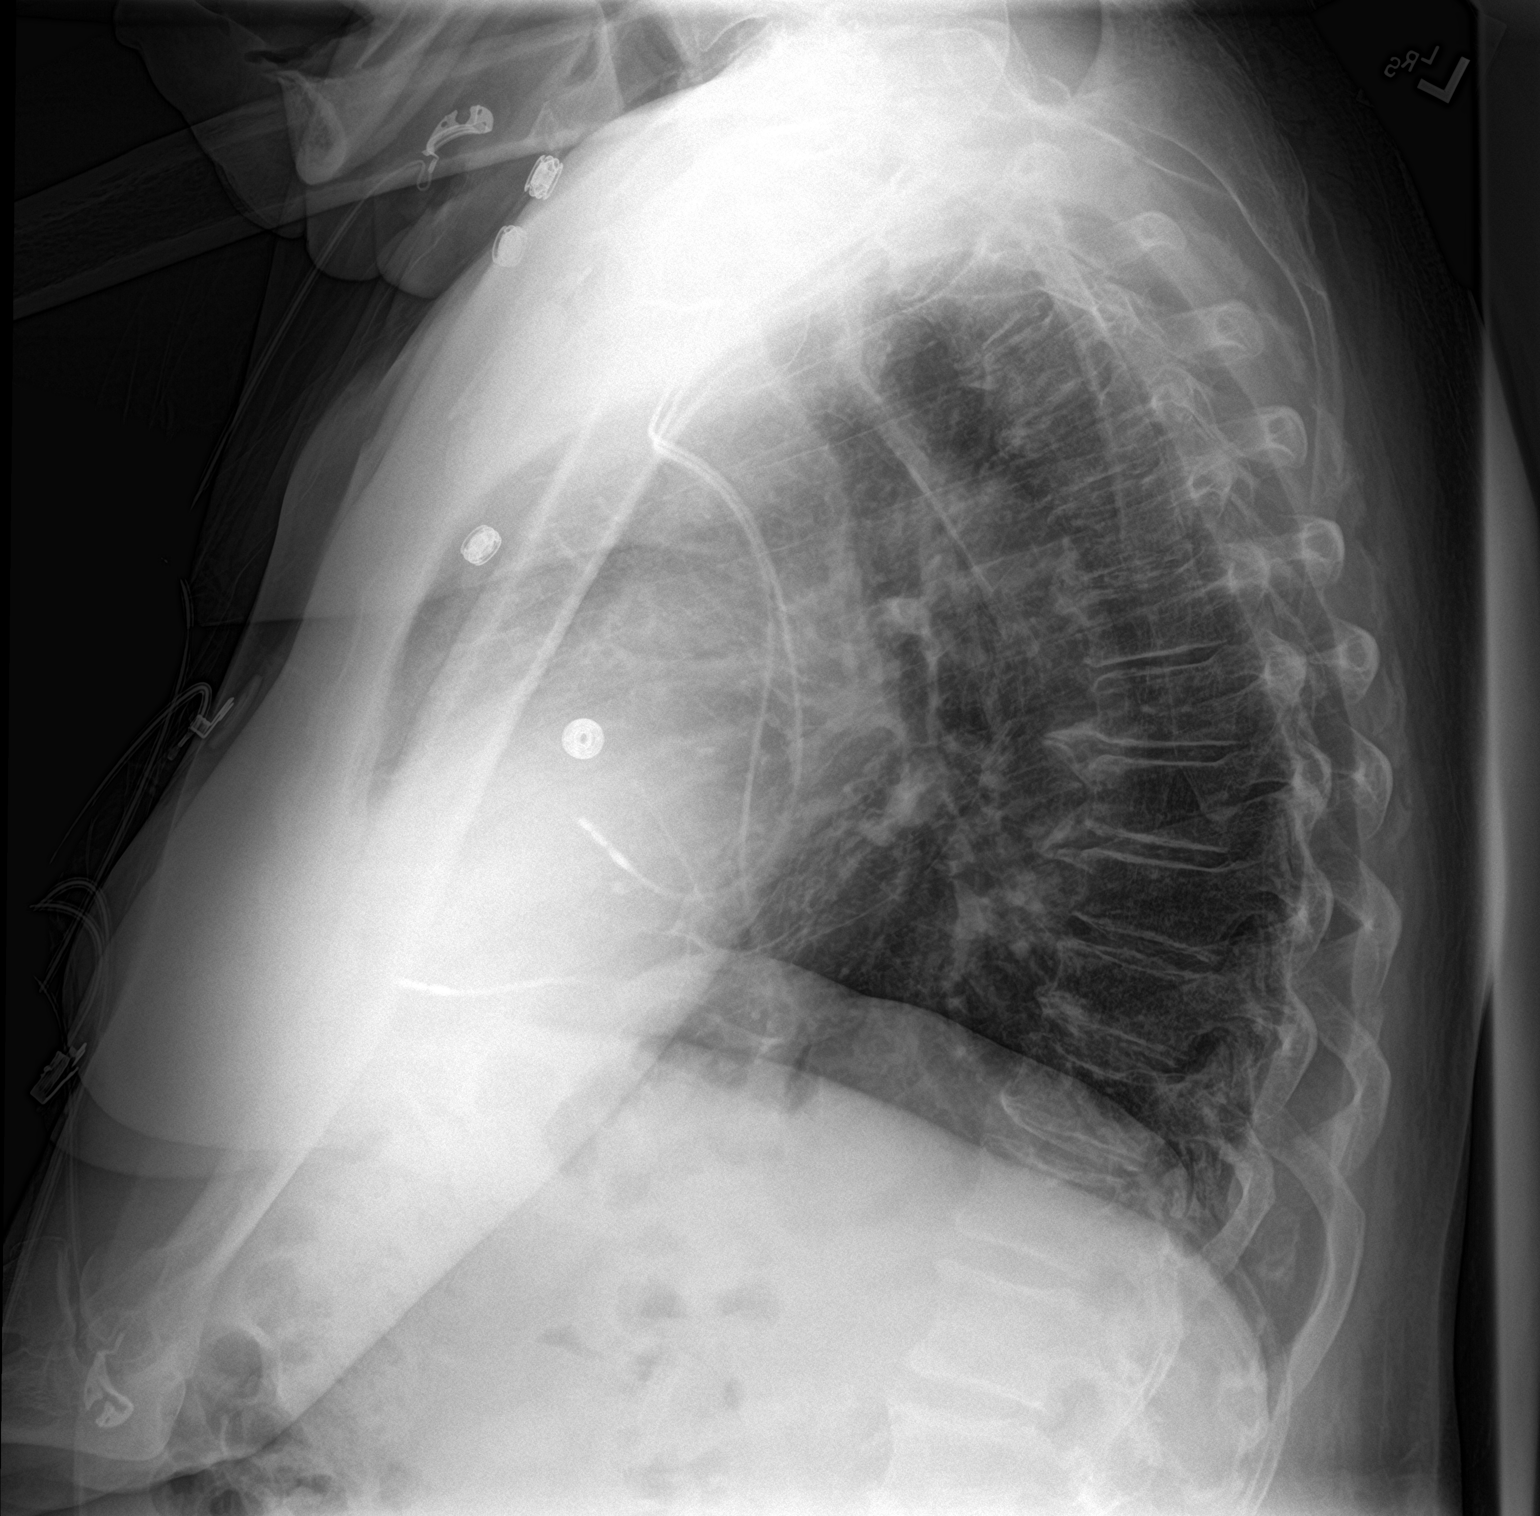

[2 of 2 positions shown; findings below may reference images not displayed]

FINDINGS: Left pacer has been placed with leads in the right atrium and right
ventricle. No pneumothorax. Heart is borderline in size. Bibasilar
linear densities, likely scarring. No confluent opacities or
effusions. No acute bony abnormality.
IMPRESSION: Left pacer placement without pneumothorax.

Bibasilar scarring.

## 2019-03-24 ENCOUNTER — Other Ambulatory Visit: Payer: Self-pay | Admitting: Pulmonary Disease

## 2019-04-07 ENCOUNTER — Ambulatory Visit (INDEPENDENT_AMBULATORY_CARE_PROVIDER_SITE_OTHER): Payer: Medicare Other | Admitting: *Deleted

## 2019-04-07 DIAGNOSIS — I495 Sick sinus syndrome: Secondary | ICD-10-CM | POA: Diagnosis not present

## 2019-04-07 LAB — CUP PACEART REMOTE DEVICE CHECK
Date Time Interrogation Session: 20201229103655
Implantable Lead Implant Date: 20191216
Implantable Lead Implant Date: 20191216
Implantable Lead Location: 753859
Implantable Lead Location: 753860
Implantable Lead Model: 377
Implantable Lead Model: 377
Implantable Lead Serial Number: 80840322
Implantable Lead Serial Number: 80924555
Implantable Pulse Generator Implant Date: 20191216
Pulse Gen Model: 407145
Pulse Gen Serial Number: 69486427

## 2019-04-18 ENCOUNTER — Other Ambulatory Visit: Payer: Self-pay | Admitting: Pulmonary Disease

## 2019-04-22 DIAGNOSIS — Z23 Encounter for immunization: Secondary | ICD-10-CM | POA: Diagnosis not present

## 2019-04-29 ENCOUNTER — Encounter: Payer: Self-pay | Admitting: Family Medicine

## 2019-04-29 ENCOUNTER — Ambulatory Visit (INDEPENDENT_AMBULATORY_CARE_PROVIDER_SITE_OTHER): Payer: Medicare Other | Admitting: Family Medicine

## 2019-04-29 ENCOUNTER — Other Ambulatory Visit: Payer: Self-pay

## 2019-04-29 VITALS — BP 120/70 | HR 74 | Resp 15 | Ht 70.0 in | Wt 217.0 lb

## 2019-04-29 DIAGNOSIS — E559 Vitamin D deficiency, unspecified: Secondary | ICD-10-CM | POA: Diagnosis not present

## 2019-04-29 DIAGNOSIS — J4530 Mild persistent asthma, uncomplicated: Secondary | ICD-10-CM | POA: Diagnosis not present

## 2019-04-29 DIAGNOSIS — Z950 Presence of cardiac pacemaker: Secondary | ICD-10-CM

## 2019-04-29 DIAGNOSIS — J309 Allergic rhinitis, unspecified: Secondary | ICD-10-CM

## 2019-04-29 DIAGNOSIS — E785 Hyperlipidemia, unspecified: Secondary | ICD-10-CM

## 2019-04-29 DIAGNOSIS — F322 Major depressive disorder, single episode, severe without psychotic features: Secondary | ICD-10-CM | POA: Diagnosis not present

## 2019-04-29 DIAGNOSIS — E669 Obesity, unspecified: Secondary | ICD-10-CM | POA: Diagnosis not present

## 2019-04-29 MED ORDER — AZELASTINE HCL 0.1 % NA SOLN: 2.0000 | Freq: Two times a day (BID) | NASAL | 12 refills | Status: DC

## 2019-04-29 MED ORDER — BENZONATATE 100 MG PO CAPS: 100.0000 mg | ORAL_CAPSULE | Freq: Two times a day (BID) | ORAL | 0 refills | Status: DC | PRN

## 2019-04-29 NOTE — Patient Instructions (Addendum)
F/U in end August, call if you need me before   For cough , tessalon perles are prescribed, short term, and for allergies please start using Astelin nose spray every day  Thankful that you had Moderna on 04/23/2019, please get the 2nd one in 4 weeks    Labs today lipid, cmp and EGFR fasting and vit D and TSH today  Think about what you will eat, plan ahead. Choose " clean, green, fresh or frozen" over canned, processed or packaged foods which are more sugary, salty and fatty. 70 to 75% of food eaten should be vegetables and fruit. Three meals at set times with snacks allowed between meals, but they must be fruit or vegetables. Aim to eat over a 12 hour period , example 7 am to 7 pm, and STOP after  your last meal of the day. Drink water,generally about 64 ounces per day, no other drink is as healthy. Fruit juice is best enjoyed in a healthy way, by EATING the fruit.  It is important that you exercise regularly at least 30 minutes 5 times a week. If you develop chest pain, have severe difficulty breathing, or feel very tired, stop exercising immediately and seek medical attention

## 2019-05-01 LAB — COMPLETE METABOLIC PANEL WITH GFR
AG Ratio: 1.5 (calc) (ref 1.0–2.5)
ALT: 17 U/L (ref 9–46)
AST: 23 U/L (ref 10–35)
Albumin: 4 g/dL (ref 3.6–5.1)
Alkaline phosphatase (APISO): 44 U/L (ref 35–144)
BUN: 16 mg/dL (ref 7–25)
CO2: 29 mmol/L (ref 20–32)
Calcium: 9.7 mg/dL (ref 8.6–10.3)
Chloride: 106 mmol/L (ref 98–110)
Creat: 0.78 mg/dL (ref 0.70–1.11)
GFR, Est African American: 98 mL/min/{1.73_m2} (ref >=60)
GFR, Est Non African American: 85 mL/min/{1.73_m2} (ref >=60)
Globulin: 2.7 g/dL (calc) (ref 1.9–3.7)
Glucose, Bld: 106 mg/dL — ABNORMAL HIGH (ref 65–99)
Potassium: 4.2 mmol/L (ref 3.5–5.3)
Sodium: 141 mmol/L (ref 135–146)
Total Bilirubin: 0.5 mg/dL (ref 0.2–1.2)
Total Protein: 6.7 g/dL (ref 6.1–8.1)

## 2019-05-01 LAB — TEST AUTHORIZATION

## 2019-05-01 LAB — LIPID PANEL
Cholesterol: 164 mg/dL (ref <200)
HDL: 45 mg/dL (ref >=40)
LDL Cholesterol (Calc): 98 mg/dL (calc)
Non-HDL Cholesterol (Calc): 119 mg/dL (calc) (ref <130)
Total CHOL/HDL Ratio: 3.6 (calc) (ref <5.0)
Triglycerides: 110 mg/dL (ref <150)

## 2019-05-01 LAB — VITAMIN D 25 HYDROXY (VIT D DEFICIENCY, FRACTURES): Vit D, 25-Hydroxy: 60 ng/mL (ref 30–100)

## 2019-05-01 LAB — T3, FREE: T3, Free: 2.6 pg/mL (ref 2.3–4.2)

## 2019-05-01 LAB — TSH: TSH: 1.12 mIU/L (ref 0.40–4.50)

## 2019-05-01 LAB — T4, FREE: Free T4: 1 ng/dL (ref 0.8–1.8)

## 2019-05-02 ENCOUNTER — Encounter: Payer: Self-pay | Admitting: Family Medicine

## 2019-05-02 NOTE — Progress Notes (Signed)
   Terry Love     MRN: [id number]      DOB: December 19, 1935   HPI Terry Love is here for follow up and re-evaluation of chronic medical conditions, medication management and review of any available recent lab and radiology data.  Preventive health is updated, specifically  Cancer screening and Immunization.   Has had first covid vaccine on 04/23/2019 Questions or concerns regarding consultations or procedures which the PT has had in the interim are  addressed. The PT denies any adverse reactions to current medications since the last visit.  There are no new concerns.  There are no specific complaints   ROS Denies recent fever or chills. C/o increased and uncontrolled allergies and cough, denies sputum, fever or chills Denies sinus pressure, nasal congestion, ear pain or sore throat. Denies chest congestion, productive cough or wheezing. Denies chest pains, palpitations and leg swelling Denies abdominal pain, nausea, vomiting,diarrhea or constipation.   Denies dysuria, frequency, hesitancy or incontinence. Denies uncontrolled  joint pain, swelling and limitation in mobility. Denies headaches, seizures, numbness, or tingling. C/o  Depression,though improved, denies  anxiety or insomnia. Denies skin break down or rash.   PE  BP 120/70   Pulse 74   Resp 15   Ht 5\' 10"  (1.778 m)   Wt 217 lb 0.6 oz (98.4 kg)   SpO2 94%   BMI 31.14 kg/m   Patient alert and oriented and in no cardiopulmonary distress.  HEENT: No facial asymmetry, EOMI,     Neck supple .  Chest: Clear to auscultation bilaterally.  CVS: S1, S2 no murmurs, no S3.Regular rate.  ABD: Soft non tender.   Ext: No edema  MS: Adequate ROM spine, shoulders, hips and knees.  Skin: Intact, no ulcerations or rash noted.  Psych: Good eye contact, normal affect. Memory intact not anxious or depressed appearing.  CNS: CN 2-12 intact, power,  normal throughout.no focal deficits noted.   Assessment & Plan  Allergic  rhinitis Uncontrolled, tessalon perles and and Astelin to be started  Mild persistent asthma Controlled, no change in medication   Pacemaker Good function, regular rate, managed by cardiology  Hyperlipidemia LDL goal <100 Hyperlipidemia:Low fat diet discussed and encouraged.   Lipid Panel  Lab Results  Component Value Date   CHOL 164 04/29/2019   HDL 45 04/29/2019   LDLCALC 98 04/29/2019   TRIG 110 04/29/2019   CHOLHDL 3.6 04/29/2019   Controlled, no change in medication     Depression, major, single episode, severe (HCC) Marked improvement , continue meds and therapy  Obesity (BMI 30.0-34.9)  Patient re-educated about  the importance of commitment to a  minimum of 150 minutes of exercise per week as able.  The importance of healthy food choices with portion control discussed, as well as eating regularly and within a 12 hour window most days. The need to choose "clean , green" food 50 to 75% of the time is discussed, as well as to make water the primary drink and set a goal of 64 ounces water daily.    Weight /BMI 04/29/2019 01/30/2019 12/30/2018  WEIGHT 217 lb 0.6 oz 208 lb 208 lb  HEIGHT 5\' 10"  5\' 10"  5\' 10"   BMI 31.14 kg/m2 29.84 kg/m2 29.84 kg/m2  marked weight gain, will work on diet and increase exercise

## 2019-05-02 NOTE — Assessment & Plan Note (Signed)
Hyperlipidemia:Low fat diet discussed and encouraged.   Lipid Panel  Lab Results  Component Value Date   CHOL 164 04/29/2019   HDL 45 04/29/2019   LDLCALC 98 04/29/2019   TRIG 110 04/29/2019   CHOLHDL 3.6 04/29/2019   Controlled, no change in medication

## 2019-05-02 NOTE — Assessment & Plan Note (Signed)
Controlled, no change in medication  

## 2019-05-02 NOTE — Assessment & Plan Note (Signed)
  Patient re-educated about  the importance of commitment to a  minimum of 150 minutes of exercise per week as able.  The importance of healthy food choices with portion control discussed, as well as eating regularly and within a 12 hour window most days. The need to choose "clean , green" food 50 to 75% of the time is discussed, as well as to make water the primary drink and set a goal of 64 ounces water daily.    Weight /BMI 04/29/2019 01/30/2019 12/30/2018  WEIGHT 217 lb 0.6 oz 208 lb 208 lb  HEIGHT 5\' 10"  5\' 10"  5\' 10"   BMI 31.14 kg/m2 29.84 kg/m2 29.84 kg/m2  marked weight gain, will work on diet and increase exercise

## 2019-05-02 NOTE — Assessment & Plan Note (Signed)
Good function, regular rate, managed by cardiology

## 2019-05-02 NOTE — Assessment & Plan Note (Signed)
Marked improvement , continue meds and therapy

## 2019-05-02 NOTE — Assessment & Plan Note (Signed)
Uncontrolled, tessalon perles and and Astelin to be started

## 2019-05-12 ENCOUNTER — Encounter: Payer: Self-pay | Admitting: Nurse Practitioner

## 2019-05-12 ENCOUNTER — Other Ambulatory Visit: Payer: Self-pay

## 2019-05-12 ENCOUNTER — Ambulatory Visit (INDEPENDENT_AMBULATORY_CARE_PROVIDER_SITE_OTHER): Payer: Medicare Other | Admitting: Nurse Practitioner

## 2019-05-12 VITALS — BP 121/77 | HR 66 | Temp 96.6°F | Ht 67.0 in | Wt 213.6 lb

## 2019-05-12 DIAGNOSIS — D649 Anemia, unspecified: Secondary | ICD-10-CM | POA: Diagnosis not present

## 2019-05-12 DIAGNOSIS — R5383 Other fatigue: Secondary | ICD-10-CM | POA: Diagnosis not present

## 2019-05-12 NOTE — Progress Notes (Signed)
CC'ED TO PCP 

## 2019-05-12 NOTE — Assessment & Plan Note (Signed)
Anemia likely multifactorial in nature.  Colonoscopy up-to-date and next due in 2022 (next year).  He is on the recall list.  Denies any obvious bleeding.  Has been off iron sometime.  6 months ago his hemoglobin was stable and his ferritin normal.  Diet has been off iron for another 6 months I will recheck CBC and ferritin.  If this remains normal we can have him follow-up in 1 year.  Further recommendations to follow his labs.  Call for any noted bleeding.

## 2019-05-12 NOTE — Assessment & Plan Note (Signed)
Generally doing well with his fatigue since implant of permanent pacemaker.  No complaints of fatigue today.  Labs as per below, continue meds, follow-up in 1 year.  Call for any worsening or significant symptoms.

## 2019-05-12 NOTE — Progress Notes (Signed)
Referring Provider: Fayrene Helper, MD Primary Care Physician:  Fayrene Helper, MD Primary GI:  Dr. Gala Romney  Chief Complaint  Patient presents with  . Anemia    HPI:   Terry Love is a 84 y.o. male who presents for follow-up on anemia.  The patient was last seen in our office 11/06/2018 for the same as well as constipation.  Colonoscopy up-to-date and recommended repeat in 2022 due to polyps.  No previous EGD.  Baseline hemoglobin generally 13.5.  Fatigue improved somewhat after pacemaker placement.  Previous drop in hemoglobin to 12.2 increased to 12.8 with normal indices and ferritin.  At his last visit he noted good energy.  Some weight loss due to cutting out sweets and carbs trying to prevent diabetes.  Takes Benefiber which prevents constipation.  No other overt GI complaints.  Overall anemia likely multifactorial including chronic kidney disease, mild blood loss, and vitamin B12 deficiency (now corrected).  Recommended update labs and consider hematology referral if persistent anemia.  Follow-up in 6 months.  Labs completed 11/06/2018 include CBC which found stable hemoglobin at 12.8 again with normal indices, normal iron at 99, normal ferritin at 138.  Today he states he's doing well overall. He got his first COVID-19 vaccine. Appointment for second dose is 05/20/19. Energy stable overall. Denies abdominal pain, N/V, hematochezia, melena, fever, chills, unintentional weight loss. Denies URI or flu-like symptoms. Denies loss of sense of taste or smell. He checks his temperature regularly. Denies chest pain, dyspnea, dizziness, lightheadedness, syncope, near syncope. Denies any other upper or lower GI symptoms.  Past Medical History:  Diagnosis Date  . Anxiety   . Bradycardia    Chronic   . Colon polyps   . Depression   . Facial tic   . Hearing loss   . Hyperlipidemia   . 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 Obesity   . Seasonal allergies      Past Surgical History:  Procedure Laterality Date  . carpal tunnel  release right  2006  . COLONOSCOPY    . COLONOSCOPY  01/25/2011   Dr. Rehman:Examination performed to cecum Pan colonic diverticulosis/2 small polyps ablated via cold biopsy from transverse colon/External hemorrhoids, benign polyps  . COLONOSCOPY N/A 06/29/2013   Procedure: COLONOSCOPY;  Surgeon: Daneil Dolin, MD;  Location: [facility];  Service: Endoscopy;  Laterality: N/A;  10:45  . EYE SURGERY     left eye cataracts   . PACEMAKER IMPLANT N/A 03/24/2018   Procedure: PACEMAKER IMPLANT;  Surgeon: Evans Lance, MD;  Location: [facility];  Service: Cardiovascular;  Laterality: N/A;    Current Outpatient Medications  Medication Sig Dispense Refill  . aspirin (BAYER ASPIRIN EC LOW DOSE) 81 MG EC tablet Take 81 mg by mouth at bedtime.     Marland Kitchen atorvastatin (LIPITOR) 40 MG tablet TAKE 1 TABLET AT BEDTIME 90 tablet 1  . azelastine (ASTELIN) 0.1 % nasal spray Place 2 sprays into both nostrils 2 (two) times daily. Use in each nostril as directed 30 mL 12  . benzonatate (TESSALON) 100 MG capsule Take 1 capsule (100 mg total) by mouth 2 (two) times daily as needed for cough. 20 capsule 0  . Brinzolamide-Brimonidine (SIMBRINZA) 1-0.2 % SUSP Place 1 drop into both eyes 2 (two) times daily.     . calcium-vitamin D (OSCAL WITH D) 500-200 MG-UNIT tablet Take 1 tablet by mouth 2 (two) times daily. 150 tablet 1  . dorzolamide (  TRUSOPT) 2 % ophthalmic solution 1 drop 2 (two) times daily.    Marland Kitchen latanoprost (XALATAN) 0.005 % ophthalmic solution Place 1 drop into both eyes at bedtime.    . Multiple Vitamin (MULTIVITAMIN WITH MINERALS) TABS tablet Take 1 tablet by mouth at bedtime.    Marland Kitchen venlafaxine XR (EFFEXOR-XR) 75 MG 24 hr capsule TAKE 1 CAPSULE BY MOUTH DAILY WITH BREAKFAST 90 capsule 2  . VENTOLIN HFA 108 (90 Base) MCG/ACT inhaler INHALE 2 PUFFS INTO THE LUNGS EVERY 4 (FOUR) HOURS AS NEEDED FOR WHEEZING OR SHORTNESS OF BREATH 18  Inhaler 5  . vitamin C (ASCORBIC ACID) 500 MG tablet Take 500 mg by mouth at bedtime.     . Vitamin D, Ergocalciferol, (DRISDOL) 1.25 MG (50000 UT) CAPS capsule Take 1 capsule (50,000 Units total) by mouth every Monday. In the morning 90 capsule 3  . Wheat Dextrin (BENEFIBER DRINK MIX PO) Take 1 Dose by mouth daily as needed (for regularity). Use as directed daily      No current facility-administered medications for this visit.    Allergies as of 05/12/2019  . (No Known Allergies)    Family History  Problem Relation Age of Onset  . Colon cancer Mother        diagnosed at age 49  . Aneurysm Father   . Aneurysm Brother   . Stroke Brother     Social History   Socioeconomic History  . Marital status: Married    Spouse name: Ceasar Lund   . Number of children: 0  . Years of education: 24  . Highest education level: 12th grade  Occupational History  . Occupation: retired  Tobacco Use  . Smoking status: Never Smoker  . Smokeless tobacco: Former Systems developer    Types: Chew  Substance and Sexual Activity  . Alcohol use: No    Alcohol/week: 0.0 standard drinks  . Drug use: No  . Sexual activity: Not Currently  Other Topics Concern  . Not on file  Social History Narrative  . Not on file   Social Determinants of Health   Financial Resource Strain:   . Difficulty of Paying Living Expenses: Not on file  Food Insecurity:   . Worried About Charity fundraiser in the Last Year: Not on file  . Ran Out of Food in the Last Year: Not on file  Transportation Needs:   . Lack of Transportation (Medical): Not on file  . Lack of Transportation (Non-Medical): Not on file  Physical Activity:   . Days of Exercise per Week: Not on file  . Minutes of Exercise per Session: Not on file  Stress:   . Feeling of Stress : Not on file  Social Connections:   . Frequency of Communication with Friends and Family: Not on file  . Frequency of Social Gatherings with Friends and Family: Not on file  . Attends  Religious Services: Not on file  . Active Member of Clubs or Organizations: Not on file  . Attends Archivist Meetings: Not on file  . Marital Status: Not on file    Review of Systems: General: Negative for anorexia, weight loss, fever, chills, fatigue, weakness. ENT: Negative for hoarseness, difficulty swallowing. CV: Negative for chest pain, angina, palpitations, peripheral edema.  Respiratory: Negative for dyspnea at rest, cough, sputum, wheezing.  GI: See history of present illness. Endo: Negative for unusual weight change.  Heme: Negative for bruising or bleeding. Allergy: Negative for rash or hives.   Physical Exam: BP 121/77  Pulse 66   Temp (!) 96.6 F (35.9 C) (Oral)   Ht 5\' 7"  (1.702 m)   Wt 213 lb 9.6 oz (96.9 kg)   BMI 33.45 kg/m  General:   Alert and oriented. Pleasant and cooperative. Well-nourished and well-developed.  Eyes:  Without icterus, sclera clear and conjunctiva pink.  Ears:  Normal auditory acuity. Cardiovascular:  S1, S2 present without murmurs appreciated. Extremities without clubbing or edema. Respiratory:  Clear to auscultation bilaterally. No wheezes, rales, or rhonchi. No distress.  Gastrointestinal:  +BS, soft, non-tender and non-distended. No HSM noted. No guarding or rebound. No masses appreciated.  Rectal:  Deferred  Musculoskalatal:  Symmetrical without gross deformities. Neurologic:  Alert and oriented x4;  grossly normal neurologically. Psych:  Alert and cooperative. Normal mood and affect. Heme/Lymph/Immune: No excessive bruising noted.    05/12/2019 9:54 AM   Disclaimer: This note was dictated with voice recognition software. Similar sounding words can inadvertently be transcribed and may not be corrected upon review.

## 2019-05-12 NOTE — Patient Instructions (Signed)
Your health issues we discussed today were:   Anemia: 1. Your anemia is likely due to many causes such as chronic kidney disease, previous vitamin B12 deficiency, and mild blood loss 2. Call and let us know if you see any bleeding in your stools or pitch black/tarry stools 3. Have your labs drawn when you are able to 4. We will call you with the results 5. Call us if you have any worsening or severe symptoms  Overall I recommend:  1. Continue your other current medications 2. Return for follow-up in 1 year 3. Call us if you have any questions or concerns   ---------------------------------------------------------------  I'm glad you got your first coronavirus vaccine!  Because of recent events of COVID-19 ("Coronavirus"), follow CDC recommendations:  1. Wash your hand frequently 2. Avoid touching your face 3. Stay away from people who are sick 4. Wear a mask if you are around other people 5. If you have symptoms such as fever, cough, shortness of breath then call your healthcare provider for further guidance 6. If you are sick, STAY AT HOME unless otherwise directed by your healthcare provider. 7. Follow directions from state and national officials regarding staying safe  ---------------------------------------------------------------   At [facility] we value your feedback. You may receive a survey about your visit today. Please share your experience as we strive to create trusting relationships with our patients to provide genuine, compassionate, quality care.  We appreciate your understanding and patience as we review any laboratory studies, imaging, and other diagnostic tests that are ordered as we care for you. Our office policy is 5 business days for review of these results, and any emergent or urgent results are addressed in a timely manner for your best interest. If you do not hear from our office in 1 week, please contact us.   We also encourage the use of  MyChart, which contains your medical information for your review as well. If you are not enrolled in this feature, an access code is on this after visit summary for your convenience. Thank you for allowing [place] to be involved in your care.  It was great to see you today!  I hope you have a great day!!

## 2019-05-13 ENCOUNTER — Other Ambulatory Visit: Payer: Self-pay | Admitting: Pulmonary Disease

## 2019-05-13 LAB — CBC WITH DIFFERENTIAL/PLATELET
Absolute Monocytes: 735 cells/uL (ref 200–950)
Basophils Absolute: 98 cells/uL (ref 0–200)
Basophils Relative: 1.5 %
Eosinophils Absolute: 273 cells/uL (ref 15–500)
Eosinophils Relative: 4.2 %
HCT: 40.9 % (ref 38.5–50.0)
Hemoglobin: 13.7 g/dL (ref 13.2–17.1)
Lymphs Abs: 1820 cells/uL (ref 850–3900)
MCH: 30.3 pg (ref 27.0–33.0)
MCHC: 33.5 g/dL (ref 32.0–36.0)
MCV: 90.5 fL (ref 80.0–100.0)
MPV: 9.9 fL (ref 7.5–12.5)
Monocytes Relative: 11.3 %
Neutro Abs: 3575 cells/uL (ref 1500–7800)
Neutrophils Relative %: 55 %
Platelets: 313 10*3/uL (ref 140–400)
RBC: 4.52 10*6/uL (ref 4.20–5.80)
RDW: 11.3 % (ref 11.0–15.0)
Total Lymphocyte: 28 %
WBC: 6.5 10*3/uL (ref 3.8–10.8)

## 2019-05-13 LAB — FERRITIN: Ferritin: 175 ng/mL (ref 24–380)

## 2019-05-20 DIAGNOSIS — Z23 Encounter for immunization: Secondary | ICD-10-CM | POA: Diagnosis not present

## 2019-06-04 NOTE — Progress Notes (Signed)
PT is aware of results and plan.  

## 2019-06-07 ENCOUNTER — Other Ambulatory Visit: Payer: Self-pay | Admitting: Pulmonary Disease

## 2019-06-09 ENCOUNTER — Other Ambulatory Visit: Payer: Self-pay | Admitting: Family Medicine

## 2019-06-09 DIAGNOSIS — E559 Vitamin D deficiency, unspecified: Secondary | ICD-10-CM

## 2019-06-10 ENCOUNTER — Other Ambulatory Visit: Payer: Self-pay

## 2019-06-10 ENCOUNTER — Encounter: Payer: Self-pay | Admitting: Family Medicine

## 2019-06-10 ENCOUNTER — Ambulatory Visit (INDEPENDENT_AMBULATORY_CARE_PROVIDER_SITE_OTHER): Payer: Medicare Other | Admitting: Family Medicine

## 2019-06-10 VITALS — BP 124/69 | Ht 67.0 in | Wt 213.0 lb

## 2019-06-10 DIAGNOSIS — Z87891 Personal history of nicotine dependence: Secondary | ICD-10-CM | POA: Diagnosis not present

## 2019-06-10 DIAGNOSIS — E785 Hyperlipidemia, unspecified: Secondary | ICD-10-CM

## 2019-06-10 DIAGNOSIS — F322 Major depressive disorder, single episode, severe without psychotic features: Secondary | ICD-10-CM | POA: Diagnosis not present

## 2019-06-10 DIAGNOSIS — E669 Obesity, unspecified: Secondary | ICD-10-CM

## 2019-06-10 DIAGNOSIS — R059 Cough, unspecified: Secondary | ICD-10-CM

## 2019-06-10 DIAGNOSIS — Z6833 Body mass index (BMI) 33.0-33.9, adult: Secondary | ICD-10-CM | POA: Diagnosis not present

## 2019-06-10 DIAGNOSIS — R05 Cough: Secondary | ICD-10-CM | POA: Diagnosis not present

## 2019-06-10 MED ORDER — BENZONATATE 100 MG PO CAPS: 100.0000 mg | ORAL_CAPSULE | Freq: Two times a day (BID) | ORAL | 1 refills | Status: DC | PRN

## 2019-06-10 NOTE — Patient Instructions (Addendum)
F/U as before, call if you need me before  Decongestant perles are prescribed for cough, no symptoms suggestive of infection at this time so no antibiotic is prescribed  Please commit to daily use of your nose spray  Thanks for choosing [facility], we consider it a privelige to serve you.

## 2019-06-11 ENCOUNTER — Encounter: Payer: Self-pay | Admitting: Family Medicine

## 2019-06-11 NOTE — Assessment & Plan Note (Signed)
Hyperlipidemia:Low fat diet discussed and encouraged.   Lipid Panel  Lab Results  Component Value Date   CHOL 164 04/29/2019   HDL 45 04/29/2019   LDLCALC 98 04/29/2019   TRIG 110 04/29/2019   CHOLHDL 3.6 04/29/2019   Controlled, no change in medication

## 2019-06-11 NOTE — Progress Notes (Signed)
Virtual Visit via Telephone Note  I connected with Terry Love on 06/11/19 at  1:40 PM EST by telephone and verified that I am speaking with the correct person using two identifiers.  Location: Patient: home Provider:office   I discussed the limitations, risks, security and privacy concerns of performing an evaluation and management service by telephone and the availability of in person appointments. I also discussed with the patient that there may be a patient responsible charge related to this service. The patient expressed understanding and agreed to proceed.   History of Present Illness:   3 day h/o cough and chest congestion. No fever, chills ,sinus congestion, ear pain or sore throat, sputum is clear Denies chest pains, palpitations and leg swelling Denies abdominal pain, nausea, vomiting,diarrhea or constipation.   Denies dysuria, frequency, hesitancy or incontinence. C/o  joint pain, swelling and limitation in mobility. Denies headaches, seizures, numbness, or tingling. Reports improved depression and anxiety Denies skin break down or rash.     Observations/Objective: BP 124/69   Ht 5\' 7"  (1.702 m)   Wt 213 lb (96.6 kg)   BMI 33.36 kg/m  Good communication with no confusion and intact memory. Alert and oriented x 3 Intermittent cough during speech  Assessment and Plan: Cough Acute flare , responds to decongestant, tessalon perles prescribed  Depression, major, single episode, severe (HCC) Improved on current medication, managed at the [place]  Hyperlipidemia LDL goal <100 Hyperlipidemia:Low fat diet discussed and encouraged.   Lipid Panel  Lab Results  Component Value Date   CHOL 164 04/29/2019   HDL 45 04/29/2019   LDLCALC 98 04/29/2019   TRIG 110 04/29/2019   CHOLHDL 3.6 04/29/2019   Controlled, no change in medication     Obesity (BMI 30.0-34.9)  Patient re-educated about  the importance of commitment to a  minimum of 150 minutes of exercise per  week as able.  The importance of healthy food choices with portion control discussed, as well as eating regularly and within a 12 hour window most days. The need to choose "clean , green" food 50 to 75% of the time is discussed, as well as to make water the primary drink and set a goal of 64 ounces water daily.    Weight /BMI 06/10/2019 05/12/2019 04/29/2019  WEIGHT 213 lb 213 lb 9.6 oz 217 lb 0.6 oz  HEIGHT 5\' 7"  5\' 7"  5\' 10"   BMI 33.36 kg/m2 33.45 kg/m2 31.14 kg/m2        Follow Up Instructions:    I discussed the assessment and treatment plan with the patient. The patient was provided an opportunity to ask questions and all were answered. The patient agreed with the plan and demonstrated an understanding of the instructions.   The patient was advised to call back or seek an in-person evaluation if the symptoms worsen or if the condition fails to improve as anticipated.  I provided 10 minutes of non-face-to-face time during this encounter.   Tula Nakayama, MD

## 2019-06-11 NOTE — Assessment & Plan Note (Signed)
  Patient re-educated about  the importance of commitment to a  minimum of 150 minutes of exercise per week as able.  The importance of healthy food choices with portion control discussed, as well as eating regularly and within a 12 hour window most days. The need to choose "clean , green" food 50 to 75% of the time is discussed, as well as to make water the primary drink and set a goal of 64 ounces water daily.    Weight /BMI 06/10/2019 05/12/2019 04/29/2019  WEIGHT 213 lb 213 lb 9.6 oz 217 lb 0.6 oz  HEIGHT 5\' 7"  5\' 7"  5\' 10"   BMI 33.36 kg/m2 33.45 kg/m2 31.14 kg/m2

## 2019-06-11 NOTE — Assessment & Plan Note (Signed)
Improved on current medication, managed at the [place]

## 2019-06-11 NOTE — Assessment & Plan Note (Signed)
Acute flare , responds to decongestant, tessalon perles prescribed

## 2019-06-16 ENCOUNTER — Other Ambulatory Visit: Payer: Self-pay | Admitting: Family Medicine

## 2019-06-18 ENCOUNTER — Encounter: Payer: Self-pay | Admitting: Pulmonary Disease

## 2019-06-18 ENCOUNTER — Ambulatory Visit (INDEPENDENT_AMBULATORY_CARE_PROVIDER_SITE_OTHER): Payer: Medicare Other | Admitting: Pulmonary Disease

## 2019-06-18 ENCOUNTER — Other Ambulatory Visit: Payer: Self-pay

## 2019-06-18 VITALS — BP 118/70 | HR 65 | Temp 97.3°F | Ht 70.0 in | Wt 215.4 lb

## 2019-06-18 DIAGNOSIS — J4530 Mild persistent asthma, uncomplicated: Secondary | ICD-10-CM | POA: Diagnosis not present

## 2019-06-18 MED ORDER — BUDESONIDE-FORMOTEROL FUMARATE 160-4.5 MCG/ACT IN AERO: INHALATION_SPRAY | RESPIRATORY_TRACT | 5 refills | Status: DC

## 2019-06-18 MED ORDER — ALBUTEROL SULFATE HFA 108 (90 BASE) MCG/ACT IN AERS: INHALATION_SPRAY | RESPIRATORY_TRACT | 11 refills | Status: DC

## 2019-06-18 NOTE — Patient Instructions (Signed)
Bladder doing well with regard to her breathing Continue the Symbicort and albuterol as needed We will call in prescriptions Follow-up in 1 year.

## 2019-06-18 NOTE — Progress Notes (Signed)
Terry Love    CB:[id number]    1935-04-29  Primary Care Physician:Simpson, Norwood Levo, MD  Referring Physician: Fayrene Helper, MD [address] [place],  [place] [address]  Chief complaint:  Follow-up for mild persistent asthma  HPI: 84 year old with past medical history of insomnia, snoring, vitamin D deficiency, hyperlipidemia depression with anxiety. He was admitted in March 2018 with presumed COPD exacerbation treated with IV steroids, antibiotics and nebulizers. He was discharged on prednisone taper which he finished today and doxycycline. He is being maintained on albuterol inhaler but feels that his breathing has improved. He complained does not have any cough, dyspnea, shortness of breath, wheezing, sputum production, fevers, chills. He is lifelong nonsmoker but used to chew tobacco. He worked various jobs including the [employer] He does not report any exposures at work or at home. He has history of insomnia evaluated by a sleep study in February 2017. This was reportedly normal per the patient.  Interim history: Continues on Symbicort Is using his albuterol sparingly No new complaints.  Outpatient Encounter Medications as of 06/18/2019  Medication Sig  . aspirin (BAYER ASPIRIN EC LOW DOSE) 81 MG EC tablet Take 81 mg by mouth at bedtime.   Marland Kitchen atorvastatin (LIPITOR) 40 MG tablet TAKE 1 TABLET AT BEDTIME  . azelastine (ASTELIN) 0.1 % nasal spray Place 2 sprays into both nostrils 2 (two) times daily. Use in each nostril as directed  . benzonatate (TESSALON PERLES) 100 MG capsule Take 1 capsule (100 mg total) by mouth 2 (two) times daily as needed for cough.  . Brinzolamide-Brimonidine (SIMBRINZA) 1-0.2 % SUSP Place 1 drop into both eyes 2 (two) times daily.   . calcium-vitamin D (OSCAL WITH D) 500-200 MG-UNIT tablet Take 1 tablet by mouth 2 (two) times daily.  . dorzolamide (TRUSOPT) 2 % ophthalmic solution 1 drop 2 (two) times daily.  Marland Kitchen  latanoprost (XALATAN) 0.005 % ophthalmic solution Place 1 drop into both eyes at bedtime.  . Multiple Vitamin (MULTIVITAMIN WITH MINERALS) TABS tablet Take 1 tablet by mouth at bedtime.  . SYMBICORT 160-4.5 MCG/ACT inhaler TAKE 2 PUFFS BY MOUTH TWICE A DAY  . venlafaxine XR (EFFEXOR-XR) 75 MG 24 hr capsule TAKE 1 CAPSULE BY MOUTH DAILY WITH BREAKFAST  . VENTOLIN HFA 108 (90 Base) MCG/ACT inhaler INHALE 2 PUFFS INTO THE LUNGS EVERY 4 (FOUR) HOURS AS NEEDED FOR WHEEZING OR SHORTNESS OF BREATH  . vitamin C (ASCORBIC ACID) 500 MG tablet Take 500 mg by mouth at bedtime.   . Vitamin D, Ergocalciferol, (DRISDOL) 1.25 MG (50000 UT) CAPS capsule Take 1 capsule (50,000 Units total) by mouth every Monday. In the morning  . Wheat Dextrin (BENEFIBER DRINK MIX PO) Take 1 Dose by mouth daily as needed (for regularity). Use as directed daily    No facility-administered encounter medications on file as of 06/18/2019.   Physical Exam: Blood pressure 118/70, pulse 65, temperature (!) 97.3 F (36.3 C), temperature source Temporal, height 5\' 10"  (1.778 m), weight 215 lb 6.4 oz (97.7 kg), SpO2 98 %. Gen:      No acute distress HEENT:  EOMI, sclera anicteric Neck:     No masses; no thyromegaly Lungs:    Clear to auscultation bilaterally; normal respiratory effort CV:         Regular rate and rhythm; no murmurs Abd:      + bowel sounds; soft, non-tender; no palpable masses, no distension Ext:  No edema; adequate peripheral perfusion Skin:      Warm and dry; no rash Neuro: alert and oriented x 3 Psych: normal mood and affect  Data Reviewed: Chest x-ray 06/24/16-no acute cardiopulmonary abnormality. I reviewed the images personally.  Echo 06/23/15-  - Mild LVH with LVEF 60-65%. Grade 1 diastolic dysfunction with   increased LV filling pressure. Mild to moderate left atrial   enlargement. Mildly ectatic aortic root. Sclerotic aortic valve   without stenosis. Overall mild aortic regurgitation made up of 2    small jets. Upper normal right atrial chamber size. Trivial   tricuspid regurgitation, unable to assess PASP.  PFTs 09/05/16 FVC 2.90 (84%), FEV1 2.32 (92%), F/F 80, TLC 82%, DLCO 79% Minimal reduction in diffusion capacity.  FENO 09/05/16- 54  CBC with diff 09/05/16- WBC 8.2, 5.4% eos, absolute eso count 460 Blood allergy profile 09/05/16- negative RAST, IgE 150  Assessment:  Mild persistent asthma PFTs which did not show any obstruction. There is some suggestion of small airway disease as he has improvement in his mid flow rates. He does not have emphysema. Likely asthma given high FENO, eos and IgE with recurrent attacks of wheezing  Continue Symbicort, Nasonex as needed for sinus cancer  Plan/Recommendations: - Continue symbicort 160/4.5, albuterol PRN - Nasonex  Marshell Garfinkel MD [facility] 06/18/2019, 10:32 AM  CC: Fayrene Helper, MD

## 2019-07-07 ENCOUNTER — Ambulatory Visit (INDEPENDENT_AMBULATORY_CARE_PROVIDER_SITE_OTHER): Payer: Medicare Other | Admitting: *Deleted

## 2019-07-07 ENCOUNTER — Telehealth: Payer: Self-pay | Admitting: Internal Medicine

## 2019-07-07 DIAGNOSIS — I495 Sick sinus syndrome: Secondary | ICD-10-CM

## 2019-07-07 LAB — CUP PACEART REMOTE DEVICE CHECK
Date Time Interrogation Session: 20210329155548
Implantable Lead Implant Date: 20191216
Implantable Lead Implant Date: 20191216
Implantable Lead Location: 753859
Implantable Lead Location: 753860
Implantable Lead Model: 377
Implantable Lead Model: 377
Implantable Lead Serial Number: 80840322
Implantable Lead Serial Number: 80924555
Implantable Pulse Generator Implant Date: 20191216
Pulse Gen Model: 407145
Pulse Gen Serial Number: 69486427

## 2019-07-07 NOTE — Progress Notes (Signed)
PPM Remote  

## 2019-07-07 NOTE — Telephone Encounter (Signed)
I let the pt wife we will receive the transmission tonight. I told her the pt monitor is up to date. She asked me was it time for him to have an in person visit with Dr. Lovena Le. I told her I will asked the scheduler to give her a call.

## 2019-07-07 NOTE — Telephone Encounter (Signed)
  1. Has your device fired? No  2. Is you device beeping? No   3. Are you experiencing draining or swelling at device site? No  4. Are you calling to see if we received your device transmission? Yes   5. Have you passed out? No    Terry Love is calling to see if the device transmission sent in today 07/07/19 was received. Please advise.   Please route to [place]

## 2019-07-19 ENCOUNTER — Other Ambulatory Visit: Payer: Self-pay | Admitting: Family Medicine

## 2019-07-29 ENCOUNTER — Ambulatory Visit (INDEPENDENT_AMBULATORY_CARE_PROVIDER_SITE_OTHER): Payer: Medicare Other | Admitting: Internal Medicine

## 2019-07-29 ENCOUNTER — Encounter: Payer: Self-pay | Admitting: Internal Medicine

## 2019-07-29 ENCOUNTER — Other Ambulatory Visit: Payer: Self-pay

## 2019-07-29 VITALS — BP 116/72 | HR 68 | Temp 96.6°F | Ht 70.0 in | Wt 213.2 lb

## 2019-07-29 DIAGNOSIS — I495 Sick sinus syndrome: Secondary | ICD-10-CM | POA: Diagnosis not present

## 2019-07-29 DIAGNOSIS — Z950 Presence of cardiac pacemaker: Secondary | ICD-10-CM

## 2019-07-29 LAB — CUP PACEART INCLINIC DEVICE CHECK
Brady Statistic RA Percent Paced: 88 %
Brady Statistic RV Percent Paced: 1 %
Date Time Interrogation Session: 20210421103620
Implantable Lead Implant Date: 20191216
Implantable Lead Implant Date: 20191216
Implantable Lead Location: 753859
Implantable Lead Location: 753860
Implantable Lead Model: 377
Implantable Lead Model: 377
Implantable Lead Serial Number: 80840322
Implantable Lead Serial Number: 80924555
Implantable Pulse Generator Implant Date: 20191216
Lead Channel Impedance Value: 487 Ohm
Lead Channel Impedance Value: 585 Ohm
Lead Channel Pacing Threshold Amplitude: 1 V
Lead Channel Pacing Threshold Amplitude: 1.3 V
Lead Channel Pacing Threshold Pulse Width: 0.4 ms
Lead Channel Pacing Threshold Pulse Width: 0.4 ms
Lead Channel Sensing Intrinsic Amplitude: 1.9 mV
Lead Channel Sensing Intrinsic Amplitude: 7.8 mV
Lead Channel Setting Pacing Amplitude: 2 V
Lead Channel Setting Pacing Amplitude: 2.4 V
Lead Channel Setting Pacing Pulse Width: 0.4 ms
Pulse Gen Model: 407145
Pulse Gen Serial Number: 69486427

## 2019-07-29 NOTE — Progress Notes (Signed)
HPI Terry Love returns today for followup. He is a pleasant 84 yo man with a h/o HTN and COPD who was found to have chronotropic incompetence and underwent PPM insertion. In the interim, he has still had some dyspnea/fatigue with exertion. No PND or orthopnea. No edema. He admits to being sedentary. No Known Allergies   Current Outpatient Medications  Medication Sig Dispense Refill  . albuterol (VENTOLIN HFA) 108 (90 Base) MCG/ACT inhaler INHALE 2 PUFFS INTO THE LUNGS EVERY 4 (FOUR) HOURS AS NEEDED FOR WHEEZING OR SHORTNESS OF BREATH 18 g 11  . aspirin (BAYER ASPIRIN EC LOW DOSE) 81 MG EC tablet Take 81 mg by mouth at bedtime.     Marland Kitchen atorvastatin (LIPITOR) 40 MG tablet TAKE 1 TABLET AT BEDTIME 90 tablet 1  . azelastine (ASTELIN) 0.1 % nasal spray Place 2 sprays into both nostrils 2 (two) times daily. Use in each nostril as directed 30 mL 12  . benzonatate (TESSALON PERLES) 100 MG capsule Take 1 capsule (100 mg total) by mouth 2 (two) times daily as needed for cough. 30 capsule 1  . Brinzolamide-Brimonidine (SIMBRINZA) 1-0.2 % SUSP Place 1 drop into both eyes 2 (two) times daily.     . budesonide-formoterol (SYMBICORT) 160-4.5 MCG/ACT inhaler TAKE 2 PUFFS BY MOUTH TWICE A DAY 10.2 Inhaler 5  . calcium-vitamin D (OSCAL WITH D) 500-200 MG-UNIT tablet Take 1 tablet by mouth 2 (two) times daily. 150 tablet 1  . dorzolamide (TRUSOPT) 2 % ophthalmic solution 1 drop 2 (two) times daily.    Marland Kitchen latanoprost (XALATAN) 0.005 % ophthalmic solution Place 1 drop into both eyes at bedtime.    . Multiple Vitamin (MULTIVITAMIN WITH MINERALS) TABS tablet Take 1 tablet by mouth at bedtime.    Marland Kitchen venlafaxine XR (EFFEXOR-XR) 75 MG 24 hr capsule TAKE 1 CAPSULE BY MOUTH DAILY WITH BREAKFAST 90 capsule 2  . vitamin C (ASCORBIC ACID) 500 MG tablet Take 500 mg by mouth at bedtime.     . Vitamin D, Ergocalciferol, (DRISDOL) 1.25 MG (50000 UNIT) CAPS capsule TAKE 1 CAPSULE (50,000 UNITS TOTAL) BY MOUTH EVERY MONDAY.  IN THE MORNING 12 capsule 1  . Wheat Dextrin (BENEFIBER DRINK MIX PO) Take 1 Dose by mouth daily as needed (for regularity). Use as directed daily      No current facility-administered medications for this visit.     Past Medical History:  Diagnosis Date  . Anxiety   . Bradycardia    Chronic   . Colon polyps   . Depression   . Facial tic   . Hearing loss   . Hyperlipidemia   . 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 Obesity   . Seasonal allergies     ROS:   All systems reviewed and negative except as noted in the HPI.   Past Surgical History:  Procedure Laterality Date  . carpal tunnel  release right  2006  . COLONOSCOPY    . COLONOSCOPY  01/25/2011   Dr. Rehman:Examination performed to cecum Pan colonic diverticulosis/2 small polyps ablated via cold biopsy from transverse colon/External hemorrhoids, benign polyps  . COLONOSCOPY N/A 06/29/2013   Procedure: COLONOSCOPY;  Surgeon: Daneil Dolin, MD;  Location: [facility];  Service: Endoscopy;  Laterality: N/A;  10:45  . EYE SURGERY     left eye cataracts   . PACEMAKER IMPLANT N/A 03/24/2018   Procedure: PACEMAKER IMPLANT;  Surgeon: Evans Lance, MD;  Location: [facility];  Service: Cardiovascular;  Laterality: N/A;     Family History  Problem Relation Age of Onset  . Colon cancer Mother        diagnosed at age 70  . Aneurysm Father   . Aneurysm Brother   . Stroke Brother      Social History   Socioeconomic History  . Marital status: Married    Spouse name: Terry Love   . Number of children: 0  . Years of education: 11  . Highest education level: 12th grade  Occupational History  . Occupation: retired  Tobacco Use  . Smoking status: Never Smoker  . Smokeless tobacco: Former Systems developer    Types: Chew  Substance and Sexual Activity  . Alcohol use: No    Alcohol/week: 0.0 standard drinks  . Drug use: No  . Sexual activity: Not Currently  Other Topics Concern  . Not on file    Social History Narrative  . Not on file   Social Determinants of Health   Financial Resource Strain:   . Difficulty of Paying Living Expenses:   Food Insecurity:   . Worried About Charity fundraiser in the Last Year:   . Arboriculturist in the Last Year:   Transportation Needs:   . Film/video editor (Medical):   Marland Kitchen Lack of Transportation (Non-Medical):   Physical Activity:   . Days of Exercise per Week:   . Minutes of Exercise per Session:   Stress:   . Feeling of Stress :   Social Connections:   . Frequency of Communication with Friends and Family:   . Frequency of Social Gatherings with Friends and Family:   . Attends Religious Services:   . Active Member of Clubs or Organizations:   . Attends Archivist Meetings:   Marland Kitchen Marital Status:   Intimate Partner Violence:   . Fear of Current or Ex-Partner:   . Emotionally Abused:   Marland Kitchen Physically Abused:   . Sexually Abused:      BP 116/72   Pulse 68   Temp (!) 96.6 F (35.9 C)   Ht 5\' 10"  (1.778 m)   SpO2 98%   BMI 30.91 kg/m   Physical Exam:  Well appearing NAD HEENT: Unremarkable Neck:  No JVD, no thyromegally Lymphatics:  No adenopathy Back:  No CVA tenderness Lungs:  Clear with no wheezes HEART:  Regular rate rhythm, no murmurs, no rubs, no clicks Abd:  soft, positive bowel sounds, no organomegally, no rebound, no guarding Ext:  2 plus pulses, no edema, no cyanosis, no clubbing Skin:  No rashes no nodules Neuro:  CN II through XII intact, motor grossly intact  DEVICE  Normal device function.  See PaceArt for details.   Assess/Plan: 1. Chronotropic incompetence - He is asymptomatic,s/p PPM insertion. 2. PPM - his Biotronik CLS PPM is working normally. I considered making his CLS more aggressive but will hold off for now. 3. Obesity - I encouraged him to lose 10-15 lbs. 4. Dyspnea with exertion - He has some diastolic heart failure, and I encouraged him to avoid salty foods and start walking  daily.   Terry Love.D.

## 2019-07-29 NOTE — Patient Instructions (Signed)
Medication Instructions:  Your physician recommends that you continue on your current medications as directed. Please refer to the Current Medication list given to you today.  *If you need a refill on your cardiac medications before your next appointment, please call your pharmacy*   Lab Work: NONE   If you have labs (blood work) drawn today and your tests are completely normal, you will receive your results only by: . MyChart Message (if you have MyChart) OR . A paper copy in the mail If you have any lab test that is abnormal or we need to change your treatment, we will call you to review the results.   Testing/Procedures: NONE    Follow-Up: At [facility], you and your health needs are our priority.  As part of our continuing mission to provide you with exceptional heart care, we have created designated Provider Care Teams.  These Care Teams include your primary Cardiologist (physician) and Advanced Practice Providers (APPs -  Physician Assistants and Nurse Practitioners) who all work together to provide you with the care you need, when you need it.  We recommend signing up for the patient portal called "MyChart".  Sign up information is provided on this After Visit Summary.  MyChart is used to connect with patients for Virtual Visits (Telemedicine).  Patients are able to view lab/test results, encounter notes, upcoming appointments, etc.  Non-urgent messages can be sent to your provider as well.   To learn more about what you can do with MyChart, go to [email or web address].    Your next appointment:   1 year(s)  The format for your next appointment:   In Person  Provider:   Gregg Taylor, MD   Other Instructions Thank you for choosing [facility]!    

## 2019-10-06 ENCOUNTER — Ambulatory Visit (INDEPENDENT_AMBULATORY_CARE_PROVIDER_SITE_OTHER): Payer: Medicare Other | Admitting: *Deleted

## 2019-10-06 DIAGNOSIS — R001 Bradycardia, unspecified: Secondary | ICD-10-CM

## 2019-10-07 LAB — CUP PACEART REMOTE DEVICE CHECK
Date Time Interrogation Session: 20210630101558
Implantable Lead Implant Date: 20191216
Implantable Lead Implant Date: 20191216
Implantable Lead Location: 753859
Implantable Lead Location: 753860
Implantable Lead Model: 377
Implantable Lead Model: 377
Implantable Lead Serial Number: 80840322
Implantable Lead Serial Number: 80924555
Implantable Pulse Generator Implant Date: 20191216
Pulse Gen Model: 407145
Pulse Gen Serial Number: 69486427

## 2019-10-07 NOTE — Progress Notes (Signed)
Remote pacemaker transmission.   

## 2019-10-28 ENCOUNTER — Other Ambulatory Visit: Payer: Self-pay

## 2019-10-28 MED ORDER — ATORVASTATIN CALCIUM 40 MG PO TABS: 40.0000 mg | ORAL_TABLET | Freq: Every day | ORAL | 1 refills | Status: DC

## 2019-11-18 ENCOUNTER — Other Ambulatory Visit: Payer: Self-pay | Admitting: Family Medicine

## 2019-11-18 DIAGNOSIS — D2361 Other benign neoplasm of skin of right upper limb, including shoulder: Secondary | ICD-10-CM | POA: Diagnosis not present

## 2019-11-20 ENCOUNTER — Telehealth: Payer: Self-pay

## 2019-11-20 ENCOUNTER — Other Ambulatory Visit: Payer: Self-pay | Admitting: Family Medicine

## 2019-11-20 MED ORDER — BENZONATATE 100 MG PO CAPS
100.0000 mg | ORAL_CAPSULE | Freq: Two times a day (BID) | ORAL | 1 refills | Status: DC | PRN
Start: 2019-11-20 — End: 2019-11-26

## 2019-11-20 NOTE — Telephone Encounter (Signed)
Pt needing refill on benzonatae

## 2019-11-20 NOTE — Telephone Encounter (Signed)
Spoke with spouse. Aware that medication has been refilled

## 2019-11-20 NOTE — Telephone Encounter (Signed)
Sent fyi

## 2019-11-26 ENCOUNTER — Other Ambulatory Visit: Payer: Self-pay | Admitting: Family Medicine

## 2019-11-26 MED ORDER — BENZONATATE 100 MG PO CAPS
100.0000 mg | ORAL_CAPSULE | Freq: Two times a day (BID) | ORAL | 1 refills | Status: DC | PRN
Start: 2019-11-26 — End: 2020-04-04

## 2019-11-26 NOTE — Progress Notes (Signed)
tes

## 2019-12-01 ENCOUNTER — Encounter: Payer: Self-pay | Admitting: Family Medicine

## 2019-12-01 ENCOUNTER — Other Ambulatory Visit: Payer: Self-pay | Admitting: Pulmonary Disease

## 2019-12-01 ENCOUNTER — Other Ambulatory Visit: Payer: Self-pay

## 2019-12-01 ENCOUNTER — Ambulatory Visit (INDEPENDENT_AMBULATORY_CARE_PROVIDER_SITE_OTHER): Payer: Medicare Other | Admitting: Family Medicine

## 2019-12-01 VITALS — BP 123/78 | HR 70 | Resp 16 | Ht 70.0 in | Wt 215.1 lb

## 2019-12-01 DIAGNOSIS — E559 Vitamin D deficiency, unspecified: Secondary | ICD-10-CM | POA: Diagnosis not present

## 2019-12-01 DIAGNOSIS — Z23 Encounter for immunization: Secondary | ICD-10-CM | POA: Diagnosis not present

## 2019-12-01 DIAGNOSIS — E669 Obesity, unspecified: Secondary | ICD-10-CM

## 2019-12-01 DIAGNOSIS — J4530 Mild persistent asthma, uncomplicated: Secondary | ICD-10-CM

## 2019-12-01 DIAGNOSIS — E785 Hyperlipidemia, unspecified: Secondary | ICD-10-CM | POA: Diagnosis not present

## 2019-12-01 DIAGNOSIS — R062 Wheezing: Secondary | ICD-10-CM | POA: Diagnosis not present

## 2019-12-01 DIAGNOSIS — Z125 Encounter for screening for malignant neoplasm of prostate: Secondary | ICD-10-CM

## 2019-12-01 DIAGNOSIS — E66811 Obesity, class 1: Secondary | ICD-10-CM

## 2019-12-01 NOTE — Assessment & Plan Note (Signed)
Updated lab needed at/ before next visit.   

## 2019-12-01 NOTE — Assessment & Plan Note (Signed)
Hyperlipidemia:Low fat diet discussed and encouraged.   Lipid Panel  Lab Results  Component Value Date   CHOL 164 04/29/2019   HDL 45 04/29/2019   LDLCALC 98 04/29/2019   TRIG 110 04/29/2019   CHOLHDL 3.6 04/29/2019   Updated lab needed at/ before next visit.

## 2019-12-01 NOTE — Assessment & Plan Note (Signed)
  Patient re-educated about  the importance of commitment to a  minimum of 150 minutes of exercise per week as able.  The importance of healthy food choices with portion control discussed, as well as eating regularly and within a 12 hour window most days. The need to choose "clean , green" food 50 to 75% of the time is discussed, as well as to make water the primary drink and set a goal of 64 ounces water daily.    Weight /BMI 12/01/2019 07/29/2019 06/18/2019  WEIGHT 215 lb 1.9 oz 213 lb 3.2 oz 215 lb 6.4 oz  HEIGHT 5\' 10"  5\' 10"  5\' 10"   BMI 30.87 kg/m2 30.59 kg/m2 30.91 kg/m2

## 2019-12-01 NOTE — Assessment & Plan Note (Signed)
Controlled, no change in medication  

## 2019-12-01 NOTE — Progress Notes (Signed)
   Terry Love     MRN: [id number]      DOB: 04-19-1935   HPI Terry Love is here for follow up and re-evaluation of chronic medical conditions, medication management and review of any available recent lab and radiology data.  Preventive health is updated, specifically  Cancer screening and Immunization.   Questions or concerns regarding consultations or procedures which the PT has had in the interim are  addressed. The PT denies any adverse reactions to current medications since the last visit.  There are no new concerns.States stress and depression are increasing and out of control because of declining health of his sister in law and the drain it is taking on them both ROS Denies recent fever or chills. Denies sinus pressure, nasal congestion, ear pain or sore throat. Denies chest congestion, productive cough or wheezing. Denies chest pains, palpitations and leg swelling Denies abdominal pain, nausea, vomiting,diarrhea or constipation.   Denies dysuria, frequency, hesitancy or incontinence. Denies joint pain, swelling and limitation in mobility. Denies headaches, seizures, numbness, or tingling. Denies skin break down or rash.   PE  BP 123/78   Pulse 70   Resp 16   Ht 5\' 10"  (1.778 m)   Wt 215 lb 1.9 oz (97.6 kg)   SpO2 95%   BMI 30.87 kg/m   Patient alert and oriented and in no cardiopulmonary distress.  HEENT: No facial asymmetry, EOMI,     Neck supple .  Chest: Clear to auscultation bilaterally.  CVS: S1, S2 no murmurs, no S3.Regular rate.  ABD: Soft non tender.   Ext: No edema  MS: Adequate ROM spine, shoulders, hips and knees.  Skin: Intact, no ulcerations or rash noted.  Psych: Good eye contact, normal affect. Memory intact not anxious or depressed appearing.  CNS: CN 2-12 intact, power,  normal throughout.no focal deficits noted.   Assessment & Plan  Hyperlipidemia LDL goal <100 Hyperlipidemia:Low fat diet discussed and encouraged.   Lipid Panel    Lab Results  Component Value Date   CHOL 164 04/29/2019   HDL 45 04/29/2019   LDLCALC 98 04/29/2019   TRIG 110 04/29/2019   CHOLHDL 3.6 04/29/2019   Updated lab needed at/ before next visit.     Obesity (BMI 30.0-34.9)  Patient re-educated about  the importance of commitment to a  minimum of 150 minutes of exercise per week as able.  The importance of healthy food choices with portion control discussed, as well as eating regularly and within a 12 hour window most days. The need to choose "clean , green" food 50 to 75% of the time is discussed, as well as to make water the primary drink and set a goal of 64 ounces water daily.    Weight /BMI 12/01/2019 07/29/2019 06/18/2019  WEIGHT 215 lb 1.9 oz 213 lb 3.2 oz 215 lb 6.4 oz  HEIGHT 5\' 10"  5\' 10"  5\' 10"   BMI 30.87 kg/m2 30.59 kg/m2 30.91 kg/m2      Wheezing Controlled, no change in medication   Vitamin D deficiency Updated lab needed at/ before next visit.

## 2019-12-01 NOTE — Patient Instructions (Signed)
F/U in office with MD in January, call if you need me before  Flu vaccine in office today  Lipid, cmp and EGFr and PSA fasting   It is important that you exercise regularly at least 30 minutes 5 times a week. If you develop chest pain, have severe difficulty breathing, or feel very tired, stop exercising immediately and seek medical attention   Think about what you will eat, plan ahead. Choose " clean, green, fresh or frozen" over canned, processed or packaged foods which are more sugary, salty and fatty. 70 to 75% of food eaten should be vegetables and fruit. Three meals at set times with snacks allowed between meals, but they must be fruit or vegetables. Aim to eat over a 12 hour period , example 7 am to 7 pm, and STOP after  your last meal of the day. Drink water,generally about 64 ounces per day, no other drink is as healthy. Fruit juice is best enjoyed in a healthy way, by EATING the fruit.  Thanks for choosing [facility], we consider it a privelige to serve you.

## 2019-12-02 LAB — COMPLETE METABOLIC PANEL WITH GFR
AG Ratio: 1.5 (calc) (ref 1.0–2.5)
ALT: 15 U/L (ref 9–46)
AST: 23 U/L (ref 10–35)
Albumin: 4.3 g/dL (ref 3.6–5.1)
Alkaline phosphatase (APISO): 49 U/L (ref 35–144)
BUN: 16 mg/dL (ref 7–25)
CO2: 27 mmol/L (ref 20–32)
Calcium: 9.8 mg/dL (ref 8.6–10.3)
Chloride: 105 mmol/L (ref 98–110)
Creat: 0.74 mg/dL (ref 0.70–1.11)
GFR, Est African American: 100 mL/min/{1.73_m2} (ref >=60)
GFR, Est Non African American: 86 mL/min/{1.73_m2} (ref >=60)
Globulin: 2.9 g/dL (calc) (ref 1.9–3.7)
Glucose, Bld: 94 mg/dL (ref 65–99)
Potassium: 4.6 mmol/L (ref 3.5–5.3)
Sodium: 140 mmol/L (ref 135–146)
Total Bilirubin: 0.6 mg/dL (ref 0.2–1.2)
Total Protein: 7.2 g/dL (ref 6.1–8.1)

## 2019-12-02 LAB — LIPID PANEL
Cholesterol: 145 mg/dL (ref <200)
HDL: 51 mg/dL (ref >=40)
LDL Cholesterol (Calc): 80 mg/dL (calc)
Non-HDL Cholesterol (Calc): 94 mg/dL (calc) (ref <130)
Total CHOL/HDL Ratio: 2.8 (calc) (ref <5.0)
Triglycerides: 65 mg/dL (ref <150)

## 2019-12-02 LAB — PSA: PSA: <0.1 ng/mL (ref <=4.0)

## 2019-12-29 ENCOUNTER — Other Ambulatory Visit: Payer: Self-pay | Admitting: Family Medicine

## 2020-01-03 ENCOUNTER — Other Ambulatory Visit: Payer: Self-pay | Admitting: Family Medicine

## 2020-01-06 ENCOUNTER — Ambulatory Visit (INDEPENDENT_AMBULATORY_CARE_PROVIDER_SITE_OTHER): Payer: Medicare Other | Admitting: Emergency Medicine

## 2020-01-06 DIAGNOSIS — R001 Bradycardia, unspecified: Secondary | ICD-10-CM | POA: Diagnosis not present

## 2020-01-07 LAB — CUP PACEART REMOTE DEVICE CHECK
Battery Remaining Percentage: 85 %
Brady Statistic RA Percent Paced: 89 %
Brady Statistic RV Percent Paced: 2 %
Date Time Interrogation Session: 20210929071522
Implantable Lead Implant Date: 20191216
Implantable Lead Implant Date: 20191216
Implantable Lead Location: 753859
Implantable Lead Location: 753860
Implantable Lead Model: 377
Implantable Lead Model: 377
Implantable Lead Serial Number: 80840322
Implantable Lead Serial Number: 80924555
Implantable Pulse Generator Implant Date: 20191216
Lead Channel Impedance Value: 449 Ohm
Lead Channel Impedance Value: 585 Ohm
Lead Channel Pacing Threshold Amplitude: 1.3 V
Lead Channel Pacing Threshold Amplitude: 1.5 V
Lead Channel Pacing Threshold Pulse Width: 0.4 ms
Lead Channel Pacing Threshold Pulse Width: 0.4 ms
Lead Channel Sensing Intrinsic Amplitude: 2.7 mV
Lead Channel Sensing Intrinsic Amplitude: 7.6 mV
Lead Channel Setting Pacing Amplitude: 2 V
Lead Channel Setting Pacing Amplitude: 2.4 V
Lead Channel Setting Pacing Pulse Width: 0.4 ms
Pulse Gen Model: 407145
Pulse Gen Serial Number: 69486427

## 2020-01-08 NOTE — Progress Notes (Signed)
Remote pacemaker transmission.   

## 2020-01-31 ENCOUNTER — Other Ambulatory Visit: Payer: Self-pay | Admitting: Family Medicine

## 2020-02-01 DIAGNOSIS — Z23 Encounter for immunization: Secondary | ICD-10-CM | POA: Diagnosis not present

## 2020-02-02 ENCOUNTER — Other Ambulatory Visit: Payer: Self-pay

## 2020-02-02 ENCOUNTER — Ambulatory Visit (INDEPENDENT_AMBULATORY_CARE_PROVIDER_SITE_OTHER): Payer: Medicare Other

## 2020-02-02 VITALS — BP 123/78 | HR 70 | Resp 16 | Ht 70.0 in | Wt 215.0 lb

## 2020-02-02 DIAGNOSIS — Z0000 Encounter for general adult medical examination without abnormal findings: Secondary | ICD-10-CM | POA: Diagnosis not present

## 2020-02-02 NOTE — Patient Instructions (Addendum)
Terry Love , Thank you for taking time to come for your Medicare Wellness Visit. I appreciate your ongoing commitment to your health goals. Please review the following plan we discussed and let me know if I can assist you in the future.   Screening recommendations/referrals: Colonoscopy: DUE 06/30/2023  Recommended yearly ophthalmology/optometry visit for glaucoma screening and checkup Recommended yearly dental visit for hygiene and checkup  Vaccinations: Influenza vaccine: up to date  Pneumococcal vaccine: Complete  Tdap vaccine: up to date; DUE 02/21/2024 Shingles vaccine: Complete     Advanced directives: Pt verbalizes he has a living will.   Conditions/risks identified: None   Next appointment: 04/20/2020 @ 8:00am with Dr. Moshe Cipro   Preventive Care 82 Years and Older, Male Preventive care refers to lifestyle choices and visits with your health care provider that can promote health and wellness. What does preventive care include?  A yearly physical exam. This is also called an annual well check.  Dental exams once or twice a year.  Routine eye exams. Ask your health care provider how often you should have your eyes checked.  Personal lifestyle choices, including:  Daily care of your teeth and gums.  Regular physical activity.  Eating a healthy diet.  Avoiding tobacco and drug use.  Limiting alcohol use.  Practicing safe sex.  Taking low doses of aspirin every day.  Taking vitamin and mineral supplements as recommended by your health care provider. What happens during an annual well check? The services and screenings done by your health care provider during your annual well check will depend on your age, overall health, lifestyle risk factors, and family history of disease. Counseling  Your health care provider may ask you questions about your:  Alcohol use.  Tobacco use.  Drug use.  Emotional well-being.  Home and relationship well-being.  Sexual  activity.  Eating habits.  History of falls.  Memory and ability to understand (cognition).  Work and work Statistician. Screening  You may have the following tests or measurements:  Height, weight, and BMI.  Blood pressure.  Lipid and cholesterol levels. These may be checked every 5 years, or more frequently if you are over 63 years old.  Skin check.  Lung cancer screening. You may have this screening every year starting at age 82 if you have a 30-pack-year history of smoking and currently smoke or have quit within the past 15 years.  Fecal occult blood test (FOBT) of the stool. You may have this test every year starting at age 82.  Flexible sigmoidoscopy or colonoscopy. You may have a sigmoidoscopy every 5 years or a colonoscopy every 10 years starting at age 82.  Prostate cancer screening. Recommendations will vary depending on your family history and other risks.  Hepatitis C blood test.  Hepatitis B blood test.  Sexually transmitted disease (STD) testing.  Diabetes screening. This is done by checking your blood sugar (glucose) after you have not eaten for a while (fasting). You may have this done every 1-3 years.  Abdominal aortic aneurysm (AAA) screening. You may need this if you are a current or former smoker.  Osteoporosis. You may be screened starting at age 35 if you are at high risk. Talk with your health care provider about your test results, treatment options, and if necessary, the need for more tests. Vaccines  Your health care provider may recommend certain vaccines, such as:  Influenza vaccine. This is recommended every year.  Tetanus, diphtheria, and acellular pertussis (Tdap, Td) vaccine. You may  need a Td booster every 10 years.  Zoster vaccine. You may need this after age 70.  Pneumococcal 13-valent conjugate (PCV13) vaccine. One dose is recommended after age 60.  Pneumococcal polysaccharide (PPSV23) vaccine. One dose is recommended after age  56. Talk to your health care provider about which screenings and vaccines you need and how often you need them. This information is not intended to replace advice given to you by your health care provider. Make sure you discuss any questions you have with your health care provider. Document Released: 04/22/2015 Document Revised: 12/14/2015 Document Reviewed: 01/25/2015 Elsevier Interactive Patient Education  2017 [place] Prevention in the Home Falls can cause injuries. They can happen to people of all ages. There are many things you can do to make your home safe and to help prevent falls. What can I do on the outside of my home?  Regularly fix the edges of walkways and driveways and fix any cracks.  Remove anything that might make you trip as you walk through a door, such as a raised step or threshold.  Trim any bushes or trees on the path to your home.  Use bright outdoor lighting.  Clear any walking paths of anything that might make someone trip, such as rocks or tools.  Regularly check to see if handrails are loose or broken. Make sure that both sides of any steps have handrails.  Any raised decks and porches should have guardrails on the edges.  Have any leaves, snow, or ice cleared regularly.  Use sand or salt on walking paths during winter.  Clean up any spills in your garage right away. This includes oil or grease spills. What can I do in the bathroom?  Use night lights.  Install grab bars by the toilet and in the tub and shower. Do not use towel bars as grab bars.  Use non-skid mats or decals in the tub or shower.  If you need to sit down in the shower, use a plastic, non-slip stool.  Keep the floor dry. Clean up any water that spills on the floor as soon as it happens.  Remove soap buildup in the tub or shower regularly.  Attach bath mats securely with double-sided non-slip rug tape.  Do not have throw rugs and other things on the floor that can make  you trip. What can I do in the bedroom?  Use night lights.  Make sure that you have a light by your bed that is easy to reach.  Do not use any sheets or blankets that are too big for your bed. They should not hang down onto the floor.  Have a firm chair that has side arms. You can use this for support while you get dressed.  Do not have throw rugs and other things on the floor that can make you trip. What can I do in the kitchen?  Clean up any spills right away.  Avoid walking on wet floors.  Keep items that you use a lot in easy-to-reach places.  If you need to reach something above you, use a strong step stool that has a grab bar.  Keep electrical cords out of the way.  Do not use floor polish or wax that makes floors slippery. If you must use wax, use non-skid floor wax.  Do not have throw rugs and other things on the floor that can make you trip. What can I do with my stairs?  Do not leave any items on  the stairs.  Make sure that there are handrails on both sides of the stairs and use them. Fix handrails that are broken or loose. Make sure that handrails are as long as the stairways.  Check any carpeting to make sure that it is firmly attached to the stairs. Fix any carpet that is loose or worn.  Avoid having throw rugs at the top or bottom of the stairs. If you do have throw rugs, attach them to the floor with carpet tape.  Make sure that you have a light switch at the top of the stairs and the bottom of the stairs. If you do not have them, ask someone to add them for you. What else can I do to help prevent falls?  Wear shoes that:  Do not have high heels.  Have rubber bottoms.  Are comfortable and fit you well.  Are closed at the toe. Do not wear sandals.  If you use a stepladder:  Make sure that it is fully opened. Do not climb a closed stepladder.  Make sure that both sides of the stepladder are locked into place.  Ask someone to hold it for you, if  possible.  Clearly mark and make sure that you can see:  Any grab bars or handrails.  First and last steps.  Where the edge of each step is.  Use tools that help you move around (mobility aids) if they are needed. These include:  Canes.  Walkers.  Scooters.  Crutches.  Turn on the lights when you go into a dark area. Replace any light bulbs as soon as they burn out.  Set up your furniture so you have a clear path. Avoid moving your furniture around.  If any of your floors are uneven, fix them.  If there are any pets around you, be aware of where they are.  Review your medicines with your doctor. Some medicines can make you feel dizzy. This can increase your chance of falling. Ask your doctor what other things that you can do to help prevent falls. This information is not intended to replace advice given to you by your health care provider. Make sure you discuss any questions you have with your health care provider. Document Released: 01/20/2009 Document Revised: 09/01/2015 Document Reviewed: 04/30/2014 Elsevier Interactive Patient Education  2017 [employer].

## 2020-02-02 NOTE — Progress Notes (Signed)
Subjective:   Terry Love is a 84 y.o. male who presents for Medicare Annual/Subsequent preventive examination.        Objective:    There were no vitals filed for this visit. There is no height or weight on file to calculate BMI.  Advanced Directives 03/24/2018 03/04/2018 03/04/2018 01/27/2018 08/27/2016 06/24/2016 06/24/2016  Does Patient Have a Medical Advance Directive? No No No Yes Yes No No  Type of Advance Directive - - - Living will;Healthcare Power of [place];Living will - -  Does patient want to make changes to medical advance directive? - - - No - Patient declined - - -  Copy of [place] in Chart? - - - Yes No - copy requested - -  Would patient like information on creating a medical advance directive? No - Patient declined No - Patient declined - - - No - Patient declined -  Pre-existing out of facility DNR order (yellow form or pink MOST form) - - - - - - -    Current Medications (verified) Outpatient Encounter Medications as of 02/02/2020  Medication Sig  . albuterol (VENTOLIN HFA) 108 (90 Base) MCG/ACT inhaler INHALE 2 PUFFS INTO THE LUNGS EVERY 4 (FOUR) HOURS AS NEEDED FOR WHEEZING OR SHORTNESS OF BREATH  . aspirin (BAYER ASPIRIN EC LOW DOSE) 81 MG EC tablet Take 81 mg by mouth at bedtime.   Marland Kitchen atorvastatin (LIPITOR) 40 MG tablet TAKE 1 TABLET AT BEDTIME  . azelastine (ASTELIN) 0.1 % nasal spray Place 2 sprays into both nostrils 2 (two) times daily. Use in each nostril as directed  . benzonatate (TESSALON) 100 MG capsule Take 1 capsule (100 mg total) by mouth 2 (two) times daily as needed for cough.  . Brinzolamide-Brimonidine (SIMBRINZA) 1-0.2 % SUSP Place 1 drop into both eyes 2 (two) times daily.   . budesonide-formoterol (SYMBICORT) 160-4.5 MCG/ACT inhaler TAKE 2 PUFFS BY MOUTH TWICE A DAY  . calcium-vitamin D (OSCAL WITH D) 500-200 MG-UNIT tablet Take 1 tablet by mouth 2 (two) times daily.  . dorzolamide  (TRUSOPT) 2 % ophthalmic solution 1 drop 2 (two) times daily.  Marland Kitchen gabapentin (NEURONTIN) 100 MG capsule Take 100 mg by mouth 3 (three) times daily.  Marland Kitchen latanoprost (XALATAN) 0.005 % ophthalmic solution Place 1 drop into both eyes at bedtime.  . Multiple Vitamin (MULTIVITAMIN WITH MINERALS) TABS tablet Take 1 tablet by mouth at bedtime.  Marland Kitchen venlafaxine XR (EFFEXOR-XR) 75 MG 24 hr capsule TAKE 1 CAPSULE BY MOUTH DAILY WITH BREAKFAST  . vitamin C (ASCORBIC ACID) 500 MG tablet Take 500 mg by mouth at bedtime.   . Vitamin D, Ergocalciferol, (DRISDOL) 1.25 MG (50000 UNIT) CAPS capsule TAKE 1 CAPSULE (50,000 UNITS TOTAL) BY MOUTH EVERY MONDAY. IN THE MORNING  . Wheat Dextrin (BENEFIBER DRINK MIX PO) Take 1 Dose by mouth daily as needed (for regularity). Use as directed daily    No facility-administered encounter medications on file as of 02/02/2020.    Allergies (verified) Patient has no known allergies.   History: Past Medical History:  Diagnosis Date  . Anxiety   . Bradycardia    Chronic   . Colon polyps   . Depression   . Facial tic   . Hearing loss   . Hyperlipidemia   . 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 Obesity   . Seasonal allergies    Past Surgical History:  Procedure Laterality Date  . carpal tunnel  release  right  2006  . COLONOSCOPY    . COLONOSCOPY  01/25/2011   Dr. Rehman:Examination performed to cecum Pan colonic diverticulosis/2 small polyps ablated via cold biopsy from transverse colon/External hemorrhoids, benign polyps  . COLONOSCOPY N/A 06/29/2013   Procedure: COLONOSCOPY;  Surgeon: Daneil Dolin, MD;  Location[facility];  Service: Endoscopy;  Laterality: N/A;  10:45  . EYE SURGERY     left eye cataracts   . PACEMAKER IMPLANT N/A 03/24/2018   Procedure: PACEMAKER IMPLANT;  Surgeon: Evans Lance, MD;  Location: [facility];  Service: Cardiovascular;  Laterality: N/A;   Family History  Problem Relation Age of Onset  . Colon  cancer Mother        diagnosed at age 35  . Aneurysm Father   . Aneurysm Brother   . Stroke Brother    Social History   Socioeconomic History  . Marital status: Married    Spouse name: Terry Love   . Number of children: 0  . Years of education: 18  . Highest education level: 12th grade  Occupational History  . Occupation: retired  Tobacco Use  . Smoking status: Never Smoker  . Smokeless tobacco: Former Systems developer    Types: Secondary school teacher  . Vaping Use: Never used  Substance and Sexual Activity  . Alcohol use: No    Alcohol/week: 0.0 standard drinks  . Drug use: No  . Sexual activity: Not Currently  Other Topics Concern  . Not on file  Social History Narrative  . Not on file   Social Determinants of Health   Financial Resource Strain:   . Difficulty of Paying Living Expenses: Not on file  Food Insecurity:   . Worried About Charity fundraiser in the Last Year: Not on file  . Ran Out of Food in the Last Year: Not on file  Transportation Needs:   . Lack of Transportation (Medical): Not on file  . Lack of Transportation (Non-Medical): Not on file  Physical Activity:   . Days of Exercise per Week: Not on file  . Minutes of Exercise per Session: Not on file  Stress:   . Feeling of Stress : Not on file  Social Connections:   . Frequency of Communication with Friends and Family: Not on file  . Frequency of Social Gatherings with Friends and Family: Not on file  . Attends Religious Services: Not on file  . Active Member of Clubs or Organizations: Not on file  . Attends Archivist Meetings: Not on file  . Marital Status: Not on file    Tobacco Counseling Counseling given: Not Answered              Diabetic? No          Activities of Daily Living No flowsheet data found.  Patient Care Team: Fayrene Helper, MD as PCP - General Bronson Ing Lenise Herald, MD (Inactive) as PCP - Cardiology (Cardiology) Gala Romney Cristopher Estimable, MD as Consulting Physician  (Gastroenterology)  Indicate any recent Medical Services you may have received from other than [facility] providers in the past year (date may be approximate).     Assessment:   This is a routine wellness examination for Rolette.  Hearing/Vision screen No exam data present  Dietary issues and exercise activities discussed:    Goals    . DIET - EAT MORE FRUITS AND VEGETABLES    . DIET - REDUCE SUGAR INTAKE    . Exercise 3x per week (  30 min per time)     Recommend starting a routine exercise program at least 3 days a week for 30-45 minutes at a time as tolerated.        Depression Screen PHQ 2/9 Scores 12/01/2019 04/29/2019 01/30/2019 12/30/2018 06/03/2018 03/18/2018 03/04/2018  PHQ - 2 Score 1 3 2 5 3  0 3  PHQ- 9 Score - 6 11 18 9  0 15    Fall Risk Fall Risk  12/01/2019 04/29/2019 01/30/2019 12/30/2018 08/13/2018  Falls in the past year? 0 0 0 0 0  Number falls in past yr: - 0 0 0 0  Injury with Fall? - 0 0 0 0  Risk for fall due to : - - - - -    Any stairs in or around the home? No  If so, are there any without handrails? No  Home free of loose throw rugs in walkways, pet beds, electrical cords, etc? Yes  Adequate lighting in your home to reduce risk of falls? Yes   ASSISTIVE DEVICES UTILIZED TO PREVENT FALLS:  Life alert? No  Use of a cane, walker or w/c? Yes  Grab bars in the bathroom? Yes  Shower chair or bench in shower? No  Elevated toilet seat or a handicapped toilet? Yes   TIMED UP AND GO:  Was the test performed? No .    Cognitive Function:     6CIT Screen 01/30/2019 01/27/2018 08/27/2016  What Year? 0 points 0 points 0 points  What month? 0 points 0 points 0 points  What time? 0 points 0 points 0 points  Count back from 20 0 points 0 points 0 points  Months in reverse 0 points 0 points 0 points  Repeat phrase 0 points 0 points 0 points  Total Score 0 0 0    Immunizations Immunization History  Administered Date(s) Administered  . Fluad Quad(high Dose 65+)  11/27/2018  . Influenza Split 01/01/2013, 01/01/2014, 01/03/2018  . Influenza Whole 01/06/2007, 01/04/2009, 12/23/2009, 12/14/2010  . Influenza, High Dose Seasonal PF 01/03/2018  . Influenza,inj,Quad PF,6+ Mos 12/14/2015, 12/03/2016, 01/28/2017, 12/01/2019  . Moderna SARS-COVID-2 Vaccination 05/06/2019, 06/03/2019  . Pneumococcal Conjugate-13 10/13/2013  . Pneumococcal Polysaccharide-23 02/21/2004  . Td 12/23/2009  . Tdap 02/20/2014  . Zoster 01/06/2007  . Zoster Recombinat (Shingrix) 01/10/2017, 03/11/2017    TDAP status: Up to date Flu Vaccine status: Up to date Pneumococcal vaccine status: Up to date Covid-19 vaccine status: Completed vaccines  Qualifies for Shingles Vaccine? Yes   Zostavax completed Yes   Shingrix Completed?: Yes  Screening Tests Health Maintenance  Topic Date Due  . TETANUS/TDAP  02/21/2024  . INFLUENZA VACCINE  Completed  . COVID-19 Vaccine  Completed  . PNA vac Low Risk Adult  Completed    Health Maintenance  There are no preventive care reminders to display for this patient.  Colorectal cancer screening: Completed normal. Repeat every 10 years  Lung Cancer Screening: (Low Dose CT Chest recommended if Age 54-80 years, 30 pack-year currently smoking OR have quit w/in 15years.) does not qualify.    Additional Screening:  Hepatitis C Screening: does qualify; Completed   Vision Screening: Recommended annual ophthalmology exams for early detection of glaucoma and other disorders of the eye.  Is the patient up to date with their annual eye exam?  Yes    Who is the provider or what is the name of the office in which the patient attends annual eye exams? Dr. Yong Channel at the [facility] in [place]  If pt  is not established with a provider, would they like to be referred to a provider to establish care? No .   Dental Screening: Recommended annual dental exams for proper oral hygiene  Community Resource Referral / Chronic Care Management: CRR required this  visit?  No   CCM required this visit?  No      Plan:     I have personally reviewed and noted the following in the patient's chart:   . Medical and social history . Use of alcohol, tobacco or illicit drugs  . Current medications and supplements . Functional ability and status . Nutritional status . Physical activity . Advanced directives . List of other physicians . Hospitalizations, surgeries, and ER visits in previous 12 months . Vitals . Screenings to include cognitive, depression, and falls . Referrals and appointments  In addition, I have reviewed and discussed with patient certain preventive protocols, quality metrics, and best practice recommendations. A written personalized care plan for preventive services as well as general preventive health recommendations were provided to patient.     Lonn Georgia, LPN   [id number]   Nurse Notes: AWV conducted over the phone with pt consent of the televisit, and wife assisting for verbal clarity. Provider present in the office today, and after visit summary printed to be mailed with any upcoming health maintenance dates. Pt verbalizes understanding of today's annual wellness visit, and this call took about 30 min to complete.

## 2020-02-17 ENCOUNTER — Encounter: Payer: Self-pay | Admitting: Internal Medicine

## 2020-02-17 ENCOUNTER — Ambulatory Visit (INDEPENDENT_AMBULATORY_CARE_PROVIDER_SITE_OTHER): Payer: Medicare Other | Admitting: Internal Medicine

## 2020-02-17 ENCOUNTER — Other Ambulatory Visit: Payer: Self-pay

## 2020-02-17 VITALS — BP 116/73 | HR 78 | Temp 98.7°F | Resp 16 | Ht 70.0 in | Wt 202.0 lb

## 2020-02-17 DIAGNOSIS — F322 Major depressive disorder, single episode, severe without psychotic features: Secondary | ICD-10-CM | POA: Diagnosis not present

## 2020-02-17 DIAGNOSIS — J069 Acute upper respiratory infection, unspecified: Secondary | ICD-10-CM

## 2020-02-17 DIAGNOSIS — J4530 Mild persistent asthma, uncomplicated: Secondary | ICD-10-CM

## 2020-02-17 NOTE — Patient Instructions (Addendum)
Please continue to use over-the-counter medication to help with cough.  Okay to use Robitussin for dry cough. Tylenol as needed for fever/muscle pain.   Upper Respiratory Infection, Adult An upper respiratory infection (URI) affects the nose, throat, and upper air passages. URIs are caused by germs (viruses). The most common type of URI is often called "the common cold." Medicines cannot cure URIs, but you can do things at home to relieve your symptoms. URIs usually get better within 7-10 days. Follow these instructions at home: Activity  Rest as needed.  If you have a fever, stay home from work or school until your fever is gone, or until your doctor says you may return to work or school. ? You should stay home until you cannot spread the infection anymore (you are not contagious). ? Your doctor may have you wear a face mask so you have less risk of spreading the infection. Relieving symptoms  Gargle with a salt-water mixture 3-4 times a day or as needed. To make a salt-water mixture, completely dissolve -1 tsp of salt in 1 cup of warm water.  Use a cool-mist humidifier to add moisture to the air. This can help you breathe more easily. Eating and drinking   Drink enough fluid to keep your pee (urine) pale yellow.  Eat soups and other clear broths. General instructions   Take over-the-counter and prescription medicines only as told by your doctor. These include cold medicines, fever reducers, and cough suppressants.  Do not use any products that contain nicotine or tobacco. These include cigarettes and e-cigarettes. If you need help quitting, ask your doctor.  Avoid being where people are smoking (avoid secondhand smoke).  Make sure you get regular shots and get the flu shot every year.  Keep all follow-up visits as told by your doctor. This is important. How to avoid spreading infection to others   Wash your hands often with soap and water. If you do not have soap and  water, use hand sanitizer.  Avoid touching your mouth, face, eyes, or nose.  Cough or sneeze into a tissue or your sleeve or elbow. Do not cough or sneeze into your hand or into the air. Contact a doctor if:  You are getting worse, not better.  You have any of these: ? A fever. ? Chills. ? Brown or red mucus in your nose. ? Yellow or brown fluid (discharge)coming from your nose. ? Pain in your face, especially when you bend forward. ? Swollen neck glands. ? Pain with swallowing. ? White areas in the back of your throat. Get help right away if:  You have shortness of breath that gets worse.  You have very bad or constant: ? Headache. ? Ear pain. ? Pain in your forehead, behind your eyes, and over your cheekbones (sinus pain). ? Chest pain.  You have long-lasting (chronic) lung disease along with any of these: ? Wheezing. ? Long-lasting cough. ? Coughing up blood. ? A change in your usual mucus.  You have a stiff neck.  You have changes in your: ? Vision. ? Hearing. ? Thinking. ? Mood. Summary  An upper respiratory infection (URI) is caused by a germ called a virus. The most common type of URI is often called "the common cold."  URIs usually get better within 7-10 days.  Take over-the-counter and prescription medicines only as told by your doctor. This information is not intended to replace advice given to you by your health care provider. Make sure you discuss  any questions you have with your health care provider. Document Revised: 04/03/2018 Document Reviewed: 11/16/2016 Elsevier Patient Education  [place].

## 2020-02-17 NOTE — Progress Notes (Signed)
Established Patient Office Visit  Subjective:  Patient ID: Terry Love, male    DOB: 1935-12-03  Age: 84 y.o. MRN: [id number]  CC:  Chief Complaint  Patient presents with  . Cough    x 3 days    HPI Terry Love is an 84 year old male with past medical history of asthma, HLD, depression and chronic neck pain presents for evaluation of cough and nasal congestion for last 3 days.  Upper Respiratory Infection: Patient complains of symptoms of a URI. Symptoms include congestion and cough. Onset of symptoms was 3 days ago, unchanged since that time. He also c/o runny nose, but denies fever, chills, sore throat, change in taste or smell sensation. Treatment to date: antihistamines and decongestants. Of note, he has a h/o asthma and c/o mild dyspnea since the onset of the URI. He uses Albuterol inhaler for dyspnea/wheezing.  Patient has also been feeling depressed for last few months. He has been taking Risperidone for resistant depression and continues to take Venlafaxine. He denies any suicidal ideation. He follows up with [facility]. Patient is advised to engage in activities of liking and to comply with treatment.     Past Medical History:  Diagnosis Date  . Anxiety   . Bradycardia    Chronic   . Colon polyps   . Depression   . Facial tic   . Hearing loss   . Hyperlipidemia   . 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 Obesity   . Seasonal allergies     Past Surgical History:  Procedure Laterality Date  . carpal tunnel  release right  2006  . COLONOSCOPY    . COLONOSCOPY  01/25/2011   Dr. Rehman:Examination performed to cecum Pan colonic diverticulosis/2 small polyps ablated via cold biopsy from transverse colon/External hemorrhoids, benign polyps  . COLONOSCOPY N/A 06/29/2013   Procedure: COLONOSCOPY;  Surgeon: Daneil Dolin, MD;  [facility];  Service: Endoscopy;  Laterality: N/A;  10:45  . EYE SURGERY     left eye cataracts   .  PACEMAKER IMPLANT N/A 03/24/2018   Procedure: PACEMAKER IMPLANT;  Surgeon: Evans Lance, MD;  Location: [facility];  Service: Cardiovascular;  Laterality: N/A;    Family History  Problem Relation Age of Onset  . Colon cancer Mother        diagnosed at age 83  . Aneurysm Father   . Aneurysm Brother   . Stroke Brother     Social History   Socioeconomic History  . Marital status: Married    Spouse name: Ceasar Lund   . Number of children: 0  . Years of education: 42  . Highest education level: 12th grade  Occupational History  . Occupation: retired  Tobacco Use  . Smoking status: Never Smoker  . Smokeless tobacco: Former Systems developer    Types: Secondary school teacher  . Vaping Use: Never used  Substance and Sexual Activity  . Alcohol use: No    Alcohol/week: 0.0 standard drinks  . Drug use: No  . Sexual activity: Not Currently  Other Topics Concern  . Not on file  Social History Narrative  . Not on file   Social Determinants of Health   Financial Resource Strain: Low Risk   . Difficulty of Paying Living Expenses: Not hard at all  Food Insecurity: No Food Insecurity  . Worried About Charity fundraiser in the Last Year: Never true  . Ran Out of Food in  the Last Year: Never true  Transportation Needs: No Transportation Needs  . Lack of Transportation (Medical): No  . Lack of Transportation (Non-Medical): No  Physical Activity: Inactive  . Days of Exercise per Week: 0 days  . Minutes of Exercise per Session: 0 min  Stress: Stress Concern Present  . Feeling of Stress : To some extent  Social Connections: Moderately Integrated  . Frequency of Communication with Friends and Family: Three times a week  . Frequency of Social Gatherings with Friends and Family: Once a week  . Attends Religious Services: More than 4 times per year  . Active Member of Clubs or Organizations: No  . Attends Archivist Meetings: Never  . Marital Status: Married  Human resources officer Violence:  Not At Risk  . Fear of Current or Ex-Partner: No  . Emotionally Abused: No  . Physically Abused: No  . Sexually Abused: No    Outpatient Medications Prior to Visit  Medication Sig Dispense Refill  . albuterol (VENTOLIN HFA) 108 (90 Base) MCG/ACT inhaler INHALE 2 PUFFS INTO THE LUNGS EVERY 4 (FOUR) HOURS AS NEEDED FOR WHEEZING OR SHORTNESS OF BREATH 18 g 11  . aspirin (BAYER ASPIRIN EC LOW DOSE) 81 MG EC tablet Take 81 mg by mouth at bedtime.     Marland Kitchen atorvastatin (LIPITOR) 40 MG tablet TAKE 1 TABLET AT BEDTIME 90 tablet 1  . azelastine (ASTELIN) 0.1 % nasal spray Place 2 sprays into both nostrils 2 (two) times daily. Use in each nostril as directed 30 mL 12  . benzonatate (TESSALON) 100 MG capsule Take 1 capsule (100 mg total) by mouth 2 (two) times daily as needed for cough. 20 capsule 1  . Brinzolamide-Brimonidine (SIMBRINZA) 1-0.2 % SUSP Place 1 drop into both eyes 2 (two) times daily.     . budesonide-formoterol (SYMBICORT) 160-4.5 MCG/ACT inhaler TAKE 2 PUFFS BY MOUTH TWICE A DAY 1 each 5  . calcium-vitamin D (OSCAL WITH D) 500-200 MG-UNIT tablet Take 1 tablet by mouth 2 (two) times daily. 150 tablet 1  . dorzolamide (TRUSOPT) 2 % ophthalmic solution 1 drop 2 (two) times daily.    Marland Kitchen gabapentin (NEURONTIN) 100 MG capsule Take 100 mg by mouth 3 (three) times daily.    Marland Kitchen latanoprost (XALATAN) 0.005 % ophthalmic solution Place 1 drop into both eyes at bedtime.    . Multiple Vitamin (MULTIVITAMIN WITH MINERALS) TABS tablet Take 1 tablet by mouth at bedtime.    . risperiDONE (RISPERDAL) 0.25 MG tablet Take 0.25 mg by mouth 2 (two) times daily.    Marland Kitchen venlafaxine XR (EFFEXOR-XR) 75 MG 24 hr capsule TAKE 1 CAPSULE BY MOUTH DAILY WITH BREAKFAST 90 capsule 2  . vitamin C (ASCORBIC ACID) 500 MG tablet Take 500 mg by mouth at bedtime.     . Vitamin D, Ergocalciferol, (DRISDOL) 1.25 MG (50000 UNIT) CAPS capsule TAKE 1 CAPSULE (50,000 UNITS TOTAL) BY MOUTH EVERY MONDAY. IN THE MORNING 12 capsule 1  .  Wheat Dextrin (BENEFIBER DRINK MIX PO) Take 1 Dose by mouth daily as needed (for regularity). Use as directed daily      No facility-administered medications prior to visit.    No Known Allergies  ROS Review of Systems  Constitutional: Negative for chills and fever.  HENT: Positive for congestion, postnasal drip and rhinorrhea. Negative for ear pain, sinus pain and sore throat.   Eyes: Negative for pain and discharge.  Respiratory: Positive for cough. Negative for shortness of breath.   Cardiovascular: Negative for chest  pain and palpitations.  Gastrointestinal: Negative for constipation, diarrhea, nausea and vomiting.  Endocrine: Negative for polydipsia and polyuria.  Genitourinary: Negative for dysuria and hematuria.  Musculoskeletal: Positive for neck pain. Negative for neck stiffness.  Skin: Negative for rash.  Neurological: Negative for dizziness, weakness, numbness and headaches.  Psychiatric/Behavioral: Negative for agitation and behavioral problems.      Objective:    Physical Exam Vitals reviewed.  Constitutional:      General: He is not in acute distress.    Appearance: He is not diaphoretic.  HENT:     Head: Normocephalic and atraumatic.     Nose: Congestion present.     Mouth/Throat:     Mouth: Mucous membranes are moist.  Eyes:     General: No scleral icterus.    Extraocular Movements: Extraocular movements intact.     Pupils: Pupils are equal, round, and reactive to light.  Cardiovascular:     Rate and Rhythm: Normal rate and regular rhythm.     Heart sounds: Normal heart sounds. No murmur heard.   Pulmonary:     Breath sounds: Normal breath sounds. No wheezing or rales.  Abdominal:     Palpations: Abdomen is soft.     Tenderness: There is no abdominal tenderness.  Musculoskeletal:     Cervical back: Neck supple. No tenderness.     Right lower leg: No edema.     Left lower leg: No edema.  Skin:    General: Skin is warm.     Findings: No rash.    Neurological:     General: No focal deficit present.     Mental Status: He is alert and oriented to person, place, and time.     Cranial Nerves: No cranial nerve deficit.     Sensory: No sensory deficit.  Psychiatric:        Mood and Affect: Mood normal.        Behavior: Behavior normal.     BP 116/73   Pulse 78   Temp 98.7 F (37.1 C)   Resp 16   Ht 5\' 10"  (1.778 m)   Wt 202 lb (91.6 kg)   SpO2 98%   BMI 28.98 kg/m  Wt Readings from Last 3 Encounters:  02/17/20 202 lb (91.6 kg)  02/02/20 215 lb (97.5 kg)  12/01/19 215 lb 1.9 oz (97.6 kg)     There are no preventive care reminders to display for this patient.  There are no preventive care reminders to display for this patient.  Lab Results  Component Value Date   TSH 1.12 04/29/2019   Lab Results  Component Value Date   WBC 6.5 05/12/2019   HGB 13.7 05/12/2019   HCT 40.9 05/12/2019   MCV 90.5 05/12/2019   PLT 313 05/12/2019   Lab Results  Component Value Date   NA 140 12/01/2019   K 4.6 12/01/2019   CO2 27 12/01/2019   GLUCOSE 94 12/01/2019   BUN 16 12/01/2019   CREATININE 0.74 12/01/2019   BILITOT 0.6 12/01/2019   ALKPHOS 47 07/30/2018   AST 23 12/01/2019   ALT 15 12/01/2019   PROT 7.2 12/01/2019   ALBUMIN 4.2 07/30/2018   CALCIUM 9.8 12/01/2019   ANIONGAP 7 07/30/2018   Lab Results  Component Value Date   CHOL 145 12/01/2019   Lab Results  Component Value Date   HDL 51 12/01/2019   Lab Results  Component Value Date   LDLCALC 80 12/01/2019   Lab Results  Component  Value Date   TRIG 65 12/01/2019   Lab Results  Component Value Date   CHOLHDL 2.8 12/01/2019   Lab Results  Component Value Date   HGBA1C 5.3 07/31/2018      Assessment & Plan:   Problem List Items Addressed This Visit      URTI (acute upper respiratory infection)    -  Primary Advised to continue to use OTC medication for cough- dextromethorphan with Guiafenecin Tylenol PRN for fever/myalgias Albuterol PRN for  dyspnea/wheezing Hand hygiene Wear mask outdoors and public places  Respiratory   Mild persistent asthma On Symbicort and Albuterol (PRN)      Other   Depression, major, single episode, severe (HCC) On Venlafaxine Recently added Risperidone for resistant depression Follows up with [facility]    No orders of the defined types were placed in this encounter.   Follow-up: Return if symptoms worsen or fail to improve.    Lindell Spar, MD

## 2020-03-07 ENCOUNTER — Other Ambulatory Visit: Payer: Self-pay | Admitting: Family Medicine

## 2020-03-08 ENCOUNTER — Encounter: Payer: Self-pay | Admitting: Urology

## 2020-03-08 ENCOUNTER — Ambulatory Visit (INDEPENDENT_AMBULATORY_CARE_PROVIDER_SITE_OTHER): Payer: Medicare Other | Admitting: Urology

## 2020-03-08 ENCOUNTER — Other Ambulatory Visit: Payer: Self-pay

## 2020-03-08 VITALS — BP 113/72 | HR 71 | Temp 98.6°F | Ht 70.0 in | Wt 202.0 lb

## 2020-03-08 DIAGNOSIS — R351 Nocturia: Secondary | ICD-10-CM | POA: Diagnosis not present

## 2020-03-08 DIAGNOSIS — R3912 Poor urinary stream: Secondary | ICD-10-CM | POA: Diagnosis not present

## 2020-03-08 DIAGNOSIS — N138 Other obstructive and reflux uropathy: Secondary | ICD-10-CM | POA: Diagnosis not present

## 2020-03-08 DIAGNOSIS — N401 Enlarged prostate with lower urinary tract symptoms: Secondary | ICD-10-CM | POA: Diagnosis not present

## 2020-03-08 LAB — URINALYSIS, ROUTINE W REFLEX MICROSCOPIC
Bilirubin, UA: NEGATIVE
Glucose, UA: NEGATIVE
Ketones, UA: NEGATIVE
Leukocytes,UA: NEGATIVE
Nitrite, UA: NEGATIVE
Protein,UA: NEGATIVE
RBC, UA: NEGATIVE
Specific Gravity, UA: 1.02 (ref 1.005–1.030)
Urobilinogen, Ur: 1 mg/dL (ref 0.2–1.0)
pH, UA: 7 (ref 5.0–7.5)

## 2020-03-08 LAB — BLADDER SCAN AMB NON-IMAGING: Scan Result: 0

## 2020-03-08 MED ORDER — TAMSULOSIN HCL 0.4 MG PO CAPS: 0.4000 mg | ORAL_CAPSULE | Freq: Every day | ORAL | 11 refills | Status: DC

## 2020-03-08 NOTE — Patient Instructions (Signed)

## 2020-03-08 NOTE — Progress Notes (Signed)
Urological Symptom Review  Patient is experiencing the following symptoms: Hard to postpone urination Get up at night to urinate Stream starts and stops Trouble starting stream Weak stream   Review of Systems  Gastrointestinal (upper)  : Negative for upper GI symptoms  Gastrointestinal (lower) : Negative for lower GI symptoms  Constitutional : Fatigue  Skin: Skin rash/lesion Itching  Eyes: Negative for eye symptoms  Ear/Nose/Throat : Sinus problems  Hematologic/Lymphatic: Negative for Hematologic/Lymphatic symptoms  Cardiovascular : Leg swelling  Respiratory : Cough Shortness of breath  Endocrine: Negative for endocrine symptoms  Musculoskeletal: Joint pain  Neurological: Negative for neurological symptoms  Psychologic: Depression Anxiety

## 2020-03-08 NOTE — Progress Notes (Signed)
03/08/2020 2:14 PM   Terry Love July 22, 1935 [id number]  Referring provider: Fayrene Helper, MD [address], [address] [place],  [place] [address]  Weak stream  HPI: Terry Love is a 84yo here for evaluation of a weak urinary stream and nocturia. For over 1 year he has noticed a weaker stream, urinary hesitancy, starting/stopping, dribbling, and nocturia 1x. He has urinary urgency with occasional urge incontinence. No dysuria or hematuria. PVR 0cc.    PMH: Past Medical History:  Diagnosis Date  . Anxiety   . Asthma   . Bradycardia    Chronic   . Colon polyps   . Depression   . Facial tic   . Glaucoma   . Hearing loss   . Heart disease   . High cholesterol   . Hyperlipidemia   . 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 Obesity   . Seasonal allergies     Surgical History: Past Surgical History:  Procedure Laterality Date  . carpal tunnel  release right  2006  . COLONOSCOPY    . COLONOSCOPY  01/25/2011   Dr. Rehman:Examination performed to cecum Pan colonic diverticulosis/2 small polyps ablated via cold biopsy from transverse colon/External hemorrhoids, benign polyps  . COLONOSCOPY N/A 06/29/2013   Procedure: COLONOSCOPY;  Surgeon: Daneil Dolin, MD;  [facility];  Service: Endoscopy;  Laterality: N/A;  10:45  . EYE SURGERY     left eye cataracts   . PACEMAKER IMPLANT N/A 03/24/2018   Procedure: PACEMAKER IMPLANT;  Surgeon: Evans Lance, MD;  Location: [place];  Service: Cardiovascular;  Laterality: N/A;    Home Medications:  Allergies as of 03/08/2020   No Known Allergies     Medication List       Accurate as of March 08, 2020  2:14 PM. If you have any questions, ask your nurse or doctor.        albuterol 108 (90 Base) MCG/ACT inhaler Commonly known as: Ventolin HFA INHALE 2 PUFFS INTO THE LUNGS EVERY 4 (FOUR) HOURS AS NEEDED FOR WHEEZING OR SHORTNESS OF BREATH   atorvastatin 40 MG tablet Commonly  known as: LIPITOR TAKE 1 TABLET AT BEDTIME   azelastine 0.1 % nasal spray Commonly known as: ASTELIN Place 2 sprays into both nostrils 2 (two) times daily. Use in each nostril as directed   Bayer Aspirin EC Low Dose 81 MG EC tablet Generic drug: aspirin Take 81 mg by mouth at bedtime.   BENEFIBER DRINK MIX PO Take 1 Dose by mouth daily as needed (for regularity). Use as directed daily   benzonatate 100 MG capsule Commonly known as: TESSALON Take 1 capsule (100 mg total) by mouth 2 (two) times daily as needed for cough.   budesonide-formoterol 160-4.5 MCG/ACT inhaler Commonly known as: Symbicort TAKE 2 PUFFS BY MOUTH TWICE A DAY   calcium-vitamin D 500-200 MG-UNIT tablet Commonly known as: OSCAL WITH D Take 1 tablet by mouth 2 (two) times daily.   dorzolamide 2 % ophthalmic solution Commonly known as: TRUSOPT 1 drop 2 (two) times daily.   gabapentin 100 MG capsule Commonly known as: NEURONTIN TAKE 1 CAPSULE BY MOUTH THREE TIMES A DAY   latanoprost 0.005 % ophthalmic solution Commonly known as: XALATAN Place 1 drop into both eyes at bedtime.   multivitamin with minerals Tabs tablet Take 1 tablet by mouth at bedtime.   risperiDONE 0.25 MG tablet Commonly known as: RISPERDAL Take 0.25 mg by mouth 2 (two)  times daily.   Simbrinza 1-0.2 % Susp Generic drug: Brinzolamide-Brimonidine Place 1 drop into both eyes 2 (two) times daily.   venlafaxine XR 75 MG 24 hr capsule Commonly known as: EFFEXOR-XR TAKE 1 CAPSULE BY MOUTH DAILY WITH BREAKFAST   vitamin C 500 MG tablet Commonly known as: ASCORBIC ACID Take 500 mg by mouth at bedtime.   Vitamin D (Ergocalciferol) 1.25 MG (50000 UNIT) Caps capsule Commonly known as: DRISDOL TAKE 1 CAPSULE (50,000 UNITS TOTAL) BY MOUTH EVERY MONDAY. IN THE MORNING       Allergies: No Known Allergies  Family History: Family History  Problem Relation Age of Onset  . Colon cancer Mother        diagnosed at age 20  . Aneurysm  Father   . Aneurysm Brother   . Stroke Brother     Social History:  reports that he has never smoked. He quit smokeless tobacco use about 3 years ago.  His smokeless tobacco use included chew. He reports that he does not drink alcohol and does not use drugs.  ROS: All other review of systems were reviewed and are negative except what is noted above in HPI  Physical Exam: BP 113/72   Pulse 71   Temp 98.6 F (37 C)   Ht 5\' 10"  (1.778 m)   Wt 202 lb (91.6 kg)   BMI 28.98 kg/m   Constitutional:  Alert and oriented, No acute distress. HEENT: [place] AT, moist mucus membranes.  Trachea midline, no masses. Cardiovascular: No clubbing, cyanosis, or edema. Respiratory: Normal respiratory effort, no increased work of breathing. GI: Abdomen is soft, nontender, nondistended, no abdominal masses GU: No CVA tenderness. Circumcised phallus. No masses/lesions on penis, testis, scrotum. Prostate 30g smooth no nodules no induration.  Lymph: No cervical or inguinal lymphadenopathy. Skin: No rashes, bruises or suspicious lesions. Neurologic: Grossly intact, no focal deficits, moving all 4 extremities. Psychiatric: Normal mood and affect.  Laboratory Data: Lab Results  Component Value Date   WBC 6.5 05/12/2019   HGB 13.7 05/12/2019   HCT 40.9 05/12/2019   MCV 90.5 05/12/2019   PLT 313 05/12/2019    Lab Results  Component Value Date   CREATININE 0.74 12/01/2019    Lab Results  Component Value Date   PSA <0.1 12/01/2019   PSA 0.2 09/08/2018   PSA 0.1 09/03/2017    No results found for: TESTOSTERONE  Lab Results  Component Value Date   HGBA1C 5.3 07/31/2018    Urinalysis    Component Value Date/Time   BILIRUBINUR negative 11/24/2012 1350   PROTEINUR negative 11/24/2012 1350   UROBILINOGEN 0.2 11/24/2012 1350   NITRITE negative 11/24/2012 1350   LEUKOCYTESUR Negative 11/24/2012 1350    No results found for: LABMICR, [place], RBCUA, LABEPIT, MUCUS, BACTERIA  Pertinent  Imaging:  No results found for this or any previous visit.  No results found for this or any previous visit.  No results found for this or any previous visit.  No results found for this or any previous visit.  No results found for this or any previous visit.  No results found for this or any previous visit.  No results found for this or any previous visit.  No results found for this or any previous visit.   Assessment & Plan:    1. Nocturia -We will trial flomax 0.4mg  daily - BLADDER SCAN AMB NON-IMAGING - Urinalysis, Routine w reflex microscopic  2. Benign prostatic hyperplasia with urinary obstruction -Flomax 0.4mg  daily  3. Weak  urinary stream Flomax 0.4mg  daily. RTC 6 weeks    No follow-ups on file.  Nicolette Bang, MD  [facility] [place]

## 2020-04-04 ENCOUNTER — Other Ambulatory Visit: Payer: Self-pay | Admitting: Family Medicine

## 2020-04-06 ENCOUNTER — Ambulatory Visit (INDEPENDENT_AMBULATORY_CARE_PROVIDER_SITE_OTHER): Payer: Medicare Other

## 2020-04-06 DIAGNOSIS — I495 Sick sinus syndrome: Secondary | ICD-10-CM

## 2020-04-06 LAB — CUP PACEART REMOTE DEVICE CHECK
Date Time Interrogation Session: 20211229081325
Implantable Lead Implant Date: 20191216
Implantable Lead Implant Date: 20191216
Implantable Lead Location: 753859
Implantable Lead Location: 753860
Implantable Lead Model: 377
Implantable Lead Model: 377
Implantable Lead Serial Number: 80840322
Implantable Lead Serial Number: 80924555
Implantable Pulse Generator Implant Date: 20191216
Pulse Gen Model: 407145
Pulse Gen Serial Number: 69486427

## 2020-04-19 ENCOUNTER — Encounter: Payer: Self-pay | Admitting: Urology

## 2020-04-19 ENCOUNTER — Ambulatory Visit (INDEPENDENT_AMBULATORY_CARE_PROVIDER_SITE_OTHER): Payer: Medicare Other | Admitting: Urology

## 2020-04-19 ENCOUNTER — Other Ambulatory Visit: Payer: Self-pay

## 2020-04-19 VITALS — BP 99/65 | HR 69 | Temp 98.9°F | Wt 213.0 lb

## 2020-04-19 DIAGNOSIS — R351 Nocturia: Secondary | ICD-10-CM

## 2020-04-19 DIAGNOSIS — N401 Enlarged prostate with lower urinary tract symptoms: Secondary | ICD-10-CM | POA: Diagnosis not present

## 2020-04-19 DIAGNOSIS — R3912 Poor urinary stream: Secondary | ICD-10-CM | POA: Diagnosis not present

## 2020-04-19 DIAGNOSIS — N138 Other obstructive and reflux uropathy: Secondary | ICD-10-CM

## 2020-04-19 LAB — URINALYSIS, ROUTINE W REFLEX MICROSCOPIC
Bilirubin, UA: NEGATIVE
Glucose, UA: NEGATIVE
Ketones, UA: NEGATIVE
Leukocytes,UA: NEGATIVE
Nitrite, UA: NEGATIVE
Protein,UA: NEGATIVE
RBC, UA: NEGATIVE
Specific Gravity, UA: 1.02 (ref 1.005–1.030)
Urobilinogen, Ur: 0.2 mg/dL (ref 0.2–1.0)
pH, UA: 5.5 (ref 5.0–7.5)

## 2020-04-19 LAB — BLADDER SCAN AMB NON-IMAGING: Scan Result: 0

## 2020-04-19 MED ORDER — TAMSULOSIN HCL 0.4 MG PO CAPS: 0.4000 mg | ORAL_CAPSULE | Freq: Every day | ORAL | 3 refills | Status: DC

## 2020-04-19 NOTE — Patient Instructions (Signed)

## 2020-04-19 NOTE — Progress Notes (Signed)
04/19/2020 1:58 PM   Terry Love 01/31/1936 [id number]  Referring provider: Fayrene Helper, MD [address], [place],  [place] [address]  followup BPH  HPI: Terry Love is a 85yo here for followup for BPH with weak stream and nocturia. We started flomax 0.4mg  daily last visit. He notes improvement in his urinary stream. Urinary frequency decreased to every 4 hours. Urinary urgency resolved. Hesitancy resolved. Nocturia decreased to 0-1x. No other LUTS. Overall he is very happy with his urination   PMH: Past Medical History:  Diagnosis Date  . Anxiety   . Asthma   . Bradycardia    Chronic   . Colon polyps   . Depression   . Facial tic   . Glaucoma   . Hearing loss   . Heart disease   . High cholesterol   . Hyperlipidemia   . 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 Obesity   . Seasonal allergies     Surgical History: Past Surgical History:  Procedure Laterality Date  . carpal tunnel  release right  2006  . COLONOSCOPY    . COLONOSCOPY  01/25/2011   Dr. Rehman:Examination performed to cecum Pan colonic diverticulosis/2 small polyps ablated via cold biopsy from transverse colon/External hemorrhoids, benign polyps  . COLONOSCOPY N/A 06/29/2013   Procedure: COLONOSCOPY;  Surgeon: Daneil Dolin, MD;  [facility];  Service: Endoscopy;  Laterality: N/A;  10:45  . EYE SURGERY     left eye cataracts   . PACEMAKER IMPLANT N/A 03/24/2018   Procedure: PACEMAKER IMPLANT;  Surgeon: Evans Lance, MD;  Location: [facility];  Service: Cardiovascular;  Laterality: N/A;    Home Medications:  Allergies as of 04/19/2020   No Known Allergies     Medication List       Accurate as of April 19, 2020  1:58 PM. If you have any questions, ask your nurse or doctor.        albuterol 108 (90 Base) MCG/ACT inhaler Commonly known as: Ventolin HFA INHALE 2 PUFFS INTO THE LUNGS EVERY 4 (FOUR) HOURS AS NEEDED FOR WHEEZING OR  SHORTNESS OF BREATH   aspirin 81 MG EC tablet Take 81 mg by mouth at bedtime.   atorvastatin 40 MG tablet Commonly known as: LIPITOR TAKE 1 TABLET AT BEDTIME   azelastine 0.1 % nasal spray Commonly known as: ASTELIN Place 2 sprays into both nostrils 2 (two) times daily. Use in each nostril as directed   BENEFIBER DRINK MIX PO Take 1 Dose by mouth daily as needed (for regularity). Use as directed daily   benzonatate 100 MG capsule Commonly known as: TESSALON TAKE 1 CAPSULE (100 MG TOTAL) BY MOUTH 2 (TWO) TIMES DAILY AS NEEDED FOR COUGH.   Brinzolamide-Brimonidine 1-0.2 % Susp Place 1 drop into both eyes 2 (two) times daily.   budesonide-formoterol 160-4.5 MCG/ACT inhaler Commonly known as: Symbicort TAKE 2 PUFFS BY MOUTH TWICE A DAY   calcium-vitamin D 500-200 MG-UNIT tablet Commonly known as: OSCAL WITH D Take 1 tablet by mouth 2 (two) times daily.   dorzolamide 2 % ophthalmic solution Commonly known as: TRUSOPT 1 drop 2 (two) times daily.   gabapentin 100 MG capsule Commonly known as: NEURONTIN TAKE 1 CAPSULE BY MOUTH THREE TIMES A DAY   latanoprost 0.005 % ophthalmic solution Commonly known as: XALATAN Place 1 drop into both eyes at bedtime.   multivitamin with minerals Tabs tablet Take 1 tablet by mouth at  bedtime.   risperiDONE 0.25 MG tablet Commonly known as: RISPERDAL Take 0.25 mg by mouth 2 (two) times daily.   tamsulosin 0.4 MG Caps capsule Commonly known as: FLOMAX Take 1 capsule (0.4 mg total) by mouth daily.   venlafaxine XR 75 MG 24 hr capsule Commonly known as: EFFEXOR-XR TAKE 1 CAPSULE BY MOUTH DAILY WITH BREAKFAST   vitamin C 500 MG tablet Commonly known as: ASCORBIC ACID Take 500 mg by mouth at bedtime.   Vitamin D (Ergocalciferol) 1.25 MG (50000 UNIT) Caps capsule Commonly known as: DRISDOL TAKE 1 CAPSULE (50,000 UNITS TOTAL) BY MOUTH EVERY MONDAY. IN THE MORNING       Allergies: No Known Allergies  Family History: Family  History  Problem Relation Age of Onset  . Colon cancer Mother        diagnosed at age 25  . Aneurysm Father   . Aneurysm Brother   . Stroke Brother     Social History:  reports that he has never smoked. He quit smokeless tobacco use about 4 years ago.  His smokeless tobacco use included chew. He reports that he does not drink alcohol and does not use drugs.  ROS: All other review of systems were reviewed and are negative except what is noted above in HPI  Physical Exam: BP 99/65   Pulse 69   Temp 98.9 F (37.2 C)   Wt 213 lb (96.6 kg)   BMI 30.56 kg/m   Constitutional:  Alert and oriented, No acute distress. HEENT: [place] AT, moist mucus membranes.  Trachea midline, no masses. Cardiovascular: No clubbing, cyanosis, or edema. Respiratory: Normal respiratory effort, no increased work of breathing. GI: Abdomen is soft, nontender, nondistended, no abdominal masses GU: No CVA tenderness.  Lymph: No cervical or inguinal lymphadenopathy. Skin: No rashes, bruises or suspicious lesions. Neurologic: Grossly intact, no focal deficits, moving all 4 extremities. Psychiatric: Normal mood and affect.  Laboratory Data: Lab Results  Component Value Date   WBC 6.5 05/12/2019   HGB 13.7 05/12/2019   HCT 40.9 05/12/2019   MCV 90.5 05/12/2019   PLT 313 05/12/2019    Lab Results  Component Value Date   CREATININE 0.74 12/01/2019    Lab Results  Component Value Date   PSA <0.1 12/01/2019   PSA 0.2 09/08/2018   PSA 0.1 09/03/2017    No results found for: TESTOSTERONE  Lab Results  Component Value Date   HGBA1C 5.3 07/31/2018    Urinalysis    Component Value Date/Time   APPEARANCEUR Clear 04/19/2020 1318   GLUCOSEU Negative 04/19/2020 1318   BILIRUBINUR Negative 04/19/2020 1318   PROTEINUR Negative 04/19/2020 1318   UROBILINOGEN 0.2 11/24/2012 1350   NITRITE Negative 04/19/2020 1318   LEUKOCYTESUR Negative 04/19/2020 1318    Lab Results  Component Value Date   LABMICR  Comment 04/19/2020    Pertinent Imaging:  No results found for this or any previous visit.  No results found for this or any previous visit.  No results found for this or any previous visit.  No results found for this or any previous visit.  No results found for this or any previous visit.  No results found for this or any previous visit.  No results found for this or any previous visit.  No results found for this or any previous visit.   Assessment & Plan:    1. Benign prostatic hyperplasia with urinary obstruction -Continue flomax 0.4mg  daily - Urinalysis, Routine w reflex microscopic - Bladder Scan (Post  Void Residual) in office   No follow-ups on file.  Nicolette Bang, MD  [facility] [place]

## 2020-04-19 NOTE — Progress Notes (Signed)
Remote pacemaker transmission.   

## 2020-04-19 NOTE — Progress Notes (Signed)
Urological Symptom Review  Patient is experiencing the following symptoms: Hard to postpone urination Stream starts and stops Trouble starting stream   Review of Systems  Gastrointestinal (upper)  : Indigestion/heartburn  Gastrointestinal (lower) : Negative for lower GI symptoms  Constitutional : Fatigue  Skin: Negative for skin symptoms  Eyes: Negative for eye symptoms  Ear/Nose/Throat : Sinus problems  Hematologic/Lymphatic: Negative for Hematologic/Lymphatic symptoms  Cardiovascular : Leg swelling  Respiratory : Shortness of breath  Endocrine: Negative for endocrine symptoms  Musculoskeletal: Joint pain  Neurological: Headaches  Psychologic: Depression Anxiety

## 2020-04-20 ENCOUNTER — Telehealth (INDEPENDENT_AMBULATORY_CARE_PROVIDER_SITE_OTHER): Payer: Medicare Other | Admitting: Family Medicine

## 2020-04-20 ENCOUNTER — Encounter: Payer: Self-pay | Admitting: Family Medicine

## 2020-04-20 VITALS — BP 125/73 | HR 70 | Temp 97.1°F | Ht 70.0 in | Wt 213.0 lb

## 2020-04-20 DIAGNOSIS — R351 Nocturia: Secondary | ICD-10-CM | POA: Diagnosis not present

## 2020-04-20 DIAGNOSIS — J302 Other seasonal allergic rhinitis: Secondary | ICD-10-CM | POA: Diagnosis not present

## 2020-04-20 DIAGNOSIS — E669 Obesity, unspecified: Secondary | ICD-10-CM | POA: Diagnosis not present

## 2020-04-20 DIAGNOSIS — E559 Vitamin D deficiency, unspecified: Secondary | ICD-10-CM | POA: Diagnosis not present

## 2020-04-20 DIAGNOSIS — R001 Bradycardia, unspecified: Secondary | ICD-10-CM

## 2020-04-20 DIAGNOSIS — R062 Wheezing: Secondary | ICD-10-CM

## 2020-04-20 DIAGNOSIS — F322 Major depressive disorder, single episode, severe without psychotic features: Secondary | ICD-10-CM | POA: Diagnosis not present

## 2020-04-20 DIAGNOSIS — Z950 Presence of cardiac pacemaker: Secondary | ICD-10-CM | POA: Diagnosis not present

## 2020-04-20 DIAGNOSIS — E785 Hyperlipidemia, unspecified: Secondary | ICD-10-CM

## 2020-04-20 NOTE — Assessment & Plan Note (Signed)
Mild symptoms , symptomatic management nly , sudafed as needed

## 2020-04-20 NOTE — Assessment & Plan Note (Signed)
Controlled with symbicort, very infrequent use of albuterol

## 2020-04-20 NOTE — Assessment & Plan Note (Signed)
Uncontrolled howver in intensive treatment through the [place], not suicidal or homicidal

## 2020-04-20 NOTE — Assessment & Plan Note (Signed)
Improved with flomax

## 2020-04-20 NOTE — Progress Notes (Signed)
Virtual Visit via Telephone Note  I connected with Terry Love on 04/20/20 at  8:00 AM EST by telephone and verified that I am speaking with the correct person using two identifiers.  Location: Patient: home  Provider: work   I discussed the limitations, risks, security and privacy concerns of performing an evaluation and management service by telephone and the availability of in person appointments. I also discussed with the patient that there may be a patient responsible charge related to this service. The patient expressed understanding and agreed to proceed.   History of Present Illness: F/U chronic prolems , medication review and update labs Denies recent fever or chills. Denies sinus pressure, nasal congestion, ear pain or sore throat. Denies chest congestion, productive cough or wheezing. Denies chest pains, palpitations , no orthopnea, no PND Denies abdominal pain, nausea, vomiting,diarrhea or constipation.   Denies dysuria, frequency, hesitancy or incontinence. C/o intermittent left elbow and left wrist pain, relieved by tylenol Denies headaches, seizures, numbness, or tingling. c/o depression, anxiety or insomnia. Denies skin break down or rash. Doing well overall except for depression, not suicidal or homicidal, speaks with therapist every 3 weeks Mild sinus symptoms  Intermitently, relieved with sudafed Irregular and insufficient exercise, intends to work on this      Observations/Objective: BP 125/73   Pulse 70   Temp (!) 97.1 F (36.2 C)   Ht 5\' 10"  (1.778 m)   Wt 213 lb (96.6 kg)   BMI 30.56 kg/m   Good communication with no confusion and intact memory. Alert and oriented x 3 No signs of respiratory distress during speech    Assessment and Plan:  Hyperlipidemia LDL goal <100 Hyperlipidemia:Low fat diet discussed and encouraged.   Lipid Panel  Lab Results  Component Value Date   CHOL 145 12/01/2019   HDL 51 12/01/2019   LDLCALC 80 12/01/2019    TRIG 65 12/01/2019   CHOLHDL 2.8 12/01/2019     Updated lab needed at/ before next visit. Controlled, no change in medication   Depression, major, single episode, severe (HCC) Uncontrolled howver in intensive treatment through the [place], not suicidal or homicidal  Allergic rhinitis Mild symptoms , symptomatic management nly , sudafed as needed  Obesity (BMI 30.0-34.9)  Patient re-educated about  the importance of commitment to a  minimum of 150 minutes of exercise per week as able.  The importance of healthy food choices with portion control discussed, as well as eating regularly and within a 12 hour window most days. The need to choose "clean , green" food 50 to 75% of the time is discussed, as well as to make water the primary drink and set a goal of 64 ounces water daily.    Weight /BMI 04/20/2020 04/19/2020 03/08/2020  WEIGHT 213 lb 213 lb 202 lb  HEIGHT 5\' 10"  - 5\' 10"   BMI 30.56 kg/m2 30.56 kg/m2 28.98 kg/m2      Wheezing Controlled with symbicort, very infrequent use of albuterol  Pacemaker Managed by Cardiology  Nocturia Improved with flomax   Follow Up Instructions:    I discussed the assessment and treatment plan with the patient. The patient was provided an opportunity to ask questions and all were answered. The patient agreed with the plan and demonstrated an understanding of the instructions.   The patient was advised to call back or seek an in-person evaluation if the symptoms worsen or if the condition fails to improve as anticipated.  I provided 20 minutes of non-face-to-face time during this  encounter.   Tula Nakayama, MD

## 2020-04-20 NOTE — Patient Instructions (Addendum)
F/U in office with MD in 6 months, call if you need me before  Please get fasting lipid, cmp and EGFR, CBC, TSH and vit D Feb 2 or after  It is important that you exercise regularly at least 30 minutes 5 times a week. If you develop chest pain, have severe difficulty breathing, or feel very tired, stop exercising immediately and seek medical attention    Think about what you will eat, plan ahead. Choose " clean, green, fresh or frozen" over canned, processed or packaged foods which are more sugary, salty and fatty. 70 to 75% of food eaten should be vegetables and fruit. Three meals at set times with snacks allowed between meals, but they must be fruit or vegetables. Aim to eat over a 12 hour period , example 7 am to 7 pm, and STOP after  your last meal of the day. Drink water,generally about 64 ounces per day, no other drink is as healthy. Fruit juice is best enjoyed in a healthy way, by EATING the fruit.   No medication changes at this time   Thanks for choosing [facility], we consider it a privelige to serve you.  Best for 2022!

## 2020-04-20 NOTE — Assessment & Plan Note (Signed)
  Patient re-educated about  the importance of commitment to a  minimum of 150 minutes of exercise per week as able.  The importance of healthy food choices with portion control discussed, as well as eating regularly and within a 12 hour window most days. The need to choose "clean , green" food 50 to 75% of the time is discussed, as well as to make water the primary drink and set a goal of 64 ounces water daily.    Weight /BMI 04/20/2020 04/19/2020 03/08/2020  WEIGHT 213 lb 213 lb 202 lb  HEIGHT 5\' 10"  - 5\' 10"   BMI 30.56 kg/m2 30.56 kg/m2 28.98 kg/m2

## 2020-04-20 NOTE — Assessment & Plan Note (Signed)
Hyperlipidemia:Low fat diet discussed and encouraged.   Lipid Panel  Lab Results  Component Value Date   CHOL 145 12/01/2019   HDL 51 12/01/2019   LDLCALC 80 12/01/2019   TRIG 65 12/01/2019   CHOLHDL 2.8 12/01/2019     Updated lab needed at/ before next visit. Controlled, no change in medication

## 2020-04-20 NOTE — Assessment & Plan Note (Signed)
Managed by Cardiology.

## 2020-04-22 ENCOUNTER — Ambulatory Visit (INDEPENDENT_AMBULATORY_CARE_PROVIDER_SITE_OTHER): Payer: Medicare Other | Admitting: Cardiology

## 2020-04-22 ENCOUNTER — Encounter: Payer: Self-pay | Admitting: Cardiology

## 2020-04-22 ENCOUNTER — Other Ambulatory Visit: Payer: Self-pay

## 2020-04-22 VITALS — BP 116/74 | HR 67 | Ht 70.0 in | Wt 219.0 lb

## 2020-04-22 DIAGNOSIS — R0600 Dyspnea, unspecified: Secondary | ICD-10-CM

## 2020-04-22 DIAGNOSIS — E782 Mixed hyperlipidemia: Secondary | ICD-10-CM | POA: Diagnosis not present

## 2020-04-22 DIAGNOSIS — Z950 Presence of cardiac pacemaker: Secondary | ICD-10-CM

## 2020-04-22 DIAGNOSIS — R001 Bradycardia, unspecified: Secondary | ICD-10-CM

## 2020-04-22 DIAGNOSIS — R0609 Other forms of dyspnea: Secondary | ICD-10-CM

## 2020-04-22 NOTE — Patient Instructions (Addendum)
Medication Instructions:  Your physician recommends that you continue on your current medications as directed. Please refer to the Current Medication list given to you today.  *If you need a refill on your cardiac medications before your next appointment, please call your pharmacy*   Lab Work: None today If you have labs (blood work) drawn today and your tests are completely normal, you will receive your results only by: . MyChart Message (if you have MyChart) OR . A paper copy in the mail If you have any lab test that is abnormal or we need to change your treatment, we will call you to review the results.   Testing/Procedures: None today   Follow-Up: At [facility], you and your health needs are our priority.  As part of our continuing mission to provide you with exceptional heart care, we have created designated Provider Care Teams.  These Care Teams include your primary Cardiologist (physician) and Advanced Practice Providers (APPs -  Physician Assistants and Nurse Practitioners) who all work together to provide you with the care you need, when you need it.  We recommend signing up for the patient portal called "MyChart".  Sign up information is provided on this After Visit Summary.  MyChart is used to connect with patients for Virtual Visits (Telemedicine).  Patients are able to view lab/test results, encounter notes, upcoming appointments, etc.  Non-urgent messages can be sent to your provider as well.   To learn more about what you can do with MyChart, go to [email or web address].    Your next appointment:   6 month(s)  The format for your next appointment:   In Person  Provider:   Jonathan Branch, MD   Other Instructions None       Thank you for choosing [facility] !         

## 2020-04-22 NOTE — Progress Notes (Signed)
Clinical Summary Terry Love is a 85 y.o.male seen today for follow up of the following medical problems.    1. Symptomatic bradycardia - pacemaker followed by Dr Terry Love - 03/2020 device check with normal function - no recent symptoms.    2. Hyperlipidemia - 11/2019 TC 145 HDL 51 TG 65 LDL 80 - compliant with statin  3. SOB/DOE - chronic DOE, overall unchanged - extensive cardiac and pulmonary workup over the years as reported below overall benign    Has had covid vaccine x 3 shots.   Past Medical History:  Diagnosis Date  . Anxiety   . Asthma   . Bradycardia    Chronic   . Colon polyps   . Depression   . Facial tic   . Glaucoma   . Hearing loss   . Heart disease   . High cholesterol   . Hyperlipidemia   . 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 Obesity   . Seasonal allergies      No Known Allergies   Current Outpatient Medications  Medication Sig Dispense Refill  . albuterol (VENTOLIN HFA) 108 (90 Base) MCG/ACT inhaler INHALE 2 PUFFS INTO THE LUNGS EVERY 4 (FOUR) HOURS AS NEEDED FOR WHEEZING OR SHORTNESS OF BREATH 18 g 11  . aspirin 81 MG EC tablet Take 81 mg by mouth at bedtime.    Marland Kitchen atorvastatin (LIPITOR) 40 MG tablet TAKE 1 TABLET AT BEDTIME 90 tablet 1  . azelastine (ASTELIN) 0.1 % nasal spray Place 2 sprays into both nostrils 2 (two) times daily. Use in each nostril as directed 30 mL 12  . benzonatate (TESSALON) 100 MG capsule TAKE 1 CAPSULE (100 MG TOTAL) BY MOUTH 2 (TWO) TIMES DAILY AS NEEDED FOR COUGH. 20 capsule 1  . Brinzolamide-Brimonidine 1-0.2 % SUSP Place 1 drop into both eyes 2 (two) times daily.     . budesonide-formoterol (SYMBICORT) 160-4.5 MCG/ACT inhaler TAKE 2 PUFFS BY MOUTH TWICE A DAY 1 each 5  . calcium-vitamin D (OSCAL WITH D) 500-200 MG-UNIT tablet Take 1 tablet by mouth 2 (two) times daily. 150 tablet 1  . dorzolamide (TRUSOPT) 2 % ophthalmic solution 1 drop 2 (two) times daily.    Marland Kitchen gabapentin (NEURONTIN) 100  MG capsule TAKE 1 CAPSULE BY MOUTH THREE TIMES A DAY 90 capsule 1  . latanoprost (XALATAN) 0.005 % ophthalmic solution Place 1 drop into both eyes at bedtime.    . Multiple Vitamin (MULTIVITAMIN WITH MINERALS) TABS tablet Take 1 tablet by mouth at bedtime.    . risperiDONE (RISPERDAL) 0.25 MG tablet Take 0.25 mg by mouth 2 (two) times daily.    . tamsulosin (FLOMAX) 0.4 MG CAPS capsule Take 1 capsule (0.4 mg total) by mouth daily. 90 capsule 3  . venlafaxine XR (EFFEXOR-XR) 75 MG 24 hr capsule TAKE 1 CAPSULE BY MOUTH DAILY WITH BREAKFAST 90 capsule 2  . vitamin C (ASCORBIC ACID) 500 MG tablet Take 500 mg by mouth at bedtime.     . Vitamin D, Ergocalciferol, (DRISDOL) 1.25 MG (50000 UNIT) CAPS capsule TAKE 1 CAPSULE (50,000 UNITS TOTAL) BY MOUTH EVERY MONDAY. IN THE MORNING 12 capsule 1  . Wheat Dextrin (BENEFIBER DRINK MIX PO) Take 1 Dose by mouth daily as needed (for regularity). Use as directed daily     No current facility-administered medications for this visit.     Past Surgical History:  Procedure Laterality Date  . carpal tunnel  release right  2006  . COLONOSCOPY    .  COLONOSCOPY  01/25/2011   Dr. Rehman:Examination performed to cecum Pan colonic diverticulosis/2 small polyps ablated via cold biopsy from transverse colon/External hemorrhoids, benign polyps  . COLONOSCOPY N/A 06/29/2013   Procedure: COLONOSCOPY;  Surgeon: Terry Dolin, MD;  Location: [facility];  Service: Endoscopy;  Laterality: N/A;  10:45  . EYE SURGERY     left eye cataracts   . PACEMAKER IMPLANT N/A 03/24/2018   Procedure: PACEMAKER IMPLANT;  Surgeon: Terry Lance, MD;  Location: [place] [facility];  Service: Cardiovascular;  Laterality: N/A;     No Known Allergies    Family History  Problem Relation Age of Onset  . Colon cancer Mother        diagnosed at age 20  . Aneurysm Father   . Aneurysm Brother   . Stroke Brother      Social History Terry Love reports that he has never smoked. He  quit smokeless tobacco use about 4 years ago.  His smokeless tobacco use included chew. Terry Love reports no history of alcohol use.   Review of Systems CONSTITUTIONAL: No weight loss, fever, chills, weakness or fatigue.  HEENT: Eyes: No visual loss, blurred vision, double vision or yellow sclerae.No hearing loss, sneezing, congestion, runny nose or sore throat.  SKIN: No rash or itching.  CARDIOVASCULAR: per hpi RESPIRATORY: per hpi GASTROINTESTINAL: No anorexia, nausea, vomiting or diarrhea. No abdominal pain or blood.  GENITOURINARY: No burning on urination, no polyuria NEUROLOGICAL: No headache, dizziness, syncope, paralysis, ataxia, numbness or tingling in the extremities. No change in bowel or bladder control.  MUSCULOSKELETAL: No muscle, back pain, joint pain or stiffness.  LYMPHATICS: No enlarged nodes. No history of splenectomy.  PSYCHIATRIC: No history of depression or anxiety.  ENDOCRINOLOGIC: No reports of sweating, cold or heat intolerance. No polyuria or polydipsia.  Marland Kitchen   Physical Examination Today's Vitals   04/22/20 1246  BP: 116/74  Pulse: 67  SpO2: 97%  Weight: 219 lb (99.3 kg)  Height: 5\' 10"  (1.778 m)   Body mass index is 31.42 kg/m.  Gen: resting comfortably, no acute distress HEENT: no scleral icterus, pupils equal round and reactive, no palptable cervical adenopathy,  CV: RRR, no m/r/g no jvd Resp: Clear to auscultation bilaterally GI: abdomen is soft, non-tender, non-distended, normal bowel sounds, no hepatosplenomegaly MSK: extremities are warm, no edema.  Skin: warm, no rash Neuro:  no focal deficits Psych: appropriate affect   Diagnostic Studies 02/2018 echo Study Conclusions   - Left ventricle: The cavity size was normal. Wall thickness was  increased increased in a pattern of mild to moderate LVH.  Systolic function was normal. The estimated ejection fraction was  in the range of 60% to 65%. Wall motion was normal; there were no   regional wall motion abnormalities. Doppler parameters are  consistent with abnormal left ventricular relaxation (grade 1  diastolic dysfunction).  - Aortic valve: Moderately calcified annulus. Trileaflet; mildly  calcified leaflets. There was mild regurgitation.  - Mitral valve: Mildly calcified annulus. There was trivial  regurgitation.  - Right atrium: Central venous pressure (est): 3 mm Hg.  - Atrial septum: No defect or patent foramen ovale was identified.  - Tricuspid valve: There was trivial regurgitation.  - Pulmonary arteries: Systolic pressure could not be accurately  estimated.  - Pericardium, extracardiac: There was no pericardial effusion.    02/2018 GXT  Nondiagnostic exercise treadmill test for ischemia due to blunted heart rate response, however consistent with chronotropic incompetence with patient achieving only  57% of MPHR. Test discontinued due to patient fatigue and shortness of breath.  Blood pressure demonstrated a normal response to exercise.     03/2016 nuclear stress  There was no ST segment deviation noted during stress.  The study is normal. No ischemia or scar.  This is a low risk study.  Nuclear stress EF: 70%.   08/2016 PFTs Minimal diffusion defect, no obstruction      Assessment and Plan  1. Pacemaker/symptomatic bradycardia/sinus bradycardia - doing well without symptoms, recent device check was normal EKG today shows A pacing V sensing - continue to monitor  2. Hyperlipidemia - at goal, continue current meds  3. DOE - chronic symptoms overall unchanged, extensive workup over the last few years fairly benign - continue to monitor at this time.    F/u 6 months   Arnoldo Lenis, M.D.

## 2020-04-28 ENCOUNTER — Telehealth: Payer: Self-pay | Admitting: *Deleted

## 2020-04-28 NOTE — Telephone Encounter (Signed)
Rx for Budesonide-formoterol 160-4.5 is not on patient drug formulary. Insurance provided preferred list: Advair 250/50, Breo Ellipta 100-25, Advair HFA 115/21 and Symbicort 160 4.5. Please advise of change.

## 2020-04-29 MED ORDER — BREO ELLIPTA 100-25 MCG/INH IN AEPB: 1.0000 | INHALATION_SPRAY | Freq: Every day | RESPIRATORY_TRACT | 6 refills | Status: DC

## 2020-04-29 NOTE — Telephone Encounter (Signed)
Med list has been updated.  breo has been sent to the pharmacy.  Nothing further is needed.

## 2020-04-29 NOTE — Telephone Encounter (Signed)
Please order Breo Ellipta 100-25

## 2020-05-11 ENCOUNTER — Encounter: Payer: Self-pay | Admitting: Internal Medicine

## 2020-05-11 DIAGNOSIS — R001 Bradycardia, unspecified: Secondary | ICD-10-CM | POA: Diagnosis not present

## 2020-05-11 DIAGNOSIS — E669 Obesity, unspecified: Secondary | ICD-10-CM | POA: Diagnosis not present

## 2020-05-11 DIAGNOSIS — E559 Vitamin D deficiency, unspecified: Secondary | ICD-10-CM | POA: Diagnosis not present

## 2020-05-11 DIAGNOSIS — E785 Hyperlipidemia, unspecified: Secondary | ICD-10-CM | POA: Diagnosis not present

## 2020-05-12 LAB — LIPID PANEL
Chol/HDL Ratio: 4.1 ratio (ref 0.0–5.0)
Cholesterol, Total: 215 mg/dL — ABNORMAL HIGH (ref 100–199)
HDL: 52 mg/dL (ref >39)
LDL Chol Calc (NIH): 149 mg/dL — ABNORMAL HIGH (ref 0–99)
Triglycerides: 76 mg/dL (ref 0–149)
VLDL Cholesterol Cal: 14 mg/dL (ref 5–40)

## 2020-05-12 LAB — CMP14+EGFR
ALT: 21 IU/L (ref 0–44)
AST: 25 IU/L (ref 0–40)
Albumin/Globulin Ratio: 1.6 (ref 1.2–2.2)
Albumin: 4.4 g/dL (ref 3.6–4.6)
Alkaline Phosphatase: 67 IU/L (ref 44–121)
BUN/Creatinine Ratio: 13 (ref 10–24)
BUN: 11 mg/dL (ref 8–27)
Bilirubin Total: 0.6 mg/dL (ref 0.0–1.2)
CO2: 21 mmol/L (ref 20–29)
Calcium: 9.3 mg/dL (ref 8.6–10.2)
Chloride: 105 mmol/L (ref 96–106)
Creatinine, Ser: 0.86 mg/dL (ref 0.76–1.27)
GFR calc Af Amer: 93 mL/min/{1.73_m2} (ref >59)
GFR calc non Af Amer: 81 mL/min/{1.73_m2} (ref >59)
Globulin, Total: 2.8 g/dL (ref 1.5–4.5)
Glucose: 98 mg/dL (ref 65–99)
Potassium: 4.3 mmol/L (ref 3.5–5.2)
Sodium: 142 mmol/L (ref 134–144)
Total Protein: 7.2 g/dL (ref 6.0–8.5)

## 2020-05-12 LAB — CBC
Hematocrit: 40.1 % (ref 37.5–51.0)
Hemoglobin: 13.1 g/dL (ref 13.0–17.7)
MCH: 30 pg (ref 26.6–33.0)
MCHC: 32.7 g/dL (ref 31.5–35.7)
MCV: 92 fL (ref 79–97)
Platelets: 278 10*3/uL (ref 150–450)
RBC: 4.37 x10E6/uL (ref 4.14–5.80)
RDW: 11.2 % — ABNORMAL LOW (ref 11.6–15.4)
WBC: 5.2 10*3/uL (ref 3.4–10.8)

## 2020-05-12 LAB — TSH: TSH: 1.86 u[IU]/mL (ref 0.450–4.500)

## 2020-05-12 LAB — VITAMIN D 25 HYDROXY (VIT D DEFICIENCY, FRACTURES): Vit D, 25-Hydroxy: 43.4 ng/mL (ref 30.0–100.0)

## 2020-05-12 NOTE — Addendum Note (Signed)
Addended by: Eual Fines on: 05/12/2020 02:29 PM   Modules accepted: Orders

## 2020-05-24 ENCOUNTER — Telehealth: Payer: Self-pay | Admitting: Pulmonary Disease

## 2020-05-24 DIAGNOSIS — J4530 Mild persistent asthma, uncomplicated: Secondary | ICD-10-CM

## 2020-05-24 NOTE — Telephone Encounter (Signed)
ATC patient, phone line busy. Attempted alternate number no voicemail.

## 2020-05-25 ENCOUNTER — Other Ambulatory Visit: Payer: Self-pay | Admitting: Pulmonary Disease

## 2020-05-25 ENCOUNTER — Encounter: Payer: Self-pay | Admitting: *Deleted

## 2020-05-25 DIAGNOSIS — J4530 Mild persistent asthma, uncomplicated: Secondary | ICD-10-CM

## 2020-05-25 MED ORDER — BUDESONIDE-FORMOTEROL FUMARATE 160-4.5 MCG/ACT IN AERO: 2.0000 | INHALATION_SPRAY | Freq: Two times a day (BID) | RESPIRATORY_TRACT | 6 refills | Status: DC

## 2020-05-25 NOTE — Telephone Encounter (Signed)
05/25/2020  Reached patient. Confirmed addressed. Sent in refills of Symbicort 160 based off of March/2021 office no documentation by Dr. Vaughan Browner. Schedule patient for his 1 year follow-up in March/2022.  Patient would like documented letter mailed out to him. I have confirmed his address on file.  Patient would like documented letter to have the exact date and time of the office visit as well as the address for physical location in [place].  At upcoming office visit would be beneficial to discuss with patient if you would like to establish in the [place] office moving forward.  We will route to Lauren to coordinate letter production as well as be mailed out the patient.  Wyn Quaker, FNP

## 2020-05-26 NOTE — Telephone Encounter (Signed)
Letter has been created and sent to patient. Nothing further needed at this time.

## 2020-06-02 ENCOUNTER — Encounter: Payer: Self-pay | Admitting: Internal Medicine

## 2020-06-06 ENCOUNTER — Other Ambulatory Visit: Payer: Self-pay | Admitting: Family Medicine

## 2020-06-09 ENCOUNTER — Other Ambulatory Visit: Payer: Self-pay

## 2020-06-09 MED ORDER — ATORVASTATIN CALCIUM 40 MG PO TABS: 40.0000 mg | ORAL_TABLET | Freq: Every day | ORAL | 3 refills | Status: DC

## 2020-06-14 ENCOUNTER — Ambulatory Visit (INDEPENDENT_AMBULATORY_CARE_PROVIDER_SITE_OTHER): Payer: Medicare Other | Admitting: Internal Medicine

## 2020-06-14 ENCOUNTER — Ambulatory Visit: Payer: Medicare Other | Admitting: Internal Medicine

## 2020-06-14 ENCOUNTER — Other Ambulatory Visit: Payer: Self-pay

## 2020-06-14 ENCOUNTER — Encounter: Payer: Self-pay | Admitting: Internal Medicine

## 2020-06-14 VITALS — BP 127/79 | HR 73 | Temp 97.5°F | Ht 70.0 in | Wt 219.8 lb

## 2020-06-14 DIAGNOSIS — E669 Obesity, unspecified: Secondary | ICD-10-CM

## 2020-06-14 DIAGNOSIS — Z86010 Personal history of colonic polyps: Secondary | ICD-10-CM

## 2020-06-14 NOTE — Progress Notes (Signed)
Primary Care Physician:  Fayrene Helper, MD Primary Gastroenterologist:  Dr. Gala Romney  Pre-Procedure History & Physical: HPI:  Terry Love is a 85 y.o. male here for 1 year follow-up.  Last colonoscopy 2015 yielded small adenoma and a couple of small hyperplastic polyps.  He has no bowel symptoms these days has not had any melena, rectal bleeding or change in bowel habits.  He has gained 7 pounds since he was seen here 1 year ago.  He does complain of chronic dyspnea on exertion his exercise tolerance is poor.  By report, he has been thoroughly evaluated by Dr. Harl Bowie. He does complain of bloating from time to time he and his wife report that he loves to eat.  He eats large portions and snacks in between meals.  Certainly, is not had any nausea or vomiting.  No out and out postprandial abdominal pain.  No odynophagia, dysphagia, early satiety or reflux symptoms. History of anemia which resolved based on last blood work done previously.  Past Medical History:  Diagnosis Date  . Anxiety   . Asthma   . Bradycardia    Chronic   . Colon polyps   . Depression   . Facial tic   . Glaucoma   . Hearing loss   . Heart disease   . High cholesterol   . Hyperlipidemia   . 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 Obesity   . Seasonal allergies     Past Surgical History:  Procedure Laterality Date  . carpal tunnel  release right  2006  . COLONOSCOPY    . COLONOSCOPY  01/25/2011   Dr. Rehman:Examination performed to cecum Pan colonic diverticulosis/2 small polyps ablated via cold biopsy from transverse colon/External hemorrhoids, benign polyps  . COLONOSCOPY N/A 06/29/2013   Procedure: COLONOSCOPY;  Surgeon: Daneil Dolin, MD;  Location: [facility];  Service: Endoscopy;  Laterality: N/A;  10:45  . EYE SURGERY     left eye cataracts   . PACEMAKER IMPLANT N/A 03/24/2018   Procedure: PACEMAKER IMPLANT;  Surgeon: Evans Lance, MD;  Location: [facility];   Service: Cardiovascular;  Laterality: N/A;    Prior to Admission medications   Medication Sig Start Date End Date Taking? Authorizing Provider  albuterol (VENTOLIN HFA) 108 (90 Base) MCG/ACT inhaler INHALE 2 PUFFS INTO THE LUNGS EVERY 4 (FOUR) HOURS AS NEEDED FOR WHEEZING OR SHORTNESS OF BREATH 06/18/19  Yes Mannam, Praveen, MD  aspirin 81 MG EC tablet Take 81 mg by mouth at bedtime.   Yes [provider]  atorvastatin (LIPITOR) 40 MG tablet Take 1 tablet (40 mg total) by mouth at bedtime. 06/09/20  Yes Fayrene Helper, MD  azelastine (ASTELIN) 0.1 % nasal spray Place 2 sprays into both nostrils 2 (two) times daily. Use in each nostril as directed 04/29/19  Yes Fayrene Helper, MD  benzonatate (TESSALON) 100 MG capsule TAKE 1 CAPSULE (100 MG TOTAL) BY MOUTH 2 (TWO) TIMES DAILY AS NEEDED FOR COUGH. 04/04/20  Yes Fayrene Helper, MD  Brinzolamide-Brimonidine 1-0.2 % SUSP Place 1 drop into both eyes 2 (two) times daily.    Yes [provider]  budesonide-formoterol (SYMBICORT) 160-4.5 MCG/ACT inhaler TAKE 2 PUFFS BY MOUTH TWICE A DAY 05/25/20  Yes Mannam, Praveen, MD  budesonide-formoterol (SYMBICORT) 160-4.5 MCG/ACT inhaler Inhale 2 puffs into the lungs in the morning and at bedtime. 05/25/20  Yes Lauraine Rinne, NP  calcium-vitamin D (OSCAL WITH D) 500-200  MG-UNIT tablet Take 1 tablet by mouth 2 (two) times daily. 10/01/16  Yes Fayrene Helper, MD  dorzolamide (TRUSOPT) 2 % ophthalmic solution 1 drop 2 (two) times daily.   Yes [provider]  gabapentin (NEURONTIN) 100 MG capsule TAKE 1 CAPSULE BY MOUTH THREE TIMES A DAY 06/07/20  Yes Fayrene Helper, MD  latanoprost (XALATAN) 0.005 % ophthalmic solution Place 1 drop into both eyes at bedtime.   Yes [provider]  Multiple Vitamin (MULTIVITAMIN WITH MINERALS) TABS tablet Take 1 tablet by mouth at bedtime.   Yes [provider]  risperiDONE (RISPERDAL) 0.25 MG tablet Take 0.25 mg by mouth 2  (two) times daily.   Yes [provider]  tamsulosin (FLOMAX) 0.4 MG CAPS capsule Take 1 capsule (0.4 mg total) by mouth daily. 04/19/20  Yes McKenzie, Candee Furbish, MD  venlafaxine XR (EFFEXOR-XR) 75 MG 24 hr capsule TAKE 1 CAPSULE BY MOUTH DAILY WITH BREAKFAST 02/01/20  Yes Fayrene Helper, MD  vitamin C (ASCORBIC ACID) 500 MG tablet Take 500 mg by mouth at bedtime.    Yes [provider]  Vitamin D, Ergocalciferol, (DRISDOL) 1.25 MG (50000 UNIT) CAPS capsule TAKE 1 CAPSULE (50,000 UNITS TOTAL) BY MOUTH EVERY MONDAY. IN THE MORNING 01/04/20  Yes Fayrene Helper, MD  Wheat Dextrin (BENEFIBER DRINK MIX PO) Take 1 Dose by mouth daily as needed (for regularity). Use as directed daily   Yes [provider]    Allergies as of 06/14/2020  . (No Known Allergies)    Family History  Problem Relation Age of Onset  . Colon cancer Mother        diagnosed at age 59  . Aneurysm Father   . Aneurysm Brother   . Stroke Brother     Social History   Socioeconomic History  . Marital status: Married    Spouse name: Ceasar Lund   . Number of children: 0  . Years of education: 64  . Highest education level: 12th grade  Occupational History  . Occupation: retired  Tobacco Use  . Smoking status: Never Smoker  . Smokeless tobacco: Former Systems developer    Types: Secondary school teacher  . Vaping Use: Never used  Substance and Sexual Activity  . Alcohol use: No    Alcohol/week: 0.0 standard drinks  . Drug use: No  . Sexual activity: Not Currently  Other Topics Concern  . Not on file  Social History Narrative  . Not on file   Social Determinants of Health   Financial Resource Strain: Low Risk   . Difficulty of Paying Living Expenses: Not hard at all  Food Insecurity: No Food Insecurity  . Worried About Charity fundraiser in the Last Year: Never true  . Ran Out of Food in the Last Year: Never true  Transportation Needs: No Transportation Needs  . Lack of Transportation (Medical):  No  . Lack of Transportation (Non-Medical): No  Physical Activity: Inactive  . Days of Exercise per Week: 0 days  . Minutes of Exercise per Session: 0 min  Stress: Stress Concern Present  . Feeling of Stress : To some extent  Social Connections: Moderately Integrated  . Frequency of Communication with Friends and Family: Three times a week  . Frequency of Social Gatherings with Friends and Family: Once a week  . Attends Religious Services: More than 4 times per year  . Active Member of Clubs or Organizations: No  . Attends Archivist Meetings: Never  .  Marital Status: Married  Human resources officer Violence: Not At Risk  . Fear of Current or Ex-Partner: No  . Emotionally Abused: No  . Physically Abused: No  . Sexually Abused: No    Review of Systems: See HPI, otherwise negative ROS  Physical Exam: BP 127/79   Pulse 73   Temp (!) 97.5 F (36.4 C) (Temporal)   Ht 5\' 10"  (1.778 m)   Wt 219 lb 12.8 oz (99.7 kg)   BMI 31.54 kg/m  General:   Alert, , pleasant and cooperative in NAD.  Accompanied by his wife. Neck:  Supple; no masses or thyromegaly. No significant cervical adenopathy. Lungs:  Clear throughout to auscultation.   No wheezes, crackles, or rhonchi. No acute distress. Heart:  Regular rate and rhythm; no murmurs, clicks, rubs,  or gallops. Abdomen: Obese.  Non-distended, normal bowel sounds.  Soft and nontender without appreciable mass or hepatosplenomegaly.  Pulses:  Normal pulses noted. Extremities:  Without clubbing or edema.  Impression/Plan: Pleasant 85 year old gentleman who returns for 1 year follow-up.  He is gained 9 pounds.  He complains of increasing abdominal girth and bloating.  He has a sedentary lifestyle and 5 dietary recall takes in a significant amount of excess calories. Otherwise, he is devoid of any GI issues.  I suspect his bloating is in large part due to increasing abdominal girth from adipose accumulation.  He had small adenoma removed in  2015.  I do not recommend a future colonoscopy unless you develop new symptoms. Documentations:  As discussed, I feel weight loss (about 20 pounds) would get pt feeling much better.  I do not feel that bloating symptoms are due to any significant underlying illness  I do recommend pt Dr. Moshe Cipro in regards to diet and lifestyle modification  I recommend gradually increasing exercise tolerance  -  will be a key factor in successfully losing weight  A future colonoscopy is not recommended unless new symptoms develop  No scheduled follow-up appointment with me at this time.  However, if any issues arise please do not hesitate to call me.   Notice: This dictation was prepared with Dragon dictation along with smaller phrase technology. Any transcriptional errors that result from this process are unintentional and may not be corrected upon review.

## 2020-06-14 NOTE — Patient Instructions (Signed)
As discussed, I feel weight loss (about 20 pounds) would get you feeling much better.  I do not feel that your bloating symptoms are due to any significant underlying illness  I do recommend seeing Dr. Moshe Cipro in regards to diet and lifestyle modification  I recommend gradually increasing your exercise tolerance will be a key factor in successfully losing weight  A future colonoscopy is not recommended unless new symptoms develop  No scheduled follow-up appointment with me at this time.  However, if any issues arise please do not hesitate to call me.

## 2020-06-27 ENCOUNTER — Other Ambulatory Visit: Payer: Self-pay

## 2020-06-27 ENCOUNTER — Ambulatory Visit (INDEPENDENT_AMBULATORY_CARE_PROVIDER_SITE_OTHER): Payer: Medicare Other

## 2020-06-27 ENCOUNTER — Encounter: Payer: Self-pay | Admitting: Pulmonary Disease

## 2020-06-27 ENCOUNTER — Ambulatory Visit (INDEPENDENT_AMBULATORY_CARE_PROVIDER_SITE_OTHER): Payer: Medicare Other | Admitting: Pulmonary Disease

## 2020-06-27 VITALS — BP 114/72 | HR 66 | Temp 97.0°F | Ht 70.0 in | Wt 220.2 lb

## 2020-06-27 DIAGNOSIS — R0600 Dyspnea, unspecified: Secondary | ICD-10-CM | POA: Diagnosis not present

## 2020-06-27 DIAGNOSIS — Z950 Presence of cardiac pacemaker: Secondary | ICD-10-CM | POA: Diagnosis not present

## 2020-06-27 DIAGNOSIS — J4530 Mild persistent asthma, uncomplicated: Secondary | ICD-10-CM

## 2020-06-27 LAB — BASIC METABOLIC PANEL
BUN: 14 mg/dL (ref 6–23)
CO2: 27 mEq/L (ref 19–32)
Calcium: 9.4 mg/dL (ref 8.4–10.5)
Chloride: 106 mEq/L (ref 96–112)
Creatinine, Ser: 0.73 mg/dL (ref 0.40–1.50)
GFR: 84.37 mL/min (ref >60.00)
Glucose, Bld: 101 mg/dL — ABNORMAL HIGH (ref 70–99)
Potassium: 4 mEq/L (ref 3.5–5.1)
Sodium: 140 mEq/L (ref 135–145)

## 2020-06-27 LAB — CBC WITH DIFFERENTIAL/PLATELET
Basophils Absolute: 0 10*3/uL (ref 0.0–0.1)
Basophils Relative: 0.8 % (ref 0.0–3.0)
Eosinophils Absolute: 0.2 10*3/uL (ref 0.0–0.7)
Eosinophils Relative: 3.2 % (ref 0.0–5.0)
HCT: 37.6 % — ABNORMAL LOW (ref 39.0–52.0)
Hemoglobin: 12.6 g/dL — ABNORMAL LOW (ref 13.0–17.0)
Lymphocytes Relative: 27.2 % (ref 12.0–46.0)
Lymphs Abs: 1.4 10*3/uL (ref 0.7–4.0)
MCHC: 33.5 g/dL (ref 30.0–36.0)
MCV: 91.2 fl (ref 78.0–100.0)
Monocytes Absolute: 0.6 10*3/uL (ref 0.1–1.0)
Monocytes Relative: 10.6 % (ref 3.0–12.0)
Neutro Abs: 3.1 10*3/uL (ref 1.4–7.7)
Neutrophils Relative %: 58.2 % (ref 43.0–77.0)
Platelets: 258 10*3/uL (ref 150.0–400.0)
RBC: 4.12 Mil/uL — ABNORMAL LOW (ref 4.22–5.81)
RDW: 12.8 % (ref 11.5–15.5)
WBC: 5.3 10*3/uL (ref 4.0–10.5)

## 2020-06-27 MED ORDER — FUROSEMIDE 20 MG PO TABS: 20.0000 mg | ORAL_TABLET | Freq: Every day | ORAL | 2 refills | Status: DC

## 2020-06-27 NOTE — Progress Notes (Signed)
Terry Love    [id number]    1935/05/27  Primary Care Physician:Simpson, Norwood Levo, MD  Referring Physician: Arnoldo Lenis, MD [address] [place],  [place] [address]  Chief complaint:  Follow-up for mild persistent asthma  HPI: 85 year old with past medical history of insomnia, snoring, vitamin D deficiency, hyperlipidemia depression with anxiety. He was admitted in March 2018 with presumed COPD exacerbation treated with IV steroids, antibiotics and nebulizers. He was discharged on prednisone taper which he finished today and doxycycline. He is being maintained on albuterol inhaler but feels that his breathing has improved. He complained does not have any cough, dyspnea, shortness of breath, wheezing, sputum production, fevers, chills. He is lifelong nonsmoker but used to chew tobacco. He worked various jobs including the [employer] He does not report any exposures at work or at home. He has history of insomnia evaluated by a sleep study in February 2017. This was reportedly normal per the patient.  Interim history: Continues on Symbicort  Over the past 3 weeks he is complaining of increasing dyspnea on exertion.  Also notes abdominal wall swelling and lower extremity edema.  Denies any cough, congestion, wheezing  Outpatient Encounter Medications as of 06/27/2020  Medication Sig  . albuterol (VENTOLIN HFA) 108 (90 Base) MCG/ACT inhaler INHALE 2 PUFFS INTO THE LUNGS EVERY 4 (FOUR) HOURS AS NEEDED FOR WHEEZING OR SHORTNESS OF BREATH  . aspirin 81 MG EC tablet Take 81 mg by mouth at bedtime.  Marland Kitchen atorvastatin (LIPITOR) 40 MG tablet Take 1 tablet (40 mg total) by mouth at bedtime.  Marland Kitchen azelastine (ASTELIN) 0.1 % nasal spray Place 2 sprays into both nostrils 2 (two) times daily. Use in each nostril as directed  . benzonatate (TESSALON) 100 MG capsule TAKE 1 CAPSULE (100 MG TOTAL) BY MOUTH 2 (TWO) TIMES DAILY AS NEEDED FOR COUGH.  . budesonide-formoterol  (SYMBICORT) 160-4.5 MCG/ACT inhaler TAKE 2 PUFFS BY MOUTH TWICE A DAY  . budesonide-formoterol (SYMBICORT) 160-4.5 MCG/ACT inhaler Inhale 2 puffs into the lungs in the morning and at bedtime.  . calcium-vitamin D (OSCAL WITH D) 500-200 MG-UNIT tablet Take 1 tablet by mouth 2 (two) times daily.  . dorzolamide (TRUSOPT) 2 % ophthalmic solution 1 drop 2 (two) times daily.  Marland Kitchen gabapentin (NEURONTIN) 100 MG capsule TAKE 1 CAPSULE BY MOUTH THREE TIMES A DAY  . latanoprost (XALATAN) 0.005 % ophthalmic solution Place 1 drop into both eyes at bedtime.  . Multiple Vitamin (MULTIVITAMIN WITH MINERALS) TABS tablet Take 1 tablet by mouth at bedtime.  . risperiDONE (RISPERDAL) 0.25 MG tablet Take 0.25 mg by mouth 2 (two) times daily.  . tamsulosin (FLOMAX) 0.4 MG CAPS capsule Take 1 capsule (0.4 mg total) by mouth daily.  Marland Kitchen venlafaxine XR (EFFEXOR-XR) 75 MG 24 hr capsule TAKE 1 CAPSULE BY MOUTH DAILY WITH BREAKFAST  . vitamin C (ASCORBIC ACID) 500 MG tablet Take 500 mg by mouth at bedtime.   . Vitamin D, Ergocalciferol, (DRISDOL) 1.25 MG (50000 UNIT) CAPS capsule TAKE 1 CAPSULE (50,000 UNITS TOTAL) BY MOUTH EVERY MONDAY. IN THE MORNING  . Wheat Dextrin (BENEFIBER DRINK MIX PO) Take 1 Dose by mouth daily as needed (for regularity). Use as directed daily  . [DISCONTINUED] Brinzolamide-Brimonidine 1-0.2 % SUSP Place 1 drop into both eyes 2 (two) times daily.  (Patient not taking: Reported on 06/27/2020)   No facility-administered encounter medications on file as of 06/27/2020.   Physical Exam: Blood pressure 114/72, pulse 66,  temperature (!) 97 F (36.1 C), temperature source Temporal, height 5\' 10"  (1.778 m), weight 220 lb 3.2 oz (99.9 kg), SpO2 96 %. Gen:      No acute distress HEENT:  EOMI, sclera anicteric Neck:     No masses; no thyromegaly Lungs:    Clear to auscultation bilaterally; normal respiratory effort CV:         Regular rate and rhythm; no murmurs Abd:      + bowel sounds; soft, non-tender; no  palpable masses, no distension Ext:    No edema; adequate peripheral perfusion Skin:      Warm and dry; no rash Neuro: alert and oriented x 3 Psych: normal mood and affect  Data Reviewed: Chest x-ray 06/24/16-no acute cardiopulmonary abnormality. Chest x-ray 03/25/2018-bibasal linear atelectasis I have reviewed the images personally  Echo 06/23/15-  - Mild LVH with LVEF 60-65%. Grade 1 diastolic dysfunction with   increased LV filling pressure. Mild to moderate left atrial   enlargement. Mildly ectatic aortic root. Sclerotic aortic valve   without stenosis. Overall mild aortic regurgitation made up of 2   small jets. Upper normal right atrial chamber size. Trivial   tricuspid regurgitation, unable to assess PASP.  PFTs 09/05/16 FVC 2.90 (84%), FEV1 2.32 (92%), F/F 80, TLC 82%, DLCO 79% Minimal reduction in diffusion capacity.  FENO 09/05/16- 54  CBC with diff 09/05/16- WBC 8.2, 5.4% eos, absolute eso count 460 Blood allergy profile 09/05/16- negative RAST, IgE 150  Assessment:  Mild persistent asthma PFTs which did not show any obstruction. There is some suggestion of small airway disease as he has improvement in his mid flow rates. He does not have emphysema. Likely asthma given high FENO, eos and IgE with recurrent attacks of wheezing  Continue Symbicort, Nasonex as needed for sinus cancer  Dyspnea His increasing dyspnea over 3 weeks may be secondary to heart failure as he also notes abdominal swelling and lower extremity edema We will repeat a chest x-ray today, check proBNP, metabolic panel Start Lasix 20 mg a day  Consider HRCT if chest x-ray continues to show bibasal atelectasis/scarring  Plan/Recommendations: - Continue symbicort 160/4.5, albuterol PRN - Chest x-ray, labs - Lasix 20 mg  Marshell Garfinkel MD [facility] 06/27/2020, 11:58 AM  CC: Arnoldo Lenis, MD

## 2020-06-27 NOTE — Patient Instructions (Addendum)
We will get metabolic panel, proBNP today for dyspnea Check CBC differential, IgE Chest x-ray Start Lasix 20 mg a day  Follow-up in 3 months

## 2020-06-28 ENCOUNTER — Encounter: Payer: Self-pay | Admitting: *Deleted

## 2020-06-28 LAB — PRO B NATRIURETIC PEPTIDE: NT-Pro BNP: 47 pg/mL (ref 0–486)

## 2020-06-29 ENCOUNTER — Other Ambulatory Visit: Payer: Self-pay | Admitting: Family Medicine

## 2020-06-29 LAB — IGE: IgE (Immunoglobulin E), Serum: 165 kU/L — ABNORMAL HIGH

## 2020-07-06 ENCOUNTER — Ambulatory Visit (INDEPENDENT_AMBULATORY_CARE_PROVIDER_SITE_OTHER): Payer: Medicare Other

## 2020-07-06 DIAGNOSIS — R001 Bradycardia, unspecified: Secondary | ICD-10-CM | POA: Diagnosis not present

## 2020-07-06 LAB — CUP PACEART REMOTE DEVICE CHECK
Date Time Interrogation Session: 20220328145908
Implantable Lead Implant Date: 20191216
Implantable Lead Implant Date: 20191216
Implantable Lead Location: 753859
Implantable Lead Location: 753860
Implantable Lead Model: 377
Implantable Lead Model: 377
Implantable Lead Serial Number: 80840322
Implantable Lead Serial Number: 80924555
Implantable Pulse Generator Implant Date: 20191216
Pulse Gen Model: 407145
Pulse Gen Serial Number: 69486427

## 2020-07-19 NOTE — Progress Notes (Signed)
Remote pacemaker transmission.   

## 2020-08-02 DIAGNOSIS — Z23 Encounter for immunization: Secondary | ICD-10-CM | POA: Diagnosis not present

## 2020-08-09 ENCOUNTER — Ambulatory Visit (HOSPITAL_COMMUNITY)
Admission: RE | Admit: 2020-08-09 | Discharge: 2020-08-09 | Disposition: A | Discharge status: Home / Self Care | Payer: Medicare Other | Source: Ambulatory Visit | Attending: Family Medicine | Admitting: Family Medicine

## 2020-08-09 ENCOUNTER — Encounter: Payer: Self-pay | Admitting: Family Medicine

## 2020-08-09 ENCOUNTER — Other Ambulatory Visit: Payer: Self-pay

## 2020-08-09 ENCOUNTER — Ambulatory Visit (INDEPENDENT_AMBULATORY_CARE_PROVIDER_SITE_OTHER): Payer: Medicare Other | Admitting: Family Medicine

## 2020-08-09 ENCOUNTER — Encounter: Payer: Self-pay | Admitting: Internal Medicine

## 2020-08-09 VITALS — BP 101/61 | HR 65 | Resp 15 | Ht 70.0 in | Wt 214.1 lb

## 2020-08-09 DIAGNOSIS — R0602 Shortness of breath: Secondary | ICD-10-CM | POA: Insufficient documentation

## 2020-08-09 DIAGNOSIS — R109 Unspecified abdominal pain: Secondary | ICD-10-CM | POA: Diagnosis not present

## 2020-08-09 DIAGNOSIS — R630 Anorexia: Secondary | ICD-10-CM

## 2020-08-09 DIAGNOSIS — E669 Obesity, unspecified: Secondary | ICD-10-CM

## 2020-08-09 DIAGNOSIS — R1013 Epigastric pain: Secondary | ICD-10-CM

## 2020-08-09 DIAGNOSIS — F322 Major depressive disorder, single episode, severe without psychotic features: Secondary | ICD-10-CM | POA: Diagnosis not present

## 2020-08-09 MED ORDER — OMEPRAZOLE 40 MG PO CPDR: 40.0000 mg | DELAYED_RELEASE_CAPSULE | Freq: Every day | ORAL | 1 refills | Status: DC

## 2020-08-09 NOTE — Assessment & Plan Note (Signed)
No objective corroborations at visit EKG : unchanged since Jan 2022 when he saw Cardiology

## 2020-08-09 NOTE — Assessment & Plan Note (Signed)
  Patient re-educated about  the importance of commitment to a  minimum of 150 minutes of exercise per week as able.  The importance of healthy food choices with portion control discussed, as well as eating regularly and within a 12 hour window most days. The need to choose "clean , green" food 50 to 75% of the time is discussed, as well as to make water the primary drink and set a goal of 64 ounces water daily.    Weight /BMI 08/09/2020 06/27/2020 06/14/2020  WEIGHT 214 lb 1.9 oz 220 lb 3.2 oz 219 lb 12.8 oz  HEIGHT 5\' 10"  5\' 10"  5\' 10"   BMI 30.72 kg/m2 31.6 kg/m2 31.54 kg/m2

## 2020-08-09 NOTE — Assessment & Plan Note (Signed)
5 day history , new symptom, constipation. Breath test, x ray, PPI and GI referral

## 2020-08-09 NOTE — Assessment & Plan Note (Signed)
Inadequately controlled, not suicidal or homicidal, treated by the [place]

## 2020-08-09 NOTE — Progress Notes (Signed)
   Terry Love     MRN: [id number]      DOB: Oct 03, 1935   HPI Terry Love is here5 day h/o epigastric and substernal pain with eating, poor appetite and reduced stool frequency, also notes bloating C/o shortness of breath with activity and chest tightness when he walks at times ROS Denies recent fever or chills. Denies sinus pressure, nasal congestion, ear pain or sore throat. Denies chest congestion, productive cough or wheezing. Denies palpitations and leg swelling Denies , nausea, vomiting,diarrhea .   Denies dysuria, frequency, hesitancy or incontinence. Denies joint pain, swelling and limitation in mobility. Denies headaches, seizures, numbness, or tingling. C/o  depression, anxiety or insomnia. Denies skin break down or rash.   PE  BP 101/61   Pulse 65   Resp 15   Ht 5\' 10"  (1.778 m)   Wt 214 lb 1.9 oz (97.1 kg)   SpO2 94%   BMI 30.72 kg/m   With ambulation oxygen 96% on room air  Patient alert and oriented and in no cardiopulmonary distress.  HEENT: No facial asymmetry, EOMI,     Neck supple .  Chest: Clear to auscultation bilaterally.  CVS: S1, S2 no murmurs, no S3.Regular rate. EKG: rate 63, minimal lVH, old infarct, no change since Jan 2022 ABD: Soft mild epigastric tenderness, no organomegaly , possible  Midline hernia.   Ext: No edema  MS: Adequate ROM spine, shoulders, hips and knees.  Skin: Intact, no ulcerations or rash noted.  Psych: Good eye contact, normal affect. Memory impaired not anxious or depressed appearing.  CNS: CN 2-12 intact, marked hearing loss, power,  normal throughout.no focal deficits noted.   Assessment & Plan  Abdominal pain 5 day history , new symptom, constipation. Breath test, x ray, PPI and GI referral  Depression, major, single episode, severe ([place]) Inadequately controlled, not suicidal or homicidal, [facility]  Shortness of breath No objective corroborations at visit EKG : unchanged since Jan 2022 when  he saw Cardiology  Obesity (BMI 30.0-34.9)  Patient re-educated about  the importance of commitment to a  minimum of 150 minutes of exercise per week as able.  The importance of healthy food choices with portion control discussed, as well as eating regularly and within a 12 hour window most days. The need to choose "clean , green" food 50 to 75% of the time is discussed, as well as to make water the primary drink and set a goal of 64 ounces water daily.    Weight /BMI 08/09/2020 06/27/2020 06/14/2020  WEIGHT 214 lb 1.9 oz 220 lb 3.2 oz 219 lb 12.8 oz  HEIGHT 5\' 10"  5\' 10"  5\' 10"   BMI 30.72 kg/m2 31.6 kg/m2 31.54 kg/m2

## 2020-08-09 NOTE — Patient Instructions (Signed)
Please keep July appointment, call if you need me sooner  Breath test today  EKG is unchanged since January, so that is good  Oxygen level is excellent , even when you walk it is around 96%. So I recommend starting walking for short spells like 5 minutes 3 times per day to build up your endurance  Please get breath test today in lab  X ray of chest and abdomen today at hospital  nw medication omeprazole one daily for pain with eating  You are referred to Dr Sydell Axon for furthers evaluation  It is important that you exercise regularly at least 30 minutes 5 times a week. If you develop chest pain, have severe difficulty breathing, or feel very tired, stop exercising immediately and seek medical attention  Thanks for choosing [facility], we consider it a privelige to serve you.

## 2020-08-31 ENCOUNTER — Other Ambulatory Visit: Payer: Self-pay | Admitting: Family Medicine

## 2020-09-07 ENCOUNTER — Encounter: Payer: Self-pay | Admitting: Internal Medicine

## 2020-09-07 ENCOUNTER — Other Ambulatory Visit: Payer: Self-pay

## 2020-09-07 ENCOUNTER — Ambulatory Visit (INDEPENDENT_AMBULATORY_CARE_PROVIDER_SITE_OTHER): Payer: Medicare Other | Admitting: Internal Medicine

## 2020-09-07 VITALS — BP 98/60 | HR 64 | Ht 70.0 in | Wt 216.6 lb

## 2020-09-07 DIAGNOSIS — I495 Sick sinus syndrome: Secondary | ICD-10-CM | POA: Diagnosis not present

## 2020-09-07 MED ORDER — FUROSEMIDE 40 MG PO TABS: 40.0000 mg | ORAL_TABLET | Freq: Every day | ORAL | 3 refills | Status: DC

## 2020-09-07 NOTE — Progress Notes (Signed)
HPI Terry Love returns today for followup. He is a pleasant 85 yo man with a h/o HTN and COPD who was found to have chronotropic incompetence and underwent PPM insertion. In the interim, he has still had some dyspnea/fatigue with exertion. No PND or orthopnea. No edema. He admits to being sedentary. He c/o feeling sob. He has been on only low dose lasix.  No Known Allergies   Current Outpatient Medications  Medication Sig Dispense Refill  . albuterol (VENTOLIN HFA) 108 (90 Base) MCG/ACT inhaler INHALE 2 PUFFS INTO THE LUNGS EVERY 4 (FOUR) HOURS AS NEEDED FOR WHEEZING OR SHORTNESS OF BREATH 18 g 11  . aspirin 81 MG EC tablet Take 81 mg by mouth at bedtime.    Marland Kitchen atorvastatin (LIPITOR) 40 MG tablet Take 1 tablet (40 mg total) by mouth at bedtime. 90 tablet 3  . azelastine (ASTELIN) 0.1 % nasal spray Place 2 sprays into both nostrils 2 (two) times daily. Use in each nostril as directed 30 mL 12  . budesonide-formoterol (SYMBICORT) 160-4.5 MCG/ACT inhaler Inhale 2 puffs into the lungs in the morning and at bedtime. 1 each 6  . calcium-vitamin D (OSCAL WITH D) 500-200 MG-UNIT tablet Take 1 tablet by mouth 2 (two) times daily. 150 tablet 1  . dorzolamide (TRUSOPT) 2 % ophthalmic solution 1 drop 2 (two) times daily.    . furosemide (LASIX) 20 MG tablet Take 1 tablet (20 mg total) by mouth daily. 30 tablet 2  . gabapentin (NEURONTIN) 100 MG capsule TAKE 1 CAPSULE BY MOUTH THREE TIMES A DAY 90 capsule 1  . latanoprost (XALATAN) 0.005 % ophthalmic solution Place 1 drop into both eyes at bedtime.    . Multiple Vitamin (MULTIVITAMIN WITH MINERALS) TABS tablet Take 1 tablet by mouth at bedtime.    Marland Kitchen omeprazole (PRILOSEC) 40 MG capsule TAKE 1 CAPSULE (40 MG TOTAL) BY MOUTH DAILY. 30 capsule 1  . risperiDONE (RISPERDAL) 0.25 MG tablet Take 0.25 mg by mouth 2 (two) times daily.    . tamsulosin (FLOMAX) 0.4 MG CAPS capsule Take 1 capsule (0.4 mg total) by mouth daily. 90 capsule 3  . venlafaxine XR  (EFFEXOR-XR) 75 MG 24 hr capsule TAKE 1 CAPSULE BY MOUTH DAILY WITH BREAKFAST 90 capsule 2  . vitamin C (ASCORBIC ACID) 500 MG tablet Take 500 mg by mouth at bedtime.     . Vitamin D, Ergocalciferol, (DRISDOL) 1.25 MG (50000 UNIT) CAPS capsule TAKE 1 CAPSULE (50,000 UNITS TOTAL) BY MOUTH EVERY MONDAY. IN THE MORNING 12 capsule 1  . Wheat Dextrin (BENEFIBER DRINK MIX PO) Take 1 Dose by mouth daily as needed (for regularity). Use as directed daily     No current facility-administered medications for this visit.     Past Medical History:  Diagnosis Date  . Anxiety   . Asthma   . Bradycardia    Chronic   . Colon polyps   . Depression   . Facial tic   . Glaucoma   . Hearing loss   . Heart disease   . High cholesterol   . Hyperlipidemia   . 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 Obesity   . Seasonal allergies     ROS:   All systems reviewed and negative except as noted in the HPI.   Past Surgical History:  Procedure Laterality Date  . carpal tunnel  release right  2006  . COLONOSCOPY    . COLONOSCOPY  01/25/2011   Dr. Rehman:Examination  performed to cecum Pan colonic diverticulosis/2 small polyps ablated via cold biopsy from transverse colon/External hemorrhoids, benign polyps  . COLONOSCOPY N/A 06/29/2013   Procedure: COLONOSCOPY;  Surgeon: Daneil Dolin, MD;  [facility];  Service: Endoscopy;  Laterality: N/A;  10:45  . EYE SURGERY     left eye cataracts   . PACEMAKER IMPLANT N/A 03/24/2018   Procedure: PACEMAKER IMPLANT;  Surgeon: Evans Lance, MD;  Location: [facility];  Service: Cardiovascular;  Laterality: N/A;     Family History  Problem Relation Age of Onset  . Colon cancer Mother        diagnosed at age 53  . Aneurysm Father   . Aneurysm Brother   . Stroke Brother      Social History   Socioeconomic History  . Marital status: Married    Spouse name: Terry Love   . Number of children: 0  . Years of education: 55   . Highest education level: 12th grade  Occupational History  . Occupation: retired  Tobacco Use  . Smoking status: Never Smoker  . Smokeless tobacco: Former Systems developer    Types: Secondary school teacher  . Vaping Use: Never used  Substance and Sexual Activity  . Alcohol use: No    Alcohol/week: 0.0 standard drinks  . Drug use: No  . Sexual activity: Not Currently  Other Topics Concern  . Not on file  Social History Narrative  . Not on file   Social Determinants of Health   Financial Resource Strain: Low Risk   . Difficulty of Paying Living Expenses: Not hard at all  Food Insecurity: No Food Insecurity  . Worried About Charity fundraiser in the Last Year: Never true  . Ran Out of Food in the Last Year: Never true  Transportation Needs: No Transportation Needs  . Lack of Transportation (Medical): No  . Lack of Transportation (Non-Medical): No  Physical Activity: Inactive  . Days of Exercise per Week: 0 days  . Minutes of Exercise per Session: 0 min  Stress: Stress Concern Present  . Feeling of Stress : To some extent  Social Connections: Moderately Integrated  . Frequency of Communication with Friends and Family: Three times a week  . Frequency of Social Gatherings with Friends and Family: Once a week  . Attends Religious Services: More than 4 times per year  . Active Member of Clubs or Organizations: No  . Attends Archivist Meetings: Never  . Marital Status: Married  Human resources officer Violence: Not At Risk  . Fear of Current or Ex-Partner: No  . Emotionally Abused: No  . Physically Abused: No  . Sexually Abused: No     BP 98/60   Pulse 64   Ht 5\' 10"  (1.778 m)   Wt 216 lb 9.6 oz (98.2 kg)   SpO2 98%   BMI 31.08 kg/m   Physical Exam:  Well appearing 85 yo man, NAD HEENT: Unremarkable Neck:  7 cm JVD, no thyromegally Lymphatics:  No adenopathy Back:  No CVA tenderness Lungs:  Basilar rales but no increased work of breathing. HEART:  Regular rate rhythm, no  murmurs, no rubs, no clicks Abd:  soft, positive bowel sounds, no organomegally, no rebound, no guarding Ext:  2 plus pulses, no edema, no cyanosis, no clubbing Skin:  No rashes no nodules Neuro:  CN II through XII intact, motor grossly intact  DEVICE  Normal device function.  See PaceArt for details.   Assess/Plan:  1. Chronic diastolic heart failure - This is his biggest problem. I have asked him to increase the lasix to 40 mg daily after taking 60 mg a day for 3 days. 2. Sinus node dysfunction - he is s/p PPM insertion. He is asymptomatic. 3. Obesity - he is encouraged to lose weight.  4. PPM - his biotronik DDD PM is working normally. He is pacing 98% of the time in the atrium and almost none in the ventricle due to sinus node dysfunction.  Carleene Overlie Nathaniel Yaden,MD

## 2020-09-07 NOTE — Patient Instructions (Signed)
Medication Instructions:  Your physician has recommended you make the following change in your medication:  TAKE 60 mg (3 tablets) of Furosemide 20 mg three (3) times for three (3) days, then take 40mg  once daily (new prescription).   *If you need a refill on your cardiac medications before your next appointment, please call your pharmacy*   Lab Work: None If you have labs (blood work) drawn today and your tests are completely normal, you will receive your results only by: Marland Kitchen MyChart Message (if you have MyChart) OR . A paper copy in the mail If you have any lab test that is abnormal or we need to change your treatment, we will call you to review the results.   Testing/Procedures: None   Follow-Up: At [facility], you and your health needs are our priority.  As part of our continuing mission to provide you with exceptional heart care, we have created designated Provider Care Teams.  These Care Teams include your primary Cardiologist (physician) and Advanced Practice Providers (APPs -  Physician Assistants and Nurse Practitioners) who all work together to provide you with the care you need, when you need it.  We recommend signing up for the patient portal called "MyChart".  Sign up information is provided on this After Visit Summary.  MyChart is used to connect with patients for Virtual Visits (Telemedicine).  Patients are able to view lab/test results, encounter notes, upcoming appointments, etc.  Non-urgent messages can be sent to your provider as well.   To learn more about what you can do with MyChart, go to [email or web address].    Your next appointment:   12 month(s)  The format for your next appointment:   In Person  Provider:   Cristopher Peru, MD   Other Instructions 3 month follow up with Zandra Abts, MD

## 2020-09-21 ENCOUNTER — Other Ambulatory Visit: Payer: Self-pay | Admitting: Pulmonary Disease

## 2020-09-22 ENCOUNTER — Other Ambulatory Visit (HOSPITAL_COMMUNITY): Payer: Self-pay | Admitting: Internal Medicine

## 2020-09-22 DIAGNOSIS — R413 Other amnesia: Secondary | ICD-10-CM

## 2020-09-25 ENCOUNTER — Other Ambulatory Visit: Payer: Self-pay | Admitting: Family Medicine

## 2020-09-28 ENCOUNTER — Other Ambulatory Visit (HOSPITAL_COMMUNITY): Payer: Self-pay | Admitting: Internal Medicine

## 2020-09-28 DIAGNOSIS — K760 Fatty (change of) liver, not elsewhere classified: Secondary | ICD-10-CM

## 2020-10-03 ENCOUNTER — Other Ambulatory Visit: Payer: Self-pay

## 2020-10-05 ENCOUNTER — Ambulatory Visit (INDEPENDENT_AMBULATORY_CARE_PROVIDER_SITE_OTHER): Payer: Medicare Other

## 2020-10-05 DIAGNOSIS — I495 Sick sinus syndrome: Secondary | ICD-10-CM | POA: Diagnosis not present

## 2020-10-05 LAB — CUP PACEART REMOTE DEVICE CHECK
Date Time Interrogation Session: 20220627115615
Implantable Lead Implant Date: 20191216
Implantable Lead Implant Date: 20191216
Implantable Lead Location: 753859
Implantable Lead Location: 753860
Implantable Lead Model: 377
Implantable Lead Model: 377
Implantable Lead Serial Number: 80840322
Implantable Lead Serial Number: 80924555
Implantable Pulse Generator Implant Date: 20191216
Pulse Gen Model: 407145
Pulse Gen Serial Number: 69486427

## 2020-10-06 ENCOUNTER — Ambulatory Visit (HOSPITAL_COMMUNITY)
Admission: RE | Admit: 2020-10-06 | Discharge: 2020-10-06 | Disposition: A | Discharge status: Home / Self Care | Payer: Medicare Other | Source: Ambulatory Visit | Attending: Internal Medicine | Admitting: Internal Medicine

## 2020-10-06 ENCOUNTER — Other Ambulatory Visit: Payer: Self-pay

## 2020-10-06 DIAGNOSIS — K760 Fatty (change of) liver, not elsewhere classified: Secondary | ICD-10-CM | POA: Insufficient documentation

## 2020-10-06 DIAGNOSIS — K7689 Other specified diseases of liver: Secondary | ICD-10-CM | POA: Diagnosis not present

## 2020-10-07 ENCOUNTER — Ambulatory Visit (HOSPITAL_COMMUNITY): Admission: RE | Admit: 2020-10-07 | Payer: Medicare Other | Source: Ambulatory Visit

## 2020-10-08 DIAGNOSIS — Z20822 Contact with and (suspected) exposure to covid-19: Secondary | ICD-10-CM | POA: Diagnosis not present

## 2020-10-19 ENCOUNTER — Other Ambulatory Visit: Payer: Self-pay

## 2020-10-19 ENCOUNTER — Ambulatory Visit: Payer: Medicare Other | Admitting: Urology

## 2020-10-19 ENCOUNTER — Ambulatory Visit (INDEPENDENT_AMBULATORY_CARE_PROVIDER_SITE_OTHER): Payer: Medicare Other | Admitting: Family Medicine

## 2020-10-19 ENCOUNTER — Encounter: Payer: Self-pay | Admitting: Family Medicine

## 2020-10-19 VITALS — BP 110/65 | HR 68 | Temp 98.4°F | Resp 18 | Ht 70.0 in | Wt 221.0 lb

## 2020-10-19 DIAGNOSIS — E785 Hyperlipidemia, unspecified: Secondary | ICD-10-CM | POA: Diagnosis not present

## 2020-10-19 DIAGNOSIS — D649 Anemia, unspecified: Secondary | ICD-10-CM | POA: Diagnosis not present

## 2020-10-19 DIAGNOSIS — E669 Obesity, unspecified: Secondary | ICD-10-CM | POA: Diagnosis not present

## 2020-10-19 DIAGNOSIS — J302 Other seasonal allergic rhinitis: Secondary | ICD-10-CM

## 2020-10-19 DIAGNOSIS — R0602 Shortness of breath: Secondary | ICD-10-CM | POA: Diagnosis not present

## 2020-10-19 DIAGNOSIS — F322 Major depressive disorder, single episode, severe without psychotic features: Secondary | ICD-10-CM | POA: Diagnosis not present

## 2020-10-19 NOTE — Assessment & Plan Note (Addendum)
Subjective report, however clinical exam normal, and when  Pt was ambulated, pulse ox was 95 % on room air, has upcoming f/u with pulmonary

## 2020-10-19 NOTE — Assessment & Plan Note (Signed)
Improved slightly continue Psych management

## 2020-10-19 NOTE — Progress Notes (Signed)
Remote pacemaker transmission.   

## 2020-10-19 NOTE — Patient Instructions (Addendum)
Follow-up in 4.5  months call if you need me sooner.  We will check your oxygen level today while walking and once your level stays over 90% on room air then please do start committing to walking for 5 to 10 minutes 2-3 times inside or outside of the house at least 5 days/week.  Please get fasting lipid CMP and EGFR CBC drawn in the next 1 to 2 weeks.  At checkout please see if you you are able to change his follow-up pulmonary appointment of provider in [place].  He is already an established patient with lumbar pulmonary and has an appointment with one of the providers who does not come to [place] scheduled for July 28.  If it is possible to change his appointment to a local provider please do so.  If not then he stays with the provider in [facility]

## 2020-10-19 NOTE — Assessment & Plan Note (Signed)
Hyperlipidemia:Low fat diet discussed and encouraged.   Lipid Panel  Lab Results  Component Value Date   CHOL 215 (H) 05/11/2020   HDL 52 05/11/2020   LDLCALC 149 (H) 05/11/2020   TRIG 76 05/11/2020   CHOLHDL 4.1 05/11/2020     Updated lab needed at/ before next visit.

## 2020-10-19 NOTE — Assessment & Plan Note (Signed)
  Patient re-educated about  the importance of commitment to a  minimum of 150 minutes of exercise per week as able.  The importance of healthy food choices with portion control discussed, as well as eating regularly and within a 12 hour window most days. The need to choose "clean , green" food 50 to 75% of the time is discussed, as well as to make water the primary drink and set a goal of 64 ounces water daily.    Weight /BMI 10/19/2020 09/07/2020 08/09/2020  WEIGHT 221 lb 216 lb 9.6 oz 214 lb 1.9 oz  HEIGHT 5\' 10"  5\' 10"  5\' 10"   BMI 31.71 kg/m2 31.08 kg/m2 30.72 kg/m2

## 2020-10-19 NOTE — Progress Notes (Signed)
Terry Love     MRN: [id number]      DOB: [birth year]   HPI Terry Love is here for follow up and re-evaluation of chronic medical conditions, medication management and review of any available recent lab and radiology data.  Preventive health is updated, specifically  Cancer screening and Immunization.   Questions or concerns regarding consultations or procedures which the PT has had in the interim are  addressed. The PT denies any adverse reactions to current medications since the last visit.  6 month h/o increased shortness of breath an states not exercising or moving around much as a result of this C/o chronic uncontrolled depression, states unable to do the things he used to and often wonders if he would not be better offf not living, not suicidal or  homicidal and followed by Psych through the [place] , also has therapy  ROS Denies recent fever or chills. Denies sinus pressure, nasal congestion, ear pain or sore throat. Denies chest congestion,  or wheezing.Gas slight non productive cough , since last Fall unchanged Denies chest pains, palpitations and leg swelling Denies abdominal pain, nausea, vomiting,diarrhea or constipation.   Denies dysuria, frequency, hesitancy or incontinence. Denies joint pain, swelling and limitation in mobility. Denies headaches, seizures, numbness, or tingling. . Denies skin break down or rash.   PE  BP 110/65 (BP Location: Right Arm, Patient Position: Sitting, Cuff Size: Large)   Pulse 68   Temp 98.4 F (36.9 C)   Resp 18   Ht 5\' 10"  (1.778 m)   Wt 221 lb (100.2 kg)   SpO2 94%   BMI 31.71 kg/m   Patient alert and oriented and in no cardiopulmonary distress.  HEENT: No facial asymmetry, EOMI,     Neck supple .  Chest: Clear to auscultation bilaterally.  CVS: S1, S2 no murmurs, no S3.Regular rate.  ABD: Soft non tender.   Ext: No edema  MS: decreased  ROM spine, adequate in shoulders, hips and knees.  Skin: Intact, no ulcerations or  rash noted.  Psych: poor  eye contact, flat  affect depressed appearing.  CNS: CN 2-12 intact, power,  normal throughout.no focal deficits noted.   Assessment & Plan  Shortness of breath Subjective report, however clinical exam normal, and when  Pt was ambulated, pulse ox was 95 % on room air, has upcoming f/u with pulmonary  Obesity (BMI 30.0-34.9)  Patient re-educated about  the importance of commitment to a  minimum of 150 minutes of exercise per week as able.  The importance of healthy food choices with portion control discussed, as well as eating regularly and within a 12 hour window most days. The need to choose "clean , green" food 50 to 75% of the time is discussed, as well as to make water the primary drink and set a goal of 64 ounces water daily.    Weight /BMI 10/19/2020 09/07/2020 08/09/2020  WEIGHT 221 lb 216 lb 9.6 oz 214 lb 1.9 oz  HEIGHT 5\' 10"  5\' 10"  5\' 10"   BMI 31.71 kg/m2 31.08 kg/m2 30.72 kg/m2      Hyperlipidemia LDL goal <100 Hyperlipidemia:Low fat diet discussed and encouraged.   Lipid Panel  Lab Results  Component Value Date   CHOL 215 (H) 05/11/2020   HDL 52 05/11/2020   LDLCALC 149 (H) 05/11/2020   TRIG 76 05/11/2020   CHOLHDL 4.1 05/11/2020     Updated lab needed at/ before next visit.   Depression, major, single episode, severe ([place]) Improved  slightly continue Psych management  Allergic rhinitis Controlled, no change in medication

## 2020-10-19 NOTE — Assessment & Plan Note (Signed)
Controlled, no change in medication  

## 2020-10-20 ENCOUNTER — Other Ambulatory Visit: Payer: Self-pay | Admitting: Family Medicine

## 2020-10-20 ENCOUNTER — Other Ambulatory Visit (HOSPITAL_COMMUNITY): Payer: Self-pay | Admitting: Internal Medicine

## 2020-10-20 DIAGNOSIS — K760 Fatty (change of) liver, not elsewhere classified: Secondary | ICD-10-CM

## 2020-10-21 ENCOUNTER — Other Ambulatory Visit: Payer: Self-pay

## 2020-10-21 ENCOUNTER — Encounter: Payer: Self-pay | Admitting: Urology

## 2020-10-21 ENCOUNTER — Ambulatory Visit (INDEPENDENT_AMBULATORY_CARE_PROVIDER_SITE_OTHER): Payer: Medicare Other | Admitting: Urology

## 2020-10-21 VITALS — BP 110/72 | HR 70 | Temp 98.8°F

## 2020-10-21 DIAGNOSIS — N401 Enlarged prostate with lower urinary tract symptoms: Secondary | ICD-10-CM | POA: Diagnosis not present

## 2020-10-21 DIAGNOSIS — N138 Other obstructive and reflux uropathy: Secondary | ICD-10-CM | POA: Diagnosis not present

## 2020-10-21 DIAGNOSIS — R3912 Poor urinary stream: Secondary | ICD-10-CM

## 2020-10-21 DIAGNOSIS — R351 Nocturia: Secondary | ICD-10-CM

## 2020-10-21 LAB — URINALYSIS, ROUTINE W REFLEX MICROSCOPIC
Bilirubin, UA: NEGATIVE
Glucose, UA: NEGATIVE
Ketones, UA: NEGATIVE
Leukocytes,UA: NEGATIVE
Nitrite, UA: NEGATIVE
Protein,UA: NEGATIVE
RBC, UA: NEGATIVE
Specific Gravity, UA: 1.025 (ref 1.005–1.030)
Urobilinogen, Ur: 0.2 mg/dL (ref 0.2–1.0)
pH, UA: 6 (ref 5.0–7.5)

## 2020-10-21 MED ORDER — TAMSULOSIN HCL 0.4 MG PO CAPS: 0.4000 mg | ORAL_CAPSULE | Freq: Every day | ORAL | 3 refills | Status: DC

## 2020-10-21 NOTE — Progress Notes (Signed)
10/21/2020 2:56 PM   Terry Love 01/27/1936 [id number]  Referring provider: Fayrene Helper, MD [address], [place],  [place] [address]  Followup BPH   HPI: Terry Love is a 85yo here for followup for BPh with nocturia and weak urinary stream. IPSS 3 QOL 1. He is on flomax 0.4mg . He is happy with his urination.    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 Rehman:Examination performed to cecum Pan colonic diverticulosis/2 small polyps ablated via cold biopsy from transverse colon/External hemorrhoids, benign polyps   COLONOSCOPY N/A 06/29/2013   Procedure: COLONOSCOPY;  Surgeon: Daneil Dolin, MD;  [facility];  Service: Endoscopy;  Laterality: N/A;  10:45   EYE SURGERY     left eye cataracts    PACEMAKER IMPLANT N/A 03/24/2018   Procedure: PACEMAKER IMPLANT;  Surgeon: Evans Lance, MD;  Location: [facility];  Service: Cardiovascular;  Laterality: N/A;    Home Medications:  Allergies as of 10/21/2020   No Known Allergies      Medication List        Accurate as of October 21, 2020  2:56 PM. If you have any questions, ask your nurse or doctor.          albuterol 108 (90 Base) MCG/ACT inhaler Commonly known as: Ventolin HFA INHALE 2 PUFFS INTO THE LUNGS EVERY 4 (FOUR) HOURS AS NEEDED FOR WHEEZING OR SHORTNESS OF BREATH   aspirin 81 MG EC tablet Take 81 mg by mouth at bedtime.   atorvastatin 40 MG tablet Commonly known as: LIPITOR Take 1 tablet (40 mg total) by mouth at bedtime.   azelastine  0.1 % nasal spray Commonly known as: ASTELIN Place 2 sprays into both nostrils 2 (two) times daily. Use in each nostril as directed   BENEFIBER DRINK MIX PO Take 1 Dose by mouth daily as needed (for regularity). Use as directed daily   budesonide-formoterol 160-4.5 MCG/ACT inhaler Commonly known as: Symbicort Inhale 2 puffs into the lungs in the morning and at bedtime.   calcium-vitamin D 500-200 MG-UNIT tablet Commonly known as: OSCAL WITH D Take 1 tablet by mouth 2 (two) times daily.   dorzolamide 2 % ophthalmic solution Commonly known as: TRUSOPT 1 drop 2 (two) times daily.   furosemide 40 MG tablet Commonly known as: LASIX Take 1 tablet (40 mg total) by mouth daily.   furosemide 20 MG tablet Commonly known as: LASIX TAKE 1 TABLET BY MOUTH EVERY DAY   gabapentin 100 MG capsule Commonly known as: NEURONTIN TAKE 1 CAPSULE BY MOUTH THREE TIMES A DAY   latanoprost 0.005 % ophthalmic solution Commonly known as: XALATAN Place 1 drop into both eyes at bedtime.   multivitamin with minerals Tabs tablet Take 1 tablet by mouth at bedtime.   omeprazole 40 MG capsule Commonly known as: PRILOSEC TAKE 1 CAPSULE (40 MG  TOTAL) BY MOUTH DAILY.   sertraline 25 MG tablet Commonly known as: ZOLOFT Take 25 mg by mouth daily.   tamsulosin 0.4 MG Caps capsule Commonly known as: FLOMAX Take 1 capsule (0.4 mg total) by mouth daily.   venlafaxine XR 75 MG 24 hr capsule Commonly known as: EFFEXOR-XR TAKE 1 CAPSULE BY MOUTH DAILY WITH BREAKFAST   vitamin C 500 MG tablet Commonly known as: ASCORBIC ACID Take 500 mg by mouth at bedtime.   Vitamin D (Ergocalciferol) 1.25 MG (50000 UNIT) Caps capsule Commonly known as: DRISDOL TAKE 1 CAPSULE (50,000 UNITS TOTAL) BY MOUTH EVERY MONDAY. IN THE MORNING        Allergies: No Known Allergies  Family History: Family History  Problem Relation Age of Onset   Colon cancer Mother        diagnosed at age 65   Aneurysm Father     Aneurysm Brother    Stroke Brother     Social History:  reports that he has never smoked. He quit smokeless tobacco use about 4 years ago.  His smokeless tobacco use included chew. He reports that he does not drink alcohol and does not use drugs.  ROS: All other review of systems were reviewed and are negative except what is noted above in HPI  Physical Exam: BP 110/72   Pulse 70   Temp 98.8 F (37.1 C)   Constitutional:  Alert and oriented, No acute distress. HEENT: [place] AT, moist mucus membranes.  Trachea midline, no masses. Cardiovascular: No clubbing, cyanosis, or edema. Respiratory: Normal respiratory effort, no increased work of breathing. GI: Abdomen is soft, nontender, nondistended, no abdominal masses GU: No CVA tenderness.  Lymph: No cervical or inguinal lymphadenopathy. Skin: No rashes, bruises or suspicious lesions. Neurologic: Grossly intact, no focal deficits, moving all 4 extremities. Psychiatric: Normal mood and affect.  Laboratory Data: Lab Results  Component Value Date   WBC 5.3 06/27/2020   HGB 12.6 (L) 06/27/2020   HCT 37.6 (L) 06/27/2020   MCV 91.2 06/27/2020   PLT 258.0 06/27/2020    Lab Results  Component Value Date   CREATININE 0.73 06/27/2020    Lab Results  Component Value Date   PSA <0.1 12/01/2019   PSA 0.2 09/08/2018   PSA 0.1 09/03/2017    No results found for: TESTOSTERONE  Lab Results  Component Value Date   HGBA1C 5.3 07/31/2018    Urinalysis    Component Value Date/Time   APPEARANCEUR Clear 04/19/2020 1318   GLUCOSEU Negative 04/19/2020 1318   BILIRUBINUR Negative 04/19/2020 1318   PROTEINUR Negative 04/19/2020 1318   UROBILINOGEN 0.2 11/24/2012 1350   NITRITE Negative 04/19/2020 1318   LEUKOCYTESUR Negative 04/19/2020 1318    Lab Results  Component Value Date   LABMICR Comment 04/19/2020    Pertinent Imaging:  No results found for this or any previous visit.  No results found for this or any previous  visit.  No results found for this or any previous visit.  No results found for this or any previous visit.  No results found for this or any previous visit.  No results found for this or any previous visit.  No results found for this or any previous visit.  No results found for this or any previous visit.   Assessment & Plan:    1. Benign prostatic hyperplasia with urinary obstruction Continue flomax 0.4mg  daily - Urinalysis, Routine w reflex microscopic  2. Nocturia -Continue flomax   3. Weak urinary stream Continue flomax  No follow-ups on file.  Nicolette Bang, MD  [facility] [place]

## 2020-10-21 NOTE — Addendum Note (Signed)
Addended by: Iris Pert on: 10/21/2020 04:01 PM   Modules accepted: Orders

## 2020-10-21 NOTE — Progress Notes (Signed)
Urological Symptom Review  Patient is experiencing the following symptoms: Stream starts and stops Trouble starting stream Weak stream   Review of Systems  Gastrointestinal (upper)  : Indigestion/heartburn  Gastrointestinal (lower) : Negative for lower GI symptoms  Constitutional : Fatigue  Skin: Negative for skin symptoms  Eyes: Negative for eye symptoms  Ear/Nose/Throat : Sinus problems  Hematologic/Lymphatic: Negative for Hematologic/Lymphatic symptoms  Cardiovascular : Negative for cardiovascular symptoms  Respiratory : Shortness of breath  Endocrine: Negative for endocrine symptoms  Musculoskeletal: Negative for musculoskeletal symptoms  Neurological: Negative for neurological symptoms  Psychologic: Depression Anxiety

## 2020-10-21 NOTE — Patient Instructions (Signed)
Benign Prostatic Hyperplasia  Benign prostatic hyperplasia (BPH) is an enlarged prostate gland that is caused by the normal aging process and not by cancer. The prostate is a walnut-sized gland that is involved in the production of semen. It is located in front of the rectum and below the bladder. The bladder stores urine and the urethra is the tube that carries the urine out of the body. The prostate may get bigger asa man gets older. An enlarged prostate can press on the urethra. This can make it harder to pass urine. The build-up of urine in the bladder can cause infection. Back pressure and infection may progress to bladder damage and kidney (renal) failure. What are the causes? This condition is part of a normal aging process. However, not all men develop problems from this condition. If the prostate enlarges away from the urethra, urine flow will not be blocked. If it enlarges toward the urethra andcompresses it, there will be problems passing urine. What increases the risk? This condition is more likely to develop in men over the age of 50 years. What are the signs or symptoms? Symptoms of this condition include: Getting up often during the night to urinate. Needing to urinate frequently during the day. Difficulty starting urine flow. Decrease in size and strength of your urine stream. Leaking (dribbling) after urinating. Inability to pass urine. This needs immediate treatment. Inability to completely empty your bladder. Pain when you pass urine. This is more common if there is also an infection. Urinary tract infection (UTI). How is this diagnosed? This condition is diagnosed based on your medical history, a physical exam, and your symptoms. Tests will also be done, such as: A post-void bladder scan. This measures any amount of urine that may remain in your bladder after you finish urinating. A digital rectal exam. In a rectal exam, your health care provider checks your prostate by  putting a lubricated, gloved finger into your rectum to feel the back of your prostate gland. This exam detects the size of your gland and any abnormal lumps or growths. An exam of your urine (urinalysis). A prostate specific antigen (PSA) screening. This is a blood test used to screen for prostate cancer. An ultrasound. This test uses sound waves to electronically produce a picture of your prostate gland. Your health care provider may refer you to a specialist in kidney and prostate diseases (urologist). How is this treated? Once symptoms begin, your health care provider will monitor your condition (active surveillance or watchful waiting). Treatment for this condition will depend on the severity of your condition. Treatment may include: Observation and yearly exams. This may be the only treatment needed if your condition and symptoms are mild. Medicines to relieve your symptoms, including: Medicines to shrink the prostate. Medicines to relax the muscle of the prostate. Surgery in severe cases. Surgery may include: Prostatectomy. In this procedure, the prostate tissue is removed completely through an open incision or with a laparoscope or robotics. Transurethral resection of the prostate (TURP). In this procedure, a tool is inserted through the opening at the tip of the penis (urethra). It is used to cut away tissue of the inner core of the prostate. The pieces are removed through the same opening of the penis. This removes the blockage. Transurethral incision (TUIP). In this procedure, small cuts are made in the prostate. This lessens the prostate's pressure on the urethra. Transurethral microwave thermotherapy (TUMT). This procedure uses microwaves to create heat. The heat destroys and removes a small   amount of prostate tissue. Transurethral needle ablation (TUNA). This procedure uses radio frequencies to destroy and remove a small amount of prostate tissue. Interstitial laser coagulation (ILC).  This procedure uses a laser to destroy and remove a small amount of prostate tissue. Transurethral electrovaporization (TUVP). This procedure uses electrodes to destroy and remove a small amount of prostate tissue. Prostatic urethral lift. This procedure inserts an implant to push the lobes of the prostate away from the urethra. Follow these instructions at home: Take over-the-counter and prescription medicines only as told by your health care provider. Monitor your symptoms for any changes. Contact your health care provider with any changes. Avoid drinking large amounts of liquid before going to bed or out in public. Avoid or reduce how much caffeine or alcohol you drink. Give yourself time when you urinate. Keep all follow-up visits as told by your health care provider. This is important. Contact a health care provider if: You have unexplained back pain. Your symptoms do not get better with treatment. You develop side effects from the medicine you are taking. Your urine becomes very dark or has a bad smell. Your lower abdomen becomes distended and you have trouble passing your urine. Get help right away if: You have a fever or chills. You suddenly cannot urinate. You feel lightheaded, or very dizzy, or you faint. There are large amounts of blood or clots in the urine. Your urinary problems become hard to manage. You develop moderate to severe low back or flank pain. The flank is the side of your body between the ribs and the hip. These symptoms may represent a serious problem that is an emergency. Do not wait to see if the symptoms will go away. Get medical help right away. Call your local emergency services (911 in the [place] Do not drive yourself to the hospital. Summary Benign prostatic hyperplasia (BPH) is an enlarged prostate that is caused by the normal aging process and not by cancer. An enlarged prostate can press on the urethra. This can make it hard to pass urine. This  condition is part of a normal aging process and is more likely to develop in men over the age of 50 years. Get help right away if you suddenly cannot urinate. This information is not intended to replace advice given to you by your health care provider. Make sure you discuss any questions you have with your healthcare provider. Document Revised: 12/03/2019 Document Reviewed: 12/03/2019 Elsevier Patient Education  2022 [employer] Inc.  

## 2020-10-24 ENCOUNTER — Other Ambulatory Visit: Payer: Self-pay

## 2020-10-24 ENCOUNTER — Ambulatory Visit (HOSPITAL_COMMUNITY)
Admission: RE | Admit: 2020-10-24 | Discharge: 2020-10-24 | Disposition: A | Discharge status: Home / Self Care | Payer: Medicare Other | Source: Ambulatory Visit | Attending: Internal Medicine | Admitting: Internal Medicine

## 2020-10-24 DIAGNOSIS — R413 Other amnesia: Secondary | ICD-10-CM | POA: Diagnosis not present

## 2020-10-24 NOTE — Progress Notes (Signed)
Informed of MRI for today.   Device system confirmed to be MRI conditional, with implant date > 6 weeks ago, and no evidence of abandoned or epicardial leads in review of most recent CXR Interrogation from today performed by [employer] rep and reviewed over phone. Device changed to MRI mode with DOO 80 per device rep.   Tachy-therapies to off if applicable.  Program device back to pre-MRI settings after completion of exam.  Shirley Friar, PA-C  10/24/2020 1:05 PM

## 2020-10-24 NOTE — Progress Notes (Signed)
Per [place], PA and [employer] rep, [place]. Patient's device was set to MRI safe mode on auto detect, DOO 80.  The rep changed device settings. I will monitor patient during MRI.  Nothing further needed at this time.

## 2020-10-28 DIAGNOSIS — E669 Obesity, unspecified: Secondary | ICD-10-CM | POA: Diagnosis not present

## 2020-10-28 DIAGNOSIS — E785 Hyperlipidemia, unspecified: Secondary | ICD-10-CM | POA: Diagnosis not present

## 2020-10-28 DIAGNOSIS — D649 Anemia, unspecified: Secondary | ICD-10-CM | POA: Diagnosis not present

## 2020-10-29 LAB — CMP14+EGFR
ALT: 11 IU/L (ref 0–44)
AST: 17 IU/L (ref 0–40)
Albumin/Globulin Ratio: 1.5 (ref 1.2–2.2)
Albumin: 4.1 g/dL (ref 3.6–4.6)
Alkaline Phosphatase: 65 IU/L (ref 44–121)
BUN/Creatinine Ratio: 17 (ref 10–24)
BUN: 12 mg/dL (ref 8–27)
Bilirubin Total: 0.5 mg/dL (ref 0.0–1.2)
CO2: 22 mmol/L (ref 20–29)
Calcium: 9.1 mg/dL (ref 8.6–10.2)
Chloride: 107 mmol/L — ABNORMAL HIGH (ref 96–106)
Creatinine, Ser: 0.7 mg/dL — ABNORMAL LOW (ref 0.76–1.27)
Globulin, Total: 2.8 g/dL (ref 1.5–4.5)
Glucose: 94 mg/dL (ref 65–99)
Potassium: 4.1 mmol/L (ref 3.5–5.2)
Sodium: 143 mmol/L (ref 134–144)
Total Protein: 6.9 g/dL (ref 6.0–8.5)
eGFR: 91 mL/min/{1.73_m2} (ref >59)

## 2020-10-29 LAB — CBC WITH DIFFERENTIAL/PLATELET
Basophils Absolute: 0.1 10*3/uL (ref 0.0–0.2)
Basos: 1 %
EOS (ABSOLUTE): 0.2 10*3/uL (ref 0.0–0.4)
Eos: 4 %
Hematocrit: 37.2 % — ABNORMAL LOW (ref 37.5–51.0)
Hemoglobin: 12.3 g/dL — ABNORMAL LOW (ref 13.0–17.7)
Immature Grans (Abs): 0 10*3/uL (ref 0.0–0.1)
Immature Granulocytes: 0 %
Lymphocytes Absolute: 1.4 10*3/uL (ref 0.7–3.1)
Lymphs: 27 %
MCH: 30.3 pg (ref 26.6–33.0)
MCHC: 33.1 g/dL (ref 31.5–35.7)
MCV: 92 fL (ref 79–97)
Monocytes Absolute: 0.4 10*3/uL (ref 0.1–0.9)
Monocytes: 8 %
Neutrophils Absolute: 3 10*3/uL (ref 1.4–7.0)
Neutrophils: 60 %
Platelets: 267 10*3/uL (ref 150–450)
RBC: 4.06 x10E6/uL — ABNORMAL LOW (ref 4.14–5.80)
RDW: 11.3 % — ABNORMAL LOW (ref 11.6–15.4)
WBC: 5.1 10*3/uL (ref 3.4–10.8)

## 2020-10-29 LAB — LIPID PANEL
Chol/HDL Ratio: 2.7 ratio (ref 0.0–5.0)
Cholesterol, Total: 145 mg/dL (ref 100–199)
HDL: 53 mg/dL (ref >39)
LDL Chol Calc (NIH): 80 mg/dL (ref 0–99)
Triglycerides: 55 mg/dL (ref 0–149)
VLDL Cholesterol Cal: 12 mg/dL (ref 5–40)

## 2020-11-02 ENCOUNTER — Encounter: Payer: Self-pay | Admitting: Gastroenterology

## 2020-11-02 ENCOUNTER — Ambulatory Visit (INDEPENDENT_AMBULATORY_CARE_PROVIDER_SITE_OTHER): Payer: Medicare Other | Admitting: Gastroenterology

## 2020-11-02 ENCOUNTER — Other Ambulatory Visit: Payer: Self-pay

## 2020-11-02 DIAGNOSIS — R1013 Epigastric pain: Secondary | ICD-10-CM | POA: Diagnosis not present

## 2020-11-02 NOTE — Patient Instructions (Addendum)
Continue omeprazole daily for your stomach. Call if you have any questions or concerns.  We will see you back in one year or sooner if needed.  Heart-Healthy Eating Plan Many factors influence your heart (coronary) health, including eating and exercise habits. Coronary risk increases with abnormal blood fat (lipid) levels. Heart-healthy meal planning includes limiting unhealthy fats,increasing healthy fats, and making other diet and lifestyle changes.   What are tips for following this plan? Cooking Cook foods using methods other than frying. Baking, boiling, grilling, and broiling are all good options. Other ways to reduce fat include: Removing the skin from poultry. Removing all visible fats from meats. Steaming vegetables in water or broth. Meal planning  At meals, imagine dividing your plate into fourths: Fill one-half of your plate with vegetables and green salads. Fill one-fourth of your plate with whole grains. Fill one-fourth of your plate with lean protein foods. Eat 4-5 servings of vegetables per day. One serving equals 1 cup raw or cooked vegetable, or 2 cups raw leafy greens. Eat 4-5 servings of fruit per day. One serving equals 1 medium whole fruit,  cup dried fruit,  cup fresh, frozen, or canned fruit, or  cup 100% fruit juice. Eat more foods that contain soluble fiber. Examples include apples, broccoli, carrots, beans, peas, and barley. Aim to get 25-30 g of fiber per day. Increase your consumption of legumes, nuts, and seeds to 4-5 servings per week. One serving of dried beans or legumes equals  cup cooked, 1 serving of nuts is  cup, and 1 serving of seeds equals 1 tablespoon.  Fats Choose healthy fats more often. Choose monounsaturated and polyunsaturated fats, such as olive and canola oils, flaxseeds, walnuts, almonds, and seeds. Eat more omega-3 fats. Choose salmon, mackerel, sardines, tuna, flaxseed oil, and ground flaxseeds. Aim to eat fish at least 2 times each  week. Check food labels carefully to identify foods with trans fats or high amounts of saturated fat. Limit saturated fats. These are found in animal products, such as meats, butter, and cream. Plant sources of saturated fats include palm oil, palm kernel oil, and coconut oil. Avoid foods with partially hydrogenated oils in them. These contain trans fats. Examples are stick margarine, some tub margarines, cookies, crackers, and other baked goods. Avoid fried foods. General information Eat more home-cooked food and less restaurant, buffet, and fast food. Limit or avoid alcohol. Limit foods that are high in starch and sugar. Lose weight if you are overweight. Losing just 5-10% of your body weight can help your overall health and prevent diseases such as diabetes and heart disease. Monitor your salt (sodium) intake, especially if you have high blood pressure. Talk with your health care provider about your sodium intake. Try to incorporate more vegetarian meals weekly. What foods can I eat? Fruits All fresh, canned (in natural juice), or frozen fruits. Vegetables Fresh or frozen vegetables (raw, steamed, roasted, or grilled). Green salads. Grains Most grains. Choose whole wheat and whole grains most of the time. Rice andpasta, including brown rice and pastas made with whole wheat. Meats and other proteins Lean, well-trimmed beef, veal, pork, and lamb. Chicken and [place] without skin. All fish and shellfish. Wild duck, rabbit, pheasant, and venison. Egg whites or low-cholesterol egg substitutes. Dried beans, peas, lentils, and tofu. Seedsand most nuts. Dairy Low-fat or nonfat cheeses, including ricotta and mozzarella. Skim or 1% milk (liquid, powdered, or evaporated). Buttermilk made with low-fat milk. Nonfat orlow-fat yogurt. Fats and oils Non-hydrogenated (trans-free) margarines. Vegetable oils,  including soybean, sesame, sunflower, olive, peanut, safflower, corn, canola, and cottonseed. Salad  dressings or mayonnaisemade with a vegetable oil. Beverages Water (mineral or sparkling). Coffee and tea. Diet carbonated beverages. Sweets and desserts Sherbet, gelatin, and fruit ice. Small amounts of dark chocolate. Limit all sweets and desserts. Seasonings and condiments All seasonings and condiments. The items listed above may not be a complete list of foods and beverages you can eat. Contact a dietitian for more options. What foods are not recommended? Fruits Canned fruit in heavy syrup. Fruit in cream or butter sauce. Fried fruit. Limitcoconut. Vegetables Vegetables cooked in cheese, cream, or butter sauce. Fried vegetables. Grains Breads made with saturated or trans fats, oils, or whole milk. Croissants. Sweet rolls. Donuts. High-fat crackers,such as cheese crackers. Meats and other proteins Fatty meats, such as hot dogs, ribs, sausage, bacon, rib-eye roast or steak. High-fat deli meats, such as salami and bologna. Caviar. Domestic duck andgoose. Organ meats, such as liver. Dairy Cream, sour cream, cream cheese, and creamed cottage cheese. Whole milk cheeses. Whole or 2% milk (liquid, evaporated, or condensed). Whole buttermilk.Cream sauce or high-fat cheese sauce. Whole-milk yogurt. Fats and oils Meat fat, or shortening. Cocoa butter, hydrogenated oils, palm oil, coconut oil, palm kernel oil. Solid fats and shortenings, including bacon fat, salt pork, lard, and butter. Nondairy cream substitutes. Salad dressings with cheeseor sour cream. Beverages Regular sodas and any drinks with added sugar. Sweets and desserts Frosting. Pudding. Cookies. Cakes. Pies. Milk chocolate or white chocolate.Buttered syrups. Full-fat ice cream or ice cream drinks. The items listed above may not be a complete list of foods and beverages to avoid. Contact a dietitian for more information. Summary Heart-healthy meal planning includes limiting unhealthy fats, increasing healthy fats, and making other diet  and lifestyle changes. Lose weight if you are overweight. Losing just 5-10% of your body weight can help your overall health and prevent diseases such as diabetes and heart disease. Focus on eating a balance of foods, including fruits and vegetables, low-fat or nonfat dairy, lean protein, nuts and legumes, whole grains, and heart-healthy oils and fats. This information is not intended to replace advice given to you by your health care provider. Make sure you discuss any questions you have with your healthcare provider. Document Revised: 05/03/2017 Document Reviewed: 05/03/2017 Elsevier Patient Education  2022 [employer].

## 2020-11-02 NOTE — Progress Notes (Signed)
Primary Care Physician: Fayrene Helper, MD  Primary Gastroenterologist:  Garfield Cornea, MD   Chief Complaint  Patient presents with   Follow-up    Epigastric pain    HPI: Terry Love is a 85 y.o. male here for follow up.  Last seen in March 2022.  Back in May saw PCP for epigastric pain/substernal pain with eating, poor appetite.  Symptoms had been occurring for 5 days.  H. pylori breath test ordered but not completed. Ultrasound June 2022: Fatty liver. Gallbladder unremarkable.  Patient has a history of colon adenoma (2015), anemia previously followed.  At last office visit, Dr. Gala Romney advised against future colonoscopies unless patient develops new symptoms.  Patient has a good appetite.  States his abdominal pain resolved.  He was placed on omeprazole which seemed to have helped.  No heartburn, vomiting, abdominal pain.  Bowel movements are regular.  Overall feels good.  He states he which she could lose weight, goal of weight less than 200 pounds. November 2021 he weighed 202 pounds.  In January 2022 he was up to 219 pounds.  Today weighs 221 pounds.  He is trying to cut back on his sweets.  Continues to consume some sweet tea at times.  Tried to bake foods.  Consuming a lot of vegetables.    Current Outpatient Medications  Medication Sig Dispense Refill   albuterol (VENTOLIN HFA) 108 (90 Base) MCG/ACT inhaler INHALE 2 PUFFS INTO THE LUNGS EVERY 4 (FOUR) HOURS AS NEEDED FOR WHEEZING OR SHORTNESS OF BREATH 18 g 11   aspirin 81 MG EC tablet Take 81 mg by mouth at bedtime.     atorvastatin (LIPITOR) 40 MG tablet Take 1 tablet (40 mg total) by mouth at bedtime. 90 tablet 3   azelastine (ASTELIN) 0.1 % nasal spray Place 2 sprays into both nostrils 2 (two) times daily. Use in each nostril as directed 30 mL 12   budesonide-formoterol (SYMBICORT) 160-4.5 MCG/ACT inhaler Inhale 2 puffs into the lungs in the morning and at bedtime. 1 each 6   calcium-vitamin D (OSCAL WITH D)  500-200 MG-UNIT tablet Take 1 tablet by mouth 2 (two) times daily. 150 tablet 1   dorzolamide (TRUSOPT) 2 % ophthalmic solution 1 drop 2 (two) times daily.     furosemide (LASIX) 40 MG tablet Take 1 tablet (40 mg total) by mouth daily. 90 tablet 3   latanoprost (XALATAN) 0.005 % ophthalmic solution Place 1 drop into both eyes at bedtime.     Multiple Vitamin (MULTIVITAMIN WITH MINERALS) TABS tablet Take 1 tablet by mouth at bedtime.     omeprazole (PRILOSEC) 40 MG capsule TAKE 1 CAPSULE (40 MG TOTAL) BY MOUTH DAILY. 30 capsule 1   sertraline (ZOLOFT) 25 MG tablet Take 25 mg by mouth daily.     tamsulosin (FLOMAX) 0.4 MG CAPS capsule Take 1 capsule (0.4 mg total) by mouth daily. 90 capsule 3   venlafaxine XR (EFFEXOR-XR) 75 MG 24 hr capsule TAKE 1 CAPSULE BY MOUTH DAILY WITH BREAKFAST 90 capsule 2   vitamin C (ASCORBIC ACID) 500 MG tablet Take 500 mg by mouth at bedtime.      Vitamin D, Ergocalciferol, (DRISDOL) 1.25 MG (50000 UNIT) CAPS capsule TAKE 1 CAPSULE (50,000 UNITS TOTAL) BY MOUTH EVERY MONDAY. IN THE MORNING 12 capsule 1   Wheat Dextrin (BENEFIBER DRINK MIX PO) Take 1 Dose by mouth daily as needed (for regularity). Use as directed daily     No current facility-administered medications  for this visit.    Allergies as of 11/02/2020   (No Known Allergies)    ROS:  General: Negative for anorexia, weight loss, fever, chills, fatigue, weakness. ENT: Negative for hoarseness, difficulty swallowing , nasal congestion. CV: Negative for chest pain, angina, palpitations, dyspnea on exertion, peripheral edema.  Respiratory: Negative for dyspnea at rest, dyspnea on exertion, cough, sputum, wheezing.  GI: See history of present illness. GU:  Negative for dysuria, hematuria, urinary incontinence, urinary frequency, nocturnal urination.  Endo: Negative for unusual weight change.    Physical Examination:   BP 136/78   Pulse 65   Temp (!) 96.6 F (35.9 C) (Temporal)   Ht '5\' 10"'$  (1.778 m)    Wt 221 lb 6.4 oz (100.4 kg)   BMI 31.77 kg/m   General: Well-nourished, well-developed in no acute distress.  Eyes: No icterus. Mouth: Oropharyngeal mucosa moist and pink , no lesions erythema or exudate. Lungs: Clear to auscultation bilaterally.  Heart: Regular rate and rhythm, no murmurs rubs or gallops.  Abdomen: Bowel sounds are normal, nontender, nondistended, no hepatosplenomegaly or masses, no abdominal bruits or hernia , no rebound or guarding.   Extremities: No lower extremity edema. No clubbing or deformities. Neuro: Alert and oriented x 4   Skin: Warm and dry, no jaundice.   Psych: Alert and cooperative, normal mood and affect.  Labs:  Lab Results  Component Value Date   CREATININE 0.70 (L) 10/28/2020   BUN 12 10/28/2020   NA 143 10/28/2020   K 4.1 10/28/2020   CL 107 (H) 10/28/2020   CO2 22 10/28/2020   Lab Results  Component Value Date   WBC 5.1 10/28/2020   HGB 12.3 (L) 10/28/2020   HCT 37.2 (L) 10/28/2020   MCV 92 10/28/2020   PLT 267 10/28/2020   Lab Results  Component Value Date   ALT 11 10/28/2020   AST 17 10/28/2020   ALKPHOS 65 10/28/2020   BILITOT 0.5 10/28/2020   Lab Results  Component Value Date   IRON 99 11/06/2018   FERRITIN 175 05/12/2019   Lab Results  Component Value Date   VITAMINB12 1,083 01/30/2018   Lab Results  Component Value Date   FOLATE 22.3 01/30/2018     Imaging Studies: MR BRAIN WO CONTRAST  Result Date: 10/24/2020 CLINICAL DATA:  Memory loss EXAM: MRI HEAD WITHOUT CONTRAST TECHNIQUE: Multiplanar, multiecho pulse sequences of the brain and surrounding structures were obtained without intravenous contrast. COMPARISON:  07/05/2004 FINDINGS: Brain: No acute infarct, mass effect or extra-axial collection. No acute or chronic hemorrhage. There is multifocal hyperintense T2-weighted signal within the white matter. Generalized volume loss without a clear lobar predilection. The midline structures are normal. Vascular: Major  flow voids are preserved. Skull and upper cervical spine: Normal calvarium and skull base. Visualized upper cervical spine and soft tissues are normal. Sinuses/Orbits:No paranasal sinus fluid levels or advanced mucosal thickening. No mastoid or middle ear effusion. Normal orbits. IMPRESSION: 1. No acute intracranial abnormality. 2. Findings of chronic microvascular ischemia and generalized volume loss without a clear lobar predilection. Electronically Signed   By: Ulyses Jarred M.D.   On: 10/24/2020 18:43   CUP PACEART REMOTE DEVICE CHECK  Result Date: 10/05/2020 Scheduled remote reviewed. Normal device function.  Next remote 91 days.  US Abdomen Limited RUQ (LIVER/GB)  Result Date: 10/07/2020 CLINICAL DATA:  History of NASH, follow-up EXAM: ULTRASOUND ABDOMEN LIMITED RIGHT UPPER QUADRANT COMPARISON:  Report from ultrasound October 21, 2001 however no concurrent imaging available at time  dictation. FINDINGS: Gallbladder: No gallstones or wall thickening visualized. No sonographic Murphy sign noted by sonographer. Common bile duct: Diameter: 3 mm Liver: No focal lesion identified. Diffusely increased parenchymal echogenicity. Portal vein is patent on color Doppler imaging with normal direction of blood flow towards the liver. Other: None. IMPRESSION: Diffusely increased parenchymal echogenicity of the liver. This is a nonspecific finding but is most commonly seen with fatty infiltration of the liver. There are no obvious focal liver lesions identified. Electronically Signed   By: Dahlia Bailiff MD   On: 10/07/2020 13:44     Assessment:  85 year old male with history of anemia of chronic disease, constipation, bloating, history of colon polyps presenting for follow-up due to epigastric pain.  Symptoms occurred 2 months ago, short-lived.  Right upper quadrant ultrasound with normal-appearing gallbladder, some fatty liver.  LFTs normal.  Started on omeprazole.  States he is doing very well with no recurrent  abdominal pain.  From a GI standpoint he is very pleased.  States his medications have finally been "tweaked perfectly".  Symptoms could have been due to dyspepsia.  Anemia likely multifactorial.  Mild in nature and stable.  B12, folate, ferritin all normal last year.  No plans for future colonoscopy unless change in symptoms i.e. change in stools, blood in stools, decline in anemia.   Plan: Would recommend continuing omeprazole daily for now.  Could consider trial off in the future. Encouraged healthy diet, goal of 10 to 20 pound weight loss.  Handout provided. We will see him back in 1 year where he should call sooner if he has any questions or concerns.

## 2020-11-03 ENCOUNTER — Encounter: Payer: Self-pay | Admitting: Pulmonary Disease

## 2020-11-03 ENCOUNTER — Ambulatory Visit (INDEPENDENT_AMBULATORY_CARE_PROVIDER_SITE_OTHER): Payer: Medicare Other | Admitting: Pulmonary Disease

## 2020-11-03 VITALS — BP 124/76 | HR 68 | Ht 70.0 in | Wt 219.8 lb

## 2020-11-03 DIAGNOSIS — J4530 Mild persistent asthma, uncomplicated: Secondary | ICD-10-CM | POA: Diagnosis not present

## 2020-11-03 NOTE — Patient Instructions (Signed)
I am glad you are doing well with regard to your breathing Work on weight loss with diet and exercise We can attempt to wean you off the Symbicort to see how you do Start using Symbicort 1 puff twice daily and if you are stable then he can use it every other day for a few weeks and then stop altogether  If at any point you have increasing shortness of breath then go back up on the inhaler to 2 puffs twice daily Follow-up in clinic in 6 months

## 2020-11-03 NOTE — Progress Notes (Signed)
Terry Love    IE:[id number]    November 14, 1935  Primary Care Physician:Simpson, Norwood Levo, MD  Referring Physician: Fayrene Helper, MD [address] [place],  [place] [address]  Chief complaint:  Follow-up for mild persistent asthma  HPI: 85 year old with past medical history of insomnia, snoring, vitamin D deficiency, hyperlipidemia depression with anxiety. He was admitted in March 2018 with presumed COPD exacerbation treated with IV steroids, antibiotics and nebulizers.  He is lifelong nonsmoker but used to chew tobacco. He worked various jobs including the [employer] He does not report any exposures at work or at home. He has history of insomnia evaluated by a sleep study in February 2017. This was reportedly normal per the patient.  Interim history: Continues on Symbicort  Continues to have dyspnea on exertion which is unchanged Follows with cardiology for symptomatic bradycardia s/p PPM  Outpatient Encounter Medications as of 11/03/2020  Medication Sig   albuterol (VENTOLIN HFA) 108 (90 Base) MCG/ACT inhaler INHALE 2 PUFFS INTO THE LUNGS EVERY 4 (FOUR) HOURS AS NEEDED FOR WHEEZING OR SHORTNESS OF BREATH   aspirin 81 MG EC tablet Take 81 mg by mouth at bedtime.   atorvastatin (LIPITOR) 40 MG tablet Take 1 tablet (40 mg total) by mouth at bedtime.   azelastine (ASTELIN) 0.1 % nasal spray Place 2 sprays into both nostrils 2 (two) times daily. Use in each nostril as directed   budesonide-formoterol (SYMBICORT) 160-4.5 MCG/ACT inhaler Inhale 2 puffs into the lungs in the morning and at bedtime.   calcium-vitamin D (OSCAL WITH D) 500-200 MG-UNIT tablet Take 1 tablet by mouth 2 (two) times daily.   dorzolamide (TRUSOPT) 2 % ophthalmic solution 1 drop 2 (two) times daily.   furosemide (LASIX) 40 MG tablet Take 1 tablet (40 mg total) by mouth daily.   latanoprost (XALATAN) 0.005 % ophthalmic solution Place 1 drop into both eyes at bedtime.   Multiple  Vitamin (MULTIVITAMIN WITH MINERALS) TABS tablet Take 1 tablet by mouth at bedtime.   omeprazole (PRILOSEC) 40 MG capsule TAKE 1 CAPSULE (40 MG TOTAL) BY MOUTH DAILY.   sertraline (ZOLOFT) 25 MG tablet Take 25 mg by mouth daily.   tamsulosin (FLOMAX) 0.4 MG CAPS capsule Take 1 capsule (0.4 mg total) by mouth daily.   venlafaxine XR (EFFEXOR-XR) 75 MG 24 hr capsule TAKE 1 CAPSULE BY MOUTH DAILY WITH BREAKFAST   vitamin C (ASCORBIC ACID) 500 MG tablet Take 500 mg by mouth at bedtime.    Vitamin D, Ergocalciferol, (DRISDOL) 1.25 MG (50000 UNIT) CAPS capsule TAKE 1 CAPSULE (50,000 UNITS TOTAL) BY MOUTH EVERY MONDAY. IN THE MORNING   Wheat Dextrin (BENEFIBER DRINK MIX PO) Take 1 Dose by mouth daily as needed (for regularity). Use as directed daily   No facility-administered encounter medications on file as of 11/03/2020.   Physical Exam: Blood pressure 124/76, pulse 68, height '5\' 10"'$  (1.778 m), weight 219 lb 12.8 oz (99.7 kg), SpO2 98 %. Gen:      No acute distress HEENT:  EOMI, sclera anicteric Neck:     No masses; no thyromegaly Lungs:    Clear to auscultation bilaterally; normal respiratory effort CV:         Regular rate and rhythm; no murmurs Abd:      + bowel sounds; soft, non-tender; no palpable masses, no distension Ext:    No edema; adequate peripheral perfusion Skin:      Warm and dry; no Love  Neuro: alert and oriented x 3 Psych: normal mood and affect   Data Reviewed: Imaging: Chest x-ray 06/27/2020-no acute cardiopulmonary disease I have reviewed the images personally  PFTs 09/05/16 FVC 2.90 (84%), FEV1 2.32 (92%), F/F 80, TLC 82%, DLCO 79% Minimal reduction in diffusion capacity.  FENO 09/05/16- 54  Labs: CBC with diff 09/05/16- WBC 8.2, 5.4% eos, absolute eso count 460 Blood allergy profile 09/05/16- negative RAST, IgE 150  Cardiac: Echo 03/05/2018 [facility], EF 60 to [id number], grade 1 diastolic dysfunction  Assessment:  Mild persistent asthma PFTs which did not show any  obstruction. There is some suggestion of small airway disease as he has improvement in his mid flow rates. He does not have emphysema. Likely asthma given high FENO, eos and IgE with recurrent attacks of wheezing  Continue Symbicort Given mild symptoms and cost of inhaler we will attempt to see if we can wean him off controller medication Start using Symbicort 1 puff twice daily and if stable then he can use it every other day for a few weeks and then stop altogether  If at any point he has increasing shortness of breath then go back up on the inhaler to 2 puffs twice daily Follow-up in clinic in 6 months  Plan/Recommendations: - Wean Symbicort, albuterol as needed - Weight loss with diet and exercise  Marshell Garfinkel MD [facility] 11/03/2020, 9:22 AM  CC: Fayrene Helper, MD

## 2020-11-14 ENCOUNTER — Other Ambulatory Visit: Payer: Self-pay | Admitting: Family Medicine

## 2020-12-02 ENCOUNTER — Telehealth: Payer: Self-pay | Admitting: *Deleted

## 2020-12-02 NOTE — Chronic Care Management (AMB) (Signed)
  Chronic Care Management   Note  12/02/2020 Name: Terry Love MRN: [id number] DOB: Feb 11, 1936  Terry Love is a 85 y.o. year old male who is a primary care patient of Terry Love Norwood Levo, MD. I reached out to Terry Love by phone today in response to a referral sent by Terry Love PCP, Terry Love      Terry Love was given information about Chronic Care Management services today including:  CCM service includes personalized support from designated clinical staff supervised by his physician, including individualized plan of care and coordination with other care providers 24/7 contact phone numbers for assistance for urgent and routine care needs. Service will only be billed when office clinical staff spend 20 minutes or more in a month to coordinate care. Only one practitioner may furnish and bill the service in a calendar month. The patient may stop CCM services at any time (effective at the end of the month) by phone call to the office staff. The patient will be responsible for cost sharing (co-pay) of up to 20% of the service fee (after annual deductible is met).  Patient agreed to services and verbal consent obtained.   Follow up plan: Telephone appointment with care management team member scheduled for:12/15/20  [facility]  Direct Dial: [phone]

## 2020-12-09 ENCOUNTER — Ambulatory Visit: Payer: Medicare Other | Admitting: Cardiology

## 2020-12-09 ENCOUNTER — Other Ambulatory Visit: Payer: Self-pay

## 2020-12-09 ENCOUNTER — Ambulatory Visit (INDEPENDENT_AMBULATORY_CARE_PROVIDER_SITE_OTHER): Payer: Medicare Other | Admitting: Family Medicine

## 2020-12-09 ENCOUNTER — Telehealth: Payer: Medicare Other | Admitting: Internal Medicine

## 2020-12-09 DIAGNOSIS — U071 COVID-19: Secondary | ICD-10-CM

## 2020-12-09 DIAGNOSIS — R062 Wheezing: Secondary | ICD-10-CM

## 2020-12-09 MED ORDER — NIRMATRELVIR/RITONAVIR (PAXLOVID)TABLET: 3.0000 | ORAL_TABLET | Freq: Two times a day (BID) | ORAL | 0 refills | Status: DC

## 2020-12-09 MED ORDER — PREDNISONE 5 MG PO TABS: 5.0000 mg | ORAL_TABLET | Freq: Two times a day (BID) | ORAL | 0 refills | Status: DC

## 2020-12-09 NOTE — Patient Instructions (Addendum)
F/U phone visit next week, with MD, re evaluate covid  Paxlovid and prednisone are prescribed, take as directed  Important that  you stay well hydrated, and rest, also you may take tylenol 325 mg up to 3 times daly for pain, body aches and fever  If you worsen , you need to go to the ED  Thanks for choosing [facility], we consider it a privelige to serve you.

## 2020-12-09 NOTE — Progress Notes (Signed)
Virtual Visit via Telephone Note  I connected withIvinia Love, wife of  Terry Love  who is hard of hearing on 12/09/20 at  2:00 PM EDT by telephone and verified that I am speaking with the correct person using two identifiers.  Location: Patient: home Provider: office    I discussed the limitations, risks, security and privacy concerns of performing an evaluation and management service by telephone and the availability of in person appointments. I also discussed with the patient that there may be a patient responsible charge related to this service. The patient expressed understanding and agreed to proceed.   History of Present Illness: Positive covid test result 1 day ago, temp was 10.05 at 8:30 this morning . Yesterday, after he was out mowing he was extremely exhausted and could barely get off the mower by himself. Breathing is difficult esp with the mask, 911 was called and assesed him at his home Covid test is positive despite being fully vaccinated and boosted   Observations/Objective: There were no vitals taken for this visit.   Historian is spouse Assessment and Plan: COVID paxlovid prescribed and pt nd spouse educated re expected course of illness and advised Ed eval should he deteriorate  Wheezing Reports wheezing with current infection, short course of prednisone is prescribed   Follow Up Instructions:    I discussed the assessment and treatment plan with the patient. The patient was provided an opportunity to ask questions and all were answered. The patient agreed with the plan and demonstrated an understanding of the instructions.   The patient was advised to call back or seek an in-person evaluation if the symptoms worsen or if the condition fails to improve as anticipated.  I provided 11 minutes of non-face-to-face time during this encounter.   Tula Nakayama, MD

## 2020-12-12 ENCOUNTER — Encounter: Payer: Self-pay | Admitting: Family Medicine

## 2020-12-12 NOTE — Assessment & Plan Note (Signed)
paxlovid prescribed and pt nd spouse educated re expected course of illness and advised Ed eval should he deteriorate

## 2020-12-12 NOTE — Assessment & Plan Note (Signed)
Reports wheezing with current infection, short course of prednisone is prescribed

## 2020-12-13 ENCOUNTER — Telehealth: Payer: Self-pay | Admitting: Cardiology

## 2020-12-13 NOTE — Telephone Encounter (Signed)
  Patient Consent for Virtual Visit        Terry Love has provided verbal consent on 12/13/2020 for a virtual visit (video or telephone).   CONSENT FOR VIRTUAL VISIT FOR:  Terry Love  By participating in this virtual visit I agree to the following:  I hereby voluntarily request, consent and authorize [facility] and its employed or contracted physicians, physician assistants, nurse practitioners or other licensed health care professionals (the Practitioner), to provide me with telemedicine health care services (the "Services") as deemed necessary by the treating Practitioner. I acknowledge and consent to receive the Services by the Practitioner via telemedicine. I understand that the telemedicine visit will involve communicating with the Practitioner through live audiovisual communication technology and the disclosure of certain medical information by electronic transmission. I acknowledge that I have been given the opportunity to request an in-person assessment or other available alternative prior to the telemedicine visit and am voluntarily participating in the telemedicine visit.  I understand that I have the right to withhold or withdraw my consent to the use of telemedicine in the course of my care at any time, without affecting my right to future care or treatment, and that the Practitioner or I may terminate the telemedicine visit at any time. I understand that I have the right to inspect all information obtained and/or recorded in the course of the telemedicine visit and may receive copies of available information for a reasonable fee.  I understand that some of the potential risks of receiving the Services via telemedicine include:  Delay or interruption in medical evaluation due to technological equipment failure or disruption; Information transmitted may not be sufficient (e.g. poor resolution of images) to allow for appropriate medical decision making by the Practitioner;  and/or  In rare instances, security protocols could fail, causing a breach of personal health information.  Furthermore, I acknowledge that it is my responsibility to provide information about my medical history, conditions and care that is complete and accurate to the best of my ability. I acknowledge that Practitioner's advice, recommendations, and/or decision may be based on factors not within their control, such as incomplete or inaccurate data provided by me or distortions of diagnostic images or specimens that may result from electronic transmissions. I understand that the practice of medicine is not an exact science and that Practitioner makes no warranties or guarantees regarding treatment outcomes. I acknowledge that a copy of this consent can be made available to me via my patient portal ([facility]), or I can request a printed copy by calling the office of [facility].    I understand that my insurance will be billed for this visit.   I have read or had this consent read to me. I understand the contents of this consent, which adequately explains the benefits and risks of the Services being provided via telemedicine.  I have been provided ample opportunity to ask questions regarding this consent and the Services and have had my questions answered to my satisfaction. I give my informed consent for the services to be provided through the use of telemedicine in my medical care

## 2020-12-14 ENCOUNTER — Other Ambulatory Visit: Payer: Self-pay

## 2020-12-14 ENCOUNTER — Other Ambulatory Visit: Payer: Self-pay | Admitting: Family Medicine

## 2020-12-14 ENCOUNTER — Ambulatory Visit (INDEPENDENT_AMBULATORY_CARE_PROVIDER_SITE_OTHER): Payer: Medicare Other | Admitting: Family Medicine

## 2020-12-14 ENCOUNTER — Encounter: Payer: Self-pay | Admitting: Family Medicine

## 2020-12-14 DIAGNOSIS — U071 COVID-19: Secondary | ICD-10-CM | POA: Diagnosis not present

## 2020-12-14 NOTE — Progress Notes (Signed)
Virtual Visit via Telephone Note  I connected with Terry Love and Terry Love on 12/14/20 at  9:20 AM EDT by telephone and verified that I am speaking with the correct person using two identifiers.  Location: Patient: home Provider: office   I discussed the limitations, risks, security and privacy concerns of performing an evaluation and management service by telephone and the availability of in person appointments. I also discussed with the patient that there may be a patient responsible charge related to this service. The patient expressed understanding and agreed to proceed.   History of Present Illness: Has completed paxlovid, no adverse s/e feels back to himself, denies fever, chills, cough, head or chest congestion, has good energy level and good appetite Denies loss of taste or smell   Observations/Objective: BP 110/71   Pulse 67  Good communication with no confusion and intact memory. Alert and oriented x 3 No signs of respiratory distress during speech    Assessment and Plan: COVID Asymptomatic following 5 day course of paxlovid, feels back to himself, advised he may go out in the next 2 days  Advised to hold on booster for 3 months  Follow Up Instructions:    I discussed the assessment and treatment plan with the patient. The patient was provided an opportunity to ask questions and all were answered. The patient agreed with the plan and demonstrated an understanding of the instructions.   The patient was advised to call back or seek an in-person evaluation if the symptoms worsen or if the condition fails to improve as anticipated.  I provided 7 minutes of non-face-to-face time during this encounter.   Tula Nakayama, MD

## 2020-12-14 NOTE — Patient Instructions (Signed)
F/u as before, call if you need me sooner   Thankful that you have fully recovered , with no complications from your recent Covid infection  You may go out in the public, no need for quarantine  starting this Friday  Thanks for choosing [facility], we consider it a privelige to serve you.

## 2020-12-14 NOTE — Assessment & Plan Note (Addendum)
Asymptomatic following 5 day course of paxlovid, feels back to himself, advised he may go out in the next 2 days  Advised to hold on booster for 3 months

## 2020-12-15 ENCOUNTER — Ambulatory Visit: Payer: Medicare Other | Admitting: *Deleted

## 2020-12-15 ENCOUNTER — Other Ambulatory Visit: Payer: Self-pay

## 2020-12-15 ENCOUNTER — Ambulatory Visit (INDEPENDENT_AMBULATORY_CARE_PROVIDER_SITE_OTHER): Payer: Medicare Other | Admitting: Cardiology

## 2020-12-15 ENCOUNTER — Encounter: Payer: Self-pay | Admitting: Cardiology

## 2020-12-15 VITALS — BP 99/67 | HR 77 | Ht 70.0 in | Wt 210.0 lb

## 2020-12-15 DIAGNOSIS — E782 Mixed hyperlipidemia: Secondary | ICD-10-CM | POA: Diagnosis not present

## 2020-12-15 DIAGNOSIS — R0600 Dyspnea, unspecified: Secondary | ICD-10-CM | POA: Diagnosis not present

## 2020-12-15 DIAGNOSIS — R001 Bradycardia, unspecified: Secondary | ICD-10-CM | POA: Diagnosis not present

## 2020-12-15 DIAGNOSIS — I495 Sick sinus syndrome: Secondary | ICD-10-CM | POA: Diagnosis not present

## 2020-12-15 DIAGNOSIS — R0609 Other forms of dyspnea: Secondary | ICD-10-CM

## 2020-12-15 MED ORDER — FUROSEMIDE 40 MG PO TABS: ORAL_TABLET | ORAL | 3 refills | Status: DC

## 2020-12-15 NOTE — Addendum Note (Signed)
Addended by: Berlinda Last on: 12/15/2020 01:36 PM   Modules accepted: Orders

## 2020-12-15 NOTE — Patient Instructions (Signed)
Medication Instructions:  Your physician has recommended you make the following change in your medication:  LASIX- Alternate taking 40 mg (1 tablet) with 20 mg (0.5 tablet) every other day   *If you need a refill on your cardiac medications before your next appointment, please call your pharmacy*   Lab Work: None If you have labs (blood work) drawn today and your tests are completely normal, you will receive your results only by: [place] (if you have MyChart) OR A paper copy in the mail If you have any lab test that is abnormal or we need to change your treatment, we will call you to review the results.   Testing/Procedures: None   Follow-Up: At [facility], you and your health needs are our priority.  As part of our continuing mission to provide you with exceptional heart care, we have created designated Provider Care Teams.  These Care Teams include your primary Cardiologist (physician) and Advanced Practice Providers (APPs -  Physician Assistants and Nurse Practitioners) who all work together to provide you with the care you need, when you need it.  We recommend signing up for the patient portal called "MyChart".  Sign up information is provided on this After Visit Summary.  MyChart is used to connect with patients for Virtual Visits (Telemedicine).  Patients are able to view lab/test results, encounter notes, upcoming appointments, etc.  Non-urgent messages can be sent to your provider as well.   To learn more about what you can do with MyChart, go to [email or web address].    Your next appointment:   6 month(s)  The format for your next appointment:   In Person  Provider:   Carlyle Dolly, MD   Other Instructions

## 2020-12-15 NOTE — Chronic Care Management (AMB) (Signed)
   12/15/2020  Terry Love [birth year] [id number]   Telephone call to patient/ spouse for initial telephone outreach, spoke with wife [name] due to patient cannot hear on the phone, wife reports "we talked about it and he has decided he doesn't want this program due to pulled in too many directions"  Patient refused program, case closed.  RN care manager reminded spouse if anything changes in the future, pt can be referred back to the program.  Jacqlyn Larsen [facility], BSN RN Case Manager [facility] [phone]

## 2020-12-15 NOTE — Progress Notes (Signed)
Virtual Visit via Telephone Note   This visit type was conducted due to national recommendations for restrictions regarding the COVID-19 Pandemic (e.g. social distancing) in an effort to limit this patient's exposure and mitigate transmission in our community.  Due to his co-morbid illnesses, this patient is at least at moderate risk for complications without adequate follow up.  This format is felt to be most appropriate for this patient at this time.  The patient did not have access to video technology/had technical difficulties with video requiring transitioning to audio format only (telephone).  All issues noted in this document were discussed and addressed.  No physical exam could be performed with this format.  Please refer to the patient's chart for his  consent to telehealth for [facility].    Date:  12/15/2020   ID:  Terry Love, DOB 01-26-1936, MRN [id number] The patient was identified using 2 identifiers.  Patient Location: Home Provider Location: Office/Clinic   PCP:  Arnoldo Lenis, MD   [facility] Providers Cardiologist:  Dr Carlyle Dolly    Evaluation Performed:  Follow-Up Visit  Chief Complaint:  Follow up   History of Present Illness:    Terry Love is a 85 y.o. male  seen today for follow up of the following medical problems.      1. Symptomatic bradycardia - pacemaker followed by Dr Lovena Le - 03/2020 device check with normal function - no recent symptoms.    09/2020 device check normal function - some recent dizziness with standing.    2. Hyperlipidemia - 11/2019 TC 145 HDL 51 TG 65 LDL 80 - compliant with statin  10/2020 TC 145 TG 55 HDL 52 LDL 80   3. SOB/DOE - chronic DOE, overall unchanged - extensive cardiac and pulmonary workup over the years as reported below overall benign   - chronic stable symptoms    4. Recent COVID infection - managed by pcp, completed 5 day course of paxlovid    The patient does have symptoms  concerning for COVID-19 infection (fever, chills, cough, or new shortness of breath).    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 Rehman:Examination performed to cecum Pan colonic diverticulosis/2 small polyps ablated via cold biopsy from transverse colon/External hemorrhoids, benign polyps   COLONOSCOPY N/A 06/29/2013   Procedure: COLONOSCOPY;  Surgeon: Daneil Dolin, MD;  [facility];  Service: Endoscopy;  Laterality: N/A;  10:45   EYE SURGERY     left eye cataracts    PACEMAKER IMPLANT N/A 03/24/2018   Procedure: PACEMAKER IMPLANT;  Surgeon: Evans Lance, MD;  Location: [place] [facility];  Service: Cardiovascular;  Laterality: N/A;     Current Meds  Medication Sig   albuterol (VENTOLIN HFA) 108 (90 Base) MCG/ACT inhaler INHALE 2 PUFFS INTO THE LUNGS EVERY 4 (FOUR) HOURS AS NEEDED FOR WHEEZING OR SHORTNESS OF BREATH   aspirin 81 MG  EC tablet Take 81 mg by mouth at bedtime.   atorvastatin (LIPITOR) 40 MG tablet Take 1 tablet (40 mg total) by mouth at bedtime.   azelastine (ASTELIN) 0.1 % nasal spray Place 2 sprays into both nostrils 2 (two) times daily. Use in each nostril as directed   budesonide-formoterol (SYMBICORT) 160-4.5 MCG/ACT inhaler Inhale 2 puffs into the lungs in the morning and at bedtime.   calcium-vitamin D (OSCAL WITH D) 500-200 MG-UNIT tablet Take 1 tablet by mouth 2 (two) times daily.   dorzolamide (TRUSOPT) 2 % ophthalmic solution 1 drop 2 (two) times daily.   furosemide (LASIX) 40 MG tablet Take 1 tablet (40 mg total) by mouth daily.    latanoprost (XALATAN) 0.005 % ophthalmic solution Place 1 drop into both eyes at bedtime.   Multiple Vitamin (MULTIVITAMIN WITH MINERALS) TABS tablet Take 1 tablet by mouth at bedtime.   omeprazole (PRILOSEC) 40 MG capsule TAKE 1 CAPSULE (40 MG TOTAL) BY MOUTH DAILY.   sertraline (ZOLOFT) 25 MG tablet Take 25 mg by mouth daily.   tamsulosin (FLOMAX) 0.4 MG CAPS capsule Take 1 capsule (0.4 mg total) by mouth daily.   venlafaxine XR (EFFEXOR-XR) 150 MG 24 hr capsule Take 1 capsule by mouth every morning.   vitamin C (ASCORBIC ACID) 500 MG tablet Take 500 mg by mouth at bedtime.    [START ON 12/19/2020] Vitamin D, Ergocalciferol, (DRISDOL) 1.25 MG (50000 UNIT) CAPS capsule TAKE 1 CAPSULE (50,000 UNITS TOTAL) BY MOUTH EVERY MONDAY. IN THE MORNING   Wheat Dextrin (BENEFIBER DRINK MIX PO) Take 1 Dose by mouth daily as needed (for regularity). Use as directed daily     Allergies:   Patient has no known allergies.   Social History   Tobacco Use   Smoking status: Never   Smokeless tobacco: Former    Types: Chew    Quit date: 03/13/2016  Vaping Use   Vaping Use: Never used  Substance Use Topics   Alcohol use: No    Alcohol/week: 0.0 standard drinks   Drug use: No     Family Hx: The patient's family history includes Aneurysm in his brother and father; Colon cancer in his mother; Stroke in his brother.  ROS:   Please see the history of present illness.    All other systems reviewed and are negative.   Prior CV studies:   The following studies were reviewed today:  02/2018 echo Study Conclusions   - Left ventricle: The cavity size was normal. Wall thickness was    increased increased in a pattern of mild to moderate LVH.    Systolic function was normal. The estimated ejection fraction was    in the range of 60% to 65%. Wall motion was normal; there were no    regional wall motion abnormalities. Doppler parameters are    consistent with abnormal left ventricular relaxation (grade 1     diastolic dysfunction).  - Aortic valve: Moderately calcified annulus. Trileaflet; mildly    calcified leaflets. There was mild regurgitation.  - Mitral valve: Mildly calcified annulus. There was trivial    regurgitation.  - Right atrium: Central venous pressure (est): 3 mm Hg.  - Atrial septum: No defect or patent foramen ovale was identified.  - Tricuspid valve: There was trivial regurgitation.  - Pulmonary arteries: Systolic pressure could not be accurately    estimated.  - Pericardium, extracardiac: There was no pericardial effusion.      02/2018 GXT Nondiagnostic exercise treadmill test for ischemia due to  blunted heart rate response, however consistent with chronotropic incompetence with patient achieving only 57% of MPHR. Test discontinued due to patient fatigue and shortness of breath. Blood pressure demonstrated a normal response to exercise.       03/2016 nuclear stress There was no ST segment deviation noted during stress. The study is normal. No ischemia or scar. This is a low risk study. Nuclear stress EF: 70%.     08/2016 PFTs Minimal diffusion defect, no obstruction     Labs/Other Tests and Data Reviewed:    EKG:  n/a  Recent Labs: 05/11/2020: TSH 1.860 06/27/2020: NT-Pro BNP 47 10/28/2020: ALT 11; BUN 12; Creatinine, Ser 0.70; Hemoglobin 12.3; Platelets 267; Potassium 4.1; Sodium 143   Recent Lipid Panel Lab Results  Component Value Date/Time   CHOL 145 10/28/2020 08:04 AM   TRIG 55 10/28/2020 08:04 AM   HDL 53 10/28/2020 08:04 AM   CHOLHDL 2.7 10/28/2020 08:04 AM   CHOLHDL 2.8 12/01/2019 09:21 AM   LDLCALC 80 10/28/2020 08:04 AM   LDLCALC 80 12/01/2019 09:21 AM    Wt Readings from Last 3 Encounters:  12/15/20 210 lb (95.3 kg)  11/03/20 219 lb 12.8 oz (99.7 kg)  11/02/20 221 lb 6.4 oz (100.4 kg)         Objective:    Vital Signs:  BP 99/67   Pulse 77   Ht '5\' 10"'$  (1.778 m)   Wt 210 lb (95.3 kg)   BMI 30.13 kg/m    Normal affect. Normal  speech pattern and tone. Comfortable, no apparent distress. No audible signs of sob or wheezing  ASSESSMENT & PLAN:    1. Pacemaker/symptomatic bradycardia/sinus bradycardia - nromal device check - some recent dizziness primarily orthostatic in description. Lasix was increased to '40mg'$  daily at 09/2020 EP visit, will lower to '40mg'$  alternating days with '20mg'$   2. Hyperlipidemia -lipids at goal, continue current meds   3. DOE - chronic symptoms overall unchanged, extensive workup over the last few years fairly benign - continue to monitor at this time.         COVID-19 Education: The signs and symptoms of COVID-19 were discussed with the patient and how to seek care for testing (follow up with PCP or arrange E-visit).  The importance of social distancing was discussed today.  Time:   Today, I have spent 23 minutes with the patient with telehealth technology discussing the above problems.     Medication Adjustments/Labs and Tests Ordered: Current medicines are reviewed at length with the patient today.  Concerns regarding medicines are outlined above.   Tests Ordered: No orders of the defined types were placed in this encounter.   Medication Changes: No orders of the defined types were placed in this encounter.   Follow Up:  In Person 6 months  Signed, Carlyle Dolly, MD  12/15/2020 12:34 PM    [place]

## 2020-12-16 ENCOUNTER — Other Ambulatory Visit: Payer: Self-pay | Admitting: Family Medicine

## 2020-12-20 ENCOUNTER — Other Ambulatory Visit: Payer: Self-pay | Admitting: Pulmonary Disease

## 2020-12-20 ENCOUNTER — Telehealth: Payer: Self-pay

## 2020-12-20 ENCOUNTER — Other Ambulatory Visit: Payer: Self-pay

## 2020-12-20 DIAGNOSIS — J4530 Mild persistent asthma, uncomplicated: Secondary | ICD-10-CM

## 2020-12-20 NOTE — Telephone Encounter (Signed)
Error

## 2020-12-20 NOTE — Telephone Encounter (Signed)
Patient calling he has tested positive again he just had covid on 12/14/20 and they are wondering what he should do since he just took the antiviral  ph# [phone]

## 2020-12-21 NOTE — Telephone Encounter (Signed)
I spoke with Terry Love, he is asymptomatixc except for a runny  nose, no additional testing advised and explained it sometimes takes time for  result to become negative. Wear mask, no isolation needed, call back if deteriorates, Advised against repeat testing while asymptomatic, not recommended, she understands

## 2021-01-08 ENCOUNTER — Other Ambulatory Visit: Payer: Self-pay | Admitting: Family Medicine

## 2021-01-19 DIAGNOSIS — Z23 Encounter for immunization: Secondary | ICD-10-CM | POA: Diagnosis not present

## 2021-01-20 ENCOUNTER — Telehealth: Payer: Self-pay

## 2021-01-20 NOTE — Telephone Encounter (Signed)
The patient wife called to get help with his monitor. [employer] is sending the patient a new monitor in 3-5 business days.

## 2021-02-04 ENCOUNTER — Other Ambulatory Visit: Payer: Self-pay | Admitting: Family Medicine

## 2021-02-07 ENCOUNTER — Other Ambulatory Visit: Payer: Self-pay

## 2021-02-07 ENCOUNTER — Ambulatory Visit (INDEPENDENT_AMBULATORY_CARE_PROVIDER_SITE_OTHER): Payer: Medicare Other

## 2021-02-07 DIAGNOSIS — Z0000 Encounter for general adult medical examination without abnormal findings: Secondary | ICD-10-CM | POA: Diagnosis not present

## 2021-02-07 NOTE — Progress Notes (Signed)
Subjective:   Terry Love is a 85 y.o. male who presents for Medicare Annual/Subsequent preventive examination.  I connected with  REES SANTISTEVAN on 02/07/21 by a audio enabled telemedicine application and verified that I am speaking with the correct person using two identifiers.   I discussed the limitations, risks, security and privacy concerns of performing an evaluation and management service by telephone and the availability of in person appointments. I also discussed with the patient that there may be a patient responsible charge related to this service. The patient expressed understanding and verbally consented to this telephonic visit.   Review of Systems           Objective:    There were no vitals filed for this visit. There is no height or weight on file to calculate BMI.  Advanced Directives 02/02/2020 03/24/2018 03/04/2018 03/04/2018 01/27/2018 08/27/2016 06/24/2016  Does Patient Have a Medical Advance Directive? No;Yes No No No Yes Yes No  Type of Advance Directive Living will - - - Living will;Healthcare Power of [place];Living will -  Does patient want to make changes to medical advance directive? Yes (MAU/Ambulatory/Procedural Areas - Information given) - - - No - Patient declined - -  Copy of [place] in Chart? - - - - Yes No - copy requested -  Would patient like information on creating a medical advance directive? No - Patient declined No - Patient declined No - Patient declined - - - No - Patient declined  Pre-existing out of facility DNR order (yellow form or pink MOST form) - - - - - - -    Current Medications (verified) Outpatient Encounter Medications as of 02/07/2021  Medication Sig   albuterol (VENTOLIN HFA) 108 (90 Base) MCG/ACT inhaler INHALE 2 PUFFS INTO THE LUNGS EVERY 4 (FOUR) HOURS AS NEEDED FOR WHEEZING OR SHORTNESS OF BREATH   aspirin 81 MG EC tablet Take 81 mg by mouth at bedtime.    atorvastatin (LIPITOR) 40 MG tablet Take 1 tablet (40 mg total) by mouth at bedtime.   azelastine (ASTELIN) 0.1 % nasal spray Place 2 sprays into both nostrils 2 (two) times daily. Use in each nostril as directed   calcium-vitamin D (OSCAL WITH D) 500-200 MG-UNIT tablet Take 1 tablet by mouth 2 (two) times daily.   dorzolamide (TRUSOPT) 2 % ophthalmic solution 1 drop 2 (two) times daily.   furosemide (LASIX) 40 MG tablet Alternate 40 mg tablet with 20 mg tablet every other day.   latanoprost (XALATAN) 0.005 % ophthalmic solution Place 1 drop into both eyes at bedtime.   Multiple Vitamin (MULTIVITAMIN WITH MINERALS) TABS tablet Take 1 tablet by mouth at bedtime.   omeprazole (PRILOSEC) 40 MG capsule TAKE 1 CAPSULE (40 MG TOTAL) BY MOUTH DAILY.   sertraline (ZOLOFT) 25 MG tablet Take 25 mg by mouth daily.   SYMBICORT 160-4.5 MCG/ACT inhaler INHALE 2 PUFFS INTO THE LUNGS IN THE MORNING AND AT BEDTIME.   tamsulosin (FLOMAX) 0.4 MG CAPS capsule Take 1 capsule (0.4 mg total) by mouth daily.   venlafaxine XR (EFFEXOR-XR) 150 MG 24 hr capsule Take 1 capsule by mouth every morning.   vitamin C (ASCORBIC ACID) 500 MG tablet Take 500 mg by mouth at bedtime.    Vitamin D, Ergocalciferol, (DRISDOL) 1.25 MG (50000 UNIT) CAPS capsule TAKE 1 CAPSULE (50,000 UNITS TOTAL) BY MOUTH EVERY MONDAY. IN THE MORNING   Wheat Dextrin (BENEFIBER DRINK MIX PO) Take 1 Dose by  mouth daily as needed (for regularity). Use as directed daily   No facility-administered encounter medications on file as of 02/07/2021.    Allergies (verified) Patient has no known allergies.   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 Rehman:Examination performed to cecum Pan colonic diverticulosis/2 small polyps ablated via cold biopsy from transverse colon/External hemorrhoids, benign polyps   COLONOSCOPY N/A 06/29/2013   Procedure: COLONOSCOPY;  Surgeon: Daneil Dolin, MD;  [facility];  Service: Endoscopy;  Laterality: N/A;  10:45   EYE SURGERY     left eye cataracts    PACEMAKER IMPLANT N/A 03/24/2018   Procedure: PACEMAKER IMPLANT;  Surgeon: Evans Lance, MD;  Location: [place] [facility];  Service: Cardiovascular;  Laterality: N/A;   Family History  Problem Relation Age of Onset   Colon cancer Mother        diagnosed at age 68   Aneurysm Father    Aneurysm Brother    Stroke Brother    Social History   Socioeconomic History   Marital status: Married    Spouse name: Ceasar Lund    Number of children: 0   Years of education: 12   Highest education level: 12th grade  Occupational History   Occupation: retired  Tobacco Use   Smoking status: Never   Smokeless tobacco: Former    Types: Chew    Quit date: 03/13/2016  Vaping Use   Vaping Use: Never used  Substance and Sexual Activity   Alcohol use: No    Alcohol/week: 0.0 standard drinks   Drug use: No   Sexual activity: Not Currently  Other Topics Concern   Not on file  Social History Narrative   Not on file   Social Determinants of Health   Financial Resource Strain: Not on file  Food Insecurity: Not on file  Transportation Needs: Not on file  Physical Activity: Not on file  Stress: Not on file  Social Connections: Not on file    Tobacco Counseling Counseling given: Not Answered   Clinical Intake:                 Diabetic?No         Activities of Daily Living No flowsheet data found.  Patient Care Team: Fayrene Helper, MD as PCP - General (Family Medicine) Gala Romney Cristopher Estimable, MD as  Consulting Physician (Gastroenterology)  Indicate any recent Medical Services you may have received from other than [facility] providers in the past year (date may be approximate).     Assessment:   This is a routine wellness examination for [place].  Hearing/Vision screen No results found.  Dietary issues and exercise activities discussed:     Goals Addressed   None   Depression Screen PHQ 2/9 Scores 12/09/2020 10/19/2020 08/09/2020 04/20/2020 02/17/2020 02/02/2020 12/01/2019  PHQ - 2 Score 2 2 6 6 2 1 1   PHQ- 9 Score 3  8 10 14 4  - -    Fall Risk Fall Risk  12/09/2020 10/19/2020 08/09/2020 02/17/2020 02/02/2020  Falls in the past year? 0 0 0 0 0  Number falls in past yr: 0 0 0 0 0  Injury with Fall? 0 0 0 0 0  Risk for fall due to : No Fall Risks No Fall Risks - No Fall Risks No Fall Risks  Follow up Falls evaluation completed Falls evaluation completed - Falls evaluation completed Falls evaluation completed    [place]:  Any stairs in or around the home? No  If so, are there any without handrails?  N/a Home free of loose throw rugs in walkways, pet beds, electrical cords, etc? Yes  Adequate lighting in your home to reduce risk of falls? Yes   ASSISTIVE DEVICES UTILIZED TO PREVENT FALLS:  Life alert? No  Use of a cane, walker or w/c? No  Grab bars in the bathroom? Yes  Shower chair or bench in shower? Yes  Elevated toilet seat or a handicapped toilet? No   TIMED UP AND GO:  Was the test performed? No .  Length of time to ambulate 10 feet: n/a sec.     Cognitive Function:     6CIT Screen 02/02/2020 01/30/2019 01/27/2018 08/27/2016  What Year? 0 points 0 points 0 points 0 points  What month? 0 points 0 points 0 points 0 points  What time? 0 points 0 points 0 points 0 points  Count back from 20 0 points 0 points 0 points 0 points  Months in reverse 0 points 0 points 0 points 0 points  Repeat phrase 0 points 0 points 0 points 0 points  Total  Score 0 0 0 0    Immunizations Immunization History  Administered Date(s) Administered   Fluad Quad(high Dose 65+) 11/27/2018   Influenza Split 01/01/2013, 01/01/2014, 01/03/2018   Influenza Whole 01/06/2007, 01/04/2009, 12/23/2009, 12/14/2010   Influenza, High Dose Seasonal PF 01/03/2018   Influenza,inj,Quad PF,6+ Mos 12/14/2015, 12/03/2016, 01/28/2017, 12/01/2019   Influenza-Unspecified 01/31/2017, 12/07/2019, 01/19/2021   Moderna Sars-Covid-2 Vaccination 04/22/2019, 05/06/2019, 05/20/2019, 06/03/2019, 02/01/2020   Pneumococcal Conjugate-13 10/13/2013, 03/21/2015   Pneumococcal Polysaccharide-23 02/21/2004, 09/25/2016   Pneumococcal-Unspecified 01/07/2006   Td 12/23/2009   Tdap 04/04/2012, 02/20/2014   Zoster Recombinat (Shingrix) 01/10/2017, 03/11/2017   Zoster, Live 01/06/2007    TDAP status: Up to date  Flu Vaccine status: Up to date  Pneumococcal vaccine status: Up to date  Covid-19 vaccine status: Completed vaccines  Qualifies for Shingles Vaccine? Yes   Zostavax completed Yes   Shingrix Completed?: Yes  Screening Tests Health Maintenance  Topic Date Due   TETANUS/TDAP  02/21/2024   Pneumonia Vaccine 48+ Years old  Completed   INFLUENZA VACCINE  Completed   COVID-19 Vaccine  Completed   Zoster Vaccines- Shingrix  Completed   HPV VACCINES  Aged Out    Health Maintenance  There are no preventive care reminders to display for this patient.  Colorectal cancer screening: No longer required.   Lung Cancer Screening: (Low Dose CT Chest recommended if Age 24-80 years, 30 pack-year currently smoking OR have quit w/in 15years.) does not qualify.   Lung Cancer Screening Referral:   Additional Screening:  Hepatitis C Screening: does qualify; Completed no  Vision Screening: Recommended annual ophthalmology exams for early detection of glaucoma and other disorders of the eye. Is the patient up to date with their annual eye exam?  Yes  Who is the  provider or what  is the name of the office in which the patient attends annual eye exams? [facility] If pt is not established with a provider, would they like to be referred to a provider to establish care? No .   Dental Screening: Recommended annual dental exams for proper oral hygiene  Community Resource Referral / Chronic Care Management: CRR required this visit?  No   CCM required this visit?  No      Plan:     I have personally reviewed and noted the following in the patient's chart:   Medical and social history Use of alcohol, tobacco or illicit drugs  Current medications and supplements including opioid prescriptions. Patient is not currently taking opioid prescriptions. Functional ability and status Nutritional status Physical activity Advanced directives List of other physicians Hospitalizations, surgeries, and ER visits in previous 12 months Vitals Screenings to include cognitive, depression, and falls Referrals and appointments  In addition, I have reviewed and discussed with patient certain preventive protocols, quality metrics, and best practice recommendations. A written personalized care plan for preventive services as well as general preventive health recommendations were provided to patient.     Quentin Angst, [place]   02/07/2021   Nurse Notes: This is a tele health visit with the patient at home. The provider is in the office and is Rutwik Patel,MD

## 2021-02-07 NOTE — Patient Instructions (Signed)
Terry Love , Thank you for taking time to come for your Medicare Wellness Visit. I appreciate your ongoing commitment to your health goals. Please review the following plan we discussed and let me know if I can assist you in the future.   Screening recommendations/referrals: Colonoscopy: not due  Recommended yearly ophthalmology/optometry visit for glaucoma screening and checkup Recommended yearly dental visit for hygiene and checkup  Vaccinations: Influenza vaccine: not due  Pneumococcal vaccine: not due Tdap vaccine: not due  Shingles vaccine: not due     Advanced directives: yes   Conditions/risks identified: hyperlipidemia   Next appointment: 1 year   Preventive Care 31 Years and Older, Male Preventive care refers to lifestyle choices and visits with your health care provider that can promote health and wellness. What does preventive care include? A yearly physical exam. This is also called an annual well check. Dental exams once or twice a year. Routine eye exams. Ask your health care provider how often you should have your eyes checked. Personal lifestyle choices, including: Daily care of your teeth and gums. Regular physical activity. Eating a healthy diet. Avoiding tobacco and drug use. Limiting alcohol use. Practicing safe sex. Taking low doses of aspirin every day. Taking vitamin and mineral supplements as recommended by your health care provider. What happens during an annual well check? The services and screenings done by your health care provider during your annual well check will depend on your age, overall health, lifestyle risk factors, and family history of disease. Counseling  Your health care provider may ask you questions about your: Alcohol use. Tobacco use. Drug use. Emotional well-being. Home and relationship well-being. Sexual activity. Eating habits. History of falls. Memory and ability to understand (cognition). Work and work  Statistician. Screening  You may have the following tests or measurements: Height, weight, and BMI. Blood pressure. Lipid and cholesterol levels. These may be checked every 5 years, or more frequently if you are over 47 years old. Skin check. Lung cancer screening. You may have this screening every year starting at age 43 if you have a 30-pack-year history of smoking and currently smoke or have quit within the past 15 years. Fecal occult blood test (FOBT) of the stool. You may have this test every year starting at age 3. Flexible sigmoidoscopy or colonoscopy. You may have a sigmoidoscopy every 5 years or a colonoscopy every 10 years starting at age 59. Prostate cancer screening. Recommendations will vary depending on your family history and other risks. Hepatitis C blood test. Hepatitis B blood test. Sexually transmitted disease (STD) testing. Diabetes screening. This is done by checking your blood sugar (glucose) after you have not eaten for a while (fasting). You may have this done every 1-3 years. Abdominal aortic aneurysm (AAA) screening. You may need this if you are a current or former smoker. Osteoporosis. You may be screened starting at age 75 if you are at high risk. Talk with your health care provider about your test results, treatment options, and if necessary, the need for more tests. Vaccines  Your health care provider may recommend certain vaccines, such as: Influenza vaccine. This is recommended every year. Tetanus, diphtheria, and acellular pertussis (Tdap, Td) vaccine. You may need a Td booster every 10 years. Zoster vaccine. You may need this after age 63. Pneumococcal 13-valent conjugate (PCV13) vaccine. One dose is recommended after age 69. Pneumococcal polysaccharide (PPSV23) vaccine. One dose is recommended after age 63. Talk to your health care provider about which screenings and vaccines  you need and how often you need them. This information is not intended to replace  advice given to you by your health care provider. Make sure you discuss any questions you have with your health care provider. Document Released: 04/22/2015 Document Revised: 12/14/2015 Document Reviewed: 01/25/2015 Elsevier Interactive Patient Education  2017 [employer] Prevention in the Home Falls can cause injuries. They can happen to people of all ages. There are many things you can do to make your home safe and to help prevent falls. What can I do on the outside of my home? Regularly fix the edges of walkways and driveways and fix any cracks. Remove anything that might make you trip as you walk through a door, such as a raised step or threshold. Trim any bushes or trees on the path to your home. Use bright outdoor lighting. Clear any walking paths of anything that might make someone trip, such as rocks or tools. Regularly check to see if handrails are loose or broken. Make sure that both sides of any steps have handrails. Any raised decks and porches should have guardrails on the edges. Have any leaves, snow, or ice cleared regularly. Use sand or salt on walking paths during winter. Clean up any spills in your garage right away. This includes oil or grease spills. What can I do in the bathroom? Use night lights. Install grab bars by the toilet and in the tub and shower. Do not use towel bars as grab bars. Use non-skid mats or decals in the tub or shower. If you need to sit down in the shower, use a plastic, non-slip stool. Keep the floor dry. Clean up any water that spills on the floor as soon as it happens. Remove soap buildup in the tub or shower regularly. Attach bath mats securely with double-sided non-slip rug tape. Do not have throw rugs and other things on the floor that can make you trip. What can I do in the bedroom? Use night lights. Make sure that you have a light by your bed that is easy to reach. Do not use any sheets or blankets that are too big for your bed.  They should not hang down onto the floor. Have a firm chair that has side arms. You can use this for support while you get dressed. Do not have throw rugs and other things on the floor that can make you trip. What can I do in the kitchen? Clean up any spills right away. Avoid walking on wet floors. Keep items that you use a lot in easy-to-reach places. If you need to reach something above you, use a strong step stool that has a grab bar. Keep electrical cords out of the way. Do not use floor polish or wax that makes floors slippery. If you must use wax, use non-skid floor wax. Do not have throw rugs and other things on the floor that can make you trip. What can I do with my stairs? Do not leave any items on the stairs. Make sure that there are handrails on both sides of the stairs and use them. Fix handrails that are broken or loose. Make sure that handrails are as long as the stairways. Check any carpeting to make sure that it is firmly attached to the stairs. Fix any carpet that is loose or worn. Avoid having throw rugs at the top or bottom of the stairs. If you do have throw rugs, attach them to the floor with carpet tape. Make sure  that you have a light switch at the top of the stairs and the bottom of the stairs. If you do not have them, ask someone to add them for you. What else can I do to help prevent falls? Wear shoes that: Do not have high heels. Have rubber bottoms. Are comfortable and fit you well. Are closed at the toe. Do not wear sandals. If you use a stepladder: Make sure that it is fully opened. Do not climb a closed stepladder. Make sure that both sides of the stepladder are locked into place. Ask someone to hold it for you, if possible. Clearly mark and make sure that you can see: Any grab bars or handrails. First and last steps. Where the edge of each step is. Use tools that help you move around (mobility aids) if they are needed. These  include: Canes. Walkers. Scooters. Crutches. Turn on the lights when you go into a dark area. Replace any light bulbs as soon as they burn out. Set up your furniture so you have a clear path. Avoid moving your furniture around. If any of your floors are uneven, fix them. If there are any pets around you, be aware of where they are. Review your medicines with your doctor. Some medicines can make you feel dizzy. This can increase your chance of falling. Ask your doctor what other things that you can do to help prevent falls. This information is not intended to replace advice given to you by your health care provider. Make sure you discuss any questions you have with your health care provider. Document Released: 01/20/2009 Document Revised: 09/01/2015 Document Reviewed: 04/30/2014 Elsevier Interactive Patient Education  2017 [employer].

## 2021-02-15 ENCOUNTER — Telehealth: Payer: Self-pay

## 2021-02-15 NOTE — Telephone Encounter (Signed)
I spoke with the patient wife and Biotronik. The patient needs to come into the office to be reset with the programmer for his monitor to work. I told him the scheduler would give them a call back to set the appointment up. He can be on device clinic schedule or app.

## 2021-02-16 NOTE — Progress Notes (Signed)
Electrophysiology Office Note Date: 02/17/2021  ID:  KYAN YURKOVICH, DOB Aug 18, 1935, MRN [id number]  PCP: Fayrene Helper, MD Primary Cardiologist: None Electrophysiologist: Cristopher Peru, MD   CC: Pacemaker follow-up  AHMON TOSI is a 85 y.o. male seen today for Cristopher Peru, MD for acute visit due to failure to transmit remotely from home .  Since last being seen in our clinic the patient reports doing OK. He has had more SOB over the past several weeks. His Lasix was recently cut back to 40 mg alternating with 20 mg daily. Mild orthopnea. SOB with bathing and changing clothes. INtermittent peripheral edema. No chest pain or syncope.   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 Rehman:Examination performed to cecum Pan colonic diverticulosis/2 small polyps ablated via cold biopsy from transverse colon/External hemorrhoids, benign polyps   COLONOSCOPY N/A 06/29/2013   Procedure: COLONOSCOPY;  Surgeon: Daneil Dolin, MD;  Location[facility];  Service: Endoscopy;  Laterality: N/A;  10:45   EYE SURGERY     left eye cataracts    PACEMAKER IMPLANT N/A 03/24/2018   Procedure: PACEMAKER IMPLANT;  Surgeon: Evans Lance, MD;  Location: [facility];  Service: Cardiovascular;  Laterality: N/A;    Current Outpatient Medications  Medication Sig Dispense Refill   albuterol (VENTOLIN HFA) 108 (90 Base) MCG/ACT inhaler INHALE 2 PUFFS INTO THE LUNGS EVERY 4 (FOUR) HOURS AS NEEDED FOR WHEEZING OR  SHORTNESS OF BREATH 18 g 11   aspirin 81 MG EC tablet Take 81 mg by mouth at bedtime.     atorvastatin (LIPITOR) 40 MG tablet Take 1 tablet (40 mg total) by mouth at bedtime. 90 tablet 3   azelastine (ASTELIN) 0.1 % nasal spray Place 2 sprays into both nostrils 2 (two) times daily. Use in each nostril as directed 30 mL 12   calcium-vitamin D (OSCAL WITH D) 500-200 MG-UNIT tablet Take 1 tablet by mouth 2 (two) times daily. 150 tablet 1   carboxymethylcellulose (REFRESH PLUS) 0.5 % SOLN INSTILL 1 DROP IN EACH EYE FOUR TIMES A DAY     dorzolamide (TRUSOPT) 2 % ophthalmic solution 1 drop 2 (two) times daily.     dorzolamide-timolol (COSOPT) 22.3-6.8 MG/ML ophthalmic solution INSTILL 1 DROP IN [facility] EYE TWO TIMES A DAY     furosemide (LASIX) 40 MG tablet Alternate 40 mg tablet with 20 mg tablet every other day. 90 tablet 3   latanoprost (XALATAN) 0.005 % ophthalmic solution Place 1 drop into both eyes at bedtime.     Multiple Vitamin (MULTIVITAMIN WITH MINERALS) TABS tablet Take 1 tablet by mouth at bedtime.     omeprazole (PRILOSEC) 40 MG capsule TAKE 1 CAPSULE (40 MG TOTAL) BY MOUTH DAILY. 30 capsule 1   SYMBICORT 160-4.5 MCG/ACT inhaler INHALE 2 PUFFS INTO THE LUNGS IN THE MORNING AND  AT BEDTIME. 10.2 each 6   tamsulosin (FLOMAX) 0.4 MG CAPS capsule Take 1 capsule (0.4 mg total) by mouth daily. 90 capsule 3   venlafaxine XR (EFFEXOR-XR) 150 MG 24 hr capsule Take 1 capsule by mouth every morning.     vitamin C (ASCORBIC ACID) 500 MG tablet Take 500 mg by mouth at bedtime.      Vitamin D, Ergocalciferol, (DRISDOL) 1.25 MG (50000 UNIT) CAPS capsule TAKE 1 CAPSULE (50,000 UNITS TOTAL) BY MOUTH EVERY MONDAY. IN THE MORNING 12 capsule 1   Wheat Dextrin (BENEFIBER DRINK MIX PO) Take 1 Dose by mouth daily as needed (for regularity). Use as directed daily     sertraline (ZOLOFT) 25 MG tablet Take 25 mg by mouth daily. (Patient not taking: Reported on 02/17/2021)     No current facility-administered  medications for this visit.    Allergies:   Patient has no known allergies.   Social History: Social History   Socioeconomic History   Marital status: Married    Spouse name: Ivania    Number of children: 0   Years of education: 12   Highest education level: 12th grade  Occupational History   Occupation: retired  Tobacco Use   Smoking status: Never   Smokeless tobacco: Former    Types: Chew    Quit date: 03/13/2016  Vaping Use   Vaping Use: Never used  Substance and Sexual Activity   Alcohol use: No    Alcohol/week: 0.0 standard drinks   Drug use: No   Sexual activity: Not Currently  Other Topics Concern   Not on file  Social History Narrative   Not on file   Social Determinants of Health   Financial Resource Strain: Low Risk    Difficulty of Paying Living Expenses: Not hard at all  Food Insecurity: No Food Insecurity   Worried About Charity fundraiser in the Last Year: Never true   [place] in the Last Year: Never true  Transportation Needs: No Transportation Needs   Lack of Transportation (Medical): No   Lack of Transportation (Non-Medical): No  Physical Activity: Insufficiently Active   Days of Exercise per Week: 3 days   Minutes of Exercise per Session: 20 min  Stress: No Stress Concern Present   Feeling of Stress : Not at all  Social Connections: Socially Integrated   Frequency of Communication with Friends and Family: More than three times a week   Frequency of Social Gatherings with Friends and Family: More than three times a week   Attends Religious Services: More than 4 times per year   Active Member of [employer] or Organizations: Yes   Attends Music therapist: More than 4 times per year   Marital Status: Married  Human resources officer Violence: Not At Risk   Fear of Current or Ex-Partner: No   Emotionally Abused: No   Physically Abused: No   Sexually Abused: No    Family History: Family History  Problem Relation Age of Onset    Colon cancer Mother        diagnosed at age 87   Aneurysm Father    Aneurysm Brother    Stroke Brother      Review of Systems: All other systems reviewed and are otherwise negative except as noted above.  Physical Exam: Vitals:   02/17/21 0805  BP: 138/80  Pulse: 80  SpO2: 96%  Weight: 221 lb 9.6 oz (100.5 kg)  Height: 5\' 10"  (1.778 m)  GEN- The patient is well appearing, alert and oriented x 3 today.   HEENT: normocephalic, atraumatic; sclera clear, conjunctiva pink; hearing intact; oropharynx clear; neck supple  Lungs- Clear to ausculation bilaterally, normal work of breathing.  No wheezes, rales, rhonchi Heart- Regular rate and rhythm, no murmurs, rubs or gallops  GI- soft, non-tender, non-distended, bowel sounds present  Extremities- no clubbing or cyanosis. No edema MS- no significant deformity or atrophy Skin- warm and dry, no rash or lesion; PPM pocket well healed Psych- euthymic mood, full affect Neuro- strength and sensation are intact  PPM Interrogation- reviewed in detail today,  See PACEART report  EKG:  EKG is not ordered today.  Recent Labs: 05/11/2020: TSH 1.860 06/27/2020: NT-Pro BNP 47 10/28/2020: ALT 11; BUN 12; Creatinine, Ser 0.70; Hemoglobin 12.3; Platelets 267; Potassium 4.1; Sodium 143   Wt Readings from Last 3 Encounters:  02/17/21 221 lb 9.6 oz (100.5 kg)  12/15/20 210 lb (95.3 kg)  11/03/20 219 lb 12.8 oz (99.7 kg)     Other studies Reviewed: Additional studies/ records that were reviewed today include: Previous EP office notes, Previous remote checks, Most recent labwork.   Assessment and Plan:  1. SND s/p Biotronik PPM  Normal PPM function See Claudia Desanctis Art report No changes today His device is not transmitting. He did not bring his home monitor piece, which is required to reset it. Will reschedule at their convenience to have this done.  2. Obesity Body mass index is 31.8 kg/m.  Have encouraged lifestyle management.  3. Chronic  diastolic CHF Volume status somewhat elevated on exam Increase lasix 40 mg back to daily, with extra 20 mg as needed  Current medicines are reviewed at length with the patient today.    Labs/ tests ordered today include:  Orders Placed This Encounter  Procedures   Basic metabolic panel   Pro b natriuretic peptide (BNP)   Basic metabolic panel   CUP PACEART INCLINIC DEVICE CHECK     Disposition:   Follow up with EP APP in 6 months for usual follow up, within next few weeks to couple of months to get his monitor reset. Episodes have been stable recently and they live around an hour away.     Jacalyn Lefevre, PA-C  02/17/2021 8:12 AM  [facility] [address] [place] [address] [phone] (office) [phone] (fax)

## 2021-02-17 ENCOUNTER — Ambulatory Visit (INDEPENDENT_AMBULATORY_CARE_PROVIDER_SITE_OTHER): Payer: Medicare Other | Admitting: Student

## 2021-02-17 ENCOUNTER — Other Ambulatory Visit: Payer: Self-pay

## 2021-02-17 ENCOUNTER — Encounter: Payer: Self-pay | Admitting: Student

## 2021-02-17 VITALS — BP 138/80 | HR 80 | Ht 70.0 in | Wt 221.6 lb

## 2021-02-17 DIAGNOSIS — R062 Wheezing: Secondary | ICD-10-CM | POA: Diagnosis not present

## 2021-02-17 DIAGNOSIS — R001 Bradycardia, unspecified: Secondary | ICD-10-CM | POA: Diagnosis not present

## 2021-02-17 DIAGNOSIS — I495 Sick sinus syndrome: Secondary | ICD-10-CM

## 2021-02-17 LAB — CUP PACEART INCLINIC DEVICE CHECK
Date Time Interrogation Session: 20221111093409
Implantable Lead Implant Date: 20191216
Implantable Lead Implant Date: 20191216
Implantable Lead Location: 753859
Implantable Lead Location: 753860
Implantable Lead Model: 377
Implantable Lead Model: 377
Implantable Lead Serial Number: 80840322
Implantable Lead Serial Number: 80924555
Implantable Pulse Generator Implant Date: 20191216
Lead Channel Impedance Value: 468 Ohm
Lead Channel Impedance Value: 526 Ohm
Lead Channel Pacing Threshold Amplitude: 0.4 V
Lead Channel Pacing Threshold Amplitude: 0.4 V
Lead Channel Pacing Threshold Amplitude: 1.1 V
Lead Channel Pacing Threshold Amplitude: 1.2 V
Lead Channel Pacing Threshold Amplitude: 1.2 V
Lead Channel Pacing Threshold Amplitude: 1.6 V
Lead Channel Pacing Threshold Pulse Width: 0.4 ms
Lead Channel Pacing Threshold Pulse Width: 0.4 ms
Lead Channel Pacing Threshold Pulse Width: 0.4 ms
Lead Channel Pacing Threshold Pulse Width: 0.4 ms
Lead Channel Pacing Threshold Pulse Width: 0.4 ms
Lead Channel Pacing Threshold Pulse Width: 0.4 ms
Lead Channel Sensing Intrinsic Amplitude: 1.4 mV
Lead Channel Sensing Intrinsic Amplitude: 1.9 mV
Lead Channel Sensing Intrinsic Amplitude: 7 mV
Lead Channel Sensing Intrinsic Amplitude: 7.4 mV
Lead Channel Setting Pacing Amplitude: 2 V
Lead Channel Setting Pacing Amplitude: 2.4 V
Lead Channel Setting Pacing Pulse Width: 0.4 ms
Pulse Gen Model: 407145
Pulse Gen Serial Number: 69486427

## 2021-02-17 MED ORDER — FUROSEMIDE 40 MG PO TABS: 40.0000 mg | ORAL_TABLET | Freq: Every day | ORAL | 3 refills | Status: DC

## 2021-02-17 NOTE — Patient Instructions (Addendum)
Medication Instructions:   Your physician has recommended you make the following change in your medication:   INCREASE: Lasix (Furosemide) to 40mg  daily  *If you need a refill on your cardiac medications before your next appointment, please call your pharmacy*   Lab Work: TODAY: BMET, BNP BMET on 11/21 - Lab is open 7:30am - 4:30pm  If you have labs (blood work) drawn today and your tests are completely normal, you will receive your results only by: [place] (if you have MyChart) OR A paper copy in the mail If you have any lab test that is abnormal or we need to change your treatment, we will call you to review the results.   Follow-Up: At [facility], you and your health needs are our priority.  As part of our continuing mission to provide you with exceptional heart care, we have created designated Provider Care Teams.  These Care Teams include your primary Cardiologist (physician) and Advanced Practice Providers (APPs -  Physician Assistants and Nurse Practitioners) who all work together to provide you with the care you need, when you need it.  We recommend signing up for the patient portal called "MyChart".  Sign up information is provided on this After Visit Summary.  MyChart is used to connect with patients for Virtual Visits (Telemedicine).  Patients are able to view lab/test results, encounter notes, upcoming appointments, etc.  Non-urgent messages can be sent to your provider as well.   To learn more about what you can do with MyChart, go to [email or web address].    Your next appointment:   6 month(s)  The format for your next appointment:   In Person  Provider:   Legrand Como "Oda Kilts, PA-C

## 2021-02-18 LAB — BASIC METABOLIC PANEL
BUN/Creatinine Ratio: 23 (ref 10–24)
BUN: 16 mg/dL (ref 8–27)
CO2: 23 mmol/L (ref 20–29)
Calcium: 9 mg/dL (ref 8.6–10.2)
Chloride: 107 mmol/L — ABNORMAL HIGH (ref 96–106)
Creatinine, Ser: 0.7 mg/dL — ABNORMAL LOW (ref 0.76–1.27)
Glucose: 73 mg/dL (ref 70–99)
Potassium: 4 mmol/L (ref 3.5–5.2)
Sodium: 142 mmol/L (ref 134–144)
eGFR: 91 mL/min/{1.73_m2} (ref >59)

## 2021-02-18 LAB — PRO B NATRIURETIC PEPTIDE: NT-Pro BNP: 49 pg/mL (ref 0–486)

## 2021-02-23 ENCOUNTER — Other Ambulatory Visit: Payer: Self-pay

## 2021-02-23 ENCOUNTER — Encounter: Payer: Self-pay | Admitting: Family Medicine

## 2021-02-23 ENCOUNTER — Ambulatory Visit (INDEPENDENT_AMBULATORY_CARE_PROVIDER_SITE_OTHER): Payer: Medicare Other | Admitting: Family Medicine

## 2021-02-23 ENCOUNTER — Ambulatory Visit (HOSPITAL_COMMUNITY)
Admission: RE | Admit: 2021-02-23 | Discharge: 2021-02-23 | Disposition: A | Discharge status: Home / Self Care | Payer: Medicare Other | Source: Ambulatory Visit | Attending: Family Medicine | Admitting: Family Medicine

## 2021-02-23 VITALS — BP 130/80 | HR 65 | Resp 17 | Ht 70.0 in | Wt 218.0 lb

## 2021-02-23 DIAGNOSIS — F322 Major depressive disorder, single episode, severe without psychotic features: Secondary | ICD-10-CM | POA: Diagnosis not present

## 2021-02-23 DIAGNOSIS — R0602 Shortness of breath: Secondary | ICD-10-CM | POA: Insufficient documentation

## 2021-02-23 DIAGNOSIS — R0989 Other specified symptoms and signs involving the circulatory and respiratory systems: Secondary | ICD-10-CM

## 2021-02-23 DIAGNOSIS — T733XXA Exhaustion due to excessive exertion, initial encounter: Secondary | ICD-10-CM

## 2021-02-23 NOTE — Patient Instructions (Addendum)
Please reschedule December follow up to mid January, call if  you need me sooner  You are referred to Cardiology  CXR today due to shortness of breath and post covid cough  Normal bP is less than 120/80.  High blood pressure is 140/90 or more  Thanks for choosing [facility], we consider it a privelige to serve you.

## 2021-02-27 ENCOUNTER — Encounter: Payer: Self-pay | Admitting: Family Medicine

## 2021-02-27 ENCOUNTER — Other Ambulatory Visit: Payer: Medicare Other

## 2021-02-27 NOTE — Assessment & Plan Note (Signed)
Reports intermittent elevated blood pressure readings at home with increased exertional dyspne and poor exercise tolerance, refer Cardiology

## 2021-02-27 NOTE — Assessment & Plan Note (Signed)
Increased exertionalfatigue and poor exercise tolerance noted, majot concern by spoiuse and pt in ligfht of recent Covid infection, will obtain CXr and refer to cardiology

## 2021-02-27 NOTE — Assessment & Plan Note (Signed)
Severe and uncontrolled, not actively suicidal or homicidal, being treated through the [place]

## 2021-02-27 NOTE — Assessment & Plan Note (Addendum)
Increased fatigue and poor exercise tolerance, refer to Cardiology, and obtain CXR

## 2021-02-27 NOTE — Progress Notes (Signed)
   Terry Love     MRN: [id number]      DOB: [birth year]   HPI Terry Love is here with c/o increased shortness  of breath, and fatigue following covid He denies fever chills or productive cough, does c/o post covid dry cough Also new concerns about isolated elevated blood pressure readings ROS Denies recent fever or chills. Denies sinus pressure, nasal congestion, ear pain or sore throat. Denies chest congestion, productive cough or wheezing. Denies chest pains, palpitations and leg swelling Denies abdominal pain, nausea, vomiting,diarrhea or constipation.   Denies dysuria, frequency, hesitancy or incontinence. Denies joint pain, swelling and limitation in mobility. Denies headaches, seizures, numbness, or tingling. Denies depression, anxiety or insomnia. Denies skin break down or rash.   PE  BP 130/80   Pulse 65   Resp 17   Ht 5\' 10"  (1.778 m)   Wt 218 lb (98.9 kg)   SpO2 95%   BMI 31.28 kg/m   Patient alert and oriented and in no cardiopulmonary distress.  HEENT: No facial asymmetry, EOMI,     Neck supple .  Chest: Clear to auscultation bilaterally.  CVS: S1, S2 no murmurs, no S3.Regular rate.  ABD: Soft non tender.   Ext: No edema  MS: Adequate ROM spine, shoulders, hips and knees.  Skin: Intact, no ulcerations or rash noted.  Psych: Good eye contact, flat  affect. Memory intact not anxious , depressed appearing.  CNS: CN 2-12 intact, power,  normal throughout.no focal deficits noted.   Assessment & Plan  Labile blood pressure Reports intermittent elevated blood pressure readings at home with increased exertional dyspne and poor exercise tolerance, refer Cardiology  Shortness of breath Increased fatigue and poor exercise tolerance, refer to Cardiology, and obtain CXR  Fatigue Increased exertionalfatigue and poor exercise tolerance noted, majot concern by spoiuse and pt in ligfht of recent Covid infection, will obtain CXr and refer to  cardiology  Depression, major, single episode, severe ([place]) Severe and uncontrolled, not actively suicidal or homicidal, being treated through the [place]

## 2021-03-01 ENCOUNTER — Ambulatory Visit (INDEPENDENT_AMBULATORY_CARE_PROVIDER_SITE_OTHER): Payer: Medicare Other | Admitting: Student

## 2021-03-01 ENCOUNTER — Other Ambulatory Visit: Payer: Self-pay

## 2021-03-01 ENCOUNTER — Other Ambulatory Visit: Payer: Medicare Other | Admitting: *Deleted

## 2021-03-01 DIAGNOSIS — R001 Bradycardia, unspecified: Secondary | ICD-10-CM | POA: Diagnosis not present

## 2021-03-01 DIAGNOSIS — I495 Sick sinus syndrome: Secondary | ICD-10-CM

## 2021-03-01 LAB — BASIC METABOLIC PANEL
BUN/Creatinine Ratio: 17 (ref 10–24)
BUN: 15 mg/dL (ref 8–27)
CO2: 24 mmol/L (ref 20–29)
Calcium: 9.3 mg/dL (ref 8.6–10.2)
Chloride: 104 mmol/L (ref 96–106)
Creatinine, Ser: 0.89 mg/dL (ref 0.76–1.27)
Glucose: 76 mg/dL (ref 70–99)
Potassium: 4.7 mmol/L (ref 3.5–5.2)
Sodium: 140 mmol/L (ref 134–144)
eGFR: 85 mL/min/{1.73_m2} (ref >59)

## 2021-03-01 NOTE — Patient Instructions (Signed)
Medication Instructions:  Your physician recommends that you continue on your current medications as directed. Please refer to the Current Medication list given to you today.  *If you need a refill on your cardiac medications before your next appointment, please call your pharmacy*   Lab Work: None ordered.  If you have labs (blood work) drawn today and your tests are completely normal, you will receive your results only by: . MyChart Message (if you have MyChart) OR . A paper copy in the mail If you have any lab test that is abnormal or we need to change your treatment, we will call you to review the results.   Testing/Procedures: None ordered.    Follow-Up: At [facility], you and your health needs are our priority.  As part of our continuing mission to provide you with exceptional heart care, we have created designated Provider Care Teams.  These Care Teams include your primary Cardiologist (physician) and Advanced Practice Providers (APPs -  Physician Assistants and Nurse Practitioners) who all work together to provide you with the care you need, when you need it.  We recommend signing up for the patient portal called "MyChart".  Sign up information is provided on this After Visit Summary.  MyChart is used to connect with patients for Virtual Visits (Telemedicine).  Patients are able to view lab/test results, encounter notes, upcoming appointments, etc.  Non-urgent messages can be sent to your provider as well.   To learn more about what you can do with MyChart, go to [email or web address].    Your next appointment:   As scheduled  

## 2021-03-01 NOTE — Progress Notes (Signed)
   Home monitoring reset as per [employer] instructions  Confirmed with [employer] device is now transmitting appropriately, last message this am.   Legrand Como "Oda [place], [place]  03/01/2021 9:48 AM

## 2021-03-03 ENCOUNTER — Other Ambulatory Visit: Payer: Self-pay | Admitting: Family Medicine

## 2021-03-04 ENCOUNTER — Other Ambulatory Visit: Payer: Self-pay | Admitting: Family Medicine

## 2021-03-07 ENCOUNTER — Ambulatory Visit: Payer: Medicare Other | Admitting: Urology

## 2021-03-08 ENCOUNTER — Other Ambulatory Visit: Payer: Self-pay | Admitting: Family Medicine

## 2021-03-09 ENCOUNTER — Ambulatory Visit: Payer: Medicare Other | Admitting: Family Medicine

## 2021-03-20 ENCOUNTER — Other Ambulatory Visit: Payer: Self-pay | Admitting: Family Medicine

## 2021-03-20 ENCOUNTER — Telehealth: Payer: Self-pay

## 2021-03-20 NOTE — Telephone Encounter (Signed)
Does he usually take this?

## 2021-03-20 NOTE — Telephone Encounter (Signed)
Patient need med refill benzonatate 100 mg  Pharmacy: [facility] 

## 2021-03-21 ENCOUNTER — Other Ambulatory Visit: Payer: Self-pay | Admitting: Family Medicine

## 2021-03-21 MED ORDER — BENZONATATE 100 MG PO CAPS: 100.0000 mg | ORAL_CAPSULE | Freq: Two times a day (BID) | ORAL | 4 refills | Status: DC | PRN

## 2021-03-21 NOTE — Progress Notes (Signed)
Tessalon perle prescribed

## 2021-03-21 NOTE — Telephone Encounter (Signed)
Left VM that Rx was sent to pharmacy.  

## 2021-03-24 ENCOUNTER — Ambulatory Visit: Payer: Medicare Other | Admitting: Family Medicine

## 2021-03-30 LAB — CUP PACEART REMOTE DEVICE CHECK
Date Time Interrogation Session: 20221222080902
Implantable Lead Implant Date: 20191216
Implantable Lead Implant Date: 20191216
Implantable Lead Location: 753859
Implantable Lead Location: 753860
Implantable Lead Model: 377
Implantable Lead Model: 377
Implantable Lead Serial Number: 80840322
Implantable Lead Serial Number: 80924555
Implantable Pulse Generator Implant Date: 20191216
Pulse Gen Model: 407145
Pulse Gen Serial Number: 69486427

## 2021-04-05 ENCOUNTER — Ambulatory Visit (INDEPENDENT_AMBULATORY_CARE_PROVIDER_SITE_OTHER): Payer: Medicare Other

## 2021-04-05 DIAGNOSIS — I495 Sick sinus syndrome: Secondary | ICD-10-CM | POA: Diagnosis not present

## 2021-04-14 NOTE — Progress Notes (Signed)
Remote pacemaker transmission.   

## 2021-04-17 ENCOUNTER — Other Ambulatory Visit: Payer: Self-pay

## 2021-04-17 ENCOUNTER — Encounter (HOSPITAL_COMMUNITY): Payer: Self-pay | Admitting: Physical Therapy

## 2021-04-17 ENCOUNTER — Ambulatory Visit (HOSPITAL_COMMUNITY): Payer: Medicare Other | Attending: Family Medicine | Admitting: Physical Therapy

## 2021-04-17 DIAGNOSIS — M6281 Muscle weakness (generalized): Secondary | ICD-10-CM | POA: Diagnosis not present

## 2021-04-17 DIAGNOSIS — R2689 Other abnormalities of gait and mobility: Secondary | ICD-10-CM | POA: Insufficient documentation

## 2021-04-17 NOTE — Therapy (Signed)
[place], [place], [address] Phone: [phone]   Fax:  [phone]  Physical Therapy Evaluation  Patient Details  Name: Terry Love MRN: [id number] Date of Birth: 1935/07/24 Referring Provider (PT): Ronnie Derby MD   Encounter Date: 04/17/2021   PT End of Session - 04/17/21 1456     Visit Number 1    Number of Visits 12    Date for PT Re-Evaluation 05/29/21    Authorization Type Primary Medicare Secondary BCBS supplemental    Progress Note Due on Visit 10    PT Start Time 1445    PT Stop Time 1515    PT Time Calculation (min) 30 min    Activity Tolerance Patient tolerated treatment well    Behavior During Therapy [facility] for tasks assessed/performed             Past Medical History:  Diagnosis Date   Anxiety    Asthma    Bradycardia    Chronic    Colon polyps    Depression    Facial tic    Glaucoma    Hearing loss    Heart disease    High cholesterol    Hyperlipidemia    Insomnia secondary to depression with anxiety 03/2015   Obesity    Seasonal allergies     Past Surgical History:  Procedure Laterality Date   carpal tunnel  release right  2006   COLONOSCOPY     COLONOSCOPY  01/25/2011   Dr. Rehman:Examination performed to cecum Pan colonic diverticulosis/2 small polyps ablated via cold biopsy from transverse colon/External hemorrhoids, benign polyps   COLONOSCOPY N/A 06/29/2013   Procedure: COLONOSCOPY;  Surgeon: Daneil Dolin, MD;  [facility];  Service: Endoscopy;  Laterality: N/A;  10:45   EYE SURGERY     left eye cataracts    PACEMAKER IMPLANT N/A 03/24/2018   Procedure: PACEMAKER IMPLANT;  Surgeon: Evans Lance, MD;  Location: [place] [facility];  Service: Cardiovascular;  Laterality: N/A;    There were no vitals filed for this visit.    Subjective Assessment - 04/17/21 1446     Subjective Patient is a 86 y.o. male who presents to physical therapy with  referral for ambulatory dysfunction. He has not had any falls. He cant hold his balance too good. His joints have been tight when getting up. Trouble with R wrist. He has trouble standing up straight. Patient states his main goal is to improve walking and balance.    Pertinent History pacemaker    Limitations Standing;House hold activities;Walking    Patient Stated Goals improve gait and balance and improve health    Currently in Pain? No/denies                [facility] PT Assessment - 04/17/21 0001       Assessment   Medical Diagnosis Ambulatory Dysfunction    Referring Provider (PT) Ronnie Derby MD    Prior Therapy Wrists      Precautions   Precautions Fall      Restrictions   Weight Bearing Restrictions No      Balance Screen   Has the patient fallen in the past 6 months No    Has the patient had a decrease in activity level because of a fear of falling?  No    Is the patient reluctant to leave their home because of a fear of falling?  No  Prior Function   Level of Independence Independent    Vocation Retired      Charity fundraiser Status Within Functional Limits for tasks assessed      Observation/Other Assessments   Observations Ambulates without AD, unsteady    Focus on Therapeutic Outcomes (FOTO)  n/a      ROM / Strength   AROM / PROM / Strength Strength      Strength   Strength Assessment Site Hip;Knee;Ankle    Right/Left Hip Right;Left    Right Hip Flexion 4+/5    Left Hip Flexion 4+/5    Right/Left Knee Right;Left    Right Knee Flexion 5/5    Right Knee Extension 5/5    Left Knee Flexion 5/5    Left Knee Extension 5/5    Right/Left Ankle Right;Left    Right Ankle Dorsiflexion 3+/5    Left Ankle Dorsiflexion 3+/5      Transfers   Comments labored      Ambulation/Gait   Ambulation/Gait Yes    Ambulation Distance (Feet) 226 Feet    Assistive device None    Gait Pattern Trunk flexed;Wide base of support;Poor foot clearance -  left;Poor foot clearance - right    Ambulation Surface Level;Indoor    Gait Comments 2MWT, had to stop at 1:44 due to fatigue                        Objective measurements completed on examination: See above findings.                PT Education - 04/17/21 1446     Education Details Patient educated on exam findings, POC, scope of PT, HEP    Person(s) Educated Patient    Methods Explanation;Demonstration;Handout    Comprehension Verbalized understanding;Returned demonstration              PT Short Term Goals - 04/17/21 1523       PT SHORT TERM GOAL #1   Title Patient will be independent with HEP in order to improve functional outcomes.    Time 3    Period Weeks    Status New    Target Date 05/08/21      PT SHORT TERM GOAL #2   Title Patient will report at least 25% improvement in symptoms for improved quality of life.    Time 3    Period Weeks    Status New    Target Date 05/08/21               PT Long Term Goals - 04/17/21 1524       PT LONG TERM GOAL #1   Title Patient will report at least 75% improvement in symptoms for improved quality of life.    Time 6    Period Weeks    Status New    Target Date 05/29/21      PT LONG TERM GOAL #2   Title Patient will be able to ambulate at least 300 feet in 2MWT in order to demonstrate improved gait speed for community ambulation.    Time 6    Period Weeks    Status New    Target Date 05/29/21      PT LONG TERM GOAL #3   Title Patient will be able to complete 5x STS in under 15 seconds in order to reduce the risk of falls.    Time 6    Period Weeks  Status New    Target Date 05/29/21                    Plan - 04/17/21 1517     Clinical Impression Statement Patient is a 86 y.o. male who presents to physical therapy with referral for ambulatory dysfunction. He presents with deficits in bilateral LE strength, ROM, endurance, gait, balance, and functional mobility with  ADL. He is having to modify and restrict ADL as indicated by subjective information and objective measures which is affecting overall participation. Patient will benefit from skilled physical therapy in order to improve function and reduce impairment.    Personal Factors and Comorbidities Comorbidity 3+;Fitness;Time since onset of injury/illness/exacerbation    Comorbidities anxiety, depression, cardiac/pacemaker, decreased endurance    Examination-Activity Limitations Squat;Stairs;Stand;Transfers;Lift;Locomotion Level;Hygiene/Grooming;Caring for Others    Examination-Participation Restrictions Lawyer;Shop;Cleaning;Community Activity;Meal Prep    Stability/Clinical Decision Making Stable/Uncomplicated    Clinical Decision Making Low    Rehab Potential Good    PT Frequency 2x / week    PT Duration 6 weeks    PT Treatment/Interventions ADLs/Self Care Home Management;Aquatic Therapy;Electrical Stimulation;Cryotherapy;Moist Heat;Traction;Ultrasound;Parrafin;Fluidtherapy;Contrast Bath;DME Instruction;Gait training;Stair training;Functional mobility training;Therapeutic activities;Therapeutic exercise;Balance training;Neuromuscular re-education;Patient/family education;Orthotic Fit/Training;Manual techniques;Compression bandaging;Scar mobilization;Dry needling;Energy conservation;Splinting;Taping;Spinal Manipulations;Joint Manipulations    PT Next Visit Plan gait and balance training, endurance    PT Home Exercise Plan hip abduction, seated march    Consulted and Agree with Plan of Care Patient             Patient will benefit from skilled therapeutic intervention in order to improve the following deficits and impairments:  Abnormal gait, Decreased endurance, Decreased activity tolerance, Decreased balance, Improper body mechanics, Decreased strength, Difficulty walking, Decreased mobility  Visit Diagnosis: Other abnormalities of gait and mobility  Muscle weakness  (generalized)     Problem List Patient Active Problem List   Diagnosis Date Noted   COVID 12/09/2020   Abdominal pain, epigastric 11/02/2020   Nocturia 03/08/2020   Neck pain, chronic 08/02/2018   Pacemaker 08/02/2018   Shoulder pain, right 07/30/2018   Labile blood pressure 06/04/2018   Sinus node dysfunction ([place]) 03/24/2018   Fatigue 02/01/2018   Mild persistent asthma 12/06/2016   Shortness of breath 07/02/2016   Snoring 02/20/2016   Carpal tunnel syndrome of left wrist 09/26/2015   Insomnia secondary to anxiety 06/19/2015   Sinus bradycardia, chronic 11/14/2014   Hyperpigmented skin lesion 11/14/2014   Vitamin D deficiency 02/15/2014   Anemia 01/25/2012   Depression, major, single episode, severe ([place]) 12/13/2010   Wheezing 10/12/2010   Allergic rhinitis 05/19/2010   SMOKELESS TOBACCO ABUSE 10/26/2009   Hearing loss 12/23/2007   OSTEOPENIA 12/23/2007   Hyperlipidemia LDL goal <100 09/08/2007   Obesity (BMI 30.0-34.9) 09/08/2007   3:26 PM, 04/17/21 Mearl Latin PT, DPT Physical Therapist at [place], [place], [address] Phone: [phone]   Fax:  [phone]  Name: Terry Love MRN: [id number] Date of Birth: 09/19/1935

## 2021-04-17 NOTE — Patient Instructions (Signed)
Access Code: [email or web address] URL: [email or web address] Date: 04/17/2021 Prepared by: Margie Billet  Exercises Seated March - 3 x daily - 7 x weekly - 2 sets - 10 reps Standing Hip Abduction with Counter Support - 3 x daily - 7 x weekly - 2 sets - 10 reps

## 2021-04-18 NOTE — Addendum Note (Signed)
Addended by: Mearl Latin on: 04/18/2021 02:27 PM   Modules accepted: Orders

## 2021-04-19 ENCOUNTER — Encounter (HOSPITAL_COMMUNITY): Payer: Self-pay

## 2021-04-19 ENCOUNTER — Other Ambulatory Visit: Payer: Self-pay

## 2021-04-19 ENCOUNTER — Ambulatory Visit (HOSPITAL_COMMUNITY): Payer: Medicare Other

## 2021-04-19 DIAGNOSIS — R2689 Other abnormalities of gait and mobility: Secondary | ICD-10-CM | POA: Diagnosis not present

## 2021-04-19 DIAGNOSIS — M6281 Muscle weakness (generalized): Secondary | ICD-10-CM | POA: Diagnosis not present

## 2021-04-19 NOTE — Therapy (Signed)
[place], [place], [address] Phone: [phone]   Fax:  [phone]  Physical Therapy Treatment  Patient Details  Name: Terry Love MRN: [id number] Date of Birth: 1936/02/07 Referring Provider (PT): Ronnie Derby MD   Encounter Date: 04/19/2021   PT End of Session - 04/19/21 1353     Visit Number 2    Number of Visits 12    Date for PT Re-Evaluation 05/29/21    Authorization Type Primary Medicare Secondary BCBS supplemental    Progress Note Due on Visit 10    PT Start Time 1351    PT Stop Time 1431    PT Time Calculation (min) 40 min    Activity Tolerance Patient tolerated treatment well    Behavior During Therapy [facility] for tasks assessed/performed             Past Medical History:  Diagnosis Date   Anxiety    Asthma    Bradycardia    Chronic    Colon polyps    Depression    Facial tic    Glaucoma    Hearing loss    Heart disease    High cholesterol    Hyperlipidemia    Insomnia secondary to depression with anxiety 03/2015   Obesity    Seasonal allergies     Past Surgical History:  Procedure Laterality Date   carpal tunnel  release right  2006   COLONOSCOPY     COLONOSCOPY  01/25/2011   Dr. Rehman:Examination performed to cecum Pan colonic diverticulosis/2 small polyps ablated via cold biopsy from transverse colon/External hemorrhoids, benign polyps   COLONOSCOPY N/A 06/29/2013   Procedure: COLONOSCOPY;  Surgeon: Daneil Dolin, MD;  [facility];  Service: Endoscopy;  Laterality: N/A;  10:45   EYE SURGERY     left eye cataracts    PACEMAKER IMPLANT N/A 03/24/2018   Procedure: PACEMAKER IMPLANT;  Surgeon: Evans Lance, MD;  Location: [place] [facility];  Service: Cardiovascular;  Laterality: N/A;    There were no vitals filed for this visit.   Subjective Assessment - 04/19/21 1348     Subjective Pt reports exercises went well, no pain.    Pertinent History  pacemaker    Limitations Standing;House hold activities;Walking    Patient Stated Goals improve gait and balance and improve health                [facility] PT Assessment - 04/19/21 0001       Functional Tests   Functional tests Sit to Stand      Sit to Stand   Comments 5x STS: 22 sec, from chair without UE assist                  [facility] Adult PT Treatment/Exercise - 04/19/21 0001       Exercises   Exercises Knee/Hip      Knee/Hip Exercises: Standing   Heel Raises 10 reps    Heel Raises Limitations // bars, BUE support    Hip Flexion 10 reps    Hip Flexion Limitations 2 sec hold, // bars, BUE support    Hip Abduction Both;10 reps    Abduction Limitations // bars, BUE support    Gait Training retro walking in // bars, no UE support, 4RT    Other Standing Knee Exercises sidestepping in // bars, no UE support, 2RT    Other Standing Knee Exercises standing cone taps at midline  10 reps, crossing midline 10 reps, BUE support // bars      Knee/Hip Exercises: Seated   Long Arc Quad Both;10 reps;2 sets    Marching Both;20 reps                     PT Education - 04/19/21 1353     Education Details Review goals, exercise technique, HEP    Person(s) Educated Patient    Methods Explanation;Demonstration;Handout    Comprehension Verbalized understanding;Returned demonstration              PT Short Term Goals - 04/19/21 1354       PT SHORT TERM GOAL #1   Title Patient will be independent with HEP in order to improve functional outcomes.    Time 3    Period Weeks    Status On-going    Target Date 05/08/21      PT SHORT TERM GOAL #2   Title Patient will report at least 25% improvement in symptoms for improved quality of life.    Time 3    Period Weeks    Status On-going    Target Date 05/08/21               PT Long Term Goals - 04/19/21 1354       PT LONG TERM GOAL #1   Title Patient will report at least 75% improvement in symptoms for  improved quality of life.    Time 6    Period Weeks    Status On-going    Target Date 05/29/21      PT LONG TERM GOAL #2   Title Patient will be able to ambulate at least 300 feet in 2MWT in order to demonstrate improved gait speed for community ambulation.    Time 6    Period Weeks    Status On-going    Target Date 05/29/21      PT LONG TERM GOAL #3   Title Patient will be able to complete 5x STS in under 15 seconds in order to reduce the risk of falls.    Time 6    Period Weeks    Status On-going    Target Date 05/29/21                   Plan - 04/19/21 1429     Clinical Impression Statement Began session with goal reviews, education of therapeutic process and review of HEP. Pt able to perform 5x STS in 22 sec without UE assisting, requiring rocking momentum and occasional BLE bracing against front of chair to assist in powering up to stand. Pt requires bil HHA with standing strengthening exercises, attempted with single UE with fair performance and unable to perform without UE support. Pt with 2 minor LOB with retro walking in // bars without UE support, 1 requiring min A to recover and the other able to recover with UE support on // bars. Continue to progress as able.    Personal Factors and Comorbidities Comorbidity 3+;Fitness;Time since onset of injury/illness/exacerbation    Comorbidities anxiety, depression, cardiac/pacemaker, decreased endurance    Examination-Activity Limitations Squat;Stairs;Stand;Transfers;Lift;Locomotion Level;Hygiene/Grooming;Caring for Others    Examination-Participation Restrictions Lawyer;Shop;Cleaning;Community Activity;Meal Prep    Stability/Clinical Decision Making Stable/Uncomplicated    Rehab Potential Good    PT Frequency 2x / week    PT Duration 6 weeks    PT Treatment/Interventions ADLs/Self Care Home Management;Aquatic Therapy;Electrical Stimulation;Cryotherapy;Moist  Heat;Traction;Ultrasound;Parrafin;Fluidtherapy;Contrast Bath;DME Instruction;Gait training;Stair training;Functional mobility training;Therapeutic  activities;Therapeutic exercise;Balance training;Neuromuscular re-education;Patient/family education;Orthotic Fit/Training;Manual techniques;Compression bandaging;Scar mobilization;Dry needling;Energy conservation;Splinting;Taping;Spinal Manipulations;Joint Manipulations    PT Next Visit Plan gait and balance training, endurance    PT Home Exercise Plan hip abduction, seated march; 1/11: STS, LAQ    Consulted and Agree with Plan of Care Patient             Patient will benefit from skilled therapeutic intervention in order to improve the following deficits and impairments:  Abnormal gait, Decreased endurance, Decreased activity tolerance, Decreased balance, Improper body mechanics, Decreased strength, Difficulty walking, Decreased mobility  Visit Diagnosis: Other abnormalities of gait and mobility  Muscle weakness (generalized)     Problem List Patient Active Problem List   Diagnosis Date Noted   COVID 12/09/2020   Abdominal pain, epigastric 11/02/2020   Nocturia 03/08/2020   Neck pain, chronic 08/02/2018   Pacemaker 08/02/2018   Shoulder pain, right 07/30/2018   Labile blood pressure 06/04/2018   Sinus node dysfunction ([place]) 03/24/2018   Fatigue 02/01/2018   Mild persistent asthma 12/06/2016   Shortness of breath 07/02/2016   Snoring 02/20/2016   Carpal tunnel syndrome of left wrist 09/26/2015   Insomnia secondary to anxiety 06/19/2015   Sinus bradycardia, chronic 11/14/2014   Hyperpigmented skin lesion 11/14/2014   Vitamin D deficiency 02/15/2014   Anemia 01/25/2012   Depression, major, single episode, severe ([place]) 12/13/2010   Wheezing 10/12/2010   Allergic rhinitis 05/19/2010   SMOKELESS TOBACCO ABUSE 10/26/2009   Hearing loss 12/23/2007   OSTEOPENIA 12/23/2007   Hyperlipidemia LDL goal <100 09/08/2007   Obesity (BMI  30.0-34.9) 09/08/2007     Talbot Grumbling PT, DPT 04/19/21, 2:34 PM    [facility] [address] [place], [place], [address] Phone: [phone]   Fax:  [phone]  Name: JATINDER MCDONAGH MRN: [id number] Date of Birth: 03-23-1936

## 2021-04-20 ENCOUNTER — Telehealth: Payer: Medicare Other | Admitting: Family Medicine

## 2021-04-20 ENCOUNTER — Other Ambulatory Visit: Payer: Self-pay

## 2021-04-20 DIAGNOSIS — R5383 Other fatigue: Secondary | ICD-10-CM | POA: Diagnosis not present

## 2021-04-20 DIAGNOSIS — J069 Acute upper respiratory infection, unspecified: Secondary | ICD-10-CM | POA: Diagnosis not present

## 2021-04-20 DIAGNOSIS — R062 Wheezing: Secondary | ICD-10-CM | POA: Diagnosis not present

## 2021-04-20 DIAGNOSIS — R0600 Dyspnea, unspecified: Secondary | ICD-10-CM | POA: Diagnosis not present

## 2021-04-26 ENCOUNTER — Encounter (HOSPITAL_COMMUNITY): Payer: Medicare Other | Admitting: Physical Therapy

## 2021-04-26 ENCOUNTER — Encounter: Payer: Self-pay | Admitting: Family Medicine

## 2021-04-26 ENCOUNTER — Other Ambulatory Visit: Payer: Self-pay

## 2021-04-26 ENCOUNTER — Ambulatory Visit (INDEPENDENT_AMBULATORY_CARE_PROVIDER_SITE_OTHER): Payer: Medicare Other | Admitting: Family Medicine

## 2021-04-26 VITALS — BP 130/78 | HR 60 | Resp 16 | Ht 70.0 in | Wt 216.0 lb

## 2021-04-26 DIAGNOSIS — J302 Other seasonal allergic rhinitis: Secondary | ICD-10-CM | POA: Diagnosis not present

## 2021-04-26 DIAGNOSIS — E669 Obesity, unspecified: Secondary | ICD-10-CM | POA: Diagnosis not present

## 2021-04-26 DIAGNOSIS — F322 Major depressive disorder, single episode, severe without psychotic features: Secondary | ICD-10-CM | POA: Diagnosis not present

## 2021-04-26 DIAGNOSIS — R062 Wheezing: Secondary | ICD-10-CM

## 2021-04-26 DIAGNOSIS — E66811 Obesity, class 1: Secondary | ICD-10-CM

## 2021-04-26 MED ORDER — METHYLPREDNISOLONE ACETATE 80 MG/ML IJ SUSP
40.0000 mg | Freq: Once | INTRAMUSCULAR | Status: AC
  Administered 2021-04-26: 40 mg via INTRAMUSCULAR

## 2021-04-26 MED ORDER — MONTELUKAST SODIUM 10 MG PO TABS: 10.0000 mg | ORAL_TABLET | Freq: Every day | ORAL | 3 refills | Status: DC

## 2021-04-26 NOTE — Patient Instructions (Addendum)
F/U as before, call if you need me sooner  Depo medrol 40 mg Im in office today  Start once daily montelukast for allergies, cough and wheezing, this is at your pharmacy  Take tessalon perles one daily for next 5 days , then as needed  Thanks for choosing [facility], we consider it a privelige to serve you.   You do not need the doxycycline (antibiotic) now, you are 75 % better and no sign of infection

## 2021-04-26 NOTE — Assessment & Plan Note (Addendum)
Daily singulair, and depomedrol 40 mg Im,  And tessalon perle one daily for 5 days

## 2021-04-27 ENCOUNTER — Encounter (HOSPITAL_COMMUNITY): Payer: Medicare Other | Admitting: Physical Therapy

## 2021-04-28 ENCOUNTER — Ambulatory Visit: Payer: Medicare Other | Admitting: Family Medicine

## 2021-04-30 ENCOUNTER — Encounter: Payer: Self-pay | Admitting: Family Medicine

## 2021-04-30 NOTE — Assessment & Plan Note (Signed)
Uncontrolled, commit to daily singulair, depo medrol IM in the office

## 2021-04-30 NOTE — Assessment & Plan Note (Signed)
°  Patient re-educated about  the importance of commitment to a  minimum of 150 minutes of exercise per week as able.  The importance of healthy food choices with portion control discussed, as well as eating regularly and within a 12 hour window most days. The need to choose "clean , green" food 50 to 75% of the time is discussed, as well as to make water the primary drink and set a goal of 64 ounces water daily.    Weight /BMI 04/26/2021 02/23/2021 02/17/2021  WEIGHT 216 lb 218 lb 221 lb 9.6 oz  HEIGHT 5\' 10"  5\' 10"  5\' 10"   BMI 30.99 kg/m2 31.28 kg/m2 31.8 kg/m2

## 2021-04-30 NOTE — Assessment & Plan Note (Signed)
Uncontrolled, [facility] by the  [facility], [facility] and therapy, not suicidal or homicidal

## 2021-04-30 NOTE — Progress Notes (Signed)
° °  Terry Love     MRN: [id number]      DOB: [birth year]   HPI Terry Love is here for follow up of recent UC visit for shortness of breath and wheezing, denies fever , chills or productive cough. Has been on prednisone, symptoms have improved but persist somewhat , still wheezing esp in the morning Doxycycline was prescribed and pt was told to take if needed, he has not taken it, is improved , has no fevr and never had one ROS Denies recent fever or chills.  Denies chest pains, palpitations and leg swelling Denies abdominal pain, nausea, vomiting,diarrhea or constipation.    Denies headaches, seizures, numbness, or tingling. . Denies skin break down or rash.   PE  BP 130/78    Pulse 60    Resp 16    Ht 5\' 10"  (1.778 m)    Wt 216 lb (98 kg)    SpO2 93%    BMI 30.99 kg/m   Patient alert and oriented and in no cardiopulmonary distress.  HEENT: No facial asymmetry, EOMI,     Neck supple .  Chest: decreased though adequate air entry, scattered bulateral high pitched wheezes CVS: S1, S2 no murmurs, no S3.Regular rate.  ABD: Soft non tender.   Ext: No edema  MS: Adequate ROM spine, shoulders, hips and knees.  Skin: Intact, no ulcerations or rash noted.  Psych: Good eye contact, normal affect. Memory intact not anxious or depressed appearing.  CNS: CN 2-12 intact, power,  normal throughout.no focal deficits noted.   Assessment & Plan  Wheezing Daily singulair, and depomedrol 40 mg Im,  And tessalon perle one daily for 5 days   Obesity (BMI 30.0-34.9)  Patient re-educated about  the importance of commitment to a  minimum of 150 minutes of exercise per week as able.  The importance of healthy food choices with portion control discussed, as well as eating regularly and within a 12 hour window most days. The need to choose "clean , green" food 50 to 75% of the time is discussed, as well as to make water the primary drink and set a goal of 64 ounces water daily.    Weight  /BMI 04/26/2021 02/23/2021 02/17/2021  WEIGHT 216 lb 218 lb 221 lb 9.6 oz  HEIGHT 5\' 10"  5\' 10"  5\' 10"   BMI 30.99 kg/m2 31.28 kg/m2 31.8 kg/m2      Depression, major, single episode, severe ([facility]) Uncontrolled, managed by the  [facility], [facility] and therapy, not suicidal or homicidal  Allergic rhinitis Uncontrolled, commit to daily singulair, depo medrol IM in the office

## 2021-05-01 ENCOUNTER — Other Ambulatory Visit: Payer: Self-pay | Admitting: Pulmonary Disease

## 2021-05-01 DIAGNOSIS — J4530 Mild persistent asthma, uncomplicated: Secondary | ICD-10-CM

## 2021-05-03 ENCOUNTER — Ambulatory Visit (HOSPITAL_COMMUNITY): Payer: Medicare Other

## 2021-05-03 ENCOUNTER — Other Ambulatory Visit: Payer: Self-pay

## 2021-05-03 ENCOUNTER — Encounter (HOSPITAL_COMMUNITY): Payer: Self-pay

## 2021-05-03 DIAGNOSIS — R2689 Other abnormalities of gait and mobility: Secondary | ICD-10-CM

## 2021-05-03 DIAGNOSIS — M6281 Muscle weakness (generalized): Secondary | ICD-10-CM

## 2021-05-03 NOTE — Therapy (Signed)
[place], [place], [address] Phone: [phone]   Fax:  [phone]  Physical Therapy Treatment  Patient Details  Name: Terry Love MRN: [id number] Date of Birth: Jun 27, 1935 Referring Provider (PT): Ronnie Derby MD   Encounter Date: 05/03/2021   PT End of Session - 05/03/21 1253     Visit Number 3    Number of Visits 12    Date for PT Re-Evaluation 05/29/21    Authorization Type Primary Medicare Secondary BCBS supplemental    Progress Note Due on Visit 10    PT Start Time 1302    PT Stop Time 1345    PT Time Calculation (min) 43 min    Activity Tolerance Patient tolerated treatment well;Patient limited by fatigue    Behavior During Therapy [facility] for tasks assessed/performed             Past Medical History:  Diagnosis Date   Anxiety    Asthma    Bradycardia    Chronic    Colon polyps    Depression    Facial tic    Glaucoma    Hearing loss    Heart disease    High cholesterol    Hyperlipidemia    Insomnia secondary to depression with anxiety 03/2015   Obesity    Seasonal allergies     Past Surgical History:  Procedure Laterality Date   carpal tunnel  release right  2006   COLONOSCOPY     COLONOSCOPY  01/25/2011   Dr. Rehman:Examination performed to cecum Pan colonic diverticulosis/2 small polyps ablated via cold biopsy from transverse colon/External hemorrhoids, benign polyps   COLONOSCOPY N/A 06/29/2013   Procedure: COLONOSCOPY;  Surgeon: Daneil Dolin, MD;  [facility];  Service: Endoscopy;  Laterality: N/A;  10:45   EYE SURGERY     left eye cataracts    PACEMAKER IMPLANT N/A 03/24/2018   Procedure: PACEMAKER IMPLANT;  Surgeon: Evans Lance, MD;  Location: [facility];  Service: Cardiovascular;  Laterality: N/A;    There were no vitals filed for this visit.   Subjective Assessment - 05/03/21 1304     Subjective Pt reports he feels like it is taking a  long time to get back into shape.    Pertinent History pacemaker    Limitations Standing;House hold activities;Walking    Patient Stated Goals improve gait and balance and improve health                   [facility] Adult PT Treatment/Exercise - 05/03/21 0001       Ambulation/Gait   Ambulation/Gait Yes    Ambulation Distance (Feet) 226 Feet   twice   Gait Comments pt ambulates 1 lap (242ft) at comfortable speed 0.69 m/s, 100 seconds, 4/10 difficulty; pt ambulates 1 lap (213ft) at fast walking speed 0.96 m/s, 72 seconds, 9/10 difficulty      Knee/Hip Exercises: Standing   Heel Raises 10 reps    Heel Raises Limitations // bars, BUE support    Hip Flexion 10 reps    Hip Flexion Limitations 2 sec hold, // bars, LUE support    Hip Abduction Both;10 reps    Abduction Limitations // bars, BUE support    Lateral Step Up Both;10 reps;Hand Hold: 2;Step Height: 4"    Forward Step Up Both;10 reps;Hand Hold: 1;Step Height: 4"    Other Standing Knee Exercises standing cone taps at midline 10 reps, crossing midline  10 reps, LUE support // bars      Knee/Hip Exercises: Seated   Long Arc Quad Both;10 reps;2 sets    [employer] Weight 2 lbs.    Marching Both;2 sets;10 reps    Marching Weights 2 lbs.    Sit to Sand 10 reps;with UE support                     PT Education - 05/03/21 1352     Education Details Continue HEP, discussed walking program at home (time based or lap based)    Person(s) Educated Patient    Methods Explanation    Comprehension Verbalized understanding              PT Short Term Goals - 04/19/21 1354       PT SHORT TERM GOAL #1   Title Patient will be independent with HEP in order to improve functional outcomes.    Time 3    Period Weeks    Status On-going    Target Date 05/08/21      PT SHORT TERM GOAL #2   Title Patient will report at least 25% improvement in symptoms for improved quality of life.    Time 3    Period Weeks    Status  On-going    Target Date 05/08/21               PT Long Term Goals - 04/19/21 1354       PT LONG TERM GOAL #1   Title Patient will report at least 75% improvement in symptoms for improved quality of life.    Time 6    Period Weeks    Status On-going    Target Date 05/29/21      PT LONG TERM GOAL #2   Title Patient will be able to ambulate at least 300 feet in 2MWT in order to demonstrate improved gait speed for community ambulation.    Time 6    Period Weeks    Status On-going    Target Date 05/29/21      PT LONG TERM GOAL #3   Title Patient will be able to complete 5x STS in under 15 seconds in order to reduce the risk of falls.    Time 6    Period Weeks    Status On-going    Target Date 05/29/21                   Plan - 05/03/21 1253     Clinical Impression Statement Pt tolerates BLE strengthening exercises, requiring single or bil UE support with standing exercises for balance support. Pt challenged with walking test, ambulating at 0.61m/s at comfortable speed reporting 4/10 difficulty and 0.95 m/s at fast speed reporting 9/10 difficulty and requiring increased time to recover at end of lap. Pt HR in 70s and SpO2 95-98% throughout treatment. Pt requires therapeutic rest breaks throughout session due to deconditioning, educated on pursed lip breathing and slow, deep breaths as recovering. Educated pt on HEP and walking program at home and increasing as able. Continue to progress as able.    Personal Factors and Comorbidities Comorbidity 3+;Fitness;Time since onset of injury/illness/exacerbation    Comorbidities anxiety, depression, cardiac/pacemaker, decreased endurance    Examination-Activity Limitations Squat;Stairs;Stand;Transfers;Lift;Locomotion Level;Hygiene/Grooming;Caring for Others    Examination-Participation Restrictions Lawyer;Shop;Cleaning;Community Activity;Meal Prep    Stability/Clinical Decision Making Stable/Uncomplicated    Rehab  Potential Good    PT Frequency 2x / week  PT Duration 6 weeks    PT Treatment/Interventions ADLs/Self Care Home Management;Aquatic Therapy;Electrical Stimulation;Cryotherapy;Moist Heat;Traction;Ultrasound;Parrafin;Fluidtherapy;Contrast Bath;DME Instruction;Gait training;Stair training;Functional mobility training;Therapeutic activities;Therapeutic exercise;Balance training;Neuromuscular re-education;Patient/family education;Orthotic Fit/Training;Manual techniques;Compression bandaging;Scar mobilization;Dry needling;Energy conservation;Splinting;Taping;Spinal Manipulations;Joint Manipulations    PT Next Visit Plan progress gait and balance training, progress endurance    PT Home Exercise Plan hip abduction, seated march; 1/11: STS, LAQ    Consulted and Agree with Plan of Care Patient             Patient will benefit from skilled therapeutic intervention in order to improve the following deficits and impairments:  Abnormal gait, Decreased endurance, Decreased activity tolerance, Decreased balance, Improper body mechanics, Decreased strength, Difficulty walking, Decreased mobility  Visit Diagnosis: Other abnormalities of gait and mobility  Muscle weakness (generalized)     Problem List Patient Active Problem List   Diagnosis Date Noted   COVID 12/09/2020   Nocturia 03/08/2020   Neck pain, chronic 08/02/2018   Pacemaker 08/02/2018   Shoulder pain, right 07/30/2018   Labile blood pressure 06/04/2018   Sinus node dysfunction ([place]) 03/24/2018   Fatigue 02/01/2018   Mild persistent asthma 12/06/2016   Shortness of breath 07/02/2016   Snoring 02/20/2016   Carpal tunnel syndrome of left wrist 09/26/2015   Insomnia secondary to anxiety 06/19/2015   Sinus bradycardia, chronic 11/14/2014   Hyperpigmented skin lesion 11/14/2014   Vitamin D deficiency 02/15/2014   Anemia 01/25/2012   Depression, major, single episode, severe ([place] [address]) 12/13/2010   Wheezing 10/12/2010   Allergic rhinitis  05/19/2010   SMOKELESS TOBACCO ABUSE 10/26/2009   Hearing loss 12/23/2007   OSTEOPENIA 12/23/2007   Hyperlipidemia LDL goal <100 09/08/2007   Obesity (BMI 30.0-34.9) 09/08/2007     Talbot Grumbling PT, DPT 05/03/21, 2:01 PM    [place], [place], [address] Phone: [phone]   Fax:  [phone]  Name: Terry Love MRN: [id number] Date of Birth: April 01, 1936

## 2021-05-05 ENCOUNTER — Other Ambulatory Visit: Payer: Self-pay

## 2021-05-05 ENCOUNTER — Ambulatory Visit (HOSPITAL_COMMUNITY): Payer: Medicare Other | Admitting: Physical Therapy

## 2021-05-05 VITALS — HR 94

## 2021-05-05 DIAGNOSIS — M6281 Muscle weakness (generalized): Secondary | ICD-10-CM | POA: Diagnosis not present

## 2021-05-05 DIAGNOSIS — R2689 Other abnormalities of gait and mobility: Secondary | ICD-10-CM

## 2021-05-05 NOTE — Therapy (Signed)
[place], [place], [address] Phone: [phone]   Fax:  [phone]  Physical Therapy Treatment  Patient Details  Name: Terry Love MRN: [id number] Date of Birth: 06/02/1935 Referring Provider (PT): Ronnie Derby MD   Encounter Date: 05/05/2021   PT End of Session - 05/05/21 1142     Visit Number 4    Number of Visits 12    Date for PT Re-Evaluation 05/29/21    Authorization Type Primary Medicare Secondary BCBS supplemental    Progress Note Due on Visit 10    PT Start Time 1116    PT Stop Time 1156    PT Time Calculation (min) 40 min    Activity Tolerance Patient tolerated treatment well;Patient limited by fatigue    Behavior During Therapy [facility] for tasks assessed/performed             Past Medical History:  Diagnosis Date   Anxiety    Asthma    Bradycardia    Chronic    Colon polyps    Depression    Facial tic    Glaucoma    Hearing loss    Heart disease    High cholesterol    Hyperlipidemia    Insomnia secondary to depression with anxiety 03/2015   Obesity    Seasonal allergies     Past Surgical History:  Procedure Laterality Date   carpal tunnel  release right  2006   COLONOSCOPY     COLONOSCOPY  01/25/2011   Dr. Rehman:Examination performed to cecum Pan colonic diverticulosis/2 small polyps ablated via cold biopsy from transverse colon/External hemorrhoids, benign polyps   COLONOSCOPY N/A 06/29/2013   Procedure: COLONOSCOPY;  Surgeon: Daneil Dolin, MD;  [facility];  Service: Endoscopy;  Laterality: N/A;  10:45   EYE SURGERY     left eye cataracts    PACEMAKER IMPLANT N/A 03/24/2018   Procedure: PACEMAKER IMPLANT;  Surgeon: Evans Lance, MD;  Location: [facility];  Service: Cardiovascular;  Laterality: N/A;    Vitals:   05/05/21 1125 05/05/21 1158  Pulse: 70 94  SpO2: 96% 95%     Subjective Assessment - 05/05/21 1135     Subjective Patient  states that he is still having a tough time breathing after activities at home.    Pertinent History pacemaker    Limitations Standing;House hold activities;Walking    Patient Stated Goals improve gait and balance and improve health    Currently in Pain? No/denies                               [facility] Adult PT Treatment/Exercise - 05/05/21 0001       Knee/Hip Exercises: Aerobic   Nustep x38min seat lvl 12 (60-70spm)    Other Aerobic Cable walking #6 1x6 backward, #5 1x6 forward, #6 1x6 side/side (5lbs AWs for all)                       PT Short Term Goals - 04/19/21 1354       PT SHORT TERM GOAL #1   Title Patient will be independent with HEP in order to improve functional outcomes.    Time 3    Period Weeks    Status On-going    Target Date 05/08/21      PT SHORT TERM GOAL #2   Title Patient will  report at least 25% improvement in symptoms for improved quality of life.    Time 3    Period Weeks    Status On-going    Target Date 05/08/21               PT Long Term Goals - 04/19/21 1354       PT LONG TERM GOAL #1   Title Patient will report at least 75% improvement in symptoms for improved quality of life.    Time 6    Period Weeks    Status On-going    Target Date 05/29/21      PT LONG TERM GOAL #2   Title Patient will be able to ambulate at least 300 feet in 2MWT in order to demonstrate improved gait speed for community ambulation.    Time 6    Period Weeks    Status On-going    Target Date 05/29/21      PT LONG TERM GOAL #3   Title Patient will be able to complete 5x STS in under 15 seconds in order to reduce the risk of falls.    Time 6    Period Weeks    Status On-going    Target Date 05/29/21                   Plan - 05/05/21 1217     Clinical Impression Statement Patient tolerated treatment well during today's session although seated rests were needed in order to keep O2 sat levels above 95%. Only two  light corrections were needed during the whole session in regard to balance and this appeared to be due to muscle fatigue.    Personal Factors and Comorbidities Comorbidity 3+;Fitness;Time since onset of injury/illness/exacerbation    Comorbidities anxiety, depression, cardiac/pacemaker, decreased endurance    Examination-Activity Limitations Squat;Stairs;Stand;Transfers;Lift;Locomotion Level;Hygiene/Grooming;Caring for Others    Examination-Participation Restrictions Lawyer;Shop;Cleaning;Community Activity;Meal Prep    Stability/Clinical Decision Making Stable/Uncomplicated    Rehab Potential Good    PT Frequency 2x / week    PT Duration 6 weeks    PT Treatment/Interventions ADLs/Self Care Home Management;Aquatic Therapy;Electrical Stimulation;Cryotherapy;Moist Heat;Traction;Ultrasound;Parrafin;Fluidtherapy;Contrast Bath;DME Instruction;Gait training;Stair training;Functional mobility training;Therapeutic activities;Therapeutic exercise;Balance training;Neuromuscular re-education;Patient/family education;Orthotic Fit/Training;Manual techniques;Compression bandaging;Scar mobilization;Dry needling;Energy conservation;Splinting;Taping;Spinal Manipulations;Joint Manipulations    PT Next Visit Plan progress gait and balance training, progress endurance    PT Home Exercise Plan hip abduction, seated march; 1/11: STS, LAQ    Consulted and Agree with Plan of Care Patient             Patient will benefit from skilled therapeutic intervention in order to improve the following deficits and impairments:  Abnormal gait, Decreased endurance, Decreased activity tolerance, Decreased balance, Improper body mechanics, Decreased strength, Difficulty walking, Decreased mobility  Visit Diagnosis: Other abnormalities of gait and mobility  Muscle weakness (generalized)     Problem List Patient Active Problem List   Diagnosis Date Noted   COVID 12/09/2020   Nocturia 03/08/2020   Neck pain,  chronic 08/02/2018   Pacemaker 08/02/2018   Shoulder pain, right 07/30/2018   Labile blood pressure 06/04/2018   Sinus node dysfunction ([place]) 03/24/2018   Fatigue 02/01/2018   Mild persistent asthma 12/06/2016   Shortness of breath 07/02/2016   Snoring 02/20/2016   Carpal tunnel syndrome of left wrist 09/26/2015   Insomnia secondary to anxiety 06/19/2015   Sinus bradycardia, chronic 11/14/2014   Hyperpigmented skin lesion 11/14/2014   Vitamin D deficiency 02/15/2014   Anemia 01/25/2012   Depression, major,  single episode, severe ([place]) 12/13/2010   Wheezing 10/12/2010   Allergic rhinitis 05/19/2010   SMOKELESS TOBACCO ABUSE 10/26/2009   Hearing loss 12/23/2007   OSTEOPENIA 12/23/2007   Hyperlipidemia LDL goal <100 09/08/2007   Obesity (BMI 30.0-34.9) 09/08/2007    Adalberto Cole, PT 05/05/2021, 12:22 PM  [place], [place], [address] Phone: [phone]   Fax:  [phone]  Name: Terry Love MRN: [id number] Date of Birth: 04-13-1935

## 2021-05-09 ENCOUNTER — Other Ambulatory Visit: Payer: Self-pay

## 2021-05-09 ENCOUNTER — Encounter: Payer: Self-pay | Admitting: Internal Medicine

## 2021-05-09 ENCOUNTER — Ambulatory Visit (INDEPENDENT_AMBULATORY_CARE_PROVIDER_SITE_OTHER): Payer: Medicare Other | Admitting: Internal Medicine

## 2021-05-09 DIAGNOSIS — R0609 Other forms of dyspnea: Secondary | ICD-10-CM

## 2021-05-09 MED ORDER — FAMOTIDINE 20 MG PO TABS: ORAL_TABLET | ORAL | 11 refills | Status: DC

## 2021-05-09 NOTE — Patient Instructions (Addendum)
Continue omeprazole 40 mg Take 30-60 min before first meal of the day   Add pepcid 20 mg after supper   Work on inhaler technique:  relax and gently blow all the way out then take a nice smooth full deep breath back in, triggering the inhaler at same time you start breathing in.  Hold for up to 5 seconds if you can. Blow symbicort out thru nose. Rinse and gargle with water when done.  If mouth or throat bother you at all,  try brushing teeth/gums/tongue with arm and hammer toothpaste/ make a slurry and gargle and spit out.   Ok to try albuterol 15 min before an activity (on alternating days)  that you know would usually make you short of breath and see if it makes any difference and if makes none then don't take albuterol after activity unless you can't catch your breath as this means it's the resting that helps, not the albuterol.       GERD (REFLUX)  is an extremely common cause of respiratory symptoms just like yours , many times with no obvious heartburn at all.    It can be treated with medication, but also with lifestyle changes including elevation of the head of your bed (ideally with 6-8inch blocks under the headboard of your bed),  Smoking cessation, avoidance of late meals, excessive alcohol, and avoid fatty foods, chocolate, peppermint, colas, red wine, and acidic juices such as orange juice.  NO MINT OR MENTHOL PRODUCTS SO NO COUGH DROPS  USE SUGARLESS CANDY INSTEAD (Jolley ranchers or Stover's or Life Savers) or even ice chips will also do - the key is to swallow to prevent all throat clearing. NO OIL BASED VITAMINS - use powdered substitutes.  Avoid fish oil when coughing.    Please schedule a follow up office visit in 6 weeks, call sooner if needed with all medications /inhalers/ solutions in hand so we can verify exactly what you are taking. This includes all medications from all doctors and over the counters

## 2021-05-09 NOTE — Progress Notes (Signed)
Terry Love, male    DOB: 01/25/1936, 86 y.o.   MRN: [id number]   Brief patient profile:  71  yobm  never smoker/retired in cig factory referred to pulmonary clinic in [place]  05/09/2021 by Dr Vaughan Browner for asthma f/u - onset was  2012   W/u as of 05/09/2021 1st    PFTs 09/05/16 FVC 2.90 (84%), FEV1 2.32 (92%), F/F 80, TLC 82%, DLCO 79% Minimal reduction in diffusion capacity.  FENO 09/05/16- 54  Labs: CBC with diff 09/05/16- WBC 8.2, 5.4% eos, absolute eso count 460 Blood allergy profile 09/05/16- negative RAST, IgE 150  Cardiac: Echo 03/05/2018 LVH, EF 60 to 68%, grade 1 diastolic dysfunction      History of Present Illness  05/09/2021  Pulmonary/ 1st office eval/ Carrieann Spielberg / [place] [facility] re asthma maint on symbicort 160 with poor techique  Chief Complaint  Patient presents with   New Patient (Initial Visit)    Has seen Dr. Vaughan Browner in the past is switching to [place] office for location.   Concerns about breathing   Using symbicort 2 puffs in am and 2 puffs in pm. Wants to discuss different inhaler costs.   Dyspnea:  walking about 50 ft and stop x sev years  Cough: not a problem  Sleep: bed is flat/ on side s am flare but sometimes wheeze per wife  SABA use:  several times a week but never prechallenges   No obvious day to day or daytime variability or assoc excess/ purulent sputum or mucus plugs or hemoptysis or cp or chest tightness, subjective wheeze or overt sinus or hb symptoms.    Also denies any obvious fluctuation of symptoms with weather or environmental changes or other aggravating or alleviating factors except as outlined above   No unusual exposure hx or h/o childhood pna/ asthma or knowledge of premature birth.  Current Allergies, Complete Past Medical History, Past Surgical History, Family History, and Social History were reviewed in [employer] record.  ROS  The following are not active complaints unless  bolded Hoarseness, sore throat, dysphagia, dental problems, itching, sneezing,  nasal congestion or discharge of excess mucus or purulent secretions, ear ache,   fever, chills, sweats, unintended wt loss or wt gain, classically pleuritic or exertional cp,  orthopnea pnd or arm/hand swelling  or leg swelling, presyncope, palpitations, abdominal pain, anorexia, nausea, vomiting, diarrhea  or change in bowel habits or change in bladder habits, change in stools or change in urine, dysuria, hematuria,  rash, arthralgias, visual complaints, headache, numbness, weakness or ataxia or problems with walking or coordination,  change in mood or  memory. 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  atorvastatin (LIPITOR) 40 MG tablet Take 1 tablet (40 mg total) by mouth at bedtime. 90 tablet 3   azelastine (ASTELIN) 0.1 % nasal spray Place 2 sprays into both nostrils 2 (two) times daily. Use in each nostril as directed 30 mL 12   benzonatate (TESSALON) 100 MG capsule Take 1 capsule (100 mg total) by mouth 2 (two) times daily as needed for cough. 20 capsule 4   budesonide-formoterol (SYMBICORT) 160-4.5 MCG/ACT inhaler Inhale 2 puffs into the lungs in the morning and at bedtime. 10.2 g 3   calcium-vitamin D (OSCAL WITH D) 500-200 MG-UNIT tablet Take 1 tablet by mouth 2 (two) times daily. 150 tablet 1   carboxymethylcellulose (REFRESH PLUS)  0.5 % SOLN INSTILL 1 DROP IN EACH EYE FOUR TIMES A DAY     dorzolamide (TRUSOPT) 2 % ophthalmic solution 1 drop 2 (two) times daily.     dorzolamide-timolol (COSOPT) 22.3-6.8 MG/ML ophthalmic solution INSTILL 1 DROP IN [facility] EYE TWO TIMES A DAY     furosemide (LASIX) 40 MG tablet Take 1 tablet (40 mg total) by mouth daily. 90 tablet 3   latanoprost (XALATAN) 0.005 % ophthalmic solution Place 1 drop into both eyes at bedtime.     montelukast (SINGULAIR) 10 MG tablet Take 1 tablet (10 mg total) by mouth at bedtime. 30 tablet 3   Multiple Vitamin (MULTIVITAMIN WITH MINERALS) TABS tablet Take 1 tablet by mouth at bedtime.     omeprazole (PRILOSEC) 40 MG capsule TAKE 1 CAPSULE (40 MG TOTAL) BY MOUTH DAILY. 90 capsule 1   sertraline (ZOLOFT) 25 MG tablet Take 25 mg by mouth daily.     tamsulosin (FLOMAX) 0.4 MG CAPS capsule Take 1 capsule (0.4 mg total) by mouth daily. 90 capsule 3   venlafaxine XR (EFFEXOR-XR) 150 MG 24 hr capsule Take 1 capsule by mouth every morning.     vitamin C (ASCORBIC ACID) 500 MG tablet Take 500 mg by mouth at bedtime.      Vitamin D, Ergocalciferol, (DRISDOL) 1.25 MG (50000 UNIT) CAPS capsule TAKE 1 CAPSULE (50,000 UNITS TOTAL) BY MOUTH EVERY MONDAY. IN THE MORNING 12 capsule 1   Wheat Dextrin (BENEFIBER DRINK MIX PO) Take 1 Dose by mouth daily as needed (for regularity). Use as directed daily     No facility-administered medications prior to visit.     Objective:     BP 130/88 (BP Location: Left Arm, Patient Position: Sitting)    Pulse 86    Temp 98 F (36.7 C) (Temporal)    Ht 5\' 10"  (1.778 m)    Wt 220 lb (99.8 kg)    SpO2 97% Comment: ra   BMI 31.57 kg/m   SpO2: 97 % (ra)  Somber wm nad / very hoh, sits somewhat slumped over    HEENT : pt wearing mask not removed for exam due to covid -19 concerns.    NECK :  without JVD/Nodes/TM/ nl carotid upstrokes bilaterally   LUNGS: no acc muscle use,  Nl contour chest with distant but clear BS bilaterally without  cough on insp or exp maneuvers   CV:  RRR  no s3 or murmur or increase in P2, and no edema   ABD:  obese soft and nontender with nl inspiratory excursion in the supine position. No bruits or organomegaly appreciated, bowel sounds nl  MS:  Nl gait/ ext warm without deformities, calf tenderness, cyanosis or clubbing No obvious joint restrictions   SKIN: warm and dry without lesions  NEURO:  alert, approp, nl sensorium with  no motor or cerebellar deficits apparent.  Pos resting tremor     I personally reviewed images and agree with radiology impression as follows:  CXR:   PA and LAT   02/23/21  No active cardiopulmonary disease. My review:  mild t kyphosis, small lung vol with lots of intestinal gas under L HD      Assessment   DOE (dyspnea on exertion) Onset ? Around 2012 at wt = 246  -  PFTs 09/05/16 FVC 2.90 (84%), FEV1 2.32 (92%), F/F 80, TLC 82%, DLCO 79% Minimal reduction in diffusion capacity. - 05/09/2021   Walked on Ra  x  one  lap(s) =  approx 150  ft  @ slow pace, stopped due to fatigue and sob with lowest 02 sats 97%  . 05/09/2021  After extensive coaching inhaler device,  effectiveness =    75% from baseline < 25%   Symptoms are markedly disproportionate to objective findings and not clear to what extent this is actually a pulmonary  problem but pt does appear to have difficult to sort out respiratory symptoms of unknown origin for which  DDX  = almost all start with A and  include Adherence, Ace Inhibitors, Acid Reflux, Active Sinus Disease, Alpha 1 Antitripsin deficiency, Anxiety masquerading as Airways dz,  ABPA,  Allergy(esp in young), Aspiration (esp in elderly), Adverse effects of meds,  Active smoking or Vaping, A bunch of PE's/clot burden (a few small clots can't cause this syndrome unless there is already severe underlying pulm or vascular dz with poor reserve),  Anemia or thyroid disorder, plus two Bs  = Bronchiectasis and Beta blocker use..and one C= CHF (note he  does have G 1 diastolic dysfunction )    He clearly does not have copd and I'm not even sure there is any asthma here but rather mostly deconditioning and geriatric declline based on my initial eval.   Not convinced there's much airflow obst limiting him  at present or for that matter any of he above cardiopulmonary concerns  but suggested he try saba pre-exertion to see if any change and in meantime rx max for gerd and f/u in 6 weeks with all meds in hand using a trust but verify approach to confirm accurate Medication  Reconciliation The principal here is that until we are certain that the  patients are doing what we've asked, it makes no sense to ask them to do more.   Each maintenance medication was reviewed in detail including emphasizing most importantly the difference between maintenance and prns and under what circumstances the prns are to be triggered using an action plan format where appropriate.  Total time for H and P, chart review, counseling, reviewing hfa device(s) , directly observing portions of ambulatory 02 saturation study/ and generating customized AVS unique to this office visit / same day charting > 40 min for pt new to me                  Christinia Gully, MD 05/09/2021

## 2021-05-10 ENCOUNTER — Ambulatory Visit (HOSPITAL_COMMUNITY): Payer: Medicare Other | Attending: Family Medicine | Admitting: Physical Therapy

## 2021-05-10 ENCOUNTER — Encounter: Payer: Self-pay | Admitting: Internal Medicine

## 2021-05-10 ENCOUNTER — Encounter (HOSPITAL_COMMUNITY): Payer: Self-pay | Admitting: Physical Therapy

## 2021-05-10 DIAGNOSIS — R2689 Other abnormalities of gait and mobility: Secondary | ICD-10-CM | POA: Diagnosis not present

## 2021-05-10 DIAGNOSIS — M6281 Muscle weakness (generalized): Secondary | ICD-10-CM | POA: Insufficient documentation

## 2021-05-10 NOTE — Patient Instructions (Signed)
Access Code: [email or web address] URL: [email or web address] Date: 05/10/2021 Prepared by: [facility] Yordin Rhoda  Exercises Seated Shoulder Flexion Full Range - 2 x daily - 7 x weekly - 3 sets - 10 reps

## 2021-05-10 NOTE — Assessment & Plan Note (Addendum)
Onset ? Around 2012 at wt = 246  -  PFTs 09/05/16 FVC 2.90 (84%), FEV1 2.32 (92%), F/F 80, TLC 82%, DLCO 79% Minimal reduction in diffusion capacity. - 05/09/2021   Walked on Ra  x  one  lap(s) =  approx 150  ft  @ slow pace, stopped due to fatigue and sob with lowest 02 sats 97%  . 05/09/2021  After extensive coaching inhaler device,  effectiveness =    75% from baseline < 25%   Symptoms are markedly disproportionate to objective findings and not clear to what extent this is actually a pulmonary  problem but pt does appear to have difficult to sort out respiratory symptoms of unknown origin for which  DDX  = almost all start with A and  include Adherence, Ace Inhibitors, Acid Reflux, Active Sinus Disease, Alpha 1 Antitripsin deficiency, Anxiety masquerading as Airways dz,  ABPA,  Allergy(esp in young), Aspiration (esp in elderly), Adverse effects of meds,  Active smoking or Vaping, A bunch of PE's/clot burden (a few small clots can't cause this syndrome unless there is already severe underlying pulm or vascular dz with poor reserve),  Anemia or thyroid disorder, plus two Bs  = Bronchiectasis and Beta blocker use..and one C= CHF (note he does have G 1 diastolic dysfunction )    He clearly does not have copd and I'm not even sure there is any asthma here but rather mostly deconditioning and geriatric declline based on my initial eval.   Not convinced there's much airflow obst limiting him  at present or for that matter any of he above cardiopulmonary concerns  but suggested he try saba pre-exertion to see if any change and in meantime rx max for gerd and f/u in 6 weeks with all meds in hand using a trust but verify approach to confirm accurate Medication  Reconciliation The principal here is that until we are certain that the  patients are doing what we've asked, it makes no sense to ask them to do more.   Each maintenance medication was reviewed in detail including emphasizing most importantly the  difference between maintenance and prns and under what circumstances the prns are to be triggered using an action plan format where appropriate.  Total time for H and P, chart review, counseling, reviewing hfa device(s) , directly observing portions of ambulatory 02 saturation study/ and generating customized AVS unique to this office visit / same day charting > 40 min for pt new to me

## 2021-05-10 NOTE — Therapy (Signed)
[place], [place], [address] Phone: [phone]   Fax:  [phone]  Physical Therapy Treatment  Patient Details  Name: Terry Love MRN: [id number] Date of Birth: 19-Apr-1935 Referring Provider (PT): Ronnie [place] MD   Encounter Date: 05/10/2021   PT End of Session - 05/10/21 1310     Visit Number 5    Number of Visits 12    Date for PT Re-Evaluation 05/29/21    Authorization Type Primary Medicare Secondary BCBS supplemental    Progress Note Due on Visit 10    PT Start Time 1312    PT Stop Time 1350    PT Time Calculation (min) 38 min    Activity Tolerance Patient tolerated treatment well;Patient limited by fatigue    Behavior During Therapy [facility] for tasks assessed/performed             Past Medical History:  Diagnosis Date   Anxiety    Asthma    Bradycardia    Chronic    Colon polyps    Depression    Facial tic    Glaucoma    Hearing loss    Heart disease    High cholesterol    Hyperlipidemia    Insomnia secondary to depression with anxiety 03/2015   Obesity    Seasonal allergies     Past Surgical History:  Procedure Laterality Date   carpal tunnel  release right  2006   COLONOSCOPY     COLONOSCOPY  01/25/2011   Dr. Rehman:Examination performed to cecum Pan colonic diverticulosis/2 small polyps ablated via cold biopsy from transverse colon/External hemorrhoids, benign polyps   COLONOSCOPY N/A 06/29/2013   Procedure: COLONOSCOPY;  Surgeon: Daneil Dolin, MD;  Location[facility];  Service: Endoscopy;  Laterality: N/A;  10:45   EYE SURGERY     left eye cataracts    PACEMAKER IMPLANT N/A 03/24/2018   Procedure: PACEMAKER IMPLANT;  Surgeon: Evans Lance, MD;  Location: [place] [facility];  Service: Cardiovascular;  Laterality: N/A;    There were no vitals filed for this visit.   Subjective Assessment - 05/10/21 1312     Subjective Still gets SOB and needs breaks for a bit  and then goes at it again    Pertinent History pacemaker    Limitations Standing;House hold activities;Walking    Patient Stated Goals improve gait and balance and improve health    Currently in Pain? No/denies                               [facility] Adult PT Treatment/Exercise - 05/10/21 0001       Knee/Hip Exercises: Aerobic   Nustep 5 min seat lv 2 seat 11 60-70 SPM    Other Aerobic Cable walking #6 1x6 backward, #5 1x6 forward (5lbs AWs for all)      Knee/Hip Exercises: Seated   Other Seated Knee/Hip Exercises upright posture with shoulder flexion 2x 10    Sit to Sand 10 reps;with UE support                     PT Education - 05/10/21 1310     Education Details HEP    Person(s) Educated Patient    Methods Explanation;Demonstration    Comprehension Verbalized understanding;Returned demonstration              PT Short Term Goals -  04/19/21 1354       PT SHORT TERM GOAL #1   Title Patient will be independent with HEP in order to improve functional outcomes.    Time 3    Period Weeks    Status On-going    Target Date 05/08/21      PT SHORT TERM GOAL #2   Title Patient will report at least 25% improvement in symptoms for improved quality of life.    Time 3    Period Weeks    Status On-going    Target Date 05/08/21               PT Long Term Goals - 04/19/21 1354       PT LONG TERM GOAL #1   Title Patient will report at least 75% improvement in symptoms for improved quality of life.    Time 6    Period Weeks    Status On-going    Target Date 05/29/21      PT LONG TERM GOAL #2   Title Patient will be able to ambulate at least 300 feet in 2MWT in order to demonstrate improved gait speed for community ambulation.    Time 6    Period Weeks    Status On-going    Target Date 05/29/21      PT LONG TERM GOAL #3   Title Patient will be able to complete 5x STS in under 15 seconds in order to reduce the risk of falls.    Time  6    Period Weeks    Status On-going    Target Date 05/29/21                   Plan - 05/10/21 1310     Clinical Impression Statement Patient begins session on nu step for dynamic warm up and endurance training. Continued with strengthening, balance, and endurance exercises.  He requires CGA with weighted exercises for balance/safety without loss of balance although is unsteady. Demonstrating improving strength with transfers/STS requiring decreased UE support to complete. He states moderate fatigue at end of session. Patient will continue to benefit from skilled physical therapy in order to reduce impairment and improve function.    Personal Factors and Comorbidities Comorbidity 3+;Fitness;Time since onset of injury/illness/exacerbation    Comorbidities anxiety, depression, cardiac/pacemaker, decreased endurance    Examination-Activity Limitations Squat;Stairs;Stand;Transfers;Lift;Locomotion Level;Hygiene/Grooming;Caring for Others    Examination-Participation Restrictions Lawyer;Shop;Cleaning;Community Activity;Meal Prep    Stability/Clinical Decision Making Stable/Uncomplicated    Rehab Potential Good    PT Frequency 2x / week    PT Duration 6 weeks    PT Treatment/Interventions ADLs/Self Care Home Management;Aquatic Therapy;Electrical Stimulation;Cryotherapy;Moist Heat;Traction;Ultrasound;Parrafin;Fluidtherapy;Contrast Bath;DME Instruction;Gait training;Stair training;Functional mobility training;Therapeutic activities;Therapeutic exercise;Balance training;Neuromuscular re-education;Patient/family education;Orthotic Fit/Training;Manual techniques;Compression bandaging;Scar mobilization;Dry needling;Energy conservation;Splinting;Taping;Spinal Manipulations;Joint Manipulations    PT Next Visit Plan progress gait and balance training, progress endurance    PT Home Exercise Plan hip abduction, seated march; 1/11: STS, LAQ 2/1 good posture with shoulder flexion in seated     Consulted and Agree with Plan of Care Patient             Patient will benefit from skilled therapeutic intervention in order to improve the following deficits and impairments:  Abnormal gait, Decreased endurance, Decreased activity tolerance, Decreased balance, Improper body mechanics, Decreased strength, Difficulty walking, Decreased mobility  Visit Diagnosis: Other abnormalities of gait and mobility  Muscle weakness (generalized)     Problem List Patient Active Problem List   Diagnosis Date  Noted   COVID 12/09/2020   Nocturia 03/08/2020   Neck pain, chronic 08/02/2018   Pacemaker 08/02/2018   Shoulder pain, right 07/30/2018   Labile blood pressure 06/04/2018   Sinus node dysfunction ([place]) 03/24/2018   Fatigue 02/01/2018   Mild persistent asthma 12/06/2016   DOE (dyspnea on exertion) 07/02/2016   Snoring 02/20/2016   Carpal tunnel syndrome of left wrist 09/26/2015   Insomnia secondary to anxiety 06/19/2015   Sinus bradycardia, chronic 11/14/2014   Hyperpigmented skin lesion 11/14/2014   Vitamin D deficiency 02/15/2014   Anemia 01/25/2012   Depression, major, single episode, severe ([place]) 12/13/2010   Wheezing 10/12/2010   Allergic rhinitis 05/19/2010   SMOKELESS TOBACCO ABUSE 10/26/2009   Hearing loss 12/23/2007   OSTEOPENIA 12/23/2007   Hyperlipidemia LDL goal <100 09/08/2007   Obesity (BMI 30.0-34.9) 09/08/2007    1:52 PM, 05/10/21 Mearl Latin PT, DPT Physical Therapist at [place], [place], [address] Phone: [phone]   Fax:  [phone]  Name: Terry Love MRN: [id number] Date of Birth: Apr 02, 1936

## 2021-05-11 ENCOUNTER — Other Ambulatory Visit: Payer: Self-pay

## 2021-05-11 ENCOUNTER — Ambulatory Visit (HOSPITAL_COMMUNITY): Payer: Medicare Other | Admitting: Physical Therapy

## 2021-05-11 ENCOUNTER — Encounter (HOSPITAL_COMMUNITY): Payer: Self-pay | Admitting: Physical Therapy

## 2021-05-11 DIAGNOSIS — M6281 Muscle weakness (generalized): Secondary | ICD-10-CM

## 2021-05-11 DIAGNOSIS — R2689 Other abnormalities of gait and mobility: Secondary | ICD-10-CM

## 2021-05-11 NOTE — Therapy (Signed)
[place], [place], [address] Phone: [phone]   Fax:  [phone]  Physical Therapy Treatment  Patient Details  Name: Terry Love MRN: [id number] Date of Birth: Dec 03, 1935 Referring Provider (PT): Ronnie Derby MD   Encounter Date: 05/11/2021   PT End of Session - 05/11/21 1307     Visit Number 6    Number of Visits 12    Date for PT Re-Evaluation 05/29/21    Authorization Type Primary Medicare Secondary BCBS supplemental    Progress Note Due on Visit 10    PT Start Time 1312    PT Stop Time 1350    PT Time Calculation (min) 38 min    Activity Tolerance Patient tolerated treatment well;Patient limited by fatigue    Behavior During Therapy [facility] for tasks assessed/performed             Past Medical History:  Diagnosis Date   Anxiety    Asthma    Bradycardia    Chronic    Colon polyps    Depression    Facial tic    Glaucoma    Hearing loss    Heart disease    High cholesterol    Hyperlipidemia    Insomnia secondary to depression with anxiety 03/2015   Obesity    Seasonal allergies     Past Surgical History:  Procedure Laterality Date   carpal tunnel  release right  2006   COLONOSCOPY     COLONOSCOPY  01/25/2011   Dr. Rehman:Examination performed to cecum Pan colonic diverticulosis/2 small polyps ablated via cold biopsy from transverse colon/External hemorrhoids, benign polyps   COLONOSCOPY N/A 06/29/2013   Procedure: COLONOSCOPY;  Surgeon: Daneil Dolin, MD;  [facility];  Service: Endoscopy;  Laterality: N/A;  10:45   EYE SURGERY     left eye cataracts    PACEMAKER IMPLANT N/A 03/24/2018   Procedure: PACEMAKER IMPLANT;  Surgeon: Evans Lance, MD;  Location: [place] [facility];  Service: Cardiovascular;  Laterality: N/A;    There were no vitals filed for this visit.   Subjective Assessment - 05/11/21 1308     Subjective states he isnt sore today and feels good.  Still has the SOB.    Pertinent History pacemaker    Limitations Standing;House hold activities;Walking    Patient Stated Goals improve gait and balance and improve health    Currently in Pain? No/denies                               [facility] Adult PT Treatment/Exercise - 05/11/21 0001       Knee/Hip Exercises: Aerobic   Nustep 5 min seat lv 2 seat 10 60-70 SPM      Knee/Hip Exercises: Standing   Lateral Step Up Both;2 sets;10 reps;Hand Hold: 2;Step Height: 6"    Forward Step Up Both;2 sets;10 reps    Other Standing Knee Exercises row green band 3x 10      Knee/Hip Exercises: Seated   Other Seated Knee/Hip Exercises upright posture with shoulder flexion 2x 10    Other Seated Knee/Hip Exercises trunk flexion stretch with green ball 10 x 5 second holds    Sit to Sand 10 reps;with UE support                     PT Education - 05/11/21 1307  Education Details HEP    Person(s) Educated Patient    Methods Explanation;Demonstration    Comprehension Verbalized understanding;Returned demonstration              PT Short Term Goals - 04/19/21 1354       PT SHORT TERM GOAL #1   Title Patient will be independent with HEP in order to improve functional outcomes.    Time 3    Period Weeks    Status On-going    Target Date 05/08/21      PT SHORT TERM GOAL #2   Title Patient will report at least 25% improvement in symptoms for improved quality of life.    Time 3    Period Weeks    Status On-going    Target Date 05/08/21               PT Long Term Goals - 04/19/21 1354       PT LONG TERM GOAL #1   Title Patient will report at least 75% improvement in symptoms for improved quality of life.    Time 6    Period Weeks    Status On-going    Target Date 05/29/21      PT LONG TERM GOAL #2   Title Patient will be able to ambulate at least 300 feet in 2MWT in order to demonstrate improved gait speed for community ambulation.    Time 6     Period Weeks    Status On-going    Target Date 05/29/21      PT LONG TERM GOAL #3   Title Patient will be able to complete 5x STS in under 15 seconds in order to reduce the risk of falls.    Time 6    Period Weeks    Status On-going    Target Date 05/29/21                   Plan - 05/11/21 1307     Clinical Impression Statement Began session on nu step for warm up. Continued with stair exercises for LE strength and endurance training. Began rows for postural strengthening and patient requires intermittent cueing for posture with fair carry over. Frequent rest breaks for fatigue. Patient will continue to benefit from skilled physical therapy in order to reduce impairment and improve function.    Personal Factors and Comorbidities Comorbidity 3+;Fitness;Time since onset of injury/illness/exacerbation    Comorbidities anxiety, depression, cardiac/pacemaker, decreased endurance    Examination-Activity Limitations Squat;Stairs;Stand;Transfers;Lift;Locomotion Level;Hygiene/Grooming;Caring for Others    Examination-Participation Restrictions Lawyer;Shop;Cleaning;Community Activity;Meal Prep    Stability/Clinical Decision Making Stable/Uncomplicated    Rehab Potential Good    PT Frequency 2x / week    PT Duration 6 weeks    PT Treatment/Interventions ADLs/Self Care Home Management;Aquatic Therapy;Electrical Stimulation;Cryotherapy;Moist Heat;Traction;Ultrasound;Parrafin;Fluidtherapy;Contrast Bath;DME Instruction;Gait training;Stair training;Functional mobility training;Therapeutic activities;Therapeutic exercise;Balance training;Neuromuscular re-education;Patient/family education;Orthotic Fit/Training;Manual techniques;Compression bandaging;Scar mobilization;Dry needling;Energy conservation;Splinting;Taping;Spinal Manipulations;Joint Manipulations    PT Next Visit Plan progress gait and balance training, progress endurance    PT Home Exercise Plan hip abduction, seated march;  1/11: STS, LAQ 2/1 good posture with shoulder flexion in seated    Consulted and Agree with Plan of Care Patient             Patient will benefit from skilled therapeutic intervention in order to improve the following deficits and impairments:  Abnormal gait, Decreased endurance, Decreased activity tolerance, Decreased balance, Improper body mechanics, Decreased strength, Difficulty walking, Decreased mobility  Visit Diagnosis: Other  abnormalities of gait and mobility  Muscle weakness (generalized)     Problem List Patient Active Problem List   Diagnosis Date Noted   COVID 12/09/2020   Nocturia 03/08/2020   Neck pain, chronic 08/02/2018   Pacemaker 08/02/2018   Shoulder pain, right 07/30/2018   Labile blood pressure 06/04/2018   Sinus node dysfunction ([place]) 03/24/2018   Fatigue 02/01/2018   Mild persistent asthma 12/06/2016   DOE (dyspnea on exertion) 07/02/2016   Snoring 02/20/2016   Carpal tunnel syndrome of left wrist 09/26/2015   Insomnia secondary to anxiety 06/19/2015   Sinus bradycardia, chronic 11/14/2014   Hyperpigmented skin lesion 11/14/2014   Vitamin D deficiency 02/15/2014   Anemia 01/25/2012   Depression, major, single episode, severe ([place]) 12/13/2010   Wheezing 10/12/2010   Allergic rhinitis 05/19/2010   SMOKELESS TOBACCO ABUSE 10/26/2009   Hearing loss 12/23/2007   OSTEOPENIA 12/23/2007   Hyperlipidemia LDL goal <100 09/08/2007   Obesity (BMI 30.0-34.9) 09/08/2007    1:47 PM, 05/11/21 Mearl Latin PT, DPT Physical Therapist at [place], [place], [address] Phone: [phone]   Fax:  [phone]  Name: Terry Love MRN: [id number] Date of Birth: 17-Apr-1935

## 2021-05-15 ENCOUNTER — Other Ambulatory Visit: Payer: Self-pay

## 2021-05-15 ENCOUNTER — Encounter (HOSPITAL_COMMUNITY): Payer: Self-pay | Admitting: Physical Therapy

## 2021-05-15 ENCOUNTER — Ambulatory Visit (HOSPITAL_COMMUNITY): Payer: Medicare Other | Admitting: Physical Therapy

## 2021-05-15 DIAGNOSIS — R2689 Other abnormalities of gait and mobility: Secondary | ICD-10-CM

## 2021-05-15 DIAGNOSIS — M6281 Muscle weakness (generalized): Secondary | ICD-10-CM | POA: Diagnosis not present

## 2021-05-15 NOTE — Therapy (Signed)
[place], [place], [address] Phone: [phone]   Fax:  [phone]  Physical Therapy Treatment  Patient Details  Name: Terry Love MRN: [id number] Date of Birth: 1936/02/06 Referring Provider (PT): Ronnie Derby MD   Encounter Date: 05/15/2021   PT End of Session - 05/15/21 1406     Visit Number 7    Number of Visits 12    Date for PT Re-Evaluation 05/29/21    Authorization Type Primary Medicare Secondary BCBS supplemental    Progress Note Due on Visit 10    PT Start Time 1407    PT Stop Time 1445    PT Time Calculation (min) 38 min    Activity Tolerance Patient tolerated treatment well;Patient limited by fatigue    Behavior During Therapy [facility] for tasks assessed/performed             Past Medical History:  Diagnosis Date   Anxiety    Asthma    Bradycardia    Chronic    Colon polyps    Depression    Facial tic    Glaucoma    Hearing loss    Heart disease    High cholesterol    Hyperlipidemia    Insomnia secondary to depression with anxiety 03/2015   Obesity    Seasonal allergies     Past Surgical History:  Procedure Laterality Date   carpal tunnel  release right  2006   COLONOSCOPY     COLONOSCOPY  01/25/2011   Dr. Rehman:Examination performed to cecum Pan colonic diverticulosis/2 small polyps ablated via cold biopsy from transverse colon/External hemorrhoids, benign polyps   COLONOSCOPY N/A 06/29/2013   Procedure: COLONOSCOPY;  Surgeon: Daneil Dolin, MD;  [facility];  Service: Endoscopy;  Laterality: N/A;  10:45   EYE SURGERY     left eye cataracts    PACEMAKER IMPLANT N/A 03/24/2018   Procedure: PACEMAKER IMPLANT;  Surgeon: Evans Lance, MD;  Location: [place] [facility];  Service: Cardiovascular;  Laterality: N/A;    There were no vitals filed for this visit.   Subjective Assessment - 05/15/21 1407     Subjective Was tired after last session and felt  better with rest.    Pertinent History pacemaker    Limitations Standing;House hold activities;Walking    Patient Stated Goals improve gait and balance and improve health    Currently in Pain? No/denies                               [facility] Adult PT Treatment/Exercise - 05/15/21 0001       Knee/Hip Exercises: Aerobic   Nustep 5 min seat lv 3 seat 10 60-70 SPM      Knee/Hip Exercises: Standing   Lateral Step Up Both;2 sets;10 reps;Hand Hold: 2;Step Height: 6"    Forward Step Up Both;10 reps;3 sets;Hand Hold: 1;Hand Hold: 2;Step Height: 6"      Knee/Hip Exercises: Seated   Sit to Sand 10 reps;with UE support;2 sets;without UE support                     PT Education - 05/15/21 1407     Education Details HEP    Person(s) Educated Patient    Methods Explanation;Demonstration    Comprehension Verbalized understanding;Returned demonstration              PT Short Term  Goals - 04/19/21 1354       PT SHORT TERM GOAL #1   Title Patient will be independent with HEP in order to improve functional outcomes.    Time 3    Period Weeks    Status On-going    Target Date 05/08/21      PT SHORT TERM GOAL #2   Title Patient will report at least 25% improvement in symptoms for improved quality of life.    Time 3    Period Weeks    Status On-going    Target Date 05/08/21               PT Long Term Goals - 04/19/21 1354       PT LONG TERM GOAL #1   Title Patient will report at least 75% improvement in symptoms for improved quality of life.    Time 6    Period Weeks    Status On-going    Target Date 05/29/21      PT LONG TERM GOAL #2   Title Patient will be able to ambulate at least 300 feet in 2MWT in order to demonstrate improved gait speed for community ambulation.    Time 6    Period Weeks    Status On-going    Target Date 05/29/21      PT LONG TERM GOAL #3   Title Patient will be able to complete 5x STS in under 15 seconds in  order to reduce the risk of falls.    Time 6    Period Weeks    Status On-going    Target Date 05/29/21                   Plan - 05/15/21 1407     Clinical Impression Statement Patient tolerating additional resistance on nustep today. Continued with LE strengthening. Patient requires frequent cueing for posture and sequencing with stair exercises with good/fair carry over. He demonstrates improving activity tolerance but continues to require intermittent rest breaks. Patient will continue to benefit from skilled physical therapy in order to improve function and reduce impairment.    Personal Factors and Comorbidities Comorbidity 3+;Fitness;Time since onset of injury/illness/exacerbation    Comorbidities anxiety, depression, cardiac/pacemaker, decreased endurance    Examination-Activity Limitations Squat;Stairs;Stand;Transfers;Lift;Locomotion Level;Hygiene/Grooming;Caring for Others    Examination-Participation Restrictions Lawyer;Shop;Cleaning;Community Activity;Meal Prep    Stability/Clinical Decision Making Stable/Uncomplicated    Rehab Potential Good    PT Frequency 2x / week    PT Duration 6 weeks    PT Treatment/Interventions ADLs/Self Care Home Management;Aquatic Therapy;Electrical Stimulation;Cryotherapy;Moist Heat;Traction;Ultrasound;Parrafin;Fluidtherapy;Contrast Bath;DME Instruction;Gait training;Stair training;Functional mobility training;Therapeutic activities;Therapeutic exercise;Balance training;Neuromuscular re-education;Patient/family education;Orthotic Fit/Training;Manual techniques;Compression bandaging;Scar mobilization;Dry needling;Energy conservation;Splinting;Taping;Spinal Manipulations;Joint Manipulations    PT Next Visit Plan progress gait and balance training, progress endurance    PT Home Exercise Plan hip abduction, seated march; 1/11: STS, LAQ 2/1 good posture with shoulder flexion in seated    Consulted and Agree with Plan of Care Patient              Patient will benefit from skilled therapeutic intervention in order to improve the following deficits and impairments:  Abnormal gait, Decreased endurance, Decreased activity tolerance, Decreased balance, Improper body mechanics, Decreased strength, Difficulty walking, Decreased mobility  Visit Diagnosis: Other abnormalities of gait and mobility  Muscle weakness (generalized)     Problem List Patient Active Problem List   Diagnosis Date Noted   COVID 12/09/2020   Nocturia 03/08/2020   Neck pain, chronic 08/02/2018  Pacemaker 08/02/2018   Shoulder pain, right 07/30/2018   Labile blood pressure 06/04/2018   Sinus node dysfunction ([place]) 03/24/2018   Fatigue 02/01/2018   Mild persistent asthma 12/06/2016   DOE (dyspnea on exertion) 07/02/2016   Snoring 02/20/2016   Carpal tunnel syndrome of left wrist 09/26/2015   Insomnia secondary to anxiety 06/19/2015   Sinus bradycardia, chronic 11/14/2014   Hyperpigmented skin lesion 11/14/2014   Vitamin D deficiency 02/15/2014   Anemia 01/25/2012   Depression, major, single episode, severe ([place]) 12/13/2010   Wheezing 10/12/2010   Allergic rhinitis 05/19/2010   SMOKELESS TOBACCO ABUSE 10/26/2009   Hearing loss 12/23/2007   OSTEOPENIA 12/23/2007   Hyperlipidemia LDL goal <100 09/08/2007   Obesity (BMI 30.0-34.9) 09/08/2007    2:44 PM, 05/15/21 Mearl Latin PT, DPT Physical Therapist at [place], [place], [address] Phone: [phone]   Fax:  [phone]  Name: Terry Love MRN: [id number] Date of Birth: 06-05-1935

## 2021-05-17 ENCOUNTER — Other Ambulatory Visit: Payer: Self-pay

## 2021-05-17 ENCOUNTER — Ambulatory Visit (HOSPITAL_COMMUNITY): Payer: Medicare Other | Admitting: Physical Therapy

## 2021-05-17 ENCOUNTER — Encounter (HOSPITAL_COMMUNITY): Payer: Self-pay | Admitting: Physical Therapy

## 2021-05-17 DIAGNOSIS — R2689 Other abnormalities of gait and mobility: Secondary | ICD-10-CM | POA: Diagnosis not present

## 2021-05-17 DIAGNOSIS — M6281 Muscle weakness (generalized): Secondary | ICD-10-CM | POA: Diagnosis not present

## 2021-05-17 NOTE — Therapy (Signed)
[place], [place], [address] Phone: [phone]   Fax:  [phone]  Physical Therapy Treatment  Patient Details  Name: Terry Love MRN: [id number] Date of Birth: Jan 31, 1936 Referring Provider (PT): Ronnie Derby MD   Encounter Date: 05/17/2021   PT End of Session - 05/17/21 1315     Visit Number 8    Number of Visits 12    Date for PT Re-Evaluation 05/29/21    Authorization Type Primary Medicare Secondary BCBS supplemental    Progress Note Due on Visit 10    PT Start Time 1315    PT Stop Time 1355    PT Time Calculation (min) 40 min    Activity Tolerance Patient tolerated treatment well;Patient limited by fatigue    Behavior During Therapy [facility] for tasks assessed/performed             Past Medical History:  Diagnosis Date   Anxiety    Asthma    Bradycardia    Chronic    Colon polyps    Depression    Facial tic    Glaucoma    Hearing loss    Heart disease    High cholesterol    Hyperlipidemia    Insomnia secondary to depression with anxiety 03/2015   Obesity    Seasonal allergies     Past Surgical History:  Procedure Laterality Date   carpal tunnel  release right  2006   COLONOSCOPY     COLONOSCOPY  01/25/2011   Dr. Rehman:Examination performed to cecum Pan colonic diverticulosis/2 small polyps ablated via cold biopsy from transverse colon/External hemorrhoids, benign polyps   COLONOSCOPY N/A 06/29/2013   Procedure: COLONOSCOPY;  Surgeon: Daneil Dolin, MD;  [facility];  Service: Endoscopy;  Laterality: N/A;  10:45   EYE SURGERY     left eye cataracts    PACEMAKER IMPLANT N/A 03/24/2018   Procedure: PACEMAKER IMPLANT;  Surgeon: Evans Lance, MD;  Location: [place] [facility];  Service: Cardiovascular;  Laterality: N/A;    There were no vitals filed for this visit.   Subjective Assessment - 05/17/21 1315     Subjective Been feeling good. Has been doing HEP.     Pertinent History pacemaker    Limitations Standing;House hold activities;Walking    Patient Stated Goals improve gait and balance and improve health    Currently in Pain? No/denies                               [facility] Adult PT Treatment/Exercise - 05/17/21 0001       Knee/Hip Exercises: Aerobic   Recumbent Bike 5 minutes level 3 with cueing for >6.4 MPH      Knee/Hip Exercises: Standing   Lateral Step Up Both;10 reps;Step Height: 6";3 sets;Hand Hold: 1    Other Standing Knee Exercises row  3x 10, extension 3x 10 green band, palof 1x 20 bilateral green band                     PT Education - 05/17/21 1315     Education Details HEP    Person(s) Educated Patient    Methods Explanation;Demonstration    Comprehension Verbalized understanding;Returned demonstration              PT Short Term Goals - 04/19/21 1354       PT SHORT TERM GOAL #1  Title Patient will be independent with HEP in order to improve functional outcomes.    Time 3    Period Weeks    Status On-going    Target Date 05/08/21      PT SHORT TERM GOAL #2   Title Patient will report at least 25% improvement in symptoms for improved quality of life.    Time 3    Period Weeks    Status On-going    Target Date 05/08/21               PT Long Term Goals - 04/19/21 1354       PT LONG TERM GOAL #1   Title Patient will report at least 75% improvement in symptoms for improved quality of life.    Time 6    Period Weeks    Status On-going    Target Date 05/29/21      PT LONG TERM GOAL #2   Title Patient will be able to ambulate at least 300 feet in 2MWT in order to demonstrate improved gait speed for community ambulation.    Time 6    Period Weeks    Status On-going    Target Date 05/29/21      PT LONG TERM GOAL #3   Title Patient will be able to complete 5x STS in under 15 seconds in order to reduce the risk of falls.    Time 6    Period Weeks    Status On-going     Target Date 05/29/21                   Plan - 05/17/21 1315     Clinical Impression Statement Began session with bike for dynamic warm up with cueing for >6.4 mph with good carry over. Continued with UE resisted exercises with increased reps for endurance and postural strengthening. Patient with intermittent unsteadiness with standing exercises requiring frequent HHA. Patient will continue to benefit from skilled physical therapy in order to reduce impairment and improve function.    Personal Factors and Comorbidities Comorbidity 3+;Fitness;Time since onset of injury/illness/exacerbation    Comorbidities anxiety, depression, cardiac/pacemaker, decreased endurance    Examination-Activity Limitations Squat;Stairs;Stand;Transfers;Lift;Locomotion Level;Hygiene/Grooming;Caring for Others    Examination-Participation Restrictions Lawyer;Shop;Cleaning;Community Activity;Meal Prep    Stability/Clinical Decision Making Stable/Uncomplicated    Rehab Potential Good    PT Frequency 2x / week    PT Duration 6 weeks    PT Treatment/Interventions ADLs/Self Care Home Management;Aquatic Therapy;Electrical Stimulation;Cryotherapy;Moist Heat;Traction;Ultrasound;Parrafin;Fluidtherapy;Contrast Bath;DME Instruction;Gait training;Stair training;Functional mobility training;Therapeutic activities;Therapeutic exercise;Balance training;Neuromuscular re-education;Patient/family education;Orthotic Fit/Training;Manual techniques;Compression bandaging;Scar mobilization;Dry needling;Energy conservation;Splinting;Taping;Spinal Manipulations;Joint Manipulations    PT Next Visit Plan progress gait and balance training, progress endurance    PT Home Exercise Plan hip abduction, seated march; 1/11: STS, LAQ 2/1 good posture with shoulder flexion in seated    Consulted and Agree with Plan of Care Patient             Patient will benefit from skilled therapeutic intervention in order to improve the  following deficits and impairments:  Abnormal gait, Decreased endurance, Decreased activity tolerance, Decreased balance, Improper body mechanics, Decreased strength, Difficulty walking, Decreased mobility  Visit Diagnosis: Other abnormalities of gait and mobility  Muscle weakness (generalized)     Problem List Patient Active Problem List   Diagnosis Date Noted   COVID 12/09/2020   Nocturia 03/08/2020   Neck pain, chronic 08/02/2018   Pacemaker 08/02/2018   Shoulder pain, right 07/30/2018   Labile blood pressure  06/04/2018   Sinus node dysfunction ([place]) 03/24/2018   Fatigue 02/01/2018   Mild persistent asthma 12/06/2016   DOE (dyspnea on exertion) 07/02/2016   Snoring 02/20/2016   Carpal tunnel syndrome of left wrist 09/26/2015   Insomnia secondary to anxiety 06/19/2015   Sinus bradycardia, chronic 11/14/2014   Hyperpigmented skin lesion 11/14/2014   Vitamin D deficiency 02/15/2014   Anemia 01/25/2012   Depression, major, single episode, severe ([place]) 12/13/2010   Wheezing 10/12/2010   Allergic rhinitis 05/19/2010   SMOKELESS TOBACCO ABUSE 10/26/2009   Hearing loss 12/23/2007   OSTEOPENIA 12/23/2007   Hyperlipidemia LDL goal <100 09/08/2007   Obesity (BMI 30.0-34.9) 09/08/2007    1:56 PM, 05/17/21 Mearl Latin PT, DPT Physical Therapist at [place], [place], [address] Phone: [phone]   Fax:  [phone]  Name: Terry Love MRN: [id number] Date of Birth: 13-Aug-1935

## 2021-05-22 ENCOUNTER — Other Ambulatory Visit: Payer: Self-pay

## 2021-05-22 ENCOUNTER — Ambulatory Visit (HOSPITAL_COMMUNITY): Payer: Medicare Other | Admitting: Physical Therapy

## 2021-05-22 ENCOUNTER — Encounter (HOSPITAL_COMMUNITY): Payer: Self-pay | Admitting: Physical Therapy

## 2021-05-22 VITALS — BP 123/80

## 2021-05-22 DIAGNOSIS — R2689 Other abnormalities of gait and mobility: Secondary | ICD-10-CM

## 2021-05-22 DIAGNOSIS — M6281 Muscle weakness (generalized): Secondary | ICD-10-CM | POA: Diagnosis not present

## 2021-05-22 NOTE — Therapy (Signed)
[place], [place], [address] Phone: [phone]   Fax:  [phone]  Physical Therapy Treatment  Patient Details  Name: Terry Love MRN: [id number] Date of Birth: 08-24-1935 Referring Provider (PT): Ronnie Derby MD   Encounter Date: 05/22/2021   PT End of Session - 05/22/21 1316     Visit Number 9    Number of Visits 12    Date for PT Re-Evaluation 05/29/21    Authorization Type Primary Medicare Secondary BCBS supplemental    Progress Note Due on Visit 10    PT Start Time 1316    PT Stop Time 1355    PT Time Calculation (min) 39 min    Activity Tolerance Patient tolerated treatment well;Patient limited by fatigue    Behavior During Therapy [facility] for tasks assessed/performed             Past Medical History:  Diagnosis Date   Anxiety    Asthma    Bradycardia    Chronic    Colon polyps    Depression    Facial tic    Glaucoma    Hearing loss    Heart disease    High cholesterol    Hyperlipidemia    Insomnia secondary to depression with anxiety 03/2015   Obesity    Seasonal allergies     Past Surgical History:  Procedure Laterality Date   carpal tunnel  release right  2006   COLONOSCOPY     COLONOSCOPY  01/25/2011   Dr. Rehman:Examination performed to cecum Pan colonic diverticulosis/2 small polyps ablated via cold biopsy from transverse colon/External hemorrhoids, benign polyps   COLONOSCOPY N/A 06/29/2013   Procedure: COLONOSCOPY;  Surgeon: Daneil Dolin, MD;  [facility];  Service: Endoscopy;  Laterality: N/A;  10:45   EYE SURGERY     left eye cataracts    PACEMAKER IMPLANT N/A 03/24/2018   Procedure: PACEMAKER IMPLANT;  Surgeon: Evans Lance, MD;  Location: [facility];  Service: Cardiovascular;  Laterality: N/A;    Vitals:   05/22/21 1347  BP: 123/80     Subjective Assessment - 05/22/21 1316     Subjective Has been doing exercises. Has to take a  couple steps before balance improves after getting up due to R hip.    Pertinent History pacemaker    Limitations Standing;House hold activities;Walking    Patient Stated Goals improve gait and balance and improve health    Currently in Pain? No/denies                               [facility] Adult PT Treatment/Exercise - 05/22/21 0001       Knee/Hip Exercises: Aerobic   Recumbent Bike 5 minutes level 3 with cueing for >6.4 MPH      Knee/Hip Exercises: Standing   Hip Flexion Both;10 reps;3 sets    Hip Flexion Limitations alternating step tap 8 inch without UE support      Knee/Hip Exercises: Seated   Sit to Sand 10 reps;2 sets;without UE support                     PT Education - 05/22/21 1316     Education Details HEP    Person(s) Educated Patient    Methods Explanation;Demonstration    Comprehension Verbalized understanding;Returned demonstration  PT Short Term Goals - 04/19/21 1354       PT SHORT TERM GOAL #1   Title Patient will be independent with HEP in order to improve functional outcomes.    Time 3    Period Weeks    Status On-going    Target Date 05/08/21      PT SHORT TERM GOAL #2   Title Patient will report at least 25% improvement in symptoms for improved quality of life.    Time 3    Period Weeks    Status On-going    Target Date 05/08/21               PT Long Term Goals - 04/19/21 1354       PT LONG TERM GOAL #1   Title Patient will report at least 75% improvement in symptoms for improved quality of life.    Time 6    Period Weeks    Status On-going    Target Date 05/29/21      PT LONG TERM GOAL #2   Title Patient will be able to ambulate at least 300 feet in 2MWT in order to demonstrate improved gait speed for community ambulation.    Time 6    Period Weeks    Status On-going    Target Date 05/29/21      PT LONG TERM GOAL #3   Title Patient will be able to complete 5x STS in under 15  seconds in order to reduce the risk of falls.    Time 6    Period Weeks    Status On-going    Target Date 05/29/21                   Plan - 05/22/21 1316     Clinical Impression Statement Patient performs bike with increased resistance and reduced SOB throughout session but increased fatigue following (8/10). Patient given CGA with marching secondary to impaired SLS/dynamic balance. Demonstrating improving functional strength with STS. PN next session. He requires several seated rest breaks for fatigue. Patient will continue to benefit from physical therapy to reduce impairment and improve function.    Personal Factors and Comorbidities Comorbidity 3+;Fitness;Time since onset of injury/illness/exacerbation    Comorbidities anxiety, depression, cardiac/pacemaker, decreased endurance    Examination-Activity Limitations Squat;Stairs;Stand;Transfers;Lift;Locomotion Level;Hygiene/Grooming;Caring for Others    Examination-Participation Restrictions Lawyer;Shop;Cleaning;Community Activity;Meal Prep    Stability/Clinical Decision Making Stable/Uncomplicated    Rehab Potential Good    PT Frequency 2x / week    PT Duration 6 weeks    PT Treatment/Interventions ADLs/Self Care Home Management;Aquatic Therapy;Electrical Stimulation;Cryotherapy;Moist Heat;Traction;Ultrasound;Parrafin;Fluidtherapy;Contrast Bath;DME Instruction;Gait training;Stair training;Functional mobility training;Therapeutic activities;Therapeutic exercise;Balance training;Neuromuscular re-education;Patient/family education;Orthotic Fit/Training;Manual techniques;Compression bandaging;Scar mobilization;Dry needling;Energy conservation;Splinting;Taping;Spinal Manipulations;Joint Manipulations    PT Next Visit Plan progress gait and balance training, progress endurance    PT Home Exercise Plan hip abduction, seated march; 1/11: STS, LAQ 2/1 good posture with shoulder flexion in seated    Consulted and Agree with Plan  of Care Patient             Patient will benefit from skilled therapeutic intervention in order to improve the following deficits and impairments:  Abnormal gait, Decreased endurance, Decreased activity tolerance, Decreased balance, Improper body mechanics, Decreased strength, Difficulty walking, Decreased mobility  Visit Diagnosis: Other abnormalities of gait and mobility  Muscle weakness (generalized)     Problem List Patient Active Problem List   Diagnosis Date Noted   COVID 12/09/2020   Nocturia 03/08/2020  Neck pain, chronic 08/02/2018   Pacemaker 08/02/2018   Shoulder pain, right 07/30/2018   Labile blood pressure 06/04/2018   Sinus node dysfunction ([place]) 03/24/2018   Fatigue 02/01/2018   Mild persistent asthma 12/06/2016   DOE (dyspnea on exertion) 07/02/2016   Snoring 02/20/2016   Carpal tunnel syndrome of left wrist 09/26/2015   Insomnia secondary to anxiety 06/19/2015   Sinus bradycardia, chronic 11/14/2014   Hyperpigmented skin lesion 11/14/2014   Vitamin D deficiency 02/15/2014   Anemia 01/25/2012   Depression, major, single episode, severe ([place]) 12/13/2010   Wheezing 10/12/2010   Allergic rhinitis 05/19/2010   SMOKELESS TOBACCO ABUSE 10/26/2009   Hearing loss 12/23/2007   OSTEOPENIA 12/23/2007   Hyperlipidemia LDL goal <100 09/08/2007   Obesity (BMI 30.0-34.9) 09/08/2007    1:57 PM, 05/22/21 Mearl Latin PT, DPT Physical Therapist at [place], [place], [address] Phone: [phone]   Fax:  [phone]  Name: JADIAN KARMAN MRN: [id number] Date of Birth: Feb 23, 1936

## 2021-05-24 ENCOUNTER — Ambulatory Visit (HOSPITAL_COMMUNITY): Payer: Medicare Other | Admitting: Physical Therapy

## 2021-05-24 ENCOUNTER — Other Ambulatory Visit: Payer: Self-pay

## 2021-05-24 ENCOUNTER — Encounter (HOSPITAL_COMMUNITY): Payer: Self-pay | Admitting: Physical Therapy

## 2021-05-24 DIAGNOSIS — R2689 Other abnormalities of gait and mobility: Secondary | ICD-10-CM

## 2021-05-24 DIAGNOSIS — M6281 Muscle weakness (generalized): Secondary | ICD-10-CM | POA: Diagnosis not present

## 2021-05-24 NOTE — Therapy (Signed)
[place], [place], [address] Phone: [phone]   Fax:  [phone]  Physical Therapy Treatment/progress Note  Patient Details  Name: Terry Love MRN: [id number] Date of Birth: March 14, 1936 Referring Provider (PT): Ronnie Derby MD   Encounter Date: 05/24/2021  Progress Note   Reporting Period 04/17/21 to 05/24/21   See note below for Objective Data and Assessment of Progress/Goals    PT End of Session - 05/24/21 1319     Visit Number 10    Number of Visits 12    Date for PT Re-Evaluation 05/29/21    Authorization Type Primary Medicare Secondary BCBS supplemental    Progress Note Due on Visit 20    PT Start Time 1317    PT Stop Time 1355    PT Time Calculation (min) 38 min    Activity Tolerance Patient tolerated treatment well;Patient limited by fatigue    Behavior During Therapy [facility] for tasks assessed/performed             Past Medical History:  Diagnosis Date   Anxiety    Asthma    Bradycardia    Chronic    Colon polyps    Depression    Facial tic    Glaucoma    Hearing loss    Heart disease    High cholesterol    Hyperlipidemia    Insomnia secondary to depression with anxiety 03/2015   Obesity    Seasonal allergies     Past Surgical History:  Procedure Laterality Date   carpal tunnel  release right  2006   COLONOSCOPY     COLONOSCOPY  01/25/2011   Dr. Rehman:Examination performed to cecum Pan colonic diverticulosis/2 small polyps ablated via cold biopsy from transverse colon/External hemorrhoids, benign polyps   COLONOSCOPY N/A 06/29/2013   Procedure: COLONOSCOPY;  Surgeon: Daneil Dolin, MD;  [facility];  Service: Endoscopy;  Laterality: N/A;  10:45   EYE SURGERY     left eye cataracts    PACEMAKER IMPLANT N/A 03/24/2018   Procedure: PACEMAKER IMPLANT;  Surgeon: Evans Lance, MD;  Location: [facility];  Service: Cardiovascular;  Laterality: N/A;     There were no vitals filed for this visit.   Subjective Assessment - 05/24/21 1317     Subjective States he has been feeling his balance is not that good once and awhile. Reports "I was in bad shape when I started, not 100% but better at 72%".    Pertinent History pacemaker    Limitations Standing;House hold activities;Walking    Patient Stated Goals improve gait and balance and improve health    Currently in Pain? No/denies                [facility] PT Assessment - 05/24/21 0001       Assessment   Medical Diagnosis Ambulatory Dysfunction    Referring Provider (PT) Ronnie Derby MD    Prior Therapy Wrists      Precautions   Precautions Fall      Restrictions   Weight Bearing Restrictions No      Prior Function   Level of Independence Independent    Vocation Retired      Associate Professor   Overall Cognitive Status Within Functional Limits for tasks assessed      Observation/Other Assessments   Observations Ambulates without AD, unsteady    Focus on Therapeutic Outcomes (FOTO)  n/a      Functional  Tests   Functional tests Sit to Stand      Sit to Stand   Comments 5x STS: 23 sec, from chair without UE assist      Strength   Strength Assessment Site Hip;Knee;Ankle    Right/Left Hip Right;Left    Right Hip Flexion 5/5    Left Hip Flexion 5/5    Right/Left Knee Right;Left    Right Knee Flexion 5/5    Right Knee Extension 5/5    Left Knee Flexion 5/5    Left Knee Extension 5/5    Right/Left Ankle Right;Left    Right Ankle Dorsiflexion 5/5    Left Ankle Dorsiflexion 4+/5      Transfers   Comments labored      Ambulation/Gait   Ambulation/Gait Yes    Assistive device None    Gait Pattern Trunk flexed;Wide base of support;Poor foot clearance - left;Poor foot clearance - right    Gait Comments 2MWT, 241ft no breaks                           [facility] Adult PT Treatment/Exercise - 05/24/21 0001       Ambulation/Gait   Ambulation Distance  (Feet) 260 Feet   260     Knee/Hip Exercises: Aerobic   Nustep 5 min seat lv 4 seat 10 60-70 SPM      Knee/Hip Exercises: Standing   Other Standing Knee Exercises sidestepping in // bars, no UE support, 6RT                     PT Education - 05/24/21 1318     Education Details HEP, POC  progress    Person(s) Educated Patient    Methods Explanation;Demonstration    Comprehension Verbalized understanding;Returned demonstration              PT Short Term Goals - 05/24/21 1340       PT SHORT TERM GOAL #1   Title Patient will be independent with HEP in order to improve functional outcomes.    Time 3    Period Weeks    Status On-going    Target Date 05/08/21      PT SHORT TERM GOAL #2   Title Patient will report at least 25% improvement in symptoms for improved quality of life.    Time 3    Period Weeks    Status Achieved    Target Date 05/08/21               PT Long Term Goals - 05/24/21 1340       PT LONG TERM GOAL #1   Title Patient will report at least 75% improvement in symptoms for improved quality of life.    Time 6    Period Weeks    Status Achieved    Target Date 05/29/21      PT LONG TERM GOAL #2   Title Patient will be able to ambulate at least 300 feet in 2MWT in order to demonstrate improved gait speed for community ambulation.    Time 6    Period Weeks    Status On-going    Target Date 05/29/21      PT LONG TERM GOAL #3   Title Patient will be able to complete 5x STS in under 15 seconds in order to reduce the risk of falls.    Time 6    Period Weeks    Status  On-going    Target Date 05/29/21                   Plan - 05/24/21 1323     Clinical Impression Statement Patient is a 86 y.o. male who originally presented to physical therapy on 04/17/21 for evaluation with referral for ambulatory dysfunction. He presented with deficits in bilateral LE strength, ROM, endurance, gait, balance, and functional mobility with ADL.  He was having to modify and restrict ADL as indicated by subjective information and objective measures which is affecting overall participation.  Currently, he shows overall good progress with improvement in functional mobility as shown in 2-minute walk test without need for rest with improved distance.  Patient also shows overall improvements in strength testing and activity tolerance.   Patient will benefit from continued skilled physical therapy in order to improve function and return to prior level of mobility.  Patient and therapist in agreement to potentially extend POC with focus on improving balance and endurance in order to achieve final goals.    Personal Factors and Comorbidities Comorbidity 3+;Fitness;Time since onset of injury/illness/exacerbation    Comorbidities anxiety, depression, cardiac/pacemaker, decreased endurance    Examination-Activity Limitations Squat;Stairs;Stand;Transfers;Lift;Locomotion Level;Hygiene/Grooming;Caring for Others    Examination-Participation Restrictions Lawyer;Shop;Cleaning;Community Activity;Meal Prep    Stability/Clinical Decision Making Stable/Uncomplicated    Rehab Potential Good    PT Frequency 2x / week    PT Duration 6 weeks    PT Treatment/Interventions ADLs/Self Care Home Management;Aquatic Therapy;Electrical Stimulation;Cryotherapy;Moist Heat;Traction;Ultrasound;Parrafin;Fluidtherapy;Contrast Bath;DME Instruction;Gait training;Stair training;Functional mobility training;Therapeutic activities;Therapeutic exercise;Balance training;Neuromuscular re-education;Patient/family education;Orthotic Fit/Training;Manual techniques;Compression bandaging;Scar mobilization;Dry needling;Energy conservation;Splinting;Taping;Spinal Manipulations;Joint Manipulations    PT Next Visit Plan progress gait and balance training, progress endurance    PT Home Exercise Plan hip abduction, seated march; 1/11: STS, LAQ 2/1 good posture with shoulder flexion in seated     Consulted and Agree with Plan of Care Patient             Patient will benefit from skilled therapeutic intervention in order to improve the following deficits and impairments:  Abnormal gait, Decreased endurance, Decreased activity tolerance, Decreased balance, Improper body mechanics, Decreased strength, Difficulty walking, Decreased mobility  Visit Diagnosis: Other abnormalities of gait and mobility  Muscle weakness (generalized)     Problem List Patient Active Problem List   Diagnosis Date Noted   COVID 12/09/2020   Nocturia 03/08/2020   Neck pain, chronic 08/02/2018   Pacemaker 08/02/2018   Shoulder pain, right 07/30/2018   Labile blood pressure 06/04/2018   Sinus node dysfunction ([place]) 03/24/2018   Fatigue 02/01/2018   Mild persistent asthma 12/06/2016   DOE (dyspnea on exertion) 07/02/2016   Snoring 02/20/2016   Carpal tunnel syndrome of left wrist 09/26/2015   Insomnia secondary to anxiety 06/19/2015   Sinus bradycardia, chronic 11/14/2014   Hyperpigmented skin lesion 11/14/2014   Vitamin D deficiency 02/15/2014   Anemia 01/25/2012   Depression, major, single episode, severe ([place]) 12/13/2010   Wheezing 10/12/2010   Allergic rhinitis 05/19/2010   SMOKELESS TOBACCO ABUSE 10/26/2009   Hearing loss 12/23/2007   OSTEOPENIA 12/23/2007   Hyperlipidemia LDL goal <100 09/08/2007   Obesity (BMI 30.0-34.9) 09/08/2007    1:59 PM, 05/24/21 Mearl Latin PT, DPT Physical Therapist at [place], [place], [address] Phone: [phone]   Fax:  [phone]  Name: Terry Love MRN: [id number] Date of Birth: 1936-03-12

## 2021-05-29 ENCOUNTER — Ambulatory Visit (HOSPITAL_COMMUNITY): Payer: Medicare Other | Admitting: Physical Therapy

## 2021-05-29 ENCOUNTER — Other Ambulatory Visit: Payer: Self-pay

## 2021-05-29 ENCOUNTER — Encounter (HOSPITAL_COMMUNITY): Payer: Self-pay | Admitting: Physical Therapy

## 2021-05-29 DIAGNOSIS — M6281 Muscle weakness (generalized): Secondary | ICD-10-CM | POA: Diagnosis not present

## 2021-05-29 DIAGNOSIS — R2689 Other abnormalities of gait and mobility: Secondary | ICD-10-CM

## 2021-05-29 NOTE — Therapy (Signed)
[place], [place], [address] Phone: [phone]   Fax:  [phone]  Physical Therapy Treatment/Recert  Patient Details  Name: Terry Love MRN: [id number] Date of Birth: 07-23-1935 Referring Provider (PT): Ronnie Derby MD   Encounter Date: 05/29/2021   PT End of Session - 05/29/21 1314     Visit Number 11    Number of Visits 20    Date for PT Re-Evaluation 06/26/21    Authorization Type Primary Medicare Secondary BCBS supplemental    Progress Note Due on Visit 20    PT Start Time 1315    PT Stop Time 1353    PT Time Calculation (min) 38 min    Activity Tolerance Patient tolerated treatment well;Patient limited by fatigue    Behavior During Therapy [facility] for tasks assessed/performed             Past Medical History:  Diagnosis Date   Anxiety    Asthma    Bradycardia    Chronic    Colon polyps    Depression    Facial tic    Glaucoma    Hearing loss    Heart disease    High cholesterol    Hyperlipidemia    Insomnia secondary to depression with anxiety 03/2015   Obesity    Seasonal allergies     Past Surgical History:  Procedure Laterality Date   carpal tunnel  release right  2006   COLONOSCOPY     COLONOSCOPY  01/25/2011   Dr. Rehman:Examination performed to cecum Pan colonic diverticulosis/2 small polyps ablated via cold biopsy from transverse colon/External hemorrhoids, benign polyps   COLONOSCOPY N/A 06/29/2013   Procedure: COLONOSCOPY;  Surgeon: Daneil Dolin, MD;  Location: [facility];  Service: Endoscopy;  Laterality: N/A;  10:45   EYE SURGERY     left eye cataracts    PACEMAKER IMPLANT N/A 03/24/2018   Procedure: PACEMAKER IMPLANT;  Surgeon: Evans Lance, MD;  Location: [facility];  Service: Cardiovascular;  Laterality: N/A;    There were no vitals filed for this visit.   Subjective Assessment - 05/29/21 1315     Subjective Feels alright but the energy isnt  there. Isnt sure if he wants to continue with therapy or not.    Pertinent History pacemaker    Limitations Standing;House hold activities;Walking    Patient Stated Goals improve gait and balance and improve health    Currently in Pain? No/denies                [facility] PT Assessment - 05/29/21 0001       Assessment   Medical Diagnosis Ambulatory Dysfunction    Referring Provider (PT) Ronnie Derby MD    Prior Therapy Wrists      Precautions   Precautions Fall      Restrictions   Weight Bearing Restrictions No      Balance Screen   Has the patient fallen in the past 6 months No    Has the patient had a decrease in activity level because of a fear of falling?  No    Is the patient reluctant to leave their home because of a fear of falling?  No      Prior Function   Level of Independence Independent    Vocation Retired      Associate Professor   Overall Cognitive Status Within Functional Limits for tasks assessed      Observation/Other Assessments  Observations Ambulates without AD, unsteady    Focus on Therapeutic Outcomes (FOTO)  n/a      Functional Tests   Functional tests Sit to Stand      Sit to Stand   Comments 5x STS: 23 sec, from chair without UE assist      Strength   Right Hip Flexion 5/5    Left Hip Flexion 5/5    Right Knee Flexion 5/5    Right Knee Extension 5/5    Left Knee Flexion 5/5    Left Knee Extension 5/5    Right Ankle Dorsiflexion 5/5    Left Ankle Dorsiflexion 4+/5      Transfers   Comments labored      Ambulation/Gait   Ambulation/Gait Yes    Ambulation Distance (Feet) 260 Feet   260   Assistive device None    Gait Pattern Trunk flexed;Wide base of support;Poor foot clearance - left;Poor foot clearance - right    Gait Comments 2MWT, 266ft no breaks                           [facility] Adult PT Treatment/Exercise - 05/29/21 0001       Knee/Hip Exercises: Aerobic   Nustep 6 min seat lv 4 seat 10 70-80 SPM       Knee/Hip Exercises: Standing   Forward Step Up Both;2 sets;10 reps;Hand Hold: 1;Step Height: 6"    Forward Step Up Limitations with contralateral knee drive    Other Standing Knee Exercises sidestepping in // bars, no UE support, 6RT                     PT Education - 05/29/21 1314     Education Details HEP, POC    Person(s) Educated Patient    Methods Explanation;Demonstration    Comprehension Verbalized understanding;Returned demonstration              PT Short Term Goals - 05/24/21 1340       PT SHORT TERM GOAL #1   Title Patient will be independent with HEP in order to improve functional outcomes.    Time 3    Period Weeks    Status On-going    Target Date 05/08/21      PT SHORT TERM GOAL #2   Title Patient will report at least 25% improvement in symptoms for improved quality of life.    Time 3    Period Weeks    Status Achieved    Target Date 05/08/21               PT Long Term Goals - 05/24/21 1340       PT LONG TERM GOAL #1   Title Patient will report at least 75% improvement in symptoms for improved quality of life.    Time 6    Period Weeks    Status Achieved    Target Date 05/29/21      PT LONG TERM GOAL #2   Title Patient will be able to ambulate at least 300 feet in 2MWT in order to demonstrate improved gait speed for community ambulation.    Time 6    Period Weeks    Status On-going    Target Date 05/29/21      PT LONG TERM GOAL #3   Title Patient will be able to complete 5x STS in under 15 seconds in order to reduce the risk of falls.  Time 6    Period Weeks    Status On-going    Target Date 05/29/21                   Plan - 05/29/21 1314     Clinical Impression Statement Demonstrating improving activity tolerance on nu step today with increased time and SPM. Discussed POC with patient and his wife and they are in agreeance to extend POC to improve mobility. Extending POC 2x/week for 4 weeks for continued  strength, balance and endurance deficits. Objective measures carried from PN last session. Patient will continue to benefit from skilled physical therapy in order to reduce impairment and improve function.    Personal Factors and Comorbidities Comorbidity 3+;Fitness;Time since onset of injury/illness/exacerbation    Comorbidities anxiety, depression, cardiac/pacemaker, decreased endurance    Examination-Activity Limitations Squat;Stairs;Stand;Transfers;Lift;Locomotion Level;Hygiene/Grooming;Caring for Others    Examination-Participation Restrictions Lawyer;Shop;Cleaning;Community Activity;Meal Prep    Stability/Clinical Decision Making Stable/Uncomplicated    Rehab Potential Good    PT Frequency 2x / week    PT Duration 6 weeks    PT Treatment/Interventions ADLs/Self Care Home Management;Aquatic Therapy;Electrical Stimulation;Cryotherapy;Moist Heat;Traction;Ultrasound;Parrafin;Fluidtherapy;Contrast Bath;DME Instruction;Gait training;Stair training;Functional mobility training;Therapeutic activities;Therapeutic exercise;Balance training;Neuromuscular re-education;Patient/family education;Orthotic Fit/Training;Manual techniques;Compression bandaging;Scar mobilization;Dry needling;Energy conservation;Splinting;Taping;Spinal Manipulations;Joint Manipulations    PT Next Visit Plan progress gait and balance training, progress endurance    PT Home Exercise Plan hip abduction, seated march; 1/11: STS, LAQ 2/1 good posture with shoulder flexion in seated    Consulted and Agree with Plan of Care Patient             Patient will benefit from skilled therapeutic intervention in order to improve the following deficits and impairments:  Abnormal gait, Decreased endurance, Decreased activity tolerance, Decreased balance, Improper body mechanics, Decreased strength, Difficulty walking, Decreased mobility  Visit Diagnosis: Other abnormalities of gait and mobility  Muscle weakness  (generalized)     Problem List Patient Active Problem List   Diagnosis Date Noted   COVID 12/09/2020   Nocturia 03/08/2020   Neck pain, chronic 08/02/2018   Pacemaker 08/02/2018   Shoulder pain, right 07/30/2018   Labile blood pressure 06/04/2018   Sinus node dysfunction ([place]) 03/24/2018   Fatigue 02/01/2018   Mild persistent asthma 12/06/2016   DOE (dyspnea on exertion) 07/02/2016   Snoring 02/20/2016   Carpal tunnel syndrome of left wrist 09/26/2015   Insomnia secondary to anxiety 06/19/2015   Sinus bradycardia, chronic 11/14/2014   Hyperpigmented skin lesion 11/14/2014   Vitamin D deficiency 02/15/2014   Anemia 01/25/2012   Depression, major, single episode, severe ([place]) 12/13/2010   Wheezing 10/12/2010   Allergic rhinitis 05/19/2010   SMOKELESS TOBACCO ABUSE 10/26/2009   Hearing loss 12/23/2007   OSTEOPENIA 12/23/2007   Hyperlipidemia LDL goal <100 09/08/2007   Obesity (BMI 30.0-34.9) 09/08/2007    1:57 PM, 05/29/21 Mearl Latin PT, DPT Physical Therapist at [place], [place], [address] Phone: [phone]   Fax:  [phone]  Name: Terry Love MRN: [id number] Date of Birth: 12-07-1935

## 2021-05-29 NOTE — Addendum Note (Signed)
Addended by: Mearl Latin on: 05/29/2021 02:02 PM   Modules accepted: Orders

## 2021-06-01 ENCOUNTER — Emergency Department (HOSPITAL_COMMUNITY): Payer: Medicare Other

## 2021-06-01 ENCOUNTER — Other Ambulatory Visit: Payer: Self-pay

## 2021-06-01 ENCOUNTER — Emergency Department (HOSPITAL_COMMUNITY)
Admission: EM | Admit: 2021-06-01 | Discharge: 2021-06-01 | Disposition: A | Discharge status: Home / Self Care | Payer: Medicare Other | Attending: Emergency Medicine | Admitting: Emergency Medicine

## 2021-06-01 ENCOUNTER — Ambulatory Visit (HOSPITAL_COMMUNITY): Payer: Medicare Other | Admitting: Physical Therapy

## 2021-06-01 ENCOUNTER — Encounter (HOSPITAL_COMMUNITY): Payer: Self-pay

## 2021-06-01 DIAGNOSIS — W06XXXA Fall from bed, initial encounter: Secondary | ICD-10-CM | POA: Diagnosis not present

## 2021-06-01 DIAGNOSIS — Y92003 Bedroom of unspecified non-institutional (private) residence as the place of occurrence of the external cause: Secondary | ICD-10-CM | POA: Diagnosis not present

## 2021-06-01 DIAGNOSIS — W19XXXA Unspecified fall, initial encounter: Secondary | ICD-10-CM

## 2021-06-01 DIAGNOSIS — S01412A Laceration without foreign body of left cheek and temporomandibular area, initial encounter: Secondary | ICD-10-CM | POA: Diagnosis not present

## 2021-06-01 DIAGNOSIS — Z7982 Long term (current) use of aspirin: Secondary | ICD-10-CM | POA: Diagnosis not present

## 2021-06-01 DIAGNOSIS — S0181XA Laceration without foreign body of other part of head, initial encounter: Secondary | ICD-10-CM | POA: Insufficient documentation

## 2021-06-01 DIAGNOSIS — S0993XA Unspecified injury of face, initial encounter: Secondary | ICD-10-CM | POA: Diagnosis present

## 2021-06-01 DIAGNOSIS — S0592XA Unspecified injury of left eye and orbit, initial encounter: Secondary | ICD-10-CM | POA: Diagnosis not present

## 2021-06-01 MED ORDER — ACETAMINOPHEN 325 MG PO TABS
ORAL_TABLET | ORAL | Status: AC
  Filled 2021-06-01: qty 2

## 2021-06-01 MED ORDER — ACETAMINOPHEN 325 MG PO TABS
650.0000 mg | ORAL_TABLET | Freq: Once | ORAL | Status: AC
Start: 2021-06-01 — End: 2021-06-01
  Administered 2021-06-01: 650 mg via ORAL

## 2021-06-01 NOTE — Discharge Instructions (Addendum)
It was a pleasure taking care of you today!  Your CT scan was unremarkable.  The cut under your eye was repaired in the ED with glue and Steri-Strips.  You may refer to the laceration care instructions regarding care following use of glue and Steri-Strips.  You may take over-the-counter 500 mg Tylenol every 6 hours as needed for pain for no more than 7 days.  It was an incidental finding to your thyroid, recommend follow-up with your primary care provider for an outpatient thyroid ultrasound to be performed.  You may call your primary care provider to set up that follow-up appointment. Return to the ED if you are experiencing increasing/worsening headache, vision changes, vomiting, worsening symptoms.

## 2021-06-01 NOTE — ED Triage Notes (Signed)
Pt presents to ED after fall out of bed this am. Pt hit side of his left eye on the nightstand. Denies LOC or blood thinner use.

## 2021-06-01 NOTE — ED Provider Notes (Signed)
[facility] Provider Note   CSN: [id number] Arrival date & time: 06/01/21  2536     History  Chief Complaint  Patient presents with   Terry Love   JOHNPATRICK Love is a 86 y.o. male who presents to the Emergency Department complaining of a fall onset prior to arrival. Pt notes that he fell while getting out of his bed this morning and hit his left eye on the nightstand.  Denies feeling dizzy or lightheaded prior to hitting his head. Pt has associated laceration underneath left eye, headache. He tried wound care for his symptoms.  Denies LOC, vision changes, nausea, vomiting.  Denies anticoagulant use.   The history is provided by the patient and the spouse. No language interpreter was used.      Home Medications Prior to Admission medications   Medication Sig Start Date End Date Taking? Authorizing Provider  albuterol (VENTOLIN HFA) 108 (90 Base) MCG/ACT inhaler INHALE 2 PUFFS INTO THE LUNGS EVERY 4 (FOUR) HOURS AS NEEDED FOR WHEEZING OR SHORTNESS OF BREATH 06/18/19   Mannam, Hart Robinsons, MD  aspirin 81 MG EC tablet Take 81 mg by mouth at bedtime.    [provider]  atorvastatin (LIPITOR) 40 MG tablet Take 1 tablet (40 mg total) by mouth at bedtime. 06/09/20   Fayrene Helper, MD  azelastine (ASTELIN) 0.1 % nasal spray Place 2 sprays into both nostrils 2 (two) times daily. Use in each nostril as directed 04/29/19   Fayrene Helper, MD  benzonatate (TESSALON) 100 MG capsule Take 1 capsule (100 mg total) by mouth 2 (two) times daily as needed for cough. 03/21/21   Fayrene Helper, MD  budesonide-formoterol [facility]) 160-4.5 MCG/ACT inhaler Inhale 2 puffs into the lungs in the morning and at bedtime. 05/01/21   Mannam, Hart Robinsons, MD  calcium-vitamin D (OSCAL WITH D) 500-200 MG-UNIT tablet Take 1 tablet by mouth 2 (two) times daily. 10/01/16   Fayrene Helper, MD  carboxymethylcellulose (REFRESH PLUS) 0.5 % SOLN INSTILL 1 DROP IN [facility] EYE FOUR TIMES A DAY  01/02/21   [provider]  dorzolamide (TRUSOPT) 2 % ophthalmic solution 1 drop 2 (two) times daily.    [provider]  dorzolamide-timolol (COSOPT) 22.3-6.8 MG/ML ophthalmic solution INSTILL 1 DROP IN [facility] EYE TWO TIMES A DAY 01/02/21   [provider]  famotidine (PEPCID) 20 MG tablet One after supper 05/09/21   Tanda Rockers, MD  furosemide (LASIX) 40 MG tablet Take 1 tablet (40 mg total) by mouth daily. 02/17/21   Shirley Friar, PA-C  latanoprost (XALATAN) 0.005 % ophthalmic solution Place 1 drop into both eyes at bedtime.    [provider]  montelukast (SINGULAIR) 10 MG tablet Take 1 tablet (10 mg total) by mouth at bedtime. 04/26/21   Fayrene Helper, MD  Multiple Vitamin (MULTIVITAMIN WITH MINERALS) TABS tablet Take 1 tablet by mouth at bedtime.    [provider]  omeprazole (PRILOSEC) 40 MG capsule TAKE 1 CAPSULE (40 MG TOTAL) BY MOUTH DAILY. 03/06/21   Fayrene Helper, MD  sertraline (ZOLOFT) 25 MG tablet Take 25 mg by mouth daily. 10/14/20   [provider]  tamsulosin (FLOMAX) 0.4 MG CAPS capsule Take 1 capsule (0.4 mg total) by mouth daily. 10/21/20   McKenzie, Candee Furbish, MD  venlafaxine XR (EFFEXOR-XR) 150 MG 24 hr capsule Take 1 capsule by mouth every morning. 12/07/20   [provider]  vitamin C (ASCORBIC ACID) 500 MG tablet Take 500  mg by mouth at bedtime.     [provider]  Vitamin D, Ergocalciferol, (DRISDOL) 1.25 MG (50000 UNIT) CAPS capsule TAKE 1 CAPSULE (50,000 UNITS TOTAL) BY MOUTH EVERY MONDAY. IN THE MORNING 12/19/20   Fayrene Helper, MD  Wheat Dextrin (BENEFIBER DRINK MIX PO) Take 1 Dose by mouth daily as needed (for regularity). Use as directed daily    [provider]      Allergies    Patient has no known allergies.    Review of Systems   Review of Systems  Eyes:  Negative for visual disturbance.  Gastrointestinal:  Negative for nausea and vomiting.  Skin:   Positive for wound.  Neurological:  Positive for headaches. Negative for dizziness, syncope and light-headedness.  All other systems reviewed and are negative.  Physical Exam Updated Vital Signs BP 134/81 (BP Location: Right Arm)    Pulse 80    Temp 97.9 F (36.6 C) (Oral)    Resp 18    Ht 5\' 10"  (1.778 m)    Wt 96.2 kg    SpO2 97%    BMI 30.42 kg/m  Physical Exam Vitals and nursing note reviewed.  Constitutional:      General: He is not in acute distress.    Appearance: He is not diaphoretic.  HENT:     Head: Normocephalic. Laceration present.     Comments: 1 cm laceration noted to left cheek.  Minimal ecchymosis noted to the area.  No tenderness to palpation noted to area.    Mouth/Throat:     Pharynx: No oropharyngeal exudate.  Eyes:     General: No scleral icterus.    Conjunctiva/sclera: Conjunctivae normal.  Cardiovascular:     Rate and Rhythm: Normal rate and regular rhythm.     Pulses: Normal pulses.     Heart sounds: Normal heart sounds.  Pulmonary:     Effort: Pulmonary effort is normal. No respiratory distress.     Breath sounds: Normal breath sounds. No wheezing.  Abdominal:     General: Bowel sounds are normal.     Palpations: Abdomen is soft. There is no mass.     Tenderness: There is no abdominal tenderness. There is no guarding or rebound.  Musculoskeletal:        General: Normal range of motion.     Cervical back: Normal range of motion and neck supple.     Comments: Strength and sensation intact to bilateral upper and lower extremities.  No C, T, L, S spinal tenderness to palpation.  Skin:    General: Skin is warm and dry.  Neurological:     Mental Status: He is alert.     Cranial Nerves: Cranial nerves 2-12 are intact.     Sensory: Sensation is intact.  Psychiatric:        Behavior: Behavior normal.    ED Results / Procedures / Treatments   Labs (all labs ordered are listed, but only abnormal results are displayed) Labs Reviewed - No data to  display  EKG None  Radiology CT Head Wo Contrast  Result Date: 06/01/2021 CLINICAL DATA:  Follow-up above this morning. Hit left side of left eye on night stand. EXAM: CT HEAD WITHOUT CONTRAST CT CERVICAL SPINE WITHOUT CONTRAST TECHNIQUE: Multidetector CT imaging of the head and cervical spine was performed following the standard protocol without intravenous contrast. Multiplanar CT image reconstructions of the cervical spine were also generated. RADIATION DOSE REDUCTION: This exam was performed according to the departmental dose-optimization program  which includes automated exposure control, adjustment of the mA and/or kV according to patient size and/or use of iterative reconstruction technique. COMPARISON:  MRI head 10/24/2020 FINDINGS: CT HEAD FINDINGS Brain: No evidence of acute infarction, hemorrhage, hydrocephalus, extra-axial collection or mass lesion/mass effect. Diffuse cortical atrophy. Periventricular white matter hypodensity is a nonspecific finding, but most commonly relates to chronic ischemic small vessel disease. Vascular: No hyperdense vessel or unexpected calcification. Skull: Normal. Negative for fracture or focal lesion. Sinuses/Orbits: No acute finding. Other: None. CT CERVICAL SPINE FINDINGS Alignment: Within normal limits. Skull base and vertebrae: No acute fracture. No primary bone lesion or focal pathologic process. Soft tissues and spinal canal: No prevertebral fluid or swelling. No visible canal hematoma. Disc levels: Severe degenerative changes seen throughout the cervical spine. Upper chest: 3.3 cm soft tissue density mass seen in the left anterior mediastinum. This may be a portion of the thyroid extending into the chest or additional chest mass. Visualized lung apices are clear. Other: None. IMPRESSION: 1. No acute intracranial abnormality. 2. No acute abnormality of the cervical or visualized upper thoracic spine. 3. 3.3 cm left anterior mediastinal mass may be due to  inferiorly extending thyroid nodule or lymphadenopathy. Further evaluation with thyroid ultrasound should be performed. Electronically Signed   By: Miachel Roux M.D.   On: 06/01/2021 10:52   CT Cervical Spine Wo Contrast  Result Date: 06/01/2021 CLINICAL DATA:  Follow-up above this morning. Hit left side of left eye on night stand. EXAM: CT HEAD WITHOUT CONTRAST CT CERVICAL SPINE WITHOUT CONTRAST TECHNIQUE: Multidetector CT imaging of the head and cervical spine was performed following the standard protocol without intravenous contrast. Multiplanar CT image reconstructions of the cervical spine were also generated. RADIATION DOSE REDUCTION: This exam was performed according to the departmental dose-optimization program which includes automated exposure control, adjustment of the mA and/or kV according to patient size and/or use of iterative reconstruction technique. COMPARISON:  MRI head 10/24/2020 FINDINGS: CT HEAD FINDINGS Brain: No evidence of acute infarction, hemorrhage, hydrocephalus, extra-axial collection or mass lesion/mass effect. Diffuse cortical atrophy. Periventricular white matter hypodensity is a nonspecific finding, but most commonly relates to chronic ischemic small vessel disease. Vascular: No hyperdense vessel or unexpected calcification. Skull: Normal. Negative for fracture or focal lesion. Sinuses/Orbits: No acute finding. Other: None. CT CERVICAL SPINE FINDINGS Alignment: Within normal limits. Skull base and vertebrae: No acute fracture. No primary bone lesion or focal pathologic process. Soft tissues and spinal canal: No prevertebral fluid or swelling. No visible canal hematoma. Disc levels: Severe degenerative changes seen throughout the cervical spine. Upper chest: 3.3 cm soft tissue density mass seen in the left anterior mediastinum. This may be a portion of the thyroid extending into the chest or additional chest mass. Visualized lung apices are clear. Other: None. IMPRESSION: 1. No  acute intracranial abnormality. 2. No acute abnormality of the cervical or visualized upper thoracic spine. 3. 3.3 cm left anterior mediastinal mass may be due to inferiorly extending thyroid nodule or lymphadenopathy. Further evaluation with thyroid ultrasound should be performed. Electronically Signed   By: Miachel Roux M.D.   On: 06/01/2021 10:52    Procedures .Marland KitchenLaceration Repair  Date/Time: 06/01/2021 11:30 AM Performed by: Nehemiah Settle, PA-C Authorized by: Nehemiah Settle, PA-C   Consent:    Consent obtained:  Verbal   Consent given by:  Patient   Risks, benefits, and alternatives were discussed: yes     Risks discussed:  Pain and infection   Alternatives discussed:  No treatment Universal protocol:    Patient identity confirmed:  Verbally with patient and hospital-assigned identification number Anesthesia:    Anesthesia method:  None Laceration details:    Location:  Face   Face location:  L cheek   Length (cm):  1 Pre-procedure details:    Preparation:  Patient was prepped and draped in usual sterile fashion Exploration:    Hemostasis achieved with:  Direct pressure   Imaging outcome: foreign body not noted     Wound exploration: entire depth of wound visualized     Contaminated: no   Skin repair:    Repair method:  Steri-Strips and tissue adhesive   Number of Steri-Strips:  2 Approximation:    Approximation:  Close Repair type:    Repair type:  Simple Post-procedure details:    Dressing:  Open (no dressing)   Procedure completion:  Tolerated well, no immediate complications    Medications Ordered in ED Medications  acetaminophen (TYLENOL) tablet 650 mg (has no administration in time range)    ED Course/ Medical Decision Making/ A&P Clinical Course as of 06/01/21 1132  Thu Jun 01, 2021  1056 Discussed imaging findings with patient and wife at bedside.  Will give patient Tylenol prior to discharge for headache.  Patient appreciative.  Discussed discharge  treatment plan.  Patient agreeable at this time.  Patient appears safe for discharge. [SB]    Clinical Course User Index [SB] Lianny Molter A, PA-C                           Medical Decision Making  Pt with fall occurring prior to arrival.  Patient was attempting to get out of bed when he accidentally hit his left eye on the nightstand.  Patient has a headache at this time.  Denies vision changes, vomiting.  Denies anticoagulant use.  Vital signs stable, patient afebrile, not tachycardic or hypoxic.  On exam patient with 1 cm laceration noted to left cheek inferior to left eye.  No focal neurological deficits.  No spinal tenderness to palpation.  No acute cardiovascular or respiratory exam findings.  Differential diagnosis includes fracture, herniation, laceration.   Imaging: I ordered imaging studies including CT head, CT cervical spine I independently visualized and interpreted imaging which showed: Negative for acute intracranial abnormality.  Negative for fracture or herniation of cervical spine.  Incidental finding of left anterior mediastinal mass, due to inferiorly extending thyroid nodule and lymphadenopathy. I agree with the radiologist interpretation  Medications:  I ordered medication including Tylenol for headache Reevaluation of the patient after these medicines showed that the patient improved I have reviewed the patients home medicines and have made adjustments as needed  Reevaluation: After the interventions noted above, I reevaluated the patient and found that they have :improved   Disposition: Patient presentation suspicious for laceration status post fall and hitting left cheek on nightstand.  Doubt intracranial bleed, fracture, herniation at this time.  After consideration of the diagnostic results and the patients response to treatment, I feel that the patient would benefit from discharge home.  Discussed with patient and wife at bedside regarding incidental findings and  recommendation for thyroid ultrasound.  Discussed with patient and wife to schedule follow-up appointment with primary care provider to set up an outpatient thyroid ultrasound. Supportive care measures and strict return precautions discussed with patient at bedside. Pt acknowledges and verbalizes understanding. Pt appears safe for discharge. Follow up as indicated in discharge paperwork.  This chart was dictated using voice recognition software, Dragon. Despite the best efforts of this provider to proofread and correct errors, errors may still occur which can change documentation meaning.  Final Clinical Impression(s) / ED Diagnoses Final diagnoses:  Fall, initial encounter  Facial laceration, initial encounter    Rx / DC Orders ED Discharge Orders     None         Erven Ramson A, PA-C 06/01/21 1134    Milton Ferguson, MD 06/02/21 1553

## 2021-06-06 ENCOUNTER — Ambulatory Visit (HOSPITAL_COMMUNITY): Payer: Medicare Other

## 2021-06-06 ENCOUNTER — Other Ambulatory Visit: Payer: Self-pay

## 2021-06-06 DIAGNOSIS — M6281 Muscle weakness (generalized): Secondary | ICD-10-CM

## 2021-06-06 DIAGNOSIS — R2689 Other abnormalities of gait and mobility: Secondary | ICD-10-CM

## 2021-06-06 NOTE — Therapy (Signed)
[place], [place], [address] Phone: [phone]   Fax:  [phone]  Physical Therapy Treatment  Patient Details  Name: Terry Love MRN: [id number] Date of Birth: December 04, 1935 Referring Provider (PT): Ronnie Derby MD   Encounter Date: 06/06/2021   PT End of Session - 06/06/21 0905     Visit Number 12    Number of Visits 20    Date for PT Re-Evaluation 06/26/21    Authorization Type Primary Medicare Secondary BCBS supplemental    Progress Note Due on Visit 20    PT Start Time 0904    PT Stop Time 0946    PT Time Calculation (min) 42 min    Activity Tolerance Patient tolerated treatment well;Patient limited by fatigue    Behavior During Therapy [facility] for tasks assessed/performed             Past Medical History:  Diagnosis Date   Anxiety    Asthma    Bradycardia    Chronic    Colon polyps    Depression    Facial tic    Glaucoma    Hearing loss    Heart disease    High cholesterol    Hyperlipidemia    Insomnia secondary to depression with anxiety 03/2015   Obesity    Seasonal allergies     Past Surgical History:  Procedure Laterality Date   carpal tunnel  release right  2006   COLONOSCOPY     COLONOSCOPY  01/25/2011   Dr. Rehman:Examination performed to cecum Pan colonic diverticulosis/2 small polyps ablated via cold biopsy from transverse colon/External hemorrhoids, benign polyps   COLONOSCOPY N/A 06/29/2013   Procedure: COLONOSCOPY;  Surgeon: Daneil Dolin, MD;  [facility];  Service: Endoscopy;  Laterality: N/A;  10:45   EYE SURGERY     left eye cataracts    PACEMAKER IMPLANT N/A 03/24/2018   Procedure: PACEMAKER IMPLANT;  Surgeon: Evans Lance, MD;  Location: [facility];  Service: Cardiovascular;  Laterality: N/A;    There were no vitals filed for this visit.   Subjective Assessment - 06/06/21 0908     Subjective Patient states he had a fall last week on  Thursday getting out of bed.  He states he is still quite sore.  He states he is on "alot of medicine" but doesn't know what caused his fall; sometimes he feels lightheaded and SOB    Pertinent History pacemaker    Limitations Standing;House hold activities;Walking    Patient Stated Goals improve gait and balance and improve health    Currently in Pain? Yes    Pain Score 3     Pain Location Other (Comment)    Pain Descriptors / Indicators Sore    Pain Onset In the past 7 days    Aggravating Factors  general soreness                [facility] PT Assessment - 06/06/21 0001       Assessment   Medical Diagnosis Ambulatory Dysfunction    Referring Provider (PT) Delia Meltontate MD      Precautions   Precautions Fall      Restrictions   Weight Bearing Restrictions No      Balance Screen   Has the patient fallen in the past 6 months Yes    How many times? 1   fell last Thursday   Has the patient had a decrease in  activity level because of a fear of falling?  Yes    Is the patient reluctant to leave their home because of a fear of falling?  Yes      Prior Function   Level of Independence Independent    Vocation Retired      Associate Professor   Overall Cognitive Status Within Functional Limits for tasks assessed                           [facility] Adult PT Treatment/Exercise - 06/06/21 0001       Ambulation/Gait   Assistive device Straight cane    Gait Pattern Trunk flexed      Knee/Hip Exercises: Aerobic   Nustep 8 min seat lv 4 seat 10 70-80 SPM      Knee/Hip Exercises: Standing   Heel Raises 10 reps    Hip Flexion 10 reps    Hip Abduction 10 reps;Both    Forward Step Up Both;10 reps;Step Height: 4"    Gait Training x 226 ft with [facility]    Other Standing Knee Exercises sidestepping with hand held assist x 10 ft x 4 passes    Other Standing Knee Exercises green Tband rows and extensions 2 sets of 10      Knee/Hip Exercises: Seated   Sit to Sand 5 reps                      PT Education - 06/06/21 0926     Education Details review of HEP, fall precautions, correct use of SPC    Person(s) Educated Patient    Methods Explanation;Demonstration;Verbal cues    Comprehension Returned demonstration;Verbalized understanding              PT Short Term Goals - 05/24/21 1340       PT SHORT TERM GOAL #1   Title Patient will be independent with HEP in order to improve functional outcomes.    Time 3    Period Weeks    Status On-going    Target Date 05/08/21      PT SHORT TERM GOAL #2   Title Patient will report at least 25% improvement in symptoms for improved quality of life.    Time 3    Period Weeks    Status Achieved    Target Date 05/08/21               PT Long Term Goals - 05/24/21 1340       PT LONG TERM GOAL #1   Title Patient will report at least 75% improvement in symptoms for improved quality of life.    Time 6    Period Weeks    Status Achieved    Target Date 05/29/21      PT LONG TERM GOAL #2   Title Patient will be able to ambulate at least 300 feet in 2MWT in order to demonstrate improved gait speed for community ambulation.    Time 6    Period Weeks    Status On-going    Target Date 05/29/21      PT LONG TERM GOAL #3   Title Patient will be able to complete 5x STS in under 15 seconds in order to reduce the risk of falls.    Time 6    Period Weeks    Status On-going    Target Date 05/29/21  Plan - 06/06/21 0928     Clinical Impression Statement Patient states he had a fall last Thursday and he has steristrips over his L upper cheekbone.  Patient states overall soreness from his fall.  therapist discussed with him using a cane to improve balance with ambulation and fall preventions strategies including sitting on EOB for a minute after supine to sit to avoid falling from any feeling of lightheadness    Personal Factors and Comorbidities Comorbidity 3+;Fitness;Time since  onset of injury/illness/exacerbation    Comorbidities anxiety, depression, cardiac/pacemaker, decreased endurance    Examination-Activity Limitations Squat;Stairs;Stand;Transfers;Lift;Locomotion Level;Hygiene/Grooming;Caring for Others    Examination-Participation Restrictions Lawyer;Shop;Cleaning;Community Activity;Meal Prep    Stability/Clinical Decision Making Stable/Uncomplicated    Rehab Potential Good    PT Frequency 2x / week    PT Treatment/Interventions ADLs/Self Care Home Management;Aquatic Therapy;Electrical Stimulation;Cryotherapy;Moist Heat;Traction;Ultrasound;Parrafin;Fluidtherapy;Contrast Bath;DME Instruction;Gait training;Stair training;Functional mobility training;Therapeutic activities;Therapeutic exercise;Balance training;Neuromuscular re-education;Patient/family education;Orthotic Fit/Training;Manual techniques;Compression bandaging;Scar mobilization;Dry needling;Energy conservation;Splinting;Taping;Spinal Manipulations;Joint Manipulations    PT Next Visit Plan progress gait and balance training, progress endurance    PT Home Exercise Plan hip abduction, seated march; 1/11: STS, LAQ 2/1 good posture with shoulder flexion in seated    Consulted and Agree with Plan of Care Patient             Patient will benefit from skilled therapeutic intervention in order to improve the following deficits and impairments:  Abnormal gait, Decreased endurance, Decreased activity tolerance, Decreased balance, Improper body mechanics, Decreased strength, Difficulty walking, Decreased mobility  Visit Diagnosis: Other abnormalities of gait and mobility  Muscle weakness (generalized)     Problem List Patient Active Problem List   Diagnosis Date Noted   COVID 12/09/2020   Nocturia 03/08/2020   Neck pain, chronic 08/02/2018   Pacemaker 08/02/2018   Shoulder pain, right 07/30/2018   Labile blood pressure 06/04/2018   Sinus node dysfunction ([place]) 03/24/2018   Fatigue  02/01/2018   Mild persistent asthma 12/06/2016   DOE (dyspnea on exertion) 07/02/2016   Snoring 02/20/2016   Carpal tunnel syndrome of left wrist 09/26/2015   Insomnia secondary to anxiety 06/19/2015   Sinus bradycardia, chronic 11/14/2014   Hyperpigmented skin lesion 11/14/2014   Vitamin D deficiency 02/15/2014   Anemia 01/25/2012   Depression, major, single episode, severe ([place]) 12/13/2010   Wheezing 10/12/2010   Allergic rhinitis 05/19/2010   SMOKELESS TOBACCO ABUSE 10/26/2009   Hearing loss 12/23/2007   OSTEOPENIA 12/23/2007   Hyperlipidemia LDL goal <100 09/08/2007   Obesity (BMI 30.0-34.9) 09/08/2007    9:54 AM, 06/06/21 [facility] physical therapy [place] [id number] [place], [place], [address] Phone: [phone]   Fax:  [phone]  Name: BARD HAUPERT MRN: [id number] Date of Birth: Nov 26, 1935

## 2021-06-10 DIAGNOSIS — Z20822 Contact with and (suspected) exposure to covid-19: Secondary | ICD-10-CM | POA: Diagnosis not present

## 2021-06-14 ENCOUNTER — Ambulatory Visit (INDEPENDENT_AMBULATORY_CARE_PROVIDER_SITE_OTHER): Payer: Medicare Other | Admitting: Family Medicine

## 2021-06-14 ENCOUNTER — Other Ambulatory Visit: Payer: Self-pay

## 2021-06-14 ENCOUNTER — Encounter: Payer: Self-pay | Admitting: Family Medicine

## 2021-06-14 ENCOUNTER — Ambulatory Visit (HOSPITAL_COMMUNITY): Payer: Medicare Other | Attending: Family Medicine | Admitting: Physical Therapy

## 2021-06-14 VITALS — BP 116/69 | HR 68 | Ht 70.0 in | Wt 219.0 lb

## 2021-06-14 DIAGNOSIS — E041 Nontoxic single thyroid nodule: Secondary | ICD-10-CM | POA: Diagnosis not present

## 2021-06-14 DIAGNOSIS — R2689 Other abnormalities of gait and mobility: Secondary | ICD-10-CM | POA: Diagnosis not present

## 2021-06-14 DIAGNOSIS — Z09 Encounter for follow-up examination after completed treatment for conditions other than malignant neoplasm: Secondary | ICD-10-CM | POA: Diagnosis not present

## 2021-06-14 DIAGNOSIS — M6281 Muscle weakness (generalized): Secondary | ICD-10-CM | POA: Diagnosis not present

## 2021-06-14 DIAGNOSIS — S0181XD Laceration without foreign body of other part of head, subsequent encounter: Secondary | ICD-10-CM

## 2021-06-14 NOTE — Therapy (Signed)
[place] [facility] ?[address] ?[place], [place], [address] ?Phone: [phone]   Fax:  [phone] ? ?Physical Therapy Treatment ? ?Patient Details  ?Name: Terry Love ?MRN: [id number] ?Date of Birth: 03/04/1936 ?Referring Provider (PT): Ronnie Derby MD ? ? ?Encounter Date: 06/14/2021 ? ? PT End of Session - 06/14/21 1037   ? ? Visit Number 13   ? Number of Visits 20   ? Date for PT Re-Evaluation 06/26/21   ? Authorization Type Primary Medicare Secondary BCBS supplemental   ? Progress Note Due on Visit 20   ? PT Start Time 3220   ? PT Stop Time 1028   ? PT Time Calculation (min) 43 min   ? Activity Tolerance Patient tolerated treatment well;Patient limited by fatigue   ? Behavior During Therapy [facility] for tasks assessed/performed   ? ?  ?  ? ?  ? ? ?Past Medical History:  ?Diagnosis Date  ? Anxiety   ? Asthma   ? Bradycardia   ? Chronic   ? Colon polyps   ? Depression   ? Facial tic   ? Glaucoma   ? Hearing loss   ? Heart disease   ? High cholesterol   ? Hyperlipidemia   ? Insomnia secondary to depression with anxiety 03/2015  ? Obesity   ? Seasonal allergies   ? ? ?Past Surgical History:  ?Procedure Laterality Date  ? carpal tunnel  release right  2006  ? COLONOSCOPY    ? COLONOSCOPY  01/25/2011  ? Dr. Rehman:Examination performed to cecum Pan colonic diverticulosis/2 small polyps ablated via cold biopsy from transverse colon/External hemorrhoids, benign polyps  ? COLONOSCOPY N/A 06/29/2013  ? Procedure: COLONOSCOPY;  Surgeon: Daneil Dolin, MD;  [facility];  Service: Endoscopy;  Laterality: N/A;  10:45  ? EYE SURGERY    ? left eye cataracts   ? PACEMAKER IMPLANT N/A 03/24/2018  ? Procedure: PACEMAKER IMPLANT;  Surgeon: Evans Lance, MD;  Location: [facility];  Service: Cardiovascular;  Laterality: N/A;  ? ? ?There were no vitals filed for this visit. ? ? Subjective Assessment - 06/14/21 1422   ? ? Subjective Patient reports that his balance has still  been difficult and he feels like he is moving slow.   ? Currently in Pain? No/denies   ? ?  ?  ? ?  ? ? ? ? ? ? ? ? ? ? ? ? ? ? ? ? ? ? ? ? [facility] Adult PT Treatment/Exercise - 06/14/21 0001   ? ?  ? Knee/Hip Exercises: Aerobic  ? Nustep x78mn seat seat 10 80-90 SPM   ?  ? Knee/Hip Exercises: Standing  ? Other Standing Knee Exercises Cable knee drive #2 2x8   ?  ? Manual Therapy  ? Manual Therapy Passive ROM   ? Manual therapy comments Thomas stretch x115m(intermittent 20s holds)   ? ?  ?  ? ?  ? ? ? ? ? ? ? ? ? ? PT Education - 06/14/21 1423   ? ? Education Details Anterior hip tension in relation to low back pain and standing posture.   ? Person(s) Educated Patient   ? Methods Explanation;Demonstration   ? Comprehension Verbalized understanding   ? ?  ?  ? ?  ? ? ? PT Short Term Goals - 05/24/21 1340   ? ?  ? PT SHORT TERM GOAL #1  ? Title Patient will be independent with HEP  in order to improve functional outcomes.   ? Time 3   ? Period Weeks   ? Status On-going   ? Target Date 05/08/21   ?  ? PT SHORT TERM GOAL #2  ? Title Patient will report at least 25% improvement in symptoms for improved quality of life.   ? Time 3   ? Period Weeks   ? Status Achieved   ? Target Date 05/08/21   ? ?  ?  ? ?  ? ? ? ? PT Long Term Goals - 05/24/21 1340   ? ?  ? PT LONG TERM GOAL #1  ? Title Patient will report at least 75% improvement in symptoms for improved quality of life.   ? Time 6   ? Period Weeks   ? Status Achieved   ? Target Date 05/29/21   ?  ? PT LONG TERM GOAL #2  ? Title Patient will be able to ambulate at least 300 feet in 2MWT in order to demonstrate improved gait speed for community ambulation.   ? Time 6   ? Period Weeks   ? Status On-going   ? Target Date 05/29/21   ?  ? PT LONG TERM GOAL #3  ? Title Patient will be able to complete 5x STS in under 15 seconds in order to reduce the risk of falls.   ? Time 6   ? Period Weeks   ? Status On-going   ? Target Date 05/29/21   ? ?  ?  ? ?  ? ? ? ? ? ? ? ? Plan -  06/14/21 1425   ? ? Clinical Impression Statement Patient tolerated treatment well during today's session and demonstrated an improvement in standing posture by the end. It was noted at the beginning of the session that the patient had a severe anterior hip rotation bilaterally while maintaining a straight lumbar spine. Following the [facility] stretch, there was both a visual improvement as well as a verbal improvement indicated on the part of the patient. I did have a discussion with the patient regarding the need and how to limit the amount of time he spends in a seated position throughout the day.   ? Personal Factors and Comorbidities Comorbidity 3+;Fitness;Time since onset of injury/illness/exacerbation   ? Comorbidities anxiety, depression, cardiac/pacemaker, decreased endurance   ? Examination-Activity Limitations Squat;Stairs;Stand;Transfers;Lift;Locomotion Level;Hygiene/Grooming;Caring for Others   ? Examination-Participation Restrictions [employer];Shop;Cleaning;Community Activity;Meal Prep   ? Stability/Clinical Decision Making Stable/Uncomplicated   ? Rehab Potential Good   ? PT Frequency 2x / week   ? PT Treatment/Interventions ADLs/Self Care Home Management;Aquatic Therapy;Electrical Stimulation;Cryotherapy;Moist Heat;Traction;Ultrasound;Parrafin;Fluidtherapy;Contrast Bath;DME Instruction;Gait training;Stair training;Functional mobility training;Therapeutic activities;Therapeutic exercise;Balance training;Neuromuscular re-education;Patient/family education;Orthotic Fit/Training;Manual techniques;Compression bandaging;Scar mobilization;Dry needling;Energy conservation;Splinting;Taping;Spinal Manipulations;Joint Manipulations   ? PT Next Visit Plan progress gait and balance training, progress endurance   ? PT Home Exercise Plan hip abduction, seated march; 1/11: STS, LAQ 2/1 good posture with shoulder flexion in seated   ? Consulted and Agree with Plan of Care Patient   ? ?  ?  ? ?  ? ? ?Patient  will benefit from skilled therapeutic intervention in order to improve the following deficits and impairments:  Abnormal gait, Decreased endurance, Decreased activity tolerance, Decreased balance, Improper body mechanics, Decreased strength, Difficulty walking, Decreased mobility ? ?Visit Diagnosis: ?Other abnormalities of gait and mobility ? ?Muscle weakness (generalized) ? ? ? ? ?Problem List ?Patient Active Problem List  ? Diagnosis Date Noted  ? Thyroid nodule 06/14/2021  ?  COVID 12/09/2020  ? Nocturia 03/08/2020  ? Neck pain, chronic 08/02/2018  ? Pacemaker 08/02/2018  ? Shoulder pain, right 07/30/2018  ? Labile blood pressure 06/04/2018  ? Sinus node dysfunction ([place]) 03/24/2018  ? Fatigue 02/01/2018  ? Mild persistent asthma 12/06/2016  ? DOE (dyspnea on exertion) 07/02/2016  ? Snoring 02/20/2016  ? Carpal tunnel syndrome of left wrist 09/26/2015  ? Insomnia secondary to anxiety 06/19/2015  ? Sinus bradycardia, chronic 11/14/2014  ? Hyperpigmented skin lesion 11/14/2014  ? Vitamin D deficiency 02/15/2014  ? Anemia 01/25/2012  ? Depression, major, single episode, severe ([place]) 12/13/2010  ? Wheezing 10/12/2010  ? Allergic rhinitis 05/19/2010  ? SMOKELESS TOBACCO ABUSE 10/26/2009  ? Hearing loss 12/23/2007  ? OSTEOPENIA 12/23/2007  ? Hyperlipidemia LDL goal <100 09/08/2007  ? Obesity (BMI 30.0-34.9) 09/08/2007  ? ? ?Adalberto Cole, PT ?06/14/2021, 2:27 PM ? ?[place] [facility] ?[address] ?[place], [place], [address] ?Phone: [phone]   Fax:  [phone] ? ?Name: Terry Love ?MRN: [id number] ?Date of Birth: 1935-10-02 ? ? ? ?

## 2021-06-14 NOTE — Patient Instructions (Signed)
F/U in early SEPTEMEBR, CALL IF YOU NEED ME SOONER, FLU VACCINE AT VISIT ? ?'Laceration is healing well, keep clean and dry, do not scratch, keep open to air as much as possible ? ?I recommend removing the bedside table that you accidentally  hit your head on ? ?You are referred for ultrasound of your thyroid gland, we will call with appointment information ? ?Thanks for choosing [facility], we consider it a privelige to serve you. ? ? ? ?

## 2021-06-16 ENCOUNTER — Other Ambulatory Visit: Payer: Self-pay

## 2021-06-16 ENCOUNTER — Ambulatory Visit (INDEPENDENT_AMBULATORY_CARE_PROVIDER_SITE_OTHER): Payer: Medicare Other | Admitting: Cardiology

## 2021-06-16 ENCOUNTER — Encounter: Payer: Self-pay | Admitting: Cardiology

## 2021-06-16 VITALS — BP 116/76 | HR 70 | Ht 70.0 in | Wt 218.0 lb

## 2021-06-16 DIAGNOSIS — R001 Bradycardia, unspecified: Secondary | ICD-10-CM

## 2021-06-16 DIAGNOSIS — E782 Mixed hyperlipidemia: Secondary | ICD-10-CM

## 2021-06-16 DIAGNOSIS — R0609 Other forms of dyspnea: Secondary | ICD-10-CM

## 2021-06-16 DIAGNOSIS — I495 Sick sinus syndrome: Secondary | ICD-10-CM

## 2021-06-16 NOTE — Progress Notes (Signed)
? ? ? ?Clinical Summary ?Mr. Kurek is a 86 y.o.male seen today for follow up of the following medical problems.  ?  ?  ?1. Symptomatic bradycardia ?- pacemaker followed by Dr Lovena Le ?  ?03/2021 normal device check.  ?- no recent symptoms.  ?  ?2. Hyperlipidemia ?- 10/2020 TC145 TG 55 HDL 53 LDL 80 ?- compliant with meds ? ?  ?3. SOB/DOE ?- chronic DOE, overall unchanged ?- extensive cardiac and pulmonary workup over the years as reported below overall benign ?  ?- chronic SOB mildly worst from baseline.  ?  ? ?Past Medical History:  ?Diagnosis Date  ? Anxiety   ? Asthma   ? Bradycardia   ? Chronic   ? Colon polyps   ? Depression   ? Facial tic   ? Glaucoma   ? Hearing loss   ? Heart disease   ? High cholesterol   ? Hyperlipidemia   ? Insomnia secondary to depression with anxiety 03/2015  ? Obesity   ? Seasonal allergies   ? ? ? ?No Known Allergies ? ? ?Current Outpatient Medications  ?Medication Sig Dispense Refill  ? albuterol (VENTOLIN HFA) 108 (90 Base) MCG/ACT inhaler INHALE 2 PUFFS INTO THE LUNGS EVERY 4 (FOUR) HOURS AS NEEDED FOR WHEEZING OR SHORTNESS OF BREATH 18 g 11  ? aspirin 81 MG EC tablet Take 81 mg by mouth at bedtime.    ? atorvastatin (LIPITOR) 40 MG tablet Take 1 tablet (40 mg total) by mouth at bedtime. 90 tablet 3  ? azelastine (ASTELIN) 0.1 % nasal spray Place 2 sprays into both nostrils 2 (two) times daily. Use in each nostril as directed 30 mL 12  ? benzonatate (TESSALON) 100 MG capsule Take 1 capsule (100 mg total) by mouth 2 (two) times daily as needed for cough. 20 capsule 4  ? budesonide-formoterol (SYMBICORT) 160-4.5 MCG/ACT inhaler Inhale 2 puffs into the lungs in the morning and at bedtime. 10.2 g 3  ? calcium-vitamin D (OSCAL WITH D) 500-200 MG-UNIT tablet Take 1 tablet by mouth 2 (two) times daily. 150 tablet 1  ? carboxymethylcellulose (REFRESH PLUS) 0.5 % SOLN INSTILL 1 DROP IN [facility] EYE FOUR TIMES A DAY    ? dorzolamide (TRUSOPT) 2 % ophthalmic solution 1 drop 2 (two) times daily.     ? dorzolamide-timolol (COSOPT) 22.3-6.8 MG/ML ophthalmic solution INSTILL 1 DROP IN [facility] EYE TWO TIMES A DAY    ? famotidine (PEPCID) 20 MG tablet One after supper 30 tablet 11  ? furosemide (LASIX) 40 MG tablet Take 1 tablet (40 mg total) by mouth daily. 90 tablet 3  ? latanoprost (XALATAN) 0.005 % ophthalmic solution Place 1 drop into both eyes at bedtime.    ? montelukast (SINGULAIR) 10 MG tablet Take 1 tablet (10 mg total) by mouth at bedtime. 30 tablet 3  ? Multiple Vitamin (MULTIVITAMIN WITH MINERALS) TABS tablet Take 1 tablet by mouth at bedtime.    ? omeprazole (PRILOSEC) 40 MG capsule TAKE 1 CAPSULE (40 MG TOTAL) BY MOUTH DAILY. 90 capsule 1  ? sertraline (ZOLOFT) 25 MG tablet Take 25 mg by mouth daily.    ? tamsulosin (FLOMAX) 0.4 MG CAPS capsule Take 1 capsule (0.4 mg total) by mouth daily. 90 capsule 3  ? venlafaxine XR (EFFEXOR-XR) 150 MG 24 hr capsule Take 1 capsule by mouth every morning.    ? vitamin C (ASCORBIC ACID) 500 MG tablet Take 500 mg by mouth at bedtime.     ? Vitamin D,  Ergocalciferol, (DRISDOL) 1.25 MG (50000 UNIT) CAPS capsule TAKE 1 CAPSULE (50,000 UNITS TOTAL) BY MOUTH EVERY MONDAY. IN THE MORNING 12 capsule 1  ? Wheat Dextrin (BENEFIBER DRINK MIX PO) Take 1 Dose by mouth daily as needed (for regularity). Use as directed daily    ? ?No current facility-administered medications for this visit.  ? ? ? ?Past Surgical History:  ?Procedure Laterality Date  ? carpal tunnel  release right  2006  ? COLONOSCOPY    ? COLONOSCOPY  01/25/2011  ? Dr. Rehman:Examination performed to cecum Pan colonic diverticulosis/2 small polyps ablated via cold biopsy from transverse colon/External hemorrhoids, benign polyps  ? COLONOSCOPY N/A 06/29/2013  ? Procedure: COLONOSCOPY;  Surgeon: Daneil Dolin, MD;  [facility];  Service: Endoscopy;  Laterality: N/A;  10:45  ? EYE SURGERY    ? left eye cataracts   ? PACEMAKER IMPLANT N/A 03/24/2018  ? Procedure: PACEMAKER IMPLANT;  Surgeon: Evans Lance, MD;  Location: [place] [facility];  Service: Cardiovascular;  Laterality: N/A;  ? ? ? ?No Known Allergies ? ? ? ?Family History  ?Problem Relation Age of Onset  ? Colon cancer Mother   ?     diagnosed at age 41  ? Aneurysm Father   ? Aneurysm Brother   ? Stroke Brother   ? ? ? ?Social History ?Mr. Raffel reports that he has never smoked. He quit smokeless tobacco use about 5 years ago.  His smokeless tobacco use included chew. ?Mr. Kosinski reports no history of alcohol use. ? ? ?Review of Systems ?CONSTITUTIONAL: No weight loss, fever, chills, weakness or fatigue.  ?HEENT: Eyes: No visual loss, blurred vision, double vision or yellow sclerae.No hearing loss, sneezing, congestion, runny nose or sore throat.  ?SKIN: No rash or itching.  ?CARDIOVASCULAR: per hpi ?RESPIRATORY: per hpi ?GASTROINTESTINAL: No anorexia, nausea, vomiting or diarrhea. No abdominal pain or blood.  ?GENITOURINARY: No burning on urination, no polyuria ?NEUROLOGICAL: No headache, dizziness, syncope, paralysis, ataxia, numbness or tingling in the extremities. No change in bowel or bladder control.  ?MUSCULOSKELETAL: No muscle, back pain, joint pain or stiffness.  ?LYMPHATICS: No enlarged nodes. No history of splenectomy.  ?PSYCHIATRIC: No history of depression or anxiety.  ?ENDOCRINOLOGIC: No reports of sweating, cold or heat intolerance. No polyuria or polydipsia.  ?. ? ? ?Physical Examination ?Today's Vitals  ? 06/16/21 1339  ?BP: 116/76  ?Pulse: 70  ?SpO2: 98%  ?Weight: 218 lb (98.9 kg)  ?Height: '5\' 10"'$  (1.778 m)  ? ?Body mass index is 31.28 kg/m?. ? ?Gen: resting comfortably, no acute distress ?HEENT: no scleral icterus, pupils equal round and reactive, no palptable cervical adenopathy,  ?CV: RRR, no m/r/g no jvd ?Resp: Clear to auscultation bilaterally ?GI: abdomen is soft, non-tender, non-distended, normal bowel sounds, no hepatosplenomegaly ?MSK: extremities are warm, no edema.  ?Skin: warm, no rash ?Neuro:  no focal deficits ?Psych:  appropriate affect ? ? ?Diagnostic Studies ? ?02/2018 echo ?Study Conclusions  ? ?- Left ventricle: The cavity size was normal. Wall thickness was  ?  increased increased in a pattern of mild to moderate LVH.  ?  Systolic function was normal. The estimated ejection fraction was  ?  in the range of 60% to 65%. Wall motion was normal; there were no  ?  regional wall motion abnormalities. Doppler parameters are  ?  consistent with abnormal left ventricular relaxation (grade 1  ?  diastolic dysfunction).  ?- Aortic valve: Moderately calcified annulus. Trileaflet; mildly  ?  calcified leaflets. There was mild regurgitation.  ?- Mitral valve: Mildly calcified annulus. There was trivial  ?  regurgitation.  ?- Right atrium: Central venous pressure (est): 3 mm Hg.  ?- Atrial septum: No defect or patent foramen ovale was identified.  ?- Tricuspid valve: There was trivial regurgitation.  ?- Pulmonary arteries: Systolic pressure could not be accurately  ?  estimated.  ?- Pericardium, extracardiac: There was no pericardial effusion.  ?  ?  ?02/2018 GXT ?Nondiagnostic exercise treadmill test for ischemia due to blunted heart rate response, however consistent with chronotropic incompetence with patient achieving only 57% of MPHR. Test discontinued due to patient fatigue and shortness of breath. ?Blood pressure demonstrated a normal response to exercise. ?  ?  ?  ?03/2016 nuclear stress ?There was no ST segment deviation noted during stress. ?The study is normal. No ischemia or scar. ?This is a low risk study. ?Nuclear stress EF: 70%. ?  ?  ?08/2016 PFTs ?Minimal diffusion defect, no obstruction  ? ? ?Assessment and Plan  ?1. Pacemaker/symptomatic bradycardia/sinus bradycardia ?-no recent symptoms, normal recent device check ?- continue to monitor ?  ?2. Hyperlipidemia ?Has been at goal, continue current meds ?  ?3. DOE ?- extensive workup over the last few years fairly benign ?- likely significant component of deconditioning,  recommedned more regular exercise ?- if marked change in symptoms over time could repeat some of his cardiac studies.  ? ? ? ? ? ?Arnoldo Lenis, M.D. ?

## 2021-06-16 NOTE — Patient Instructions (Signed)
Follow-Up: °Follow up with Dr. Branch in 6 months ° °Any Other Special Instructions Will Be Listed Below (If Applicable). ° ° ° ° °If you need a refill on your cardiac medications before your next appointment, please call your pharmacy. ° °

## 2021-06-17 DIAGNOSIS — S0181XA Laceration without foreign body of other part of head, initial encounter: Secondary | ICD-10-CM | POA: Insufficient documentation

## 2021-06-17 DIAGNOSIS — Z09 Encounter for follow-up examination after completed treatment for conditions other than malignant neoplasm: Secondary | ICD-10-CM | POA: Insufficient documentation

## 2021-06-17 NOTE — Assessment & Plan Note (Signed)
Healing well by intent, wound site shows no sign of infection. Topical antibiotic oitment applied to skin after glue removed ?

## 2021-06-17 NOTE — Assessment & Plan Note (Signed)
Patient in for follow up of recent Ed visit ?Discharge summary, and laboratory and radiology data are reviewed, and any questions or concerns  are discussed. ?Specific issues requiring follow up are specifically addressed.Steri strips and glue removed without trauma, wound still healing by inention ? ?

## 2021-06-17 NOTE — Progress Notes (Signed)
? ?  Terry Love     MRN: [id number]      DOB: 25-Dec-1935 ? ? ?HPI ?Terry Love is here for follow up of ED visit on 06/01/2021 when he presented with a fall, while getting out of bed , accidentally  hitting the left cheek near eye on a night stand. Here today to have stiches removed. The laceration was glued . No redness or drainage from the laceration, still needs to continue to heal by intention, steri strips removed, laceration approx 1 cm. No fever , no pain ?ED radiologic data reviewed with pt and spouse, all questions answered ?ROS ?Denies recent fever or chills. ?Denies sinus pressure, nasal congestion, ear pain or sore throat. ?Denies chest congestion, productive cough or wheezing. ?Denies chest pains, palpitations and leg swelling ? ? ?PE ? ?BP 116/69   Pulse 68   Ht '5\' 10"'$  (1.778 m)   Wt 219 lb (99.3 kg)   SpO2 94%   BMI 31.42 kg/m?  ? ?Patient alert and oriented and in no cardiopulmonary distress. ? ?HEENT: No facial asymmetry, EOMI,     Neck supple .1 cm clean laceration , still not closed on lateral aspect left eye, no drainage , erythema or tenderness, steri strips and glue removed  ? ?Chest: Clear to auscultation bilaterally. ? ?CVS: S1, S2 no murmurs, no S3.Regular rate. ? ?ABD: Soft non tender.  ? ?Ext: No edema ? ?. ? ?CNS: CN 2-12 intact, power,  normal throughout.no focal deficits noted. ? ? ?Assessment & Plan ? ?Encounter for examination following treatment at hospital ?Patient in for follow up of recent Ed visit ?Discharge summary, and laboratory and radiology data are reviewed, and any questions or concerns  are discussed. ?Specific issues requiring follow up are specifically addressed.Steri strips and glue removed without trauma, wound still healing by inention ? ? ?Laceration of face ?Healing well by intent, wound site shows no sign of infection. Topical antibiotic oitment applied to skin after glue removed ? ?Thyroid nodule ?Abnormality seen on imaging study in the ED, nwed dedicated  US to further eval, ? ? ?

## 2021-06-17 NOTE — Assessment & Plan Note (Signed)
Abnormality seen on imaging study in the ED, nwed dedicated US to further eval, ?

## 2021-06-19 ENCOUNTER — Other Ambulatory Visit: Payer: Self-pay | Admitting: Family Medicine

## 2021-06-19 ENCOUNTER — Other Ambulatory Visit (HOSPITAL_COMMUNITY): Payer: Self-pay | Admitting: Family Medicine

## 2021-06-19 DIAGNOSIS — D649 Anemia, unspecified: Secondary | ICD-10-CM

## 2021-06-20 ENCOUNTER — Other Ambulatory Visit: Payer: Self-pay

## 2021-06-20 ENCOUNTER — Ambulatory Visit (HOSPITAL_COMMUNITY): Payer: Medicare Other | Admitting: Physical Therapy

## 2021-06-20 ENCOUNTER — Encounter (HOSPITAL_COMMUNITY): Payer: Self-pay | Admitting: Physical Therapy

## 2021-06-20 DIAGNOSIS — R2689 Other abnormalities of gait and mobility: Secondary | ICD-10-CM

## 2021-06-20 DIAGNOSIS — M6281 Muscle weakness (generalized): Secondary | ICD-10-CM

## 2021-06-20 NOTE — Therapy (Signed)
?OUTPATIENT PHYSICAL THERAPY TREATMENT NOTE ? ? ?Patient Name: Terry Love ?MRN: [id number] ?DOB:[birth year], 86 y.o., male ?Today's Date: 06/20/2021 ? ?PCP: Fayrene Helper, MD ?REFERRING PROVIDER: Ronnie Derby MD ? ? PT End of Session - 06/20/21 1404   ? ? Visit Number [id number]   ? Number of Visits 20   ? Date for PT Re-Evaluation 06/26/21   ? Authorization Type Primary Medicare Secondary BCBS supplemental   ? Progress Note Due on Visit 20   ? PT Start Time 4627   ? PT Stop Time 0350   ? PT Time Calculation (min) 38 min   ? Activity Tolerance Patient tolerated treatment well;Patient limited by fatigue   ? Behavior During Therapy [facility] for tasks assessed/performed   ? ?  ?  ? ?  ? ? ?Past Medical History:  ?Diagnosis Date  ? Anxiety   ? Asthma   ? Bradycardia   ? Chronic   ? Colon polyps   ? Depression   ? Facial tic   ? Glaucoma   ? Hearing loss   ? Heart disease   ? High cholesterol   ? Hyperlipidemia   ? Insomnia secondary to depression with anxiety 03/2015  ? Obesity   ? Seasonal allergies   ? ?Past Surgical History:  ?Procedure Laterality Date  ? carpal tunnel  release right  2006  ? COLONOSCOPY    ? COLONOSCOPY  01/25/2011  ? Dr. Rehman:Examination performed to cecum Pan colonic diverticulosis/2 small polyps ablated via cold biopsy from transverse colon/External hemorrhoids, benign polyps  ? COLONOSCOPY N/A 06/29/2013  ? Procedure: COLONOSCOPY;  Surgeon: Daneil Dolin, MD;  [facility];  Service: Endoscopy;  Laterality: N/A;  10:45  ? EYE SURGERY    ? left eye cataracts   ? PACEMAKER IMPLANT N/A 03/24/2018  ? Procedure: PACEMAKER IMPLANT;  Surgeon: Evans Lance, MD;  Location: [place] [facility];  Service: Cardiovascular;  Laterality: N/A;  ? ?Patient Active Problem List  ? Diagnosis Date Noted  ? Encounter for examination following treatment at hospital 06/17/2021  ? Laceration of face 06/17/2021  ? Thyroid nodule 06/14/2021  ? COVID 12/09/2020  ? Nocturia 03/08/2020  ? Neck pain, chronic  08/02/2018  ? Pacemaker 08/02/2018  ? Shoulder pain, right 07/30/2018  ? Labile blood pressure 06/04/2018  ? Sinus node dysfunction ([place]) 03/24/2018  ? Fatigue 02/01/2018  ? Mild persistent asthma 12/06/2016  ? DOE (dyspnea on exertion) 07/02/2016  ? Snoring 02/20/2016  ? Carpal tunnel syndrome of left wrist 09/26/2015  ? Insomnia secondary to anxiety 06/19/2015  ? Sinus bradycardia, chronic 11/14/2014  ? Hyperpigmented skin lesion 11/14/2014  ? Vitamin D deficiency 02/15/2014  ? Anemia 01/25/2012  ? Depression, major, single episode, severe ([place]) 12/13/2010  ? Wheezing 10/12/2010  ? Allergic rhinitis 05/19/2010  ? SMOKELESS TOBACCO ABUSE 10/26/2009  ? Hearing loss 12/23/2007  ? OSTEOPENIA 12/23/2007  ? Hyperlipidemia LDL goal <100 09/08/2007  ? Obesity (BMI 30.0-34.9) 09/08/2007  ? ? ?REFERRING DIAG: Ambulatory Dysfunction ? ?THERAPY DIAG:  ?Other abnormalities of gait and mobility ? ?Muscle weakness (generalized) ? ?PERTINENT HISTORY: pacemaker ? ?PRECAUTIONS: Fall ? ?SUBJECTIVE: Has been doing alright, no new falls.  ? ?PAIN:  ?Are you having pain? No ? ? ? ? ?TODAY'S TREATMENT:  ?06/20/21 ?Nu step level 4 75-85 SPM, seat 11, 6 minutes ?Step taps 6 inch step 2x 10 bilateral with bilateral HHA ?Lateral step up 6 inch 2x 10 bilateral  ? ? ?PATIENT EDUCATION: ?Education details:  HEP ?Person educated: Patient ?Education method: Explanation and Demonstration ?Education comprehension: verbalized understanding and returned demonstration ? ? ?HOME EXERCISE PROGRAM: ?hip abduction, seated march; 1/11: STS, LAQ 2/1 good posture with shoulder flexion in seated  ? ? PT Short Term Goals   ? ?  ? PT SHORT TERM GOAL #1  ? Title Patient will be independent with HEP in order to improve functional outcomes.   ? Time 3   ? Period Weeks   ? Status On-going   ? Target Date 05/08/21   ?  ? PT SHORT TERM GOAL #2  ? Title Patient will report at least 25% improvement in symptoms for improved quality of life.   ? Time 3   ? Period Weeks    ? Status Achieved   ? Target Date 05/08/21   ? ?  ?  ? ?  ? ? ? PT Long Term Goals   ? ?  ? PT LONG TERM GOAL #1  ? Title Patient will report at least 75% improvement in symptoms for improved quality of life.   ? Time 6   ? Period Weeks   ? Status Achieved   ? Target Date 05/29/21   ?  ? PT LONG TERM GOAL #2  ? Title Patient will be able to ambulate at least 300 feet in 2MWT in order to demonstrate improved gait speed for community ambulation.   ? Time 6   ? Period Weeks   ? Status On-going   ? Target Date 05/29/21   ?  ? PT LONG TERM GOAL #3  ? Title Patient will be able to complete 5x STS in under 15 seconds in order to reduce the risk of falls.   ? Time 6   ? Period Weeks   ? Status On-going   ? Target Date 05/29/21   ? ?  ?  ? ?  ? ? ? Plan   ? ? Clinical Impression Statement Patient ambulating with increased trunk flexion and unsteadiness compared to prior sessions. Patient requiring longer/more frequent rest breaks today due to fatigue. He requires intermittent cueing for posture and reducing UE support. Patient will continue to benefit from physical therapy to reduce impairment and improve function.   ? Personal Factors and Comorbidities Comorbidity 3+;Fitness;Time since onset of injury/illness/exacerbation   ? Comorbidities anxiety, depression, cardiac/pacemaker, decreased endurance   ? Examination-Activity Limitations Squat;Stairs;Stand;Transfers;Lift;Locomotion Level;Hygiene/Grooming;Caring for Others   ? Examination-Participation Restrictions [employer];Shop;Cleaning;Community Activity;Meal Prep   ? Stability/Clinical Decision Making Stable/Uncomplicated   ? Rehab Potential Good   ? PT Frequency 2x / week   ? PT Treatment/Interventions ADLs/Self Care Home Management;Aquatic Therapy;Electrical Stimulation;Cryotherapy;Moist Heat;Traction;Ultrasound;Parrafin;Fluidtherapy;Contrast Bath;DME Instruction;Gait training;Stair training;Functional mobility training;Therapeutic activities;Therapeutic  exercise;Balance training;Neuromuscular re-education;Patient/family education;Orthotic Fit/Training;Manual techniques;Compression bandaging;Scar mobilization;Dry needling;Energy conservation;Splinting;Taping;Spinal Manipulations;Joint Manipulations   ? PT Next Visit Plan progress gait and balance training, progress endurance   ? PT Home Exercise Plan hip abduction, seated march; 1/11: STS, LAQ 2/1 good posture with shoulder flexion in seated   ? Consulted and Agree with Plan of Care Patient   ? ?  ?  ? ?  ? ? ? ?2:39 PM, 06/20/21 ?Mearl Latin PT, DPT ?Physical Therapist at [facility] ?[facility] ? ? ?   ?

## 2021-06-21 ENCOUNTER — Ambulatory Visit (INDEPENDENT_AMBULATORY_CARE_PROVIDER_SITE_OTHER): Payer: Medicare Other | Admitting: Internal Medicine

## 2021-06-21 ENCOUNTER — Other Ambulatory Visit: Payer: Self-pay

## 2021-06-21 ENCOUNTER — Encounter: Payer: Self-pay | Admitting: Internal Medicine

## 2021-06-21 ENCOUNTER — Ambulatory Visit: Payer: Medicare Other | Admitting: Family Medicine

## 2021-06-21 DIAGNOSIS — R0609 Other forms of dyspnea: Secondary | ICD-10-CM | POA: Diagnosis not present

## 2021-06-21 NOTE — Progress Notes (Signed)
? ?Terry Love, male    DOB: 1936/02/17, 86 y.o.   MRN: [id number] ? ? ?Brief patient profile:  ?72 yobm  never smoker/retired in cig factory referred to pulmonary clinic in [place]  05/09/2021 by Dr Vaughan Browner for asthma f/u - onset was  2012  ? ?W/u as of 05/09/2021 1st [place]  ? ?PFTs 09/05/16 ?FVC 2.90 (84%), FEV1 2.32 (92%), F/F 80, TLC 82%, DLCO 79% ?Minimal reduction in diffusion capacity. ? ?FENO 09/05/16- 54 ? ?Labs: ?CBC with diff 09/05/16- WBC 8.2, 5.4% eos, absolute eso count 460 ?Blood allergy screen  09/05/16- negative RAST, IgE 150 ? ?Cardiac: ?Echo 03/05/2018 ?LVH, EF 60 to 31%, grade 1 diastolic dysfunction  ? ? ? ? ?History of Present Illness  ?05/09/2021  Pulmonary/ 1st office eval/ Melvyn Novas / [facility] re asthma maint on symbicort 160 with poor techique  ?Chief Complaint  ?Patient presents with  ? New Patient (Initial Visit)  ?  Has seen Dr. Vaughan Browner in the past is switching to [place] office for location.  ? ?Concerns about breathing  ? ?Using symbicort 2 puffs in am and 2 puffs in pm. Wants to discuss different inhaler costs.   ?Dyspnea:  walking about 50 ft and stop x sev years  ?Cough: not a problem  ?Sleep: bed is flat/ on side s am flare but sometimes wheeze per wife  ?SABA use:  several times a week but never prechallenges  ?Rec ?Continue omeprazole 40 mg Take 30-60 min before first meal of the day  ?Add pepcid 20 mg after supper  ?Work on inhaler technique:   ?Ok to try albuterol 15 min before an activity (on alternating days)  that you know would usually make you short of breath ?GERD diet reviewed, bed blocks rec  ?Please schedule a follow up office visit in 6 weeks, call sooner if needed with all medications /inhalers/ solutions in hand  ? ? ?06/21/2021  f/u ov/ office/Eero Dini re: doe/ ? asthma maint on symb 160 2bid hfa much better   ?Chief Complaint  ?Patient presents with  ? Follow-up  ?  Breathing has improved but patient still feels like he cannot get a full deep breath.  Using symbicort inhaler.   ?Dyspnea:  storage building is 75 ft flat  morning  ?Cough: none  ?Sleeping: sleeping propped helps  ?SABA use: once a day  ?02: none  ?Covid status: up to date x for bivalent ?  ? ? ?No obvious day to day or daytime variability or assoc excess/ purulent sputum or mucus plugs or hemoptysis or cp or chest tightness, subjective wheeze or overt sinus or hb symptoms.  ? ?Sleeping  without nocturnal  or early am exacerbation  of respiratory  c/o's or need for noct saba. Also denies any obvious fluctuation of symptoms with weather or environmental changes or other aggravating or alleviating factors except as outlined above  ? ?No unusual exposure hx or h/o childhood pna/ asthma or knowledge of premature birth. ? ?Current Allergies, Complete Past Medical History, Past Surgical History, Family History, and Social History were reviewed in [employer] record. ? ?ROS  The following are not active complaints unless bolded ?Hoarseness, sore throat, dysphagia, dental problems, itching, sneezing,  nasal congestion or discharge of excess mucus or purulent secretions, ear ache,   fever, chills, sweats, unintended wt loss or wt gain, classically pleuritic or exertional cp,  orthopnea pnd or arm/hand swelling  or leg swelling, presyncope, palpitations, abdominal pain, anorexia, nausea, vomiting, diarrhea  or change in bowel habits or change in bladder habits, change in stools or change in urine, dysuria, hematuria,  rash, arthralgias, visual complaints, headache, numbness, weakness or ataxia or problems with walking or coordination,  change in mood or  memory. ?      ? ?Current Meds  ?Medication Sig  ? albuterol (VENTOLIN HFA) 108 (90 Base) MCG/ACT inhaler INHALE 2 PUFFS INTO THE LUNGS EVERY 4 (FOUR) HOURS AS NEEDED FOR WHEEZING OR SHORTNESS OF BREATH  ? aspirin 81 MG EC tablet Take 81 mg by mouth at bedtime.  ? atorvastatin (LIPITOR) 40 MG tablet Take 1 tablet (40 mg total) by mouth  at bedtime.  ? benzonatate (TESSALON) 100 MG capsule Take 1 capsule (100 mg total) by mouth 2 (two) times daily as needed for cough.  ? budesonide-formoterol (SYMBICORT) 160-4.5 MCG/ACT inhaler Inhale 2 puffs into the lungs in the morning and at bedtime.  ? calcium-vitamin D (OSCAL WITH D) 500-200 MG-UNIT tablet Take 1 tablet by mouth 2 (two) times daily.  ? carboxymethylcellulose (REFRESH PLUS) 0.5 % SOLN INSTILL 1 DROP IN [facility] EYE FOUR TIMES A DAY  ? dorzolamide-timolol (COSOPT) 22.3-6.8 MG/ML ophthalmic solution INSTILL 1 DROP IN [facility] EYE TWO TIMES A DAY  ? famotidine (PEPCID) 20 MG tablet One after supper  ? furosemide (LASIX) 40 MG tablet Take 1 tablet (40 mg total) by mouth daily.  ? latanoprost (XALATAN) 0.005 % ophthalmic solution Place 1 drop into both eyes at bedtime.  ? montelukast (SINGULAIR) 10 MG tablet Take 1 tablet (10 mg total) by mouth at bedtime.  ? Multiple Vitamin (MULTIVITAMIN WITH MINERALS) TABS tablet Take 1 tablet by mouth at bedtime.  ? omeprazole (PRILOSEC) 40 MG capsule TAKE 1 CAPSULE (40 MG TOTAL) BY MOUTH DAILY.  ? sertraline (ZOLOFT) 25 MG tablet Take 25 mg by mouth daily.  ? tamsulosin (FLOMAX) 0.4 MG CAPS capsule Take 1 capsule (0.4 mg total) by mouth daily.  ? venlafaxine XR (EFFEXOR-XR) 150 MG 24 hr capsule Take 1 capsule by mouth every morning.  ? vitamin C (ASCORBIC ACID) 500 MG tablet Take 500 mg by mouth at bedtime.   ? Vitamin D, Ergocalciferol, (DRISDOL) 1.25 MG (50000 UNIT) CAPS capsule TAKE 1 CAPSULE (50,000 UNITS TOTAL) BY MOUTH EVERY MONDAY. IN THE MORNING  ?     ? ?  ?   ? ?Past Medical History:  ?Diagnosis Date  ? Anxiety   ? Asthma   ? Bradycardia   ? Chronic   ? Colon polyps   ? Depression   ? Facial tic   ? Glaucoma   ? Hearing loss   ? Heart disease   ? High cholesterol   ? Hyperlipidemia   ? Insomnia secondary to depression with anxiety 03/2015  ? Obesity   ? Seasonal allergies   ? ?  ? ? ? ?Objective:  ?  ? ?Wt Readings from Last 3 Encounters:  ?06/21/21 217 lb  6.4 oz (98.6 kg)  ?06/16/21 218 lb (98.9 kg)  ?06/14/21 219 lb (99.3 kg)  ?  ? ? ?Vital signs reviewed  06/21/2021  - Note at rest 02 sats  99% on RA  ? ?General appearance:    amb bm nad / mild tremor    ? ? HEENT : pt wearing mask not removed for exam due to covid -19 concerns.  ? ? ?NECK :  without JVD/Nodes/TM/ nl carotid upstrokes bilaterally ? ? ?LUNGS: no acc muscle use,  Nl contour chest which is clear to  A and P bilaterally without cough on insp or exp maneuvers ? ? ?CV:  RRR  no s3 or murmur or increase in P2, and no edema  ? ?ABD:  obese/ soft and nontender with nl inspiratory excursion in the supine position. No bruits or organomegaly appreciated, bowel sounds nl ? ?MS:  Nl gait/ ext warm without deformities, calf tenderness, cyanosis or clubbing ?No obvious joint restrictions  ? ?SKIN: warm and dry without lesions   ? ?NEURO:  alert, approp, nl sensorium with  no motor or cerebellar deficits apparent.  ? ? ? ? ?I personally reviewed images and agree with radiology impression as follows:  ?CXR:   02/23/2021  ?No active cardiopulmonary disease. ?  ? ?   ?Assessment  ? ?  ?  ?   ?

## 2021-06-21 NOTE — Assessment & Plan Note (Addendum)
Onset ? Around 2012 at wt = 246  ?-  PFTs 09/05/16 ?FVC 2.90 (84%), FEV1 2.32 (92%), F/F 80, TLC 82%, DLCO 79% ?Minimal reduction in diffusion capacity. ?- 05/09/2021   Walked on Ra  x  one  lap(s) =  approx 150  ft  @ slow pace, stopped due to fatigue and sob with lowest 02 sats 97%  ?- 06/21/2021  After extensive coaching inhaler device,  effectiveness =    80%  ?- 06/21/2021   Walked on RA  x  3  lap(s) =  approx 450  ft  @ fast pace, stopped due to end of study min sob with lowest 02 sats 96%  ? ?All goals of chronic asthma control met including optimal function and elimination of symptoms with minimal need for rescue therapy. ? ?Contingencies discussed in full including contacting this office immediately if not controlling the symptoms using the rule of two's.    ? ?F/u  q 6 m,sooner prn ? ?Each maintenance medication was reviewed in detail including emphasizing most importantly the difference between maintenance and prns and under what circumstances the prns are to be triggered using an action plan format where appropriate. ? ?Total time for H and P, chart review, counseling, reviewing hfa device(s) , directly observing portions of ambulatory 02 saturation study/ and generating customized AVS unique to this office visit / same day charting  39 min ?     ?  ?      ?

## 2021-06-21 NOTE — Patient Instructions (Addendum)
No change in medications. ? ?Work on inhaler technique:  relax and gently blow all the way out then take a nice smooth full deep breath back in, triggering the inhaler at same time you start breathing in.  Hold for up to 5 seconds if you can. Blow symbicort  out thru nose. Rinse and gargle with water when done.  If mouth or throat bother you at all,  try brushing teeth/gums/tongue with arm and hammer toothpaste/ make a slurry and gargle and spit out.  ? ?Please schedule a follow up visit in 6 months but call sooner if needed  ? ?   ?

## 2021-06-22 ENCOUNTER — Encounter (HOSPITAL_COMMUNITY): Payer: Self-pay | Admitting: Physical Therapy

## 2021-06-22 ENCOUNTER — Ambulatory Visit (HOSPITAL_COMMUNITY): Payer: Medicare Other | Admitting: Physical Therapy

## 2021-06-22 DIAGNOSIS — M6281 Muscle weakness (generalized): Secondary | ICD-10-CM | POA: Diagnosis not present

## 2021-06-22 DIAGNOSIS — R2689 Other abnormalities of gait and mobility: Secondary | ICD-10-CM | POA: Diagnosis not present

## 2021-06-22 NOTE — Therapy (Signed)
?OUTPATIENT PHYSICAL THERAPY TREATMENT NOTE ? ? ?Patient Name: Terry Love ?MRN: [id number] ?DOB:1935/05/01, 86 y.o., male ?Today's Date: 06/22/2021 ? ?PCP: Fayrene Helper, MD ?REFERRING PROVIDER: Ronnie Derby MD ? ? PT End of Session - 06/22/21 1359   ? ? Visit Number 15   ? Number of Visits 20   ? Date for PT Re-Evaluation 06/26/21   ? Authorization Type Primary Medicare Secondary BCBS supplemental   ? Progress Note Due on Visit 20   ? PT Start Time 1355   ? PT Stop Time 1435   ? PT Time Calculation (min) 40 min   ? Activity Tolerance Patient tolerated treatment well;Patient limited by fatigue   ? Behavior During Therapy [facility] for tasks assessed/performed   ? ?  ?  ? ?  ? ? ?Past Medical History:  ?Diagnosis Date  ? Anxiety   ? Asthma   ? Bradycardia   ? Chronic   ? Colon polyps   ? Depression   ? Facial tic   ? Glaucoma   ? Hearing loss   ? Heart disease   ? High cholesterol   ? Hyperlipidemia   ? Insomnia secondary to depression with anxiety 03/2015  ? Obesity   ? Seasonal allergies   ? ?Past Surgical History:  ?Procedure Laterality Date  ? carpal tunnel  release right  2006  ? COLONOSCOPY    ? COLONOSCOPY  01/25/2011  ? Dr. Rehman:Examination performed to cecum Pan colonic diverticulosis/2 small polyps ablated via cold biopsy from transverse colon/External hemorrhoids, benign polyps  ? COLONOSCOPY N/A 06/29/2013  ? Procedure: COLONOSCOPY;  Surgeon: Daneil Dolin, MD;  [facility];  Service: Endoscopy;  Laterality: N/A;  10:45  ? EYE SURGERY    ? left eye cataracts   ? PACEMAKER IMPLANT N/A 03/24/2018  ? Procedure: PACEMAKER IMPLANT;  Surgeon: Evans Lance, MD;  Location: [place] [facility];  Service: Cardiovascular;  Laterality: N/A;  ? ?Patient Active Problem List  ? Diagnosis Date Noted  ? Encounter for examination following treatment at hospital 06/17/2021  ? Laceration of face 06/17/2021  ? Thyroid nodule 06/14/2021  ? COVID 12/09/2020  ? Nocturia 03/08/2020  ? Neck pain, chronic  08/02/2018  ? Pacemaker 08/02/2018  ? Shoulder pain, right 07/30/2018  ? Labile blood pressure 06/04/2018  ? Sinus node dysfunction ([place]) 03/24/2018  ? Fatigue 02/01/2018  ? Mild persistent asthma 12/06/2016  ? DOE (dyspnea on exertion) 07/02/2016  ? Snoring 02/20/2016  ? Carpal tunnel syndrome of left wrist 09/26/2015  ? Insomnia secondary to anxiety 06/19/2015  ? Sinus bradycardia, chronic 11/14/2014  ? Hyperpigmented skin lesion 11/14/2014  ? Vitamin D deficiency 02/15/2014  ? Anemia 01/25/2012  ? Depression, major, single episode, severe ([place]) 12/13/2010  ? Wheezing 10/12/2010  ? Allergic rhinitis 05/19/2010  ? SMOKELESS TOBACCO ABUSE 10/26/2009  ? Hearing loss 12/23/2007  ? OSTEOPENIA 12/23/2007  ? Hyperlipidemia LDL goal <100 09/08/2007  ? Obesity (BMI 30.0-34.9) 09/08/2007  ? ? ?REFERRING DIAG: Ambulatory Dysfunction ? ?THERAPY DIAG:  ?Other abnormalities of gait and mobility ? ?Muscle weakness (generalized) ? ?PERTINENT HISTORY: pacemaker ? ?PRECAUTIONS: Fall ? ?SUBJECTIVE: Been laying low. Trying to exercise to build endurance up at home. Feels alright.  ? ?PAIN:  ?Are you having pain? No ? ? ? ? ?TODAY'S TREATMENT:  ?06/22/21 ?Bike seat 15 level 3, 6 minutes, warm up and endurance with cueing for RPM > 40 - 45 ?STS 3x 5 with UE flexion after transfer to standing ?  Standing marches 2x 20 bilateral  ?Seated marches 2x 10 bilateral during rest breaks ?Seated Row 2x 15 blue band ? ? ?06/20/21 ?Nu step level 4 75-85 SPM, seat 11, 6 minutes ?Step taps 6 inch step 2x 10 bilateral with bilateral HHA ?Lateral step up 6 inch 2x 10 bilateral  ? ? ?PATIENT EDUCATION: ?Education details: HEP ?Person educated: Patient ?Education method: Explanation and Demonstration ?Education comprehension: verbalized understanding and returned demonstration ? ? ?HOME EXERCISE PROGRAM: ?hip abduction, seated march; 1/11: STS, LAQ 2/1 good posture with shoulder flexion in seated  ? ? PT Short Term Goals   ? ?  ? PT SHORT TERM GOAL #1  ?  Title Patient will be independent with HEP in order to improve functional outcomes.   ? Time 3   ? Period Weeks   ? Status On-going   ? Target Date 05/08/21   ?  ? PT SHORT TERM GOAL #2  ? Title Patient will report at least 25% improvement in symptoms for improved quality of life.   ? Time 3   ? Period Weeks   ? Status Achieved   ? Target Date 05/08/21   ? ?  ?  ? ?  ? ? ? PT Long Term Goals   ? ?  ? PT LONG TERM GOAL #1  ? Title Patient will report at least 75% improvement in symptoms for improved quality of life.   ? Time 6   ? Period Weeks   ? Status Achieved   ? Target Date 05/29/21   ?  ? PT LONG TERM GOAL #2  ? Title Patient will be able to ambulate at least 300 feet in 2MWT in order to demonstrate improved gait speed for community ambulation.   ? Time 6   ? Period Weeks   ? Status On-going   ? Target Date 05/29/21   ?  ? PT LONG TERM GOAL #3  ? Title Patient will be able to complete 5x STS in under 15 seconds in order to reduce the risk of falls.   ? Time 6   ? Period Weeks   ? Status On-going   ? Target Date 05/29/21   ? ?  ?  ? ?  ? ? ? Plan   ? ? Clinical Impression Statement Patient again appearing more fatigued today as he did last session with more labored mobility and unsteadiness. Patient requiring CGA with STS and standing exercises secondary to unsteadiness. Intermittent rest breaks for fatigue. Patient will continue to benefit from physical therapy to reduce impairment and improve function.   ? Personal Factors and Comorbidities Comorbidity 3+;Fitness;Time since onset of injury/illness/exacerbation   ? Comorbidities anxiety, depression, cardiac/pacemaker, decreased endurance   ? Examination-Activity Limitations Squat;Stairs;Stand;Transfers;Lift;Locomotion Level;Hygiene/Grooming;Caring for Others   ? Examination-Participation Restrictions [employer];Shop;Cleaning;Community Activity;Meal Prep   ? Stability/Clinical Decision Making Stable/Uncomplicated   ? Rehab Potential Good   ? PT  Frequency 2x / week   ? PT Treatment/Interventions ADLs/Self Care Home Management;Aquatic Therapy;Electrical Stimulation;Cryotherapy;Moist Heat;Traction;Ultrasound;Parrafin;Fluidtherapy;Contrast Bath;DME Instruction;Gait training;Stair training;Functional mobility training;Therapeutic activities;Therapeutic exercise;Balance training;Neuromuscular re-education;Patient/family education;Orthotic Fit/Training;Manual techniques;Compression bandaging;Scar mobilization;Dry needling;Energy conservation;Splinting;Taping;Spinal Manipulations;Joint Manipulations   ? PT Next Visit Plan progress gait and balance training, progress endurance   ? PT Home Exercise Plan hip abduction, seated march; 1/11: STS, LAQ 2/1 good posture with shoulder flexion in seated   ? Consulted and Agree with Plan of Care Patient   ? ?  ?  ? ?  ? ? ? ?2:32 PM, 06/22/21 ?Vianne Bulls.  Matyas Baisley PT, DPT ?Physical Therapist at [facility] ?[facility] ? ? ?   ?

## 2021-06-23 ENCOUNTER — Other Ambulatory Visit: Payer: Self-pay

## 2021-06-23 ENCOUNTER — Ambulatory Visit (HOSPITAL_COMMUNITY)
Admission: RE | Admit: 2021-06-23 | Discharge: 2021-06-23 | Disposition: A | Discharge status: Home / Self Care | Payer: Medicare Other | Source: Ambulatory Visit | Attending: Family Medicine | Admitting: Family Medicine

## 2021-06-23 DIAGNOSIS — E041 Nontoxic single thyroid nodule: Secondary | ICD-10-CM | POA: Insufficient documentation

## 2021-06-26 ENCOUNTER — Other Ambulatory Visit: Payer: Self-pay

## 2021-06-26 ENCOUNTER — Encounter (HOSPITAL_COMMUNITY): Payer: Self-pay

## 2021-06-26 ENCOUNTER — Other Ambulatory Visit: Payer: Self-pay | Admitting: Family Medicine

## 2021-06-26 ENCOUNTER — Ambulatory Visit (HOSPITAL_COMMUNITY): Payer: Medicare Other

## 2021-06-26 DIAGNOSIS — E785 Hyperlipidemia, unspecified: Secondary | ICD-10-CM

## 2021-06-26 DIAGNOSIS — E559 Vitamin D deficiency, unspecified: Secondary | ICD-10-CM

## 2021-06-26 DIAGNOSIS — Z125 Encounter for screening for malignant neoplasm of prostate: Secondary | ICD-10-CM

## 2021-06-26 DIAGNOSIS — R2689 Other abnormalities of gait and mobility: Secondary | ICD-10-CM

## 2021-06-26 DIAGNOSIS — M6281 Muscle weakness (generalized): Secondary | ICD-10-CM

## 2021-06-26 DIAGNOSIS — E041 Nontoxic single thyroid nodule: Secondary | ICD-10-CM

## 2021-06-26 DIAGNOSIS — D649 Anemia, unspecified: Secondary | ICD-10-CM

## 2021-06-26 DIAGNOSIS — E669 Obesity, unspecified: Secondary | ICD-10-CM

## 2021-06-26 NOTE — Therapy (Signed)
?OUTPATIENT PHYSICAL THERAPY TREATMENT NOTE and Discharge ? ? ? ?PHYSICAL THERAPY DISCHARGE SUMMARY ? ?Visits from Start of Care: 16 ? ?Current functional level related to goals / functional outcomes: ?Independent - modified due to pace ?  ?Remaining deficits: ?Minimal, B ankle weakness, endurance fatigue and SOB, however met goals ?  ?Education / Equipment: ?HEP  ? ?Patient agrees to discharge. Patient goals were met. Patient is being discharged due to meeting the stated rehab goals.  ? ?Patient Name: Terry Love ?MRN: [id number] ?DOB:[birth year], 86 y.o., male ?Today's Date: 06/26/2021 ? ?PCP: Fayrene Helper, MD ?REFERRING PROVIDER: Ronnie Derby MD ? ? PT End of Session - 06/26/21 1349   ? ? Visit Number 16   ? Number of Visits 20   ? Date for PT Re-Evaluation 06/26/21   ? Authorization Type Primary Medicare Secondary BCBS supplemental   ? Progress Note Due on Visit 20   ? PT Start Time 1345   ? PT Stop Time 1425   ? PT Time Calculation (min) 40 min   ? Activity Tolerance Patient tolerated treatment well;Patient limited by fatigue   ? Behavior During Therapy [facility] for tasks assessed/performed   ? ?  ?  ? ?  ? ? ? ?Past Medical History:  ?Diagnosis Date  ? Anxiety   ? Asthma   ? Bradycardia   ? Chronic   ? Colon polyps   ? Depression   ? Facial tic   ? Glaucoma   ? Hearing loss   ? Heart disease   ? High cholesterol   ? Hyperlipidemia   ? Insomnia secondary to depression with anxiety 03/2015  ? Obesity   ? Seasonal allergies   ? ?Past Surgical History:  ?Procedure Laterality Date  ? carpal tunnel  release right  2006  ? COLONOSCOPY    ? COLONOSCOPY  01/25/2011  ? Dr. Rehman:Examination performed to cecum Pan colonic diverticulosis/2 small polyps ablated via cold biopsy from transverse colon/External hemorrhoids, benign polyps  ? COLONOSCOPY N/A 06/29/2013  ? Procedure: COLONOSCOPY;  Surgeon: Daneil Dolin, MD;  [facility];  Service: Endoscopy;  Laterality: N/A;  10:45  ? EYE SURGERY    ? left  eye cataracts   ? PACEMAKER IMPLANT N/A 03/24/2018  ? Procedure: PACEMAKER IMPLANT;  Surgeon: Evans Lance, MD;  Location: [place] [facility];  Service: Cardiovascular;  Laterality: N/A;  ? ?Patient Active Problem List  ? Diagnosis Date Noted  ? Encounter for examination following treatment at hospital 06/17/2021  ? Laceration of face 06/17/2021  ? Thyroid nodule 06/14/2021  ? COVID 12/09/2020  ? Nocturia 03/08/2020  ? Neck pain, chronic 08/02/2018  ? Pacemaker 08/02/2018  ? Shoulder pain, right 07/30/2018  ? Labile blood pressure 06/04/2018  ? Sinus node dysfunction ([place]) 03/24/2018  ? Fatigue 02/01/2018  ? Mild persistent asthma 12/06/2016  ? DOE (dyspnea on exertion) 07/02/2016  ? Snoring 02/20/2016  ? Carpal tunnel syndrome of left wrist 09/26/2015  ? Insomnia secondary to anxiety 06/19/2015  ? Sinus bradycardia, chronic 11/14/2014  ? Hyperpigmented skin lesion 11/14/2014  ? Vitamin D deficiency 02/15/2014  ? Anemia 01/25/2012  ? Depression, major, single episode, severe ([place]) 12/13/2010  ? Wheezing 10/12/2010  ? Allergic rhinitis 05/19/2010  ? SMOKELESS TOBACCO ABUSE 10/26/2009  ? Hearing loss 12/23/2007  ? OSTEOPENIA 12/23/2007  ? Hyperlipidemia LDL goal <100 09/08/2007  ? Obesity (BMI 30.0-34.9) 09/08/2007  ? ? ?REFERRING DIAG: Ambulatory Dysfunction ? ?THERAPY DIAG:  ?Other abnormalities of gait  and mobility ? ?Muscle weakness (generalized) ? ?PERTINENT HISTORY: pacemaker ? ?PRECAUTIONS: Fall ? ?SUBJECTIVE: Continues to feel okay except his shortness of breath. Overall from the physical therapy he feels 75% better, just "winded" during activities.  ? ?PAIN:  ?Are you having pain? No ? ? ? ? ?TODAY'S TREATMENT:  ?06/26/21 ? Conversation for discharge today verse continue during warm up  ? Nustep seat 11, level 3, 5 min warmup ?Re-assessment - Gross B LE   5/5 except B ankle 4-/5 ? Gait 2MWT - able to complete 300 feet at even pace and no rest  ? 5 x STS  = time of 19.87 seconds today, no B UE support able  to do with good form ? Therepeutic exercises -  ?  Standing march 2 x 10 ?  Standing hip abduction 2 x 10  ? HEP review - LAQ, seated hip marching ? ? ?06/22/21 ?Bike seat 15 level 3, 6 minutes, warm up and endurance with cueing for RPM > 40 - 45 ?STS 3x 5 with UE flexion after transfer to standing ?Standing marches 2x 20 bilateral  ?Seated marches 2x 10 bilateral during rest breaks ?Seated Row 2x 15 blue band ? ? ?06/20/21 ?Nu step level 4 75-85 SPM, seat 11, 6 minutes ?Step taps 6 inch step 2x 10 bilateral with bilateral HHA ?Lateral step up 6 inch 2x 10 bilateral  ? ? ?PATIENT EDUCATION: ?Education details: Discussion for discharge verse progress note to continue via Review of prior goals and comparing to evaluation; HEP  ?Person educated: Patient ?Education method: Explanation and Demonstration ?Education comprehension: verbalized understanding and returned demonstration ? ? ?HOME EXERCISE PROGRAM: ?hip abduction, seated march; 1/11: STS, LAQ 2/1 good posture with shoulder flexion in seated  ? ? PT Short Term Goals   ? ?  ? PT SHORT TERM GOAL #1  ? Title Patient will be independent with HEP in order to improve functional outcomes.   ? Time 3   ? Period Weeks   ? Status On-going   ? Target Date 05/08/21   ?  ? PT SHORT TERM GOAL #2  ? Title Patient will report at least 25% improvement in symptoms for improved quality of life.   ? Time 3   ? Period Weeks   ? Status Achieved   ? Target Date 05/08/21   ? ?  ?  ? ?  ? ? ? PT Long Term Goals   ? ?  ? PT LONG TERM GOAL #1  ? Title Patient will report at least 75% improvement in symptoms for improved quality of life.   ? Time 6   ? Period Weeks   ? Status Achieved , again met on 3/20 per patient report  ? Target Date 05/29/21   ?  ? PT LONG TERM GOAL #2  ? Title Patient will be able to ambulate at least 300 feet in 2MWT in order to demonstrate improved gait speed for community ambulation.   ? Time 6   ? Period Weeks   ? Status Met on 06/26/21  ? Target Date 05/29/21   ?  ?  PT LONG TERM GOAL #3  ? Title Patient will be able to complete 5x STS in under 15 seconds in order to reduce the risk of falls.   ? Time 6   ? Period Weeks   ? Status Nearly met - 20 seconds on 06/26/21  ? Target Date 05/29/21   ? ?  ?  ? ?  ? ? ?  Plan   ? ? Clinical Impression Statement Today's session was utilized for progress assessment and to continue with functional strengthening.  During activities, he was able to show good progress as he met goals with only limiting factor of shortness of breath and fatigue at end of activities. These concerns could be from age or other co-morbidities.   At this time, with those goals met, it is recommended that he discharge from physical therapy and continue with home exercise focus for continued needs.    ? Personal Factors and Comorbidities Comorbidity 3+;Fitness;Time since onset of injury/illness/exacerbation   ? Comorbidities anxiety, depression, cardiac/pacemaker, decreased endurance   ? Examination-Activity Limitations Squat;Stairs;Stand;Transfers;Lift;Locomotion Level;Hygiene/Grooming;Caring for Others   ? Examination-Participation Restrictions [employer];Shop;Cleaning;Community Activity;Meal Prep   ? Stability/Clinical Decision Making Stable/Uncomplicated   ? Rehab Potential Good   ? PT Frequency 2x / week   ? PT Treatment/Interventions ADLs/Self Care Home Management;Aquatic Therapy;Electrical Stimulation;Cryotherapy;Moist Heat;Traction;Ultrasound;Parrafin;Fluidtherapy;Contrast Bath;DME Instruction;Gait training;Stair training;Functional mobility training;Therapeutic activities;Therapeutic exercise;Balance training;Neuromuscular re-education;Patient/family education;Orthotic Fit/Training;Manual techniques;Compression bandaging;Scar mobilization;Dry needling;Energy conservation;Splinting;Taping;Spinal Manipulations;Joint Manipulations   ? PT Next Visit Plan progress gait and balance training, progress endurance   ? PT Home Exercise Plan hip abduction, seated  march; 1/11: STS, LAQ 2/1 good posture with shoulder flexion in seated   ? Consulted and Agree with Plan of Care Patient   ? ?  ?  ? ?  ? ? ? ? ?2:18 PM, 06/26/21 ? ?Margarette Asal Carlis Abbott, PT, DPT  ?Contrac

## 2021-06-26 NOTE — Progress Notes (Signed)
Amb radiology for FNA thyroid nodule ? ?

## 2021-06-29 ENCOUNTER — Encounter (HOSPITAL_COMMUNITY): Payer: Medicare Other | Admitting: Physical Therapy

## 2021-07-04 ENCOUNTER — Ambulatory Visit (HOSPITAL_COMMUNITY)
Admission: RE | Admit: 2021-07-04 | Discharge: 2021-07-04 | Disposition: A | Discharge status: Home / Self Care | Payer: Medicare Other | Source: Ambulatory Visit | Attending: Family Medicine | Admitting: Family Medicine

## 2021-07-04 ENCOUNTER — Other Ambulatory Visit: Payer: Self-pay

## 2021-07-04 DIAGNOSIS — D649 Anemia, unspecified: Secondary | ICD-10-CM | POA: Insufficient documentation

## 2021-07-04 DIAGNOSIS — N281 Cyst of kidney, acquired: Secondary | ICD-10-CM | POA: Diagnosis not present

## 2021-07-04 DIAGNOSIS — K760 Fatty (change of) liver, not elsewhere classified: Secondary | ICD-10-CM | POA: Diagnosis not present

## 2021-07-05 ENCOUNTER — Ambulatory Visit (INDEPENDENT_AMBULATORY_CARE_PROVIDER_SITE_OTHER): Payer: Medicare Other

## 2021-07-05 DIAGNOSIS — I495 Sick sinus syndrome: Secondary | ICD-10-CM

## 2021-07-05 DIAGNOSIS — Z125 Encounter for screening for malignant neoplasm of prostate: Secondary | ICD-10-CM | POA: Diagnosis not present

## 2021-07-05 DIAGNOSIS — E785 Hyperlipidemia, unspecified: Secondary | ICD-10-CM | POA: Diagnosis not present

## 2021-07-05 DIAGNOSIS — D649 Anemia, unspecified: Secondary | ICD-10-CM | POA: Diagnosis not present

## 2021-07-05 DIAGNOSIS — E559 Vitamin D deficiency, unspecified: Secondary | ICD-10-CM | POA: Diagnosis not present

## 2021-07-05 LAB — CUP PACEART REMOTE DEVICE CHECK
Date Time Interrogation Session: 20230328091616
Implantable Lead Implant Date: 20191216
Implantable Lead Implant Date: 20191216
Implantable Lead Location: 753859
Implantable Lead Location: 753860
Implantable Lead Model: 377
Implantable Lead Model: 377
Implantable Lead Serial Number: 80840322
Implantable Lead Serial Number: 80924555
Implantable Pulse Generator Implant Date: 20191216
Pulse Gen Model: 407145
Pulse Gen Serial Number: 69486427

## 2021-07-06 LAB — CMP14+EGFR
ALT: 12 IU/L (ref 0–44)
AST: 22 IU/L (ref 0–40)
Albumin/Globulin Ratio: 1.7 (ref 1.2–2.2)
Albumin: 4.3 g/dL (ref 3.6–4.6)
Alkaline Phosphatase: 69 IU/L (ref 44–121)
BUN/Creatinine Ratio: 14 (ref 10–24)
BUN: 11 mg/dL (ref 8–27)
Bilirubin Total: 0.5 mg/dL (ref 0.0–1.2)
CO2: 25 mmol/L (ref 20–29)
Calcium: 9.1 mg/dL (ref 8.6–10.2)
Chloride: 105 mmol/L (ref 96–106)
Creatinine, Ser: 0.81 mg/dL (ref 0.76–1.27)
Globulin, Total: 2.6 g/dL (ref 1.5–4.5)
Glucose: 106 mg/dL — ABNORMAL HIGH (ref 70–99)
Potassium: 4.4 mmol/L (ref 3.5–5.2)
Sodium: 140 mmol/L (ref 134–144)
Total Protein: 6.9 g/dL (ref 6.0–8.5)
eGFR: 87 mL/min/{1.73_m2} (ref >59)

## 2021-07-06 LAB — LIPID PANEL
Chol/HDL Ratio: 2.7 ratio (ref 0.0–5.0)
Cholesterol, Total: 147 mg/dL (ref 100–199)
HDL: 55 mg/dL (ref >39)
LDL Chol Calc (NIH): 78 mg/dL (ref 0–99)
Triglycerides: 69 mg/dL (ref 0–149)
VLDL Cholesterol Cal: 14 mg/dL (ref 5–40)

## 2021-07-06 LAB — PSA: Prostate Specific Ag, Serum: <0.1 ng/mL (ref 0.0–4.0)

## 2021-07-06 LAB — CBC
Hematocrit: 38.7 % (ref 37.5–51.0)
Hemoglobin: 12.7 g/dL — ABNORMAL LOW (ref 13.0–17.7)
MCH: 30.3 pg (ref 26.6–33.0)
MCHC: 32.8 g/dL (ref 31.5–35.7)
MCV: 92 fL (ref 79–97)
Platelets: 285 10*3/uL (ref 150–450)
RBC: 4.19 x10E6/uL (ref 4.14–5.80)
RDW: 11.4 % — ABNORMAL LOW (ref 11.6–15.4)
WBC: 5.7 10*3/uL (ref 3.4–10.8)

## 2021-07-06 LAB — VITAMIN D 25 HYDROXY (VIT D DEFICIENCY, FRACTURES): Vit D, 25-Hydroxy: 64 ng/mL (ref 30.0–100.0)

## 2021-07-06 LAB — TSH: TSH: 1.2 u[IU]/mL (ref 0.450–4.500)

## 2021-07-09 ENCOUNTER — Other Ambulatory Visit: Payer: Self-pay | Admitting: Family Medicine

## 2021-07-10 ENCOUNTER — Telehealth: Payer: Self-pay

## 2021-07-10 ENCOUNTER — Other Ambulatory Visit: Payer: Self-pay

## 2021-07-10 NOTE — Telephone Encounter (Signed)
Please call with test results

## 2021-07-10 NOTE — Telephone Encounter (Signed)
Please call this patient- they said they are returning your call ?

## 2021-07-10 NOTE — Telephone Encounter (Signed)
Returned pt call, spoke to wife regarding lab work, no questions or concerns.  ?

## 2021-07-11 ENCOUNTER — Other Ambulatory Visit (HOSPITAL_COMMUNITY)
Admission: RE | Admit: 2021-07-11 | Discharge: 2021-07-11 | Disposition: A | Discharge status: Home / Self Care | Payer: Medicare Other | Source: Ambulatory Visit | Attending: Physician Assistant | Admitting: Physician Assistant

## 2021-07-11 ENCOUNTER — Ambulatory Visit
Admission: RE | Admit: 2021-07-11 | Discharge: 2021-07-11 | Disposition: A | Discharge status: Home / Self Care | Payer: Medicare Other | Source: Ambulatory Visit | Attending: Family Medicine | Admitting: Family Medicine

## 2021-07-11 DIAGNOSIS — R001 Bradycardia, unspecified: Secondary | ICD-10-CM | POA: Insufficient documentation

## 2021-07-11 DIAGNOSIS — E041 Nontoxic single thyroid nodule: Secondary | ICD-10-CM | POA: Diagnosis not present

## 2021-07-11 NOTE — Telephone Encounter (Signed)
Terry Love spoke with wife regarding labs  ?

## 2021-07-12 ENCOUNTER — Telehealth: Payer: Self-pay

## 2021-07-12 LAB — CYTOLOGY - NON PAP

## 2021-07-12 NOTE — Telephone Encounter (Signed)
This was sent to [facility] 2 days ago ?

## 2021-07-12 NOTE — Telephone Encounter (Signed)
Please call in rx for Vit D ?

## 2021-07-13 DIAGNOSIS — Z20822 Contact with and (suspected) exposure to covid-19: Secondary | ICD-10-CM | POA: Diagnosis not present

## 2021-07-18 NOTE — Progress Notes (Signed)
Remote pacemaker transmission.   

## 2021-07-22 ENCOUNTER — Other Ambulatory Visit: Payer: Self-pay | Admitting: Family Medicine

## 2021-08-09 ENCOUNTER — Telehealth: Payer: Self-pay | Admitting: Family Medicine

## 2021-08-09 DIAGNOSIS — Z20822 Contact with and (suspected) exposure to covid-19: Secondary | ICD-10-CM | POA: Diagnosis not present

## 2021-08-09 NOTE — Telephone Encounter (Signed)
Patient aware of biopsy result. ?

## 2021-08-09 NOTE — Telephone Encounter (Signed)
Returning call.

## 2021-08-18 ENCOUNTER — Other Ambulatory Visit: Payer: Self-pay | Admitting: *Deleted

## 2021-08-18 MED ORDER — ATORVASTATIN CALCIUM 40 MG PO TABS: 40.0000 mg | ORAL_TABLET | Freq: Every day | ORAL | 3 refills | Status: DC

## 2021-08-23 IMAGING — US US ABDOMEN LIMITED
1 series · 14 of 25 positions shown · non-contrast
Comparison: Report from ultrasound October 21, 2001 however no
concurrent imaging available at time dictation.

CLINICAL DATA: History of NASH, follow-up

EXAM:
ULTRASOUND ABDOMEN LIMITED RIGHT UPPER QUADRANT

[Series 1: us abdomen limited · 0.22mm/px · 14 of 68 slices shown]
[im 1/68]
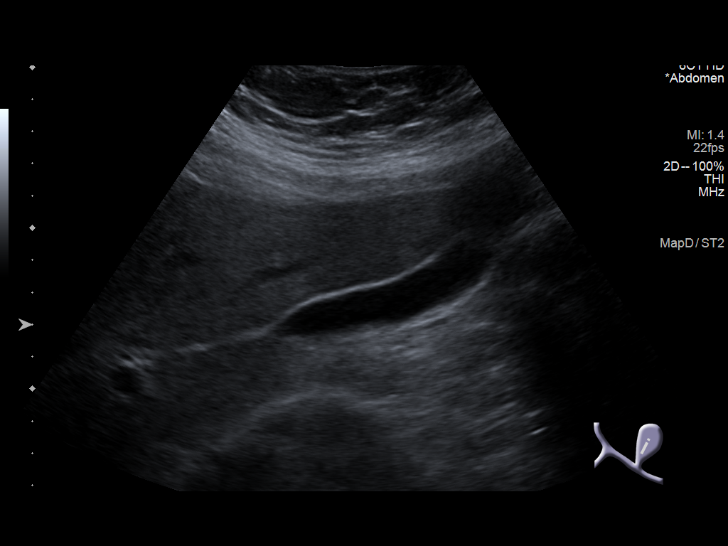
[im 6/68]
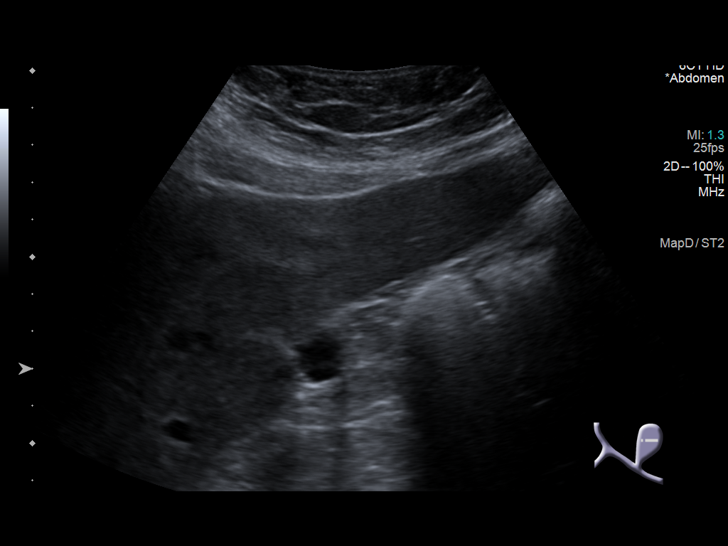
[im 12/68]
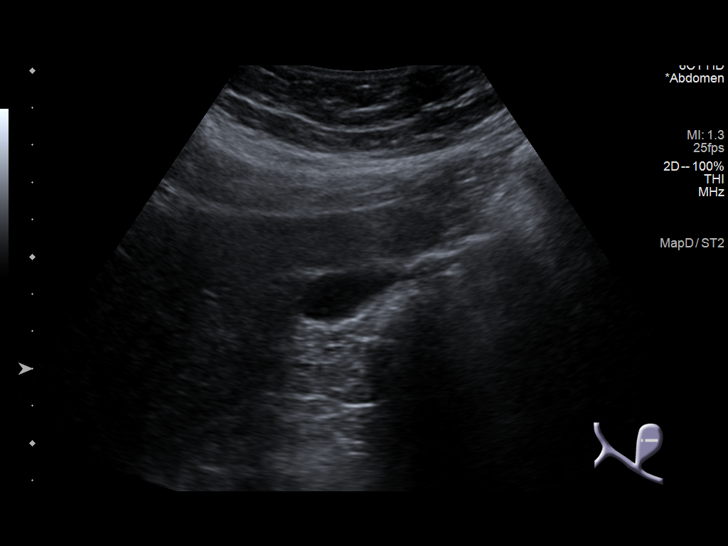
[im 17/68]
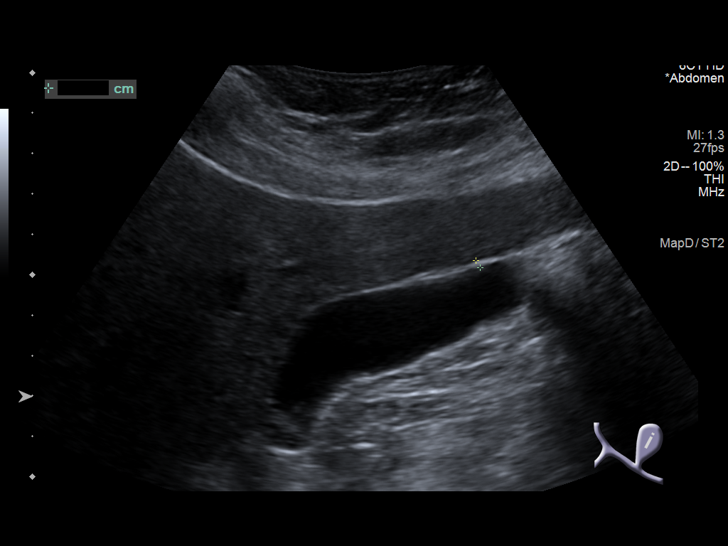
[im 23/68]
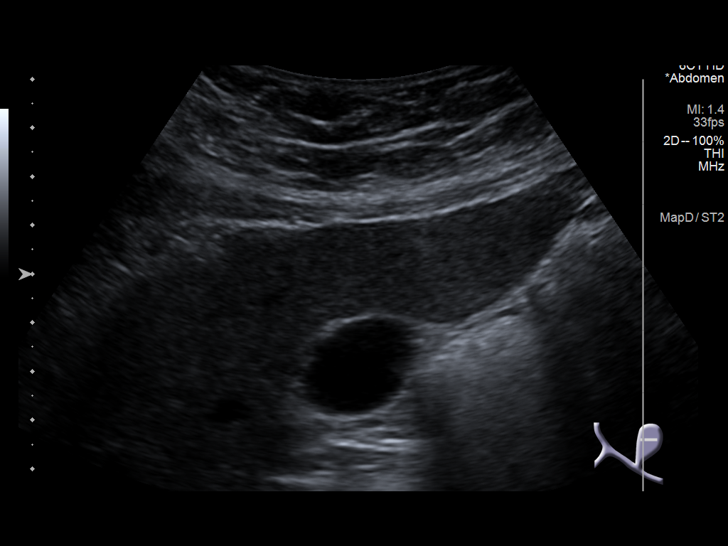
[im 26/68]
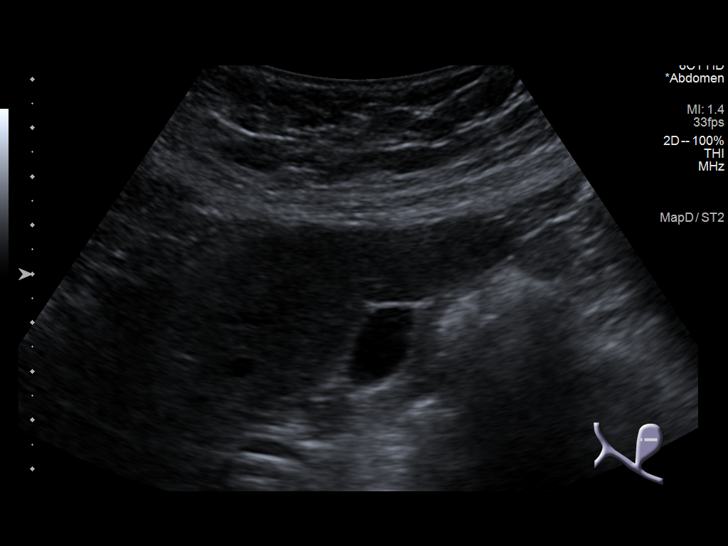
[im 31/68]
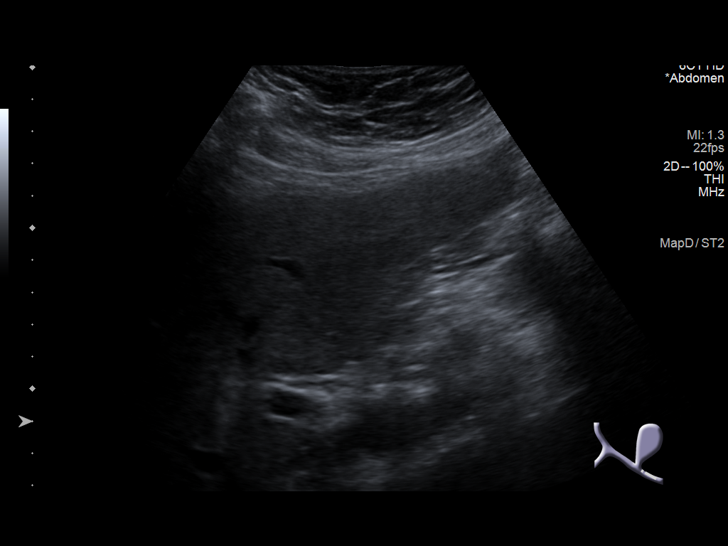
[im 37/68]
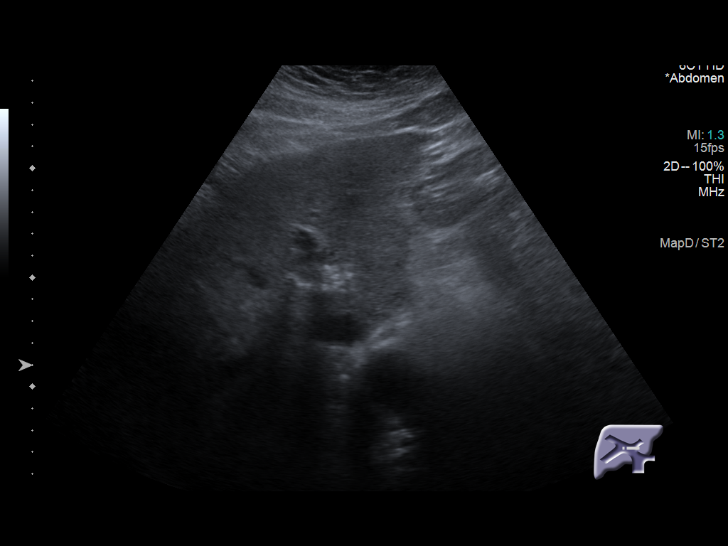
[im 42/68]
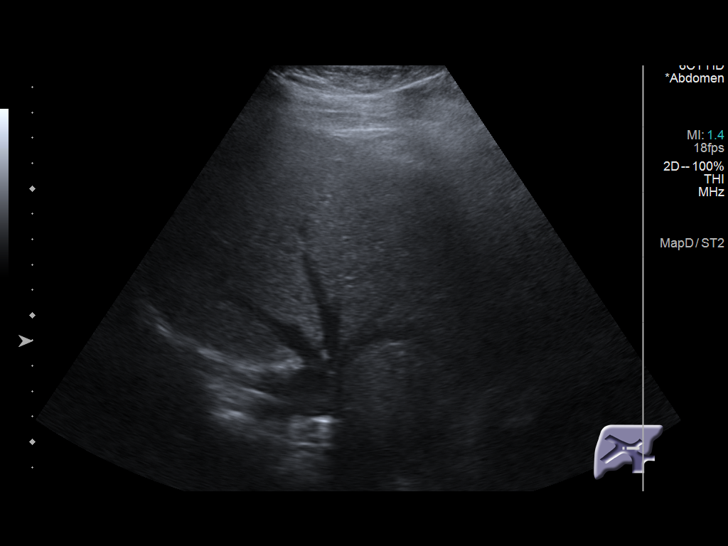
[im 45/68]
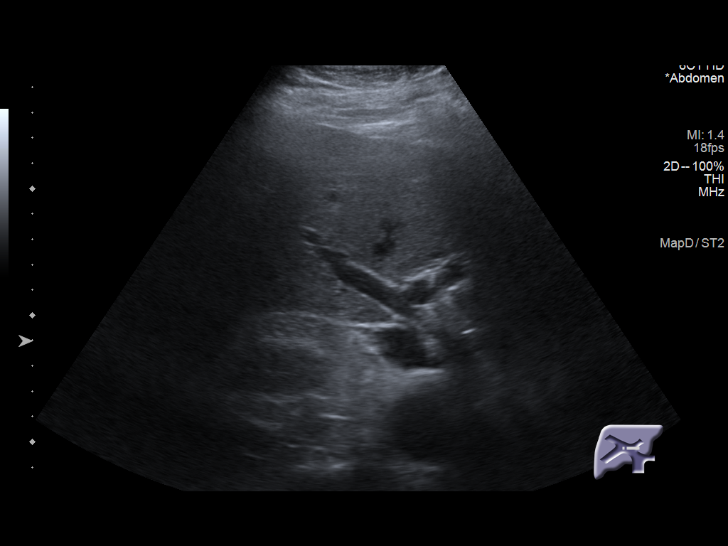
[im 51/68]
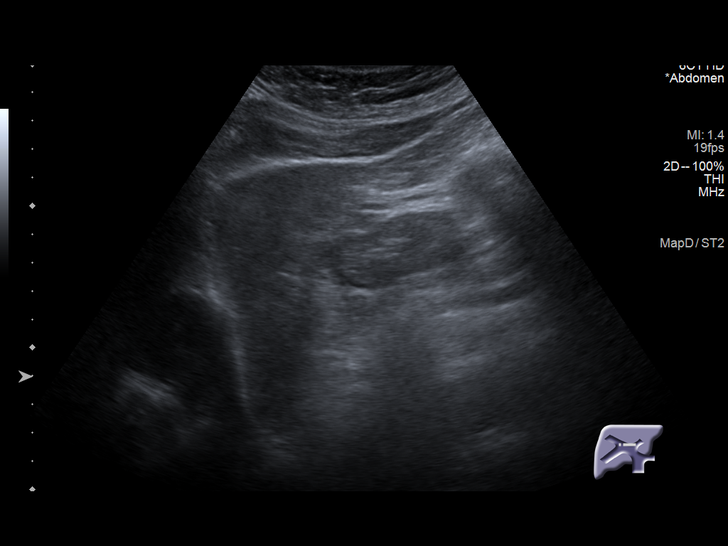
[im 56/68]
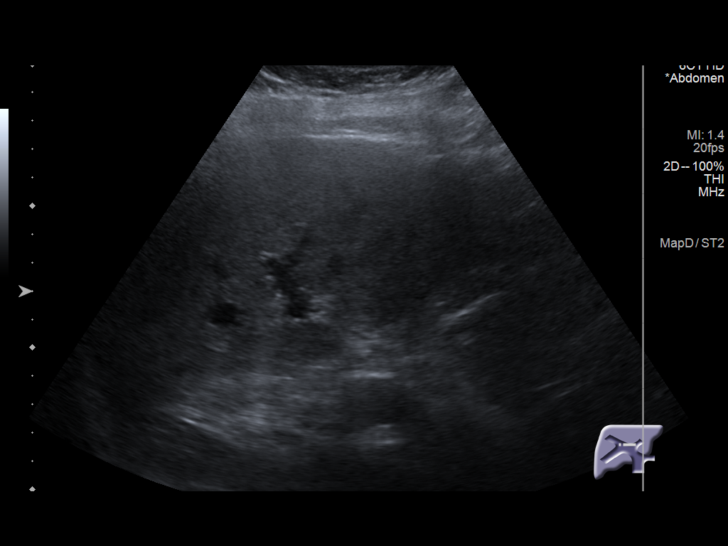
[im 62/68]
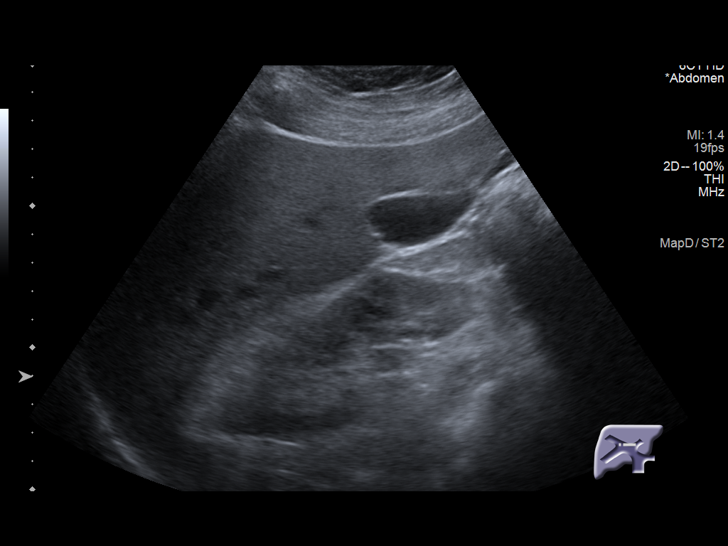
[im 68/68]
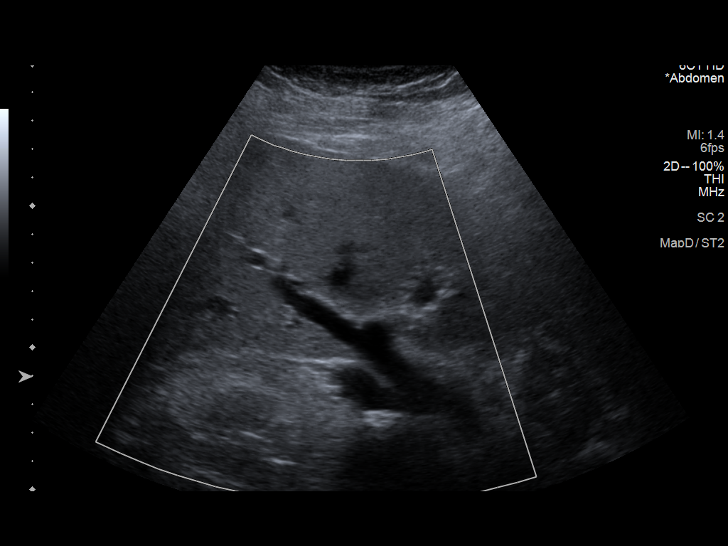

[14 of 25 positions shown; findings below may reference images not displayed]

FINDINGS: Gallbladder:

No gallstones or wall thickening visualized. No sonographic Murphy
sign noted by sonographer.

Common bile duct:

Diameter: 3 mm

Liver:

No focal lesion identified. Diffusely increased parenchymal
echogenicity. Portal vein is patent on color Doppler imaging with
normal direction of blood flow towards the liver.

Other: None.
IMPRESSION: Diffusely increased parenchymal echogenicity of the liver. This is a
nonspecific finding but is most commonly seen with fatty
infiltration of the liver. There are no obvious focal liver lesions
identified.

## 2021-08-28 ENCOUNTER — Telehealth: Payer: Self-pay | Admitting: Internal Medicine

## 2021-08-28 DIAGNOSIS — J4530 Mild persistent asthma, uncomplicated: Secondary | ICD-10-CM

## 2021-08-28 MED ORDER — ALBUTEROL SULFATE HFA 108 (90 BASE) MCG/ACT IN AERS: INHALATION_SPRAY | RESPIRATORY_TRACT | 11 refills | Status: DC

## 2021-08-28 NOTE — Telephone Encounter (Signed)
Patient was here in lobby I spoke with him and he is requesting refill on his albuterol inhaler. He verified pharmacy. RX has been sent. Nothing further needed at this time.

## 2021-09-05 ENCOUNTER — Telehealth: Payer: Self-pay | Admitting: Family Medicine

## 2021-09-05 ENCOUNTER — Other Ambulatory Visit: Payer: Self-pay

## 2021-09-05 MED ORDER — ATORVASTATIN CALCIUM 40 MG PO TABS: 40.0000 mg | ORAL_TABLET | Freq: Every day | ORAL | 3 refills | Status: DC

## 2021-09-05 NOTE — Telephone Encounter (Signed)
[facility] called stating pt is needing a refill on Atorvastatin '40mg'$ . Can you please refill? States they have faxed for this medication with no response back.   [facility]

## 2021-09-05 NOTE — Telephone Encounter (Signed)
Sent in

## 2021-09-13 ENCOUNTER — Other Ambulatory Visit: Payer: Self-pay | Admitting: Family Medicine

## 2021-09-17 ENCOUNTER — Other Ambulatory Visit: Payer: Self-pay | Admitting: Pulmonary Disease

## 2021-09-17 DIAGNOSIS — J4530 Mild persistent asthma, uncomplicated: Secondary | ICD-10-CM

## 2021-09-19 ENCOUNTER — Ambulatory Visit (INDEPENDENT_AMBULATORY_CARE_PROVIDER_SITE_OTHER): Payer: Medicare Other | Admitting: Internal Medicine

## 2021-09-19 ENCOUNTER — Encounter: Payer: Self-pay | Admitting: Internal Medicine

## 2021-09-19 VITALS — BP 130/80 | HR 70 | Ht 70.0 in | Wt 214.0 lb

## 2021-09-19 DIAGNOSIS — Z950 Presence of cardiac pacemaker: Secondary | ICD-10-CM | POA: Diagnosis not present

## 2021-09-19 DIAGNOSIS — I495 Sick sinus syndrome: Secondary | ICD-10-CM | POA: Diagnosis not present

## 2021-09-19 NOTE — Progress Notes (Signed)
HPI [name] returns today for followup. He is a pleasant 86 yo man with a h/o HTN and COPD who was found to have chronotropic incompetence and underwent PPM insertion. In the interim, he has still had some dyspnea/fatigue with exertion but still remains active. No PND or orthopnea. No edema. He admits to being sedentary. His sob is improved. He has been on only low dose lasix.  No Known Allergies   Current Outpatient Medications  Medication Sig Dispense Refill   albuterol (VENTOLIN HFA) 108 (90 Base) MCG/ACT inhaler INHALE 2 PUFFS INTO THE LUNGS EVERY 4 (FOUR) HOURS AS NEEDED FOR WHEEZING OR SHORTNESS OF BREATH 18 g 11   aspirin 81 MG EC tablet Take 81 mg by mouth at bedtime.     atorvastatin (LIPITOR) 40 MG tablet Take 1 tablet (40 mg total) by mouth at bedtime. 90 tablet 3   benzonatate (TESSALON) 100 MG capsule Take 1 capsule (100 mg total) by mouth 2 (two) times daily as needed for cough. 20 capsule 4   budesonide-formoterol (SYMBICORT) 160-4.5 MCG/ACT inhaler INHALE 2 PUFFS INTO THE LUNGS IN THE MORNING AND AT BEDTIME. 10.2 each 0   calcium-vitamin D (OSCAL WITH D) 500-200 MG-UNIT tablet Take 1 tablet by mouth 2 (two) times daily. 150 tablet 1   carboxymethylcellulose (REFRESH PLUS) 0.5 % SOLN INSTILL 1 DROP IN EACH EYE FOUR TIMES A DAY     dorzolamide-timolol (COSOPT) 22.3-6.8 MG/ML ophthalmic solution INSTILL 1 DROP IN [facility] EYE TWO TIMES A DAY     famotidine (PEPCID) 20 MG tablet One after supper 30 tablet 11   furosemide (LASIX) 40 MG tablet Take 1 tablet (40 mg total) by mouth daily. 90 tablet 3   latanoprost (XALATAN) 0.005 % ophthalmic solution Place 1 drop into both eyes at bedtime.     montelukast (SINGULAIR) 10 MG tablet TAKE 1 TABLET BY MOUTH EVERYDAY AT BEDTIME 90 tablet 1   Multiple Vitamin (MULTIVITAMIN WITH MINERALS) TABS tablet Take 1 tablet by mouth at bedtime.     omeprazole (PRILOSEC) 40 MG capsule TAKE 1 CAPSULE (40 MG TOTAL) BY MOUTH DAILY. 90 capsule 1    sertraline (ZOLOFT) 25 MG tablet Take 25 mg by mouth daily.     tamsulosin (FLOMAX) 0.4 MG CAPS capsule Take 1 capsule (0.4 mg total) by mouth daily. 90 capsule 3   venlafaxine XR (EFFEXOR-XR) 150 MG 24 hr capsule Take 1 capsule by mouth every morning.     vitamin C (ASCORBIC ACID) 500 MG tablet Take 500 mg by mouth at bedtime.      Vitamin D, Ergocalciferol, (DRISDOL) 1.25 MG (50000 UNIT) CAPS capsule TAKE 1 CAPSULE (50,000 UNITS TOTAL) BY MOUTH EVERY MONDAY. IN THE MORNING 12 capsule 1   No current facility-administered medications for this visit.     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   Past Surgical History:  Procedure Laterality Date   carpal tunnel  release right  2006   COLONOSCOPY  COLONOSCOPY  01/25/2011   Dr. Rehman:Examination performed to cecum Pan colonic diverticulosis/2 small polyps ablated via cold biopsy from transverse colon/External hemorrhoids, benign polyps   COLONOSCOPY N/A 06/29/2013   Procedure: COLONOSCOPY;  Surgeon: Daneil Dolin, MD;  [facility];  Service: Endoscopy;  Laterality: N/A;  10:45   EYE SURGERY     left eye cataracts    PACEMAKER IMPLANT N/A 03/24/2018   Procedure: PACEMAKER IMPLANT;  Surgeon: Evans Lance, MD;  Location: [facility];  Service: Cardiovascular;  Laterality: N/A;     Family History  Problem Relation Age of Onset   Colon cancer Mother        diagnosed at age 43   Aneurysm Father    Aneurysm Brother    Stroke Brother      Social History   Socioeconomic History   Marital status: Married    Spouse name: Ceasar Lund    Number of children: 0   Years of  education: 12   Highest education level: 12th grade  Occupational History   Occupation: retired  Tobacco Use   Smoking status: Never   Smokeless tobacco: Former    Types: Chew    Quit date: 03/13/2016  Vaping Use   Vaping Use: Never used  Substance and Sexual Activity   Alcohol use: No    Alcohol/week: 0.0 standard drinks of alcohol   Drug use: No   Sexual activity: Not Currently  Other Topics Concern   Not on file  Social History Narrative   Not on file   Social Determinants of Health   Financial Resource Strain: Low Risk  (02/07/2021)   Overall Financial Resource Strain (CARDIA)    Difficulty of Paying Living Expenses: Not hard at all  Food Insecurity: No Food Insecurity (02/07/2021)   Hunger Vital Sign    Worried About Running Out of Food in the Last Year: Never true    [place] in the Last Year: Never true  Transportation Needs: No Transportation Needs (02/07/2021)   PRAPARE - Hydrologist (Medical): No    Lack of Transportation (Non-Medical): No  Physical Activity: Insufficiently Active (02/07/2021)   Exercise Vital Sign    Days of Exercise per Week: 3 days    Minutes of Exercise per Session: 20 min  Stress: No Stress Concern Present (02/07/2021)   [place]    Feeling of Stress : Not at all  Social Connections: [place] (02/07/2021)   Social Connection and Isolation Panel [NHANES]    Frequency of Communication with Friends and Family: More than three times a week    Frequency of Social Gatherings with Friends and Family: More than three times a week    Attends Religious Services: More than 4 times per year    Active Member of [employer] or Organizations: Yes    Attends Music therapist: More than 4 times per year    Marital Status: Married  Human resources officer Violence: Not At Risk (02/07/2021)   Humiliation, Afraid, Rape, and Kick questionnaire     Fear of Current or Ex-Partner: No    Emotionally Abused: No    Physically Abused: No    Sexually Abused: No     There were no vitals taken for this visit.  Physical Exam:  Well appearing NAD HEENT: Unremarkable Neck:  No JVD, no thyromegally Lymphatics:  No adenopathy Back:  No CVA tenderness Lungs:  Clear  HEART:  Regular rate rhythm, no murmurs, no rubs, no clicks Abd:  soft, positive bowel sounds, no organomegally, no rebound, no guarding Ext:  2 plus pulses, no edema, no cyanosis, no clubbing Skin:  No rashes no nodules Neuro:  CN II through XII intact, motor grossly intact  EKG  DEVICE  Normal device function.  See PaceArt for details.   Assess/Plan:  1. Chronic diastolic heart failure - he will continue his lasix.  2. Sinus node dysfunction - he is s/p PPM insertion. He is asymptomatic. 3. Obesity - he is encouraged to lose weight.  4. PPM - his biotronik DDD PM is working normally. He is pacing 98% of the time in the atrium and almost none in the ventricle due to sinus node dysfunction.   Carleene Overlie Millie Forde,MD

## 2021-09-19 NOTE — Patient Instructions (Signed)
Medication Instructions:  Your physician recommends that you continue on your current medications as directed. Please refer to the Current Medication list given to you today.  *If you need a refill on your cardiac medications before your next appointment, please call your pharmacy*   Lab Work: NONE   If you have labs (blood work) drawn today and your tests are completely normal, you will receive your results only by: MyChart Message (if you have MyChart) OR A paper copy in the mail If you have any lab test that is abnormal or we need to change your treatment, we will call you to review the results.   Testing/Procedures: NONE    Follow-Up: At [facility], you and your health needs are our priority.  As part of our continuing mission to provide you with exceptional heart care, we have created designated Provider Care Teams.  These Care Teams include your primary Cardiologist (physician) and Advanced Practice Providers (APPs -  Physician Assistants and Nurse Practitioners) who all work together to provide you with the care you need, when you need it.  We recommend signing up for the patient portal called "MyChart".  Sign up information is provided on this After Visit Summary.  MyChart is used to connect with patients for Virtual Visits (Telemedicine).  Patients are able to view lab/test results, encounter notes, upcoming appointments, etc.  Non-urgent messages can be sent to your provider as well.   To learn more about what you can do with MyChart, go to [email or web address].    Your next appointment:   1 year(s)  The format for your next appointment:   In Person  Provider:   Gregg Taylor, MD    Other Instructions Thank you for choosing [facility]!    Important Information About Sugar       

## 2021-09-19 NOTE — Addendum Note (Signed)
Addended by: Christella Scheuermann C on: 09/19/2021 02:11 PM   Modules accepted: Orders

## 2021-10-04 ENCOUNTER — Ambulatory Visit (INDEPENDENT_AMBULATORY_CARE_PROVIDER_SITE_OTHER): Payer: Medicare Other

## 2021-10-04 DIAGNOSIS — I495 Sick sinus syndrome: Secondary | ICD-10-CM

## 2021-10-04 LAB — CUP PACEART REMOTE DEVICE CHECK
Date Time Interrogation Session: 20230628074455
Implantable Lead Implant Date: 20191216
Implantable Lead Implant Date: 20191216
Implantable Lead Location: 753859
Implantable Lead Location: 753860
Implantable Lead Model: 377
Implantable Lead Model: 377
Implantable Lead Serial Number: 80840322
Implantable Lead Serial Number: 80924555
Implantable Pulse Generator Implant Date: 20191216
Pulse Gen Model: 407145
Pulse Gen Serial Number: 69486427

## 2021-10-12 ENCOUNTER — Encounter: Payer: Self-pay | Admitting: Internal Medicine

## 2021-10-12 ENCOUNTER — Other Ambulatory Visit: Payer: Self-pay | Admitting: Pulmonary Disease

## 2021-10-12 DIAGNOSIS — J4530 Mild persistent asthma, uncomplicated: Secondary | ICD-10-CM

## 2021-10-23 NOTE — Progress Notes (Signed)
Remote pacemaker transmission.   

## 2021-10-27 ENCOUNTER — Encounter: Payer: Self-pay | Admitting: Urology

## 2021-10-27 ENCOUNTER — Ambulatory Visit (INDEPENDENT_AMBULATORY_CARE_PROVIDER_SITE_OTHER): Payer: Medicare Other | Admitting: Urology

## 2021-10-27 VITALS — BP 107/72 | HR 79

## 2021-10-27 DIAGNOSIS — R351 Nocturia: Secondary | ICD-10-CM

## 2021-10-27 DIAGNOSIS — N401 Enlarged prostate with lower urinary tract symptoms: Secondary | ICD-10-CM | POA: Diagnosis not present

## 2021-10-27 DIAGNOSIS — N138 Other obstructive and reflux uropathy: Secondary | ICD-10-CM | POA: Diagnosis not present

## 2021-10-27 LAB — URINALYSIS, ROUTINE W REFLEX MICROSCOPIC
Bilirubin, UA: NEGATIVE
Glucose, UA: NEGATIVE
Ketones, UA: NEGATIVE
Leukocytes,UA: NEGATIVE
Nitrite, UA: NEGATIVE
Protein,UA: NEGATIVE
RBC, UA: NEGATIVE
Specific Gravity, UA: 1.015 (ref 1.005–1.030)
Urobilinogen, Ur: 0.2 mg/dL (ref 0.2–1.0)
pH, UA: 7 (ref 5.0–7.5)

## 2021-10-27 LAB — BLADDER SCAN AMB NON-IMAGING: Scan Result: 0

## 2021-10-27 MED ORDER — TAMSULOSIN HCL 0.4 MG PO CAPS
0.4000 mg | ORAL_CAPSULE | Freq: Every day | ORAL | 3 refills | Status: DC
Start: 2021-10-27 — End: 2022-10-26

## 2021-10-27 NOTE — Patient Instructions (Signed)

## 2021-10-27 NOTE — Progress Notes (Signed)
10/27/2021 12:58 PM   Terry Love September 20, 1935 [id number]  Referring provider: Fayrene Helper, MD [address] [place],  [place] [address]  Followup BPH   HPI: Mr Brigham is a 86yo here for followup for BPH with nocturia. IPSS 14 QOl 1 on flomax 0.'4mg'$  daily. He has an intermittent weak stream which does not bother him. He has intermittent urinary hesitancy. Nocturia 3x. No dysuria or hematuria. No other complaints today.    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 Rehman:Examination performed to cecum Pan colonic diverticulosis/2 small polyps ablated via cold biopsy from transverse colon/External hemorrhoids, benign polyps   COLONOSCOPY N/A 06/29/2013   Procedure: COLONOSCOPY;  Surgeon: Daneil Dolin, MD;  [facility];  Service: Endoscopy;  Laterality: N/A;  10:45   EYE SURGERY     left eye cataracts    PACEMAKER IMPLANT N/A 03/24/2018   Procedure: PACEMAKER IMPLANT;  Surgeon: Evans Lance, MD;  Location: [facility];  Service: Cardiovascular;  Laterality: N/A;    Home Medications:  Allergies as of 10/27/2021   No Known Allergies      Medication List        Accurate as of October 27, 2021 12:58 PM. If you have any questions, ask your nurse or doctor.          albuterol 108 (90 Base) MCG/ACT inhaler Commonly known as: Ventolin HFA INHALE 2 PUFFS INTO THE LUNGS EVERY 4 (FOUR) HOURS AS NEEDED FOR WHEEZING OR SHORTNESS OF BREATH   aspirin EC 81 MG tablet Take 81 mg by mouth at bedtime.    atorvastatin 40 MG tablet Commonly known as: LIPITOR Take 1 tablet (40 mg total) by mouth at bedtime.   benzonatate 100 MG capsule Commonly known as: TESSALON Take 1 capsule (100 mg total) by mouth 2 (two) times daily as needed for cough.   calcium-vitamin D 500-200 MG-UNIT tablet Commonly known as: OSCAL WITH D Take 1 tablet by mouth 2 (two) times daily.   carboxymethylcellulose 0.5 % Soln Commonly known as: REFRESH PLUS INSTILL 1 DROP IN EACH EYE FOUR TIMES A DAY   dorzolamide-timolol 22.3-6.8 MG/ML ophthalmic solution Commonly known as: COSOPT INSTILL 1 DROP IN [facility] EYE TWO TIMES A DAY   famotidine 20 MG tablet Commonly known as: Pepcid One after supper   furosemide 40 MG tablet Commonly known as: LASIX Take 1 tablet (40 mg total) by mouth daily.   latanoprost 0.005 % ophthalmic solution Commonly known as: XALATAN Place 1 drop into both eyes at bedtime.   montelukast 10 MG tablet Commonly known as: SINGULAIR TAKE 1 TABLET BY MOUTH EVERYDAY AT BEDTIME   multivitamin with minerals Tabs tablet Take 1 tablet by mouth at bedtime.   omeprazole 40 MG capsule Commonly known as: PRILOSEC TAKE 1 CAPSULE (40 MG TOTAL) BY MOUTH DAILY.   sertraline 25 MG  tablet Commonly known as: ZOLOFT Take 25 mg by mouth daily.   Symbicort 160-4.5 MCG/ACT inhaler Generic drug: budesonide-formoterol INHALE 2 PUFFS INTO THE LUNGS IN THE MORNING AND AT BEDTIME.   tamsulosin 0.4 MG Caps capsule Commonly known as: FLOMAX Take 1 capsule (0.4 mg total) by mouth daily.   venlafaxine XR 150 MG 24 hr capsule Commonly known as: EFFEXOR-XR Take 1 capsule by mouth every morning.   vitamin C 500 MG tablet Commonly known as: ASCORBIC ACID Take 500 mg by mouth at bedtime.   Vitamin D (Ergocalciferol) 1.25 MG (50000 UNIT) Caps capsule Commonly known as: DRISDOL TAKE 1 CAPSULE (50,000 UNITS TOTAL) BY MOUTH EVERY MONDAY. IN THE MORNING        Allergies: No Known Allergies  Family  History: Family History  Problem Relation Age of Onset   Colon cancer Mother        diagnosed at age 44   Aneurysm Father    Aneurysm Brother    Stroke Brother     Social History:  reports that he has never smoked. He quit smokeless tobacco use about 5 years ago.  His smokeless tobacco use included chew. He reports that he does not drink alcohol and does not use drugs.  ROS: All other review of systems were reviewed and are negative except what is noted above in HPI  Physical Exam: BP 107/72   Pulse 79   Constitutional:  Alert and oriented, No acute distress. HEENT: [place] AT, moist mucus membranes.  Trachea midline, no masses. Cardiovascular: No clubbing, cyanosis, or edema. Respiratory: Normal respiratory effort, no increased work of breathing. GI: Abdomen is soft, nontender, nondistended, no abdominal masses GU: No CVA tenderness.  Lymph: No cervical or inguinal lymphadenopathy. Skin: No rashes, bruises or suspicious lesions. Neurologic: Grossly intact, no focal deficits, moving all 4 extremities. Psychiatric: Normal mood and affect.  Laboratory Data: Lab Results  Component Value Date   WBC 5.7 07/05/2021   HGB 12.7 (L) 07/05/2021   HCT 38.7 07/05/2021   MCV 92 07/05/2021   PLT 285 07/05/2021    Lab Results  Component Value Date   CREATININE 0.81 07/05/2021    Lab Results  Component Value Date   PSA <0.1 12/01/2019   PSA 0.2 09/08/2018   PSA 0.1 09/03/2017    No results found for: "TESTOSTERONE"  Lab Results  Component Value Date   HGBA1C 5.3 07/31/2018    Urinalysis    Component Value Date/Time   APPEARANCEUR Clear 10/21/2020 1552   GLUCOSEU Negative 10/21/2020 1552   BILIRUBINUR Negative 10/21/2020 1552   PROTEINUR Negative 10/21/2020 1552   UROBILINOGEN 0.2 11/24/2012 1350   NITRITE Negative 10/21/2020 1552   LEUKOCYTESUR Negative 10/21/2020 1552    Lab Results  Component Value Date   LABMICR Comment 10/21/2020    Pertinent Imaging:  No  results found for this or any previous visit.  No results found for this or any previous visit.  No results found for this or any previous visit.  No results found for this or any previous visit.  No results found for this or any previous visit.  No results found for this or any previous visit.  No results found for this or any previous visit.  No results found for this or any previous visit.   Assessment & Plan:    1. Benign prostatic hyperplasia with urinary obstruction -continue flomax 0.'4mg'$  daily - Urinalysis, Routine w reflex microscopic - BLADDER SCAN AMB NON-IMAGING  2. Nocturia -continue flomax  0.'4mg'$  daily   No follow-ups on file.  Nicolette Bang, MD  [facility] [place]

## 2021-10-27 NOTE — Progress Notes (Signed)
post void residual=0 ?

## 2021-12-12 ENCOUNTER — Encounter: Payer: Self-pay | Admitting: Family Medicine

## 2021-12-12 ENCOUNTER — Ambulatory Visit (INDEPENDENT_AMBULATORY_CARE_PROVIDER_SITE_OTHER): Payer: Medicare Other | Admitting: Family Medicine

## 2021-12-12 VITALS — BP 104/64 | HR 69 | Ht 70.0 in | Wt 213.0 lb

## 2021-12-12 DIAGNOSIS — R0989 Other specified symptoms and signs involving the circulatory and respiratory systems: Secondary | ICD-10-CM

## 2021-12-12 DIAGNOSIS — E669 Obesity, unspecified: Secondary | ICD-10-CM

## 2021-12-12 DIAGNOSIS — F322 Major depressive disorder, single episode, severe without psychotic features: Secondary | ICD-10-CM

## 2021-12-12 DIAGNOSIS — E785 Hyperlipidemia, unspecified: Secondary | ICD-10-CM | POA: Diagnosis not present

## 2021-12-12 DIAGNOSIS — E559 Vitamin D deficiency, unspecified: Secondary | ICD-10-CM

## 2021-12-12 NOTE — Progress Notes (Unsigned)
   Terry Love     MRN: [id number]      DOB: Mar 25, 1936   HPI Terry Love is here for follow up and re-evaluation of chronic medical conditions, medication management and review of any available recent lab and radiology data.  Preventive health is updated, specifically  Cancer screening and Immunization.   Concerned about "low blood pressure" Wants to reduce medication burden C/o excess hunger wants to know how to reduce this No regular exercise and intends to change this ROS Denies recent fever or chills. Denies sinus pressure, nasal congestion, ear pain or sore throat. Denies chest congestion, productive cough or wheezing. Denies chest pains, palpitations and leg swelling Denies abdominal pain, nausea, vomiting,diarrhea or constipation.   Denies dysuria, frequency, hesitancy or incontinence. Denies joint pain, swelling and limitation in mobility. Denies headaches, seizures, numbness, or tingling. Denies depression, anxiety or insomnia. Denies skin break down or rash.   PE  BP 104/64   Pulse 69   Ht 5\' 10"  (1.778 m)   Wt 213 lb 0.6 oz (96.6 kg)   SpO2 93%   BMI 30.57 kg/m   Patient alert and oriented and in no cardiopulmonary distress.  HEENT: No facial asymmetry, EOMI,     Neck supple .  Chest: Clear to auscultation bilaterally.  CVS: S1, S2 no murmurs, no S3.Regular rate.  ABD: Soft non tender.   Ext: No edema  MS: Adequate ROM spine, shoulders, hips and knees.  Skin: Intact, no ulcerations or rash noted.  Psych: Good eye contact, reduced hearing , flat affect. Memory impaired not anxious or depressed appearing.  CNS: CN 2-12 intact, power,  normal throughout.no focal deficits noted.   Assessment & Plan  Hyperlipidemia LDL goal <100 Hyperlipidemia:Low fat diet discussed and encouraged.   Lipid Panel  Lab Results  Component Value Date   CHOL 147 07/05/2021   HDL 55 07/05/2021   LDLCALC 78 07/05/2021   TRIG 69 07/05/2021   CHOLHDL 2.7 07/05/2021      Controlled, no change in medication Updated lab needed at/ before next visit.   Obesity (BMI 30.0-34.9)  Patient re-educated about  the importance of commitment to a  minimum of 150 minutes of exercise per week as able.  The importance of healthy food choices with portion control discussed, as well as eating regularly and within a 12 hour window most days. The need to choose "clean , green" food 50 to 75% of the time is discussed, as well as to make water the primary drink and set a goal of 64 ounces water daily.       12/12/2021    1:06 PM 09/19/2021    1:19 PM 06/21/2021    2:24 PM  Weight /BMI  Weight 213 lb 0.6 oz 214 lb 217 lb 6.4 oz  Height 5\' 10"  (1.778 m) 5\' 10"  (1.778 m) 5\' 10"  (1.778 m)  BMI 30.57 kg/m2 30.71 kg/m2 31.19 kg/m2      Depression, major, single episode, severe (HCC) Managed by psych   Labile blood pressure Concerned about low blood pressure

## 2021-12-12 NOTE — Patient Instructions (Addendum)
F/U in 5 months, call if you need me sooner  Please commit to riding your stationary bike for 15 to 30 mins at least 5 days per week, this will help to strengthen all muscles  Fasting lipid, cmp and eGFR and vit d level in next 1 week  Please get new covid booster when available  Nurse to arrange flu vaccine in office when available  Discuss " low blood pressure " with Cardiology at visit, as well as if you cqn stop furosemide or lower the dose  Discuss with GI reducing dose of omeprazole  and the hope of reducing pill burden  Increase vegetable and fruit intake and snack on htose when very hungry  Thanks for choosing [facility], we consider it a privelige to serve you.     

## 2021-12-13 ENCOUNTER — Ambulatory Visit: Payer: Medicare Other | Admitting: Gastroenterology

## 2021-12-13 ENCOUNTER — Encounter: Payer: Self-pay | Admitting: Family Medicine

## 2021-12-13 NOTE — Assessment & Plan Note (Signed)
Managed by psych 

## 2021-12-13 NOTE — Assessment & Plan Note (Signed)
Concerned about low blood pressure

## 2021-12-13 NOTE — Assessment & Plan Note (Signed)
Hyperlipidemia:Low fat diet discussed and encouraged.   Lipid Panel  Lab Results  Component Value Date   CHOL 147 07/05/2021   HDL 55 07/05/2021   LDLCALC 78 07/05/2021   TRIG 69 07/05/2021   CHOLHDL 2.7 07/05/2021     Controlled, no change in medication Updated lab needed at/ before next visit.

## 2021-12-13 NOTE — Assessment & Plan Note (Signed)
  Patient re-educated about  the importance of commitment to a  minimum of 150 minutes of exercise per week as able.  The importance of healthy food choices with portion control discussed, as well as eating regularly and within a 12 hour window most days. The need to choose "clean , green" food 50 to 75% of the time is discussed, as well as to make water the primary drink and set a goal of 64 ounces water daily.       12/12/2021    1:06 PM 09/19/2021    1:19 PM 06/21/2021    2:24 PM  Weight /BMI  Weight 213 lb 0.6 oz 214 lb 217 lb 6.4 oz  Height '5\' 10"'$  (1.778 m) '5\' 10"'$  (1.778 m) '5\' 10"'$  (1.778 m)  BMI 30.57 kg/m2 30.71 kg/m2 31.19 kg/m2

## 2021-12-18 ENCOUNTER — Ambulatory Visit (INDEPENDENT_AMBULATORY_CARE_PROVIDER_SITE_OTHER): Payer: Medicare Other | Admitting: Internal Medicine

## 2021-12-18 ENCOUNTER — Encounter: Payer: Self-pay | Admitting: Internal Medicine

## 2021-12-18 DIAGNOSIS — E785 Hyperlipidemia, unspecified: Secondary | ICD-10-CM | POA: Diagnosis not present

## 2021-12-18 DIAGNOSIS — R0609 Other forms of dyspnea: Secondary | ICD-10-CM | POA: Diagnosis not present

## 2021-12-18 DIAGNOSIS — J4530 Mild persistent asthma, uncomplicated: Secondary | ICD-10-CM

## 2021-12-18 DIAGNOSIS — E559 Vitamin D deficiency, unspecified: Secondary | ICD-10-CM | POA: Diagnosis not present

## 2021-12-18 NOTE — Patient Instructions (Addendum)
Plan A = Automatic = Always=    Symbicort  160 Take 2 puffs first thing in am and then another 2 puffs about 12 hours later.    Work on inhaler technique:  relax and gently blow all the way out then take a nice smooth full deep breath back in, triggering the inhaler at same time you start breathing in.  Hold breath in for at least  5 seconds if you can. Blow out symbcoort thru nose. Rinse and gargle with water when done.  If mouth or throat bother you at all,  try brushing teeth/gums/tongue with arm and hammer toothpaste/ make a slurry and gargle and spit out.     - remember how golfers take practice swings - use your empty symbicort for training before each use   Plan B = Backup (to supplement plan A, not to replace it) Only use your albuterol inhaler as a rescue medication to be used if you can't catch your breath by resting or doing a relaxed purse lip breathing pattern.  - The less you use it, the better it will work when you need it. - Ok to use the inhaler up to 2 puffs  every 4 hours if you must but call for appointment if use goes up over your usual need - Don't leave home without it !!  (think of it like the spare tire for your car)      Please schedule a follow up visit in 3 months but call sooner if needed- bring your inhalers

## 2021-12-18 NOTE — Progress Notes (Unsigned)
Terry Love, male    DOB: [birth year], 86 y.o.   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  FENO 09/05/16- 54  Labs: CBC with diff 09/05/16- WBC 8.2, 5.4% eos, absolute eso count 460 Blood allergy screen  09/05/16- negative RAST, IgE 150  Cardiac: Echo 03/05/2018 LVH, EF 60 to 08%, grade 1 diastolic dysfunction      History of Present Illness  05/09/2021  Pulmonary/ 1st office eval/ Ewin Rehberg / [place] [facility] re asthma maint on symbicort 160 with poor techique  Chief Complaint  Patient presents with   New Patient (Initial Visit)    Has seen Dr. Vaughan Browner in the past is switching to [place] office for location.   Concerns about breathing   Using symbicort 2 puffs in am and 2 puffs in pm. Wants to discuss different inhaler costs.   Dyspnea:  walking about 50 ft and stop x sev years  Cough: not a problem  Sleep: bed is flat/ on side s am flare but sometimes wheeze per wife  SABA use:  several times a week but never prechallenges  Rec Continue omeprazole 40 mg Take 30-60 min before first meal of the day  Add pepcid 20 mg after supper  Work on inhaler technique:   Ok to try albuterol 15 min before an activity (on alternating days)  that you know would usually make you short of breath GERD diet reviewed, bed blocks rec  Please schedule a follow up office visit in 6 weeks, call sooner if needed with all medications /inhalers/ solutions in hand    06/21/2021  f/u ov/[place] office/Taryn Shellhammer re: doe/ ? asthma maint on symb 160 2bid hfa much better   Chief Complaint  Patient presents with   Follow-up    Breathing has improved but patient still feels like he cannot get a full deep breath.  Using symbicort inhaler.   Dyspnea:  storage building is 75 ft flat  morning  Cough: none  Sleeping: sleeping propped helps  SABA use: once a day  02: none  Covid status: up to date x for bivalent Rec No change in medications. Work on inhaler technique:    12/18/2021  f/u ov/[place] office/Lurae Hornbrook re: asthma maint on symbicort 160  2bid with suboptimal hfa Chief Complaint  Patient presents with   Follow-up    Feels breathing is getting better    Dyspnea:  easier than it was to do adls but not very active Cough: none  Sleeping: on back/ props up on  pillows and sleepis fine SABA use: rarely now  02: none       No obvious day to day or daytime variability or assoc excess/ purulent sputum or mucus plugs or hemoptysis or cp or chest tightness, subjective wheeze or overt sinus or hb symptoms.   Sleeping now  without nocturnal  or early am exacerbation  of respiratory  c/o's or need for noct saba. Also denies any obvious fluctuation of symptoms with weather or environmental changes or other aggravating or alleviating factors except as outlined above   No unusual exposure hx or h/o childhood pna/ asthma or knowledge of premature birth.  Current Allergies, Complete Past  Medical History, Past Surgical History, Family History, and Social History were reviewed in [employer] record.  ROS  The following are not active complaints unless bolded Hoarseness, sore throat, dysphagia, dental problems, itching, sneezing,  nasal congestion or discharge of excess mucus or purulent secretions, ear ache,   fever, chills, sweats, unintended wt loss or wt gain, classically pleuritic or exertional cp,  orthopnea pnd or arm/hand swelling  or leg swelling, presyncope, palpitations, abdominal pain, anorexia, nausea, vomiting, diarrhea  or change in bowel habits or change in bladder habits, change in stools or change in urine, dysuria, hematuria,  rash, arthralgias, visual complaints,  headache, numbness, weakness or ataxia or problems with walking or coordination,  change in mood or  memory. Mild tremor        Current Meds  Medication Sig   albuterol (VENTOLIN HFA) 108 (90 Base) MCG/ACT inhaler INHALE 2 PUFFS INTO THE LUNGS EVERY 4 (FOUR) HOURS AS NEEDED FOR WHEEZING OR SHORTNESS OF BREATH   aspirin 81 MG EC tablet Take 81 mg by mouth at bedtime.   atorvastatin (LIPITOR) 40 MG tablet Take 1 tablet (40 mg total) by mouth at bedtime.   benzonatate (TESSALON) 100 MG capsule Take 1 capsule (100 mg total) by mouth 2 (two) times daily as needed for cough.   budesonide-formoterol (SYMBICORT) 160-4.5 MCG/ACT inhaler INHALE 2 PUFFS INTO THE LUNGS IN THE MORNING AND AT BEDTIME.   calcium-vitamin D (OSCAL WITH D) 500-200 MG-UNIT tablet Take 1 tablet by mouth 2 (two) times daily.   carboxymethylcellulose (REFRESH PLUS) 0.5 % SOLN INSTILL 1 DROP IN EACH EYE FOUR TIMES A DAY   dorzolamide-timolol (COSOPT) 22.3-6.8 MG/ML ophthalmic solution INSTILL 1 DROP IN [facility] EYE TWO TIMES A DAY   famotidine (PEPCID) 20 MG tablet One after supper   furosemide (LASIX) 40 MG tablet Take 1 tablet (40 mg total) by mouth daily.   latanoprost (XALATAN) 0.005 % ophthalmic solution Place 1 drop into both eyes at bedtime.   montelukast (SINGULAIR) 10 MG tablet TAKE 1 TABLET BY MOUTH EVERYDAY AT BEDTIME   Multiple Vitamin (MULTIVITAMIN WITH MINERALS) TABS tablet Take 1 tablet by mouth at bedtime.   omeprazole (PRILOSEC) 40 MG capsule TAKE 1 CAPSULE (40 MG TOTAL) BY MOUTH DAILY.   sertraline (ZOLOFT) 25 MG tablet Take 25 mg by mouth daily.   tamsulosin (FLOMAX) 0.4 MG CAPS capsule Take 1 capsule (0.4 mg total) by mouth daily.   venlafaxine XR (EFFEXOR-XR) 150 MG 24 hr capsule Take 1 capsule by mouth every morning.   vitamin C (ASCORBIC ACID) 500 MG tablet Take 500 mg by mouth at bedtime.    Vitamin D, Ergocalciferol, (DRISDOL) 1.25 MG (50000 UNIT) CAPS capsule TAKE 1 CAPSULE (50,000 UNITS TOTAL) BY MOUTH EVERY  MONDAY. 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   Obesity    Seasonal allergies         Objective:     12/18/2021       215   06/21/21 217 lb 6.4 oz (98.6 kg)  06/16/21 218 lb (98.9 kg)  06/14/21 219 lb (99.3 kg)      Vital signs reviewed  12/18/2021  - Note at rest 02 sats  97% on RA   General appearance:    stoic amb bm nad / full dentures     HEENT : Oropharynx  clear     Nasal turbinates nl    NECK :  without  apparent JVD/ palpable Nodes/TM    LUNGS: no acc muscle use,  Nl contour chest which is clear to A and P bilaterally without cough on insp or exp maneuvers   CV:  RRR  no s3 or murmur or increase in P2, and no edema   ABD:  soft and nontender with nl inspiratory excursion in the supine position. No bruits or organomegaly appreciated   MS:  Nl gait/ ext warm without deformities Or obvious joint restrictions  calf tenderness, cyanosis or clubbing    SKIN: warm and dry without lesions    NEURO:  alert, approp, nl sensorium with  no motor or cerebellar deficits apparent. Slt resting tremor         Assessment

## 2021-12-19 ENCOUNTER — Encounter: Payer: Self-pay | Admitting: Internal Medicine

## 2021-12-19 ENCOUNTER — Telehealth: Payer: Self-pay | Admitting: Family Medicine

## 2021-12-19 ENCOUNTER — Encounter: Payer: Self-pay | Admitting: Gastroenterology

## 2021-12-19 ENCOUNTER — Ambulatory Visit (INDEPENDENT_AMBULATORY_CARE_PROVIDER_SITE_OTHER): Payer: Medicare Other | Admitting: Gastroenterology

## 2021-12-19 DIAGNOSIS — R140 Abdominal distension (gaseous): Secondary | ICD-10-CM | POA: Diagnosis not present

## 2021-12-19 LAB — CMP14+EGFR
ALT: 16 IU/L (ref 0–44)
AST: 20 IU/L (ref 0–40)
Albumin/Globulin Ratio: 1.7 (ref 1.2–2.2)
Albumin: 4.2 g/dL (ref 3.7–4.7)
Alkaline Phosphatase: 62 IU/L (ref 44–121)
BUN/Creatinine Ratio: 13 (ref 10–24)
BUN: 11 mg/dL (ref 8–27)
Bilirubin Total: 0.5 mg/dL (ref 0.0–1.2)
CO2: 27 mmol/L (ref 20–29)
Calcium: 9 mg/dL (ref 8.6–10.2)
Chloride: 106 mmol/L (ref 96–106)
Creatinine, Ser: 0.86 mg/dL (ref 0.76–1.27)
Globulin, Total: 2.5 g/dL (ref 1.5–4.5)
Glucose: 115 mg/dL — ABNORMAL HIGH (ref 70–99)
Potassium: 5.1 mmol/L (ref 3.5–5.2)
Sodium: 143 mmol/L (ref 134–144)
Total Protein: 6.7 g/dL (ref 6.0–8.5)
eGFR: 85 mL/min/{1.73_m2} (ref >59)

## 2021-12-19 LAB — LIPID PANEL
Chol/HDL Ratio: 3 ratio (ref 0.0–5.0)
Cholesterol, Total: 151 mg/dL (ref 100–199)
HDL: 50 mg/dL (ref >39)
LDL Chol Calc (NIH): 89 mg/dL (ref 0–99)
Triglycerides: 57 mg/dL (ref 0–149)
VLDL Cholesterol Cal: 12 mg/dL (ref 5–40)

## 2021-12-19 LAB — VITAMIN D 25 HYDROXY (VIT D DEFICIENCY, FRACTURES): Vit D, 25-Hydroxy: 55.9 ng/mL (ref 30.0–100.0)

## 2021-12-19 NOTE — Patient Instructions (Signed)
These are the foods that can increase bloating and gas. Try to limit foods on this list. Take note if your bloating improves with limiting these foods.   Call if persistent bloating, or you develop any abdominal pain.   Return to the office in one year or sooner if needed. Foods to avoid Fruits Fresh, dried, and juiced forms of apple, pear, watermelon, peach, plum, cherries, apricots, blackberries, boysenberries, figs, nectarines, and mango. Avocado. Vegetables Chicory root, artichoke, asparagus, cabbage, snow peas, Brussels sprouts, broccoli, sugar snap peas, mushrooms, celery, and cauliflower. Onions, garlic, leeks, and the white part of scallions. Grains Wheat, including kamut, durum, and semolina. Barley and bulgur. Couscous. Wheat-based cereals. Wheat noodles, bread, crackers, and pastries. Meats and other proteins Fried or fatty meat. Sausage. Cashews and pistachios. Soybeans, baked beans, black beans, chickpeas, kidney beans, fava beans, navy beans, lentils, black-eyed peas, and split peas. Dairy Milk, yogurt, ice cream, and soft cheese. Cream and sour cream. Milk-based sauces. Custard. Buttermilk. Soy milk. Seasoning and other foods Any sugar-free gum or candy. Foods that contain artificial sweeteners such as sorbitol, mannitol, isomalt, or xylitol. Foods that contain honey, high-fructose corn syrup, or agave. Bouillon, vegetable stock, beef stock, and chicken stock. Garlic and onion powder. Condiments made with onion, such as hummus, chutney, pickles, relish, salad dressing, and salsa. Tomato paste. Beverages Chicory-based drinks. Coffee substitutes. Chamomile tea. Fennel tea. Sweet or fortified wines such as port or sherry. Diet soft drinks made with isomalt, mannitol, maltitol, sorbitol, or xylitol. Apple, pear, and mango juice. Juices with high-fructose corn syrup. The items listed above may not be a complete list of foods and beverages you should avoid. Contact a dietitian for more  information. Summary FODMAP stands for fermentable oligosaccharides, disaccharides, monosaccharides, and polyols. These are sugars that are hard for some people to digest. A low-FODMAP eating plan is a short-term diet that helps to ease symptoms of certain bowel diseases. The eating plan usually lasts up to 6 weeks. After that, high-FODMAP foods are reintroduced gradually and one at a time. This can help you find out which foods may be causing symptoms. A low-FODMAP eating plan can be complicated. It is best to work with a dietitian who has experience with this type of plan. This information is not intended to replace advice given to you by your health care provider. Make sure you discuss any questions you have with your health care provider. Document Revised: 08/13/2019 Document Reviewed: 08/13/2019 Elsevier Patient Education  [place].

## 2021-12-19 NOTE — Progress Notes (Signed)
GI Office Note    Referring Provider: Fayrene Helper, MD Primary Care Physician:  Fayrene Helper, MD  Primary Gastroenterologist: Garfield Cornea, MD   Chief Complaint   Chief Complaint  Patient presents with   Follow-up    Having some issues with bloating.     History of Present Illness   Terry Love is a 86 y.o. male presenting today for follow up. Last seen in 10/2020. He has history of constipation, bloating.   Comes in with vague bloating, gas. No abdominal pain. BM ok, occasional constipation. He continues omeprazole daily and sometimes famotidine at supper. No heartburn. No dysphagia. He is most concerned about not being able to lose weight. He is not as active as before. He has tried to make healthy diet changes. His wife states he eats ice cream at night. He has history of mild stable anemia in the 12 range. Folate/iron/ferritin all normal in 05/2021 at the [place].    Abd U/S: 06/2021 IMPRESSION: 1. Fatty liver. 2. Small right renal interpolar cyst.    Medications   Current Outpatient Medications  Medication Sig Dispense Refill   albuterol (VENTOLIN HFA) 108 (90 Base) MCG/ACT inhaler INHALE 2 PUFFS INTO THE LUNGS EVERY 4 (FOUR) HOURS AS NEEDED FOR WHEEZING OR SHORTNESS OF BREATH 18 g 11   aspirin 81 MG EC tablet Take 81 mg by mouth at bedtime.     atorvastatin (LIPITOR) 40 MG tablet Take 1 tablet (40 mg total) by mouth at bedtime. 90 tablet 3   benzonatate (TESSALON) 100 MG capsule Take 1 capsule (100 mg total) by mouth 2 (two) times daily as needed for cough. 20 capsule 4   budesonide-formoterol (SYMBICORT) 160-4.5 MCG/ACT inhaler INHALE 2 PUFFS INTO THE LUNGS IN THE MORNING AND AT BEDTIME. 10.2 each 5   calcium-vitamin D (OSCAL WITH D) 500-200 MG-UNIT tablet Take 1 tablet by mouth 2 (two) times daily. 150 tablet 1   carboxymethylcellulose (REFRESH PLUS) 0.5 % SOLN INSTILL 1 DROP IN EACH EYE FOUR TIMES A DAY     dorzolamide-timolol (COSOPT) 22.3-6.8 MG/ML  ophthalmic solution INSTILL 1 DROP IN [facility] EYE TWO TIMES A DAY     famotidine (PEPCID) 20 MG tablet One after supper 30 tablet 11   furosemide (LASIX) 40 MG tablet Take 1 tablet (40 mg total) by mouth daily. 90 tablet 3   latanoprost (XALATAN) 0.005 % ophthalmic solution Place 1 drop into both eyes at bedtime.     montelukast (SINGULAIR) 10 MG tablet TAKE 1 TABLET BY MOUTH EVERYDAY AT BEDTIME 90 tablet 1   Multiple Vitamin (MULTIVITAMIN WITH MINERALS) TABS tablet Take 1 tablet by mouth at bedtime.     omeprazole (PRILOSEC) 40 MG capsule TAKE 1 CAPSULE (40 MG TOTAL) BY MOUTH DAILY. 90 capsule 1   sertraline (ZOLOFT) 25 MG tablet Take 25 mg by mouth daily.     tamsulosin (FLOMAX) 0.4 MG CAPS capsule Take 1 capsule (0.4 mg total) by mouth daily. 90 capsule 3   venlafaxine XR (EFFEXOR-XR) 150 MG 24 hr capsule Take 1 capsule by mouth every morning.     vitamin C (ASCORBIC ACID) 500 MG tablet Take 500 mg by mouth at bedtime.      Vitamin D, Ergocalciferol, (DRISDOL) 1.25 MG (50000 UNIT) CAPS capsule TAKE 1 CAPSULE (50,000 UNITS TOTAL) BY MOUTH EVERY MONDAY. IN THE MORNING 12 capsule 1   No current facility-administered medications for this visit.    Allergies   Allergies as of 12/19/2021   (  No Known Allergies)      Review of Systems   General: Negative for anorexia, weight loss, fever, chills, fatigue, weakness. ENT: Negative for hoarseness, difficulty swallowing , nasal congestion. CV: Negative for chest pain, angina, palpitations, dyspnea on exertion, peripheral edema.  Respiratory: Negative for dyspnea at rest, dyspnea on exertion, cough, sputum, wheezing.  GI: See history of present illness. GU:  Negative for dysuria, hematuria, urinary incontinence, urinary frequency, nocturnal urination.  Endo: Negative for unusual weight change.     Physical Exam   BP 133/76 (BP Location: Right Arm, Patient Position: Sitting, Cuff Size: Large)   Pulse 66   Temp (!) 97.5 F (36.4 C) (Temporal)    Ht '5\' 10"'$  (1.778 m)   Wt 213 lb 12.8 oz (97 kg)   SpO2 96%   BMI 30.68 kg/m    General: Well-nourished, well-developed in no acute distress.  Eyes: No icterus. Mouth: Oropharyngeal mucosa moist and pink , no lesions erythema or exudate.   Abdomen: Bowel sounds are normal, nontender, nondistended, no hepatosplenomegaly or masses,  no abdominal bruits or hernia , no rebound or guarding.  Rectal: not performed Extremities: No lower extremity edema. No clubbing or deformities. Neuro: Alert and oriented x 4   Skin: Warm and dry, no jaundice.   Psych: Alert and cooperative, normal mood and affect.  Labs   Lab Results  Component Value Date   CREATININE 0.86 12/18/2021   BUN 11 12/18/2021   NA 143 12/18/2021   K 5.1 12/18/2021   CL 106 12/18/2021   CO2 27 12/18/2021   Lab Results  Component Value Date   ALT 16 12/18/2021   AST 20 12/18/2021   ALKPHOS 62 12/18/2021   BILITOT 0.5 12/18/2021   Lab Results  Component Value Date   WBC 5.7 07/05/2021   HGB 12.7 (L) 07/05/2021   HCT 38.7 07/05/2021   MCV 92 07/05/2021   PLT 285 07/05/2021   Lab Results  Component Value Date   TSH 1.200 07/05/2021    Imaging Studies   No results found.  Assessment   Bloating: vague symptoms. Likely dietary reasons. Recommend he pay attention to his symptoms and note foods eaten the day before/of his symptoms. Avoid high FODMAP foods. Dairy likely contributing (ice cream). He will call if persistent bloating, abdominal pain, or new symptoms.   PLAN   As above. Return ov in on eyear.    Laureen Ochs. Hanan Rumpf, [place], [facility]

## 2021-12-19 NOTE — Telephone Encounter (Signed)
Pt wife aware and states she will hold off for now since CT scan was clear unless she notices something else concerning

## 2021-12-19 NOTE — Assessment & Plan Note (Addendum)
Never smoker - The proper method of use, as well as anticipated side effects, of a metered-dose inhaler were discussed and demonstrated to the patient using teach back method. Improved effectiveness after extensive coaching during this visit to a level of approximately 50 % from a baseline of 25 % > ok to continue symb 160 2bid since has clearly improved vs baseline.

## 2021-12-19 NOTE — Assessment & Plan Note (Signed)
Onset ? Around 2012 at wt = 246  -  PFTs 09/05/16 FVC 2.90 (84%), FEV1 2.32 (92%), F/F 80, TLC 82%, DLCO 79% Minimal reduction in diffusion capacity. - 05/09/2021   Walked on Ra  x  one  lap(s) =  approx 150  ft  @ slow pace, stopped due to fatigue and sob with lowest 02 sats 97%  - 06/21/2021   Walked on RA  x  3  lap(s) =  approx 450  ft  @ fast pace, stopped due to end of study min sob with lowest 02 sats 96%  - 12/18/2021  After extensive coaching inhaler device,  effectiveness =    50%  > continue symbicort 160   Despite poor hfa > All goals of chronic asthma control met including optimal function and elimination of symptoms with minimal need for rescue therapy.  Contingencies discussed in full including contacting this office immediately if not controlling the symptoms using the rule of two's.     F/u q 3 m, keep working on hfa by using "golfer's approach" to practice inhalations using empty device before each hfa use.         Each maintenance medication was reviewed in detail including emphasizing most importantly the difference between maintenance and prns and under what circumstances the prns are to be triggered using an action plan format where appropriate.  Total time for H and P, chart review, counseling,  and generating customized AVS unique to this office visit / same day charting = 20 min

## 2021-12-19 NOTE — Telephone Encounter (Signed)
At recent visit, wife [name] voicd concerns about increased gait instability and he has memory loss. Please let her know tht he had a head CT scan earlier this year which showed no abnormality to explain this. However I will refer him to Neurology in [place] if she feels that he needs further evaluation for these problems Pls let me know her esponse Thanks Pls call and speak with [name] about this,

## 2021-12-20 ENCOUNTER — Ambulatory Visit (INDEPENDENT_AMBULATORY_CARE_PROVIDER_SITE_OTHER): Payer: Medicare Other

## 2021-12-20 DIAGNOSIS — Z23 Encounter for immunization: Secondary | ICD-10-CM | POA: Diagnosis not present

## 2021-12-20 DIAGNOSIS — R7302 Impaired glucose tolerance (oral): Secondary | ICD-10-CM | POA: Diagnosis not present

## 2021-12-20 LAB — POCT GLYCOSYLATED HEMOGLOBIN (HGB A1C): Hemoglobin A1C: 5.3 % (ref 4.0–5.6)

## 2021-12-20 NOTE — Progress Notes (Signed)
A1c normal and pt aware

## 2021-12-24 ENCOUNTER — Other Ambulatory Visit: Payer: Self-pay | Admitting: Family Medicine

## 2021-12-28 ENCOUNTER — Encounter: Payer: Self-pay | Admitting: Cardiology

## 2021-12-28 ENCOUNTER — Ambulatory Visit: Payer: Medicare Other | Attending: Cardiology | Admitting: Cardiology

## 2021-12-28 VITALS — BP 116/72 | HR 67 | Ht 70.0 in | Wt 214.0 lb

## 2021-12-28 DIAGNOSIS — Z950 Presence of cardiac pacemaker: Secondary | ICD-10-CM | POA: Insufficient documentation

## 2021-12-28 DIAGNOSIS — R001 Bradycardia, unspecified: Secondary | ICD-10-CM | POA: Diagnosis not present

## 2021-12-28 DIAGNOSIS — R0609 Other forms of dyspnea: Secondary | ICD-10-CM | POA: Insufficient documentation

## 2021-12-28 MED ORDER — FUROSEMIDE 20 MG PO TABS: ORAL_TABLET | ORAL | 3 refills | Status: DC

## 2021-12-28 NOTE — Patient Instructions (Signed)
Medication Instructions:  Your physician has recommended you make the following change in your medication:  -Decrease Lasix to 20 mg tablets daily- make take an addition tablet for fluid/swelling   Labwork: None  Testing/Procedures: None  Follow-Up: Follow up in 6 months with Dr. Harl Bowie.   Any Other Special Instructions Will Be Listed Below (If Applicable).     If you need a refill on your cardiac medications before your next appointment, please call your pharmacy.

## 2021-12-28 NOTE — Progress Notes (Signed)
Clinical Summary Terry Love is a 86 y.o.male seen today for follow up of the following medical problems.      1. Symptomatic bradycardia - pacemaker followed by Dr Lovena Le     09/2021 normal device check - no recent symptoms.    2. Hyperlipidemia - 10/2020 TC145 TG 55 HDL 53 LDL 80 - 12/2021 TC 151 TG 57 HDL 50 LDL 89     3. SOB/DOE - chronic DOE, overall unchanged - extensive cardiac and pulmonary workup over the years as reported below overall benign   - chronic SOB mildly worst from baseline.  - reports breathing SOB   SH: married 60 years, his wife Terry Love is also a patient.    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     atorvastatin (LIPITOR) 40 MG tablet Take 1 tablet (40 mg total) by mouth at bedtime. 90 tablet 3   benzonatate (TESSALON) 100 MG capsule Take 1 capsule (100 mg total) by mouth 2 (two) times daily as needed for cough. 20 capsule 4   budesonide-formoterol (SYMBICORT) 160-4.5 MCG/ACT inhaler INHALE 2 PUFFS INTO THE LUNGS IN THE MORNING AND AT BEDTIME. 10.2 each 5   calcium-vitamin D (OSCAL WITH D) 500-200 MG-UNIT tablet Take 1 tablet by mouth 2 (two) times daily. 150 tablet 1   carboxymethylcellulose (REFRESH PLUS) 0.5 % SOLN INSTILL 1 DROP IN EACH EYE FOUR TIMES A DAY     dorzolamide-timolol (COSOPT) 22.3-6.8 MG/ML ophthalmic solution INSTILL 1 DROP IN [facility] EYE TWO TIMES A  DAY     famotidine (PEPCID) 20 MG tablet One after supper 30 tablet 11   furosemide (LASIX) 40 MG tablet Take 1 tablet (40 mg total) by mouth daily. 90 tablet 3   latanoprost (XALATAN) 0.005 % ophthalmic solution Place 1 drop into both eyes at bedtime.     montelukast (SINGULAIR) 10 MG tablet TAKE 1 TABLET BY MOUTH EVERYDAY AT BEDTIME 90 tablet 1   Multiple Vitamin (MULTIVITAMIN WITH MINERALS) TABS tablet Take 1 tablet by mouth at bedtime.     omeprazole (PRILOSEC) 40 MG capsule TAKE 1 CAPSULE (40 MG TOTAL) BY MOUTH DAILY. 90 capsule 1   sertraline (ZOLOFT) 25 MG tablet Take 25 mg by mouth daily.     tamsulosin (FLOMAX) 0.4 MG CAPS capsule Take 1 capsule (0.4 mg total) by mouth daily. 90 capsule 3   venlafaxine XR (EFFEXOR-XR) 150 MG 24 hr capsule Take 1 capsule by mouth every morning.     vitamin C (ASCORBIC ACID) 500 MG tablet Take 500 mg by mouth at bedtime.      Vitamin D, Ergocalciferol, (DRISDOL) 1.25 MG (50000 UNIT) CAPS capsule TAKE 1 CAPSULE (50,000 UNITS TOTAL) BY MOUTH EVERY MONDAY IN  THE MORNING 12 capsule 1   No current facility-administered medications for this visit.     Past Surgical History:  Procedure Laterality Date   carpal tunnel  release right  2006   COLONOSCOPY     COLONOSCOPY  01/25/2011   Dr. Rehman:Examination performed to cecum Pan colonic diverticulosis/2 small polyps ablated via cold biopsy from transverse colon/External hemorrhoids, benign polyps   COLONOSCOPY N/A 06/29/2013   Procedure: COLONOSCOPY;  Surgeon: Daneil Dolin, MD;  Location: [facility];  Service: Endoscopy;  Laterality: N/A;  10:45   EYE SURGERY     left eye cataracts    PACEMAKER IMPLANT N/A 03/24/2018   Procedure: PACEMAKER IMPLANT;  Surgeon: Evans Lance, MD;  Location: [facility];  Service: Cardiovascular;  Laterality: N/A;     No Known Allergies    Family History  Problem Relation Age of Onset   Colon cancer Mother        diagnosed at age 22   Aneurysm Father     Aneurysm Brother    Stroke Brother      Social History Terry Love reports that he has never smoked. He quit smokeless tobacco use about 5 years ago.  His smokeless tobacco use included chew. Terry Love reports no history of alcohol use.   Review of Systems CONSTITUTIONAL: No weight loss, fever, chills, weakness or fatigue.  HEENT: Eyes: No visual loss, blurred vision, double vision or yellow sclerae.No hearing loss, sneezing, congestion, runny nose or sore throat.  SKIN: No rash or itching.  CARDIOVASCULAR: per hpi RESPIRATORY: No shortness of breath, cough or sputum.  GASTROINTESTINAL: No anorexia, nausea, vomiting or diarrhea. No abdominal pain or blood.  GENITOURINARY: No burning on urination, no polyuria NEUROLOGICAL: No headache, dizziness, syncope, paralysis, ataxia, numbness or tingling in the extremities. No change in bowel or bladder control.  MUSCULOSKELETAL: No muscle, back pain, joint pain or stiffness.  LYMPHATICS: No enlarged nodes. No history of splenectomy.  PSYCHIATRIC: No history of depression or anxiety.  ENDOCRINOLOGIC: No reports of sweating, cold or heat intolerance. No polyuria or polydipsia.  Marland Kitchen   Physical Examination Today's Vitals   12/28/21 1426  BP: 116/72  Pulse: 67  SpO2: 97%  Weight: 214 lb (97.1 kg)  Height: '5\' 10"'$  (1.778 m)   Body mass index is 30.71 kg/m.  Gen: resting comfortably, no acute distress HEENT: no scleral icterus, pupils equal round and reactive, no palptable cervical adenopathy,  CV: RRR, no m/r/g no jvd Resp: Clear to auscultation bilaterally GI: abdomen is soft, non-tender, non-distended, normal bowel sounds, no hepatosplenomegaly MSK: extremities are warm, no edema.  Skin: warm, no rash Neuro:  no focal deficits Psych: appropriate affect   Diagnostic Studies  02/2018 echo Study Conclusions   - Left ventricle: The cavity size was normal. Wall thickness was    increased increased in a pattern of mild to moderate  LVH.    Systolic function was normal. The estimated ejection fraction was    in the range of 60% to 65%. Wall motion was normal; there were no    regional wall motion abnormalities. Doppler parameters are    consistent with abnormal left ventricular relaxation (grade 1    diastolic dysfunction).  - Aortic valve: Moderately calcified annulus. Trileaflet; mildly    calcified leaflets. There was mild regurgitation.  - Mitral valve: Mildly calcified annulus. There was trivial    regurgitation.  - Right atrium: Central venous pressure (est): 3 mm Hg.  - Atrial septum:  No defect or patent foramen ovale was identified.  - Tricuspid valve: There was trivial regurgitation.  - Pulmonary arteries: Systolic pressure could not be accurately    estimated.  - Pericardium, extracardiac: There was no pericardial effusion.      02/2018 GXT Nondiagnostic exercise treadmill test for ischemia due to blunted heart rate response, however consistent with chronotropic incompetence with patient achieving only 57% of MPHR. Test discontinued due to patient fatigue and shortness of breath. Blood pressure demonstrated a normal response to exercise.       03/2016 nuclear stress There was no ST segment deviation noted during stress. The study is normal. No ischemia or scar. This is a low risk study. Nuclear stress EF: 70%.     08/2016 PFTs Minimal diffusion defect, no obstruction      Assessment and Plan   1. Pacemaker/symptomatic bradycardia/sinus bradycardia - no symptoms, recent normal device check with repeat later this month - continue to monitor     2. DOE - extensive workup over the last few years fairly benign - likely significant component of deconditioning, recommedned more regular exercise - chrnoic stable symptoms, continue to monitor at this time - can lower lasix to '20mg'$  daily, take additional '20mg'$  prn. Isolated low bp at his pcp office recently perhaps related.      Arnoldo Lenis, M.D.

## 2022-01-03 ENCOUNTER — Ambulatory Visit (INDEPENDENT_AMBULATORY_CARE_PROVIDER_SITE_OTHER): Payer: Medicare Other

## 2022-01-03 DIAGNOSIS — I495 Sick sinus syndrome: Secondary | ICD-10-CM

## 2022-01-03 LAB — CUP PACEART REMOTE DEVICE CHECK
Battery Remaining Percentage: 70 %
Brady Statistic RA Percent Paced: 93 %
Brady Statistic RV Percent Paced: 14 %
Date Time Interrogation Session: 20230927081521
Implantable Lead Implant Date: 20191216
Implantable Lead Implant Date: 20191216
Implantable Lead Location: 753859
Implantable Lead Location: 753860
Implantable Lead Model: 377
Implantable Lead Model: 377
Implantable Lead Serial Number: 80840322
Implantable Lead Serial Number: 80924555
Implantable Pulse Generator Implant Date: 20191216
Lead Channel Impedance Value: 449 Ohm
Lead Channel Impedance Value: 546 Ohm
Lead Channel Pacing Threshold Amplitude: 1.1 V
Lead Channel Pacing Threshold Pulse Width: 0.4 ms
Lead Channel Sensing Intrinsic Amplitude: 0.9 mV
Lead Channel Sensing Intrinsic Amplitude: 5.7 mV
Lead Channel Setting Pacing Amplitude: 2 V
Lead Channel Setting Pacing Amplitude: 2.4 V
Lead Channel Setting Pacing Pulse Width: 0.4 ms
Pulse Gen Model: 407145
Pulse Gen Serial Number: 69486427

## 2022-01-09 ENCOUNTER — Other Ambulatory Visit: Payer: Self-pay | Admitting: Family Medicine

## 2022-01-10 IMAGING — DX DG CHEST 2V
2 series · 2 of 2 positions shown · non-contrast
Comparison: June 27, 2020

CLINICAL DATA: Shortness of breath.

EXAM:
CHEST - 2 VIEW

[chest pa]
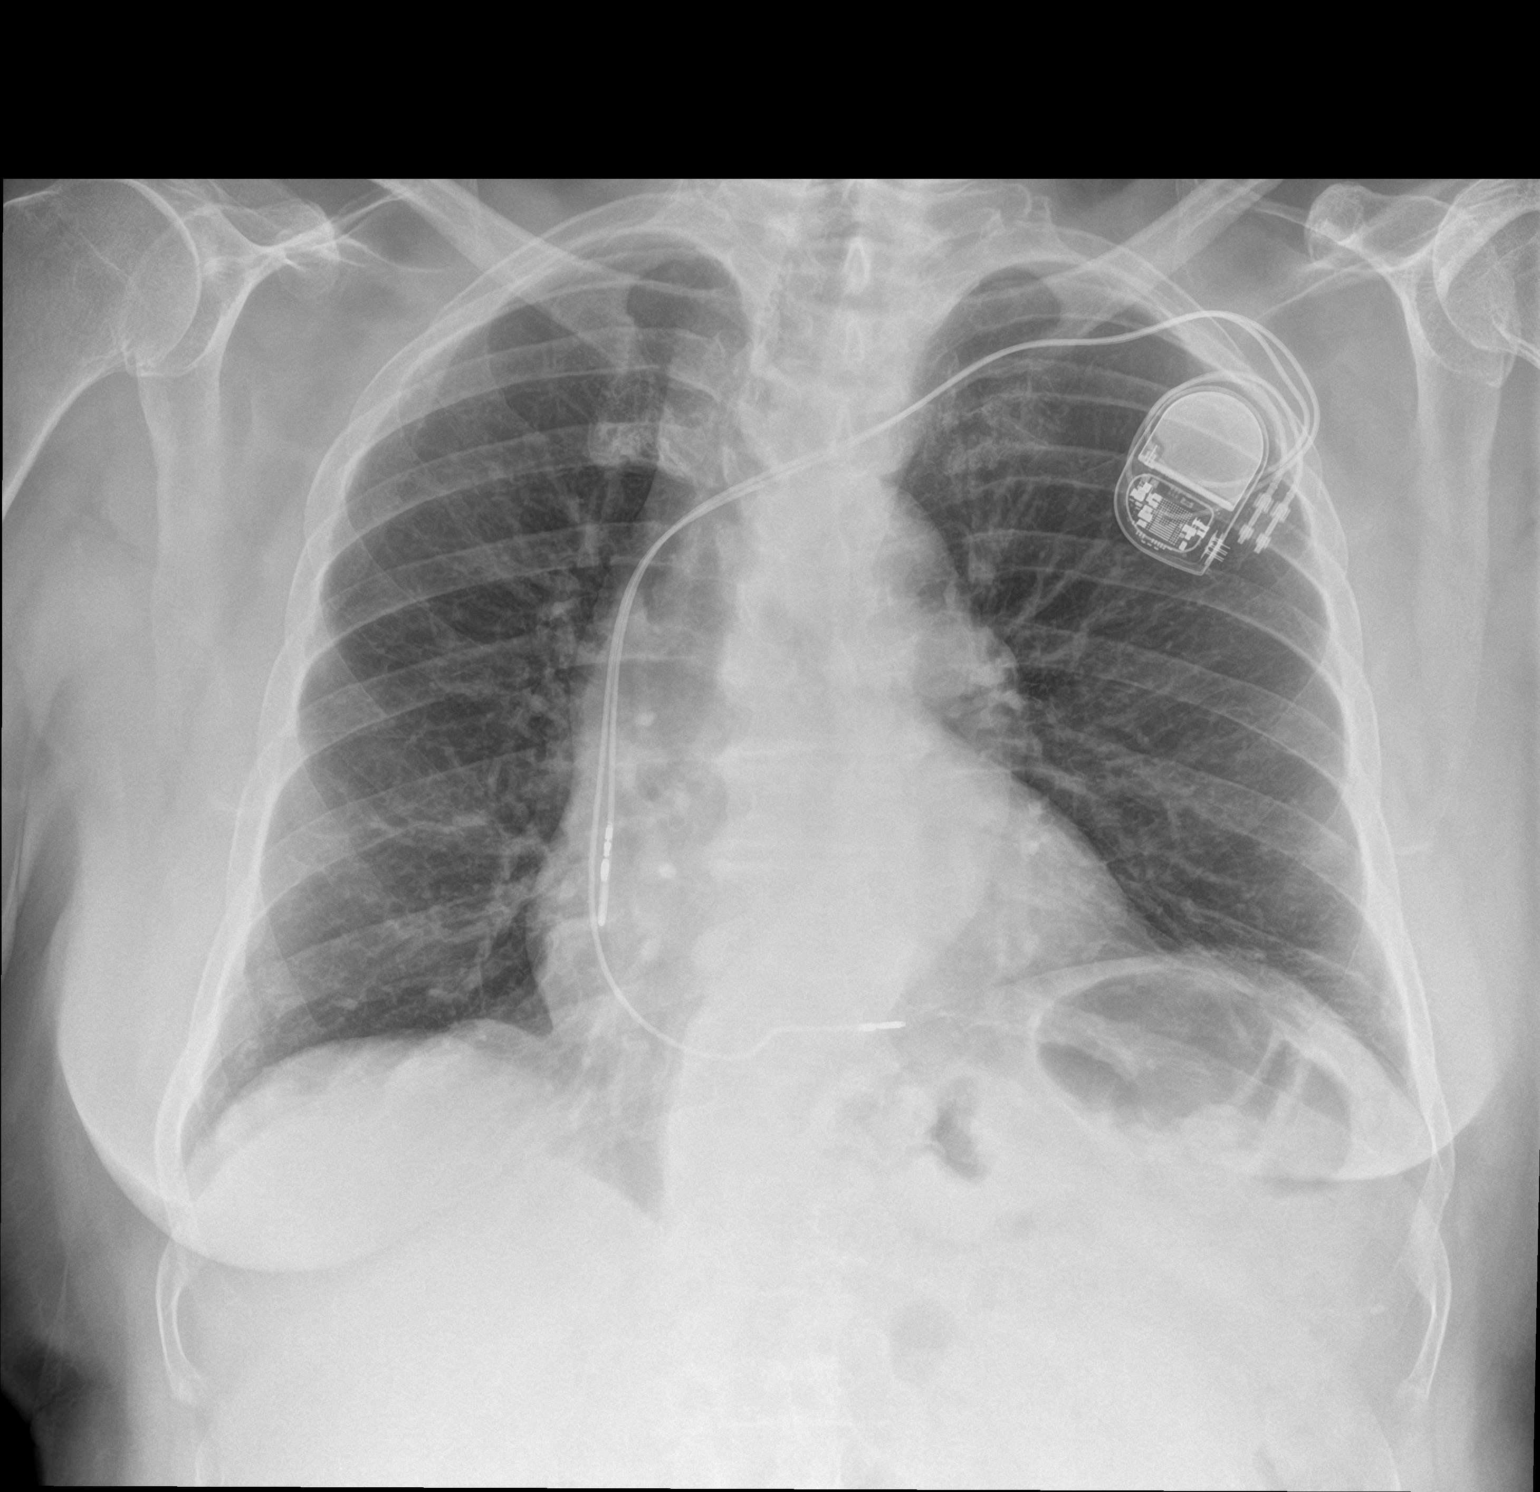

[chest lat]
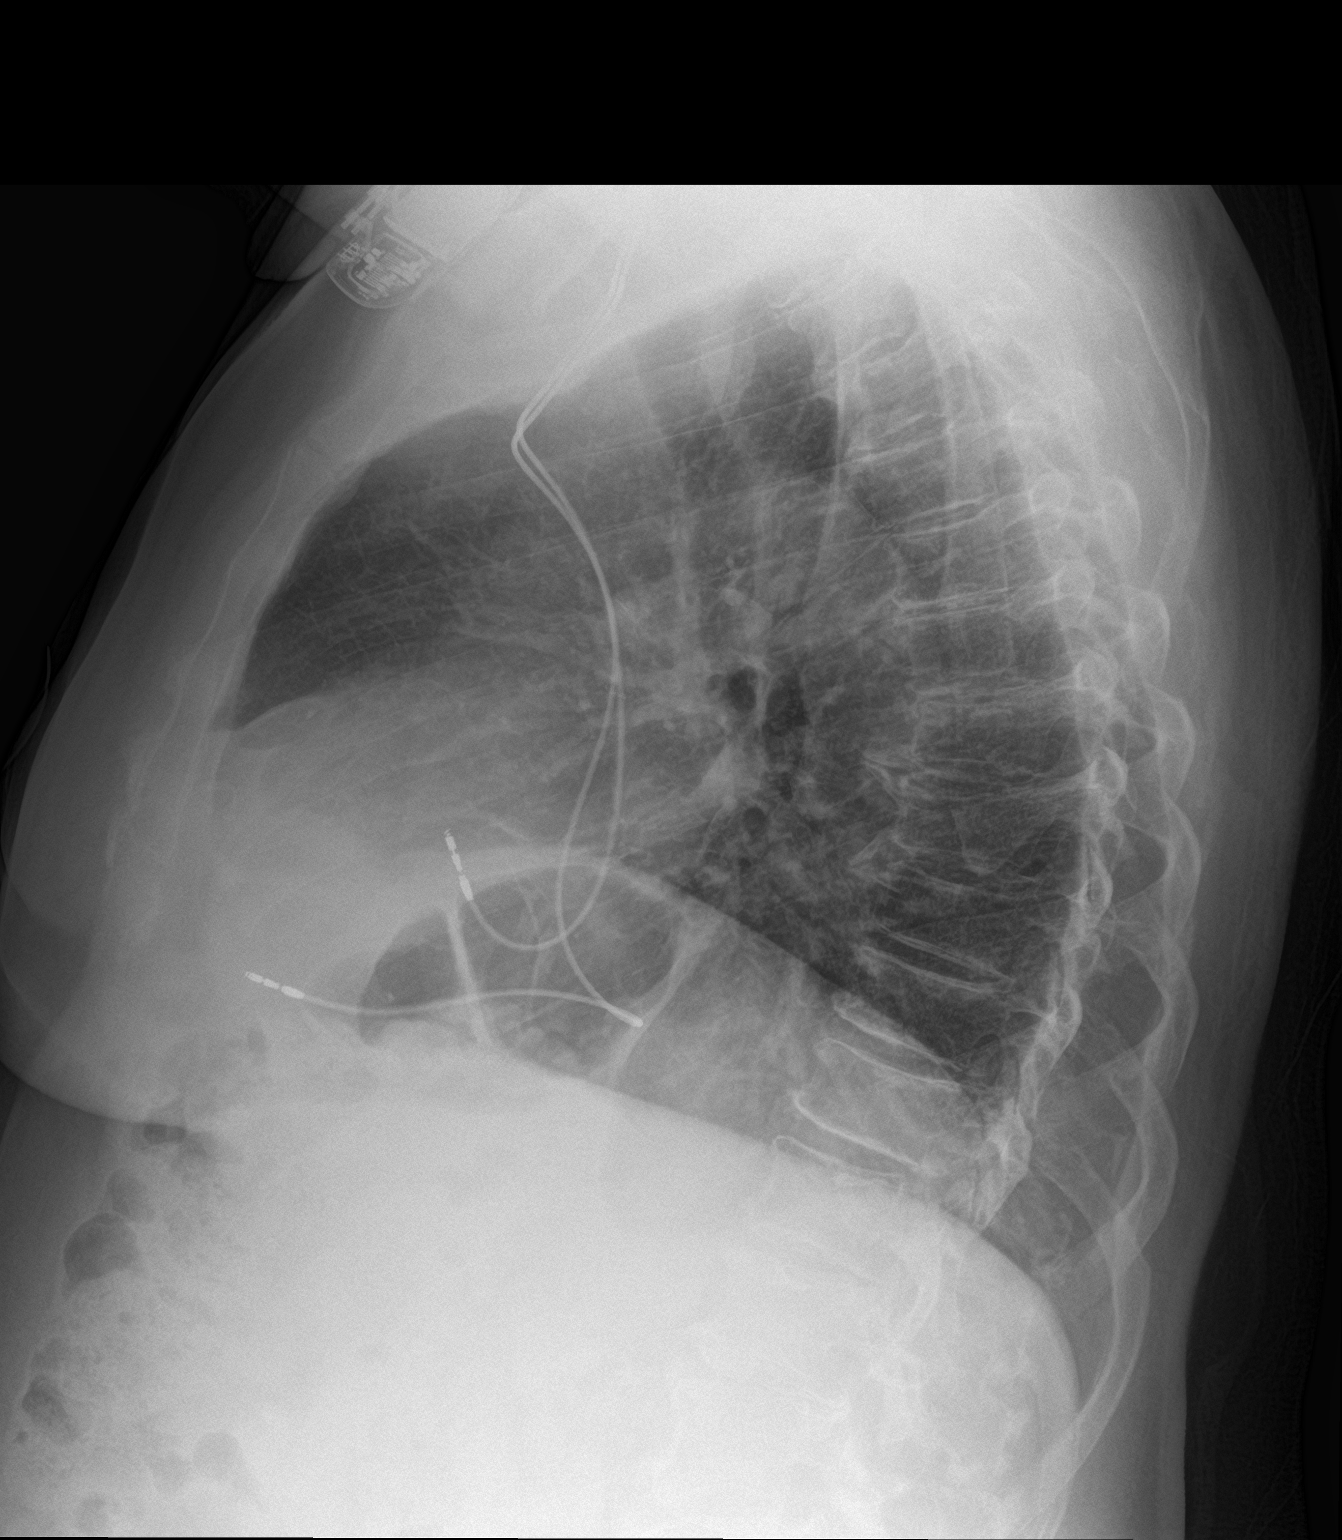

[2 of 2 positions shown; findings below may reference images not displayed]

FINDINGS: There is a dual lead AICD. The heart size and mediastinal contours
are within normal limits. Tortuosity of the descending thoracic
aorta is seen. Both lungs are clear. Degenerative changes are noted
throughout the thoracic spine.
IMPRESSION: No active cardiopulmonary disease.

## 2022-01-10 NOTE — Progress Notes (Signed)
Remote pacemaker transmission.   

## 2022-03-09 ENCOUNTER — Other Ambulatory Visit: Payer: Self-pay | Admitting: Family Medicine

## 2022-03-16 ENCOUNTER — Ambulatory Visit (INDEPENDENT_AMBULATORY_CARE_PROVIDER_SITE_OTHER): Payer: Medicare Other | Admitting: Internal Medicine

## 2022-03-16 ENCOUNTER — Encounter: Payer: Self-pay | Admitting: Internal Medicine

## 2022-03-16 VITALS — BP 132/84 | HR 68 | Temp 98.2°F | Ht 70.0 in | Wt 215.0 lb

## 2022-03-16 DIAGNOSIS — J4530 Mild persistent asthma, uncomplicated: Secondary | ICD-10-CM

## 2022-03-16 MED ORDER — BUDESONIDE-FORMOTEROL FUMARATE 160-4.5 MCG/ACT IN AERO: INHALATION_SPRAY | RESPIRATORY_TRACT | 12 refills | Status: DC

## 2022-03-16 NOTE — Patient Instructions (Addendum)
Generic  Symbicort 160 Take 2 puffs first thing in am and then another 2 puffs about 12 hours later.     Only use your albuterol as a rescue medication to be used if you can't catch your breath by resting or doing a relaxed purse lip breathing pattern.  - The less you use it, the better it will work when you need it. - Ok to use up to 2 puffs  every 4 hours if you must but call for immediate appointment if use goes up over your usual need - Don't leave home without it !!  (think of it like the spare tire for your car)   Also  Ok to try albuterol 15 min before an activity (on alternating days)  that you know would usually make you short of breath and see if it makes any difference and if makes none then don't take albuterol after activity unless you can't catch your breath as this means it's the resting that helps, not the albuterol.      Please schedule a follow up visit in 6 months but call sooner if needed

## 2022-03-16 NOTE — Assessment & Plan Note (Signed)
Onset ? Around 2012 at wt = 246  -  PFTs 09/05/16 FVC 2.90 (84%), FEV1 2.32 (92%), F/F 80, TLC 82%, DLCO 79% Minimal reduction in diffusion capacity. - 05/09/2021   Walked on Ra  x  one  lap(s) =  approx 150  ft  @ slow pace, stopped due to fatigue and sob with lowest 02 sats 97%  - 06/21/2021   Walked on RA  x  3  lap(s) =  approx 450  ft  @ fast pace, stopped due to end of study min sob with lowest 02 sats 96%  - 12/18/2021  After extensive coaching inhaler device,  effectiveness =    50%  > continue symbicort 160 - 03/16/2022  The proper method of use, as well as anticipated side effects, of a metered-dose inhaler were discussed and demonstrated to the patient using teach back method. Improved effectiveness after extensive coaching during this visit to a level of approximately 75 % from a baseline of 50 % (early trigger) > changed to  generic symb 160  - 03/16/2022   Walked on RA  x  3  lap(s) =  approx 450  ft  @ mod pace, stopped due to end of study  with lowest 02 sats 97% no sob    Despite suboptimal sob, All goals of chronic asthma control met including optimal function and elimination of symptoms with minimal need for rescue therapy.  Contingencies discussed in full including contacting this office immediately if not controlling the symptoms using the rule of two's.     Reviewed approp saba use in this setting: Re SABA :  I spent extra time with pt today reviewing appropriate use of albuterol for prn use on exertion with the following points: 1) saba is for relief of sob that does not improve by walking a slower pace or resting but rather if the pt does not improve after trying this first. 2) If the pt is convinced, as many are, that saba helps recover from activity faster then it's easy to tell if this is the case by re-challenging : ie stop, take the inhaler, then p 5 minutes try the exact same activity (intensity of workload) that just caused the symptoms and see if they are substantially  diminished or not after saba 3) if there is an activity that reproducibly causes the symptoms, try the saba 15 min before the activity on alternate days   If in fact the saba really does help, then fine to continue to use it prn but advised may need to look closer at the maintenance regimen being used to achieve better control of airways disease with exertion.   F/u 6 m, sooner prn   Each maintenance medication was reviewed in detail including emphasizing most importantly the difference between maintenance and prns and under what circumstances the prns are to be triggered using an action plan format where appropriate.  Total time for H and P, chart review, counseling, reviewing hfa device(s) , directly observing portions of ambulatory 02 saturation study/ and generating customized AVS unique to this office visit / same day charting = 34 min

## 2022-03-16 NOTE — Progress Notes (Addendum)
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  FENO 09/05/16- 54  Labs: CBC with diff 09/05/16- WBC 8.2, 5.4% eos, absolute eso count 460 Blood allergy screen  09/05/16- negative RAST, IgE 150  Cardiac: Echo 03/05/2018 LVH, EF 60 to 25%, grade 1 diastolic dysfunction      History of Present Illness  05/09/2021  Pulmonary/ 1st office eval/ Ovida Delagarza / [place] Office re asthma maint on symbicort 160 with poor techique  Chief Complaint  Patient presents with   New Patient (Initial Visit)    Has seen Dr. Vaughan Browner in the past is switching to [place] office for location.   Concerns about breathing   Using symbicort 2 puffs in am and 2 puffs in pm. Wants to discuss different inhaler costs.   Dyspnea:  walking about 50 ft and stop x sev years  Cough: not a problem  Sleep: bed is flat/ on side s am flare but sometimes wheeze per wife  SABA use:  several times a week but never prechallenges  Rec Continue omeprazole 40 mg Take 30-60 min before first meal of the day  Add pepcid 20 mg after supper  Work on inhaler technique:   Ok to try albuterol 15 min before an activity (on alternating days)  that you know would usually make you short of breath GERD diet reviewed, bed blocks rec  Please schedule a follow up office visit in 6 weeks, call sooner if needed with all medications /inhalers/ solutions in hand     12/18/2021  f/u ov/[place] office/Kamariyah Timberlake re: asthma maint on symbicort 160  2bid with suboptimal hfa Chief Complaint  Patient presents with   Follow-up    Feels breathing is getting better    Dyspnea:  easier than it was to do adls but not very  active Cough: none  Sleeping: on back/ props up on  pillows and sleepis fine SABA use: rarely now  02: none  Rec Plan A = Automatic = Always=    Symbicort  160 Take 2 puffs first thing in am and then another 2 puffs about 12 hours later.   Work on inhaler technique:     - remember how golfers take practice swings - use your empty symbicort for training before each use  Plan B = Backup (to supplement plan A, not to replace it) Only use your albuterol inhaler as a rescue medication Please schedule a follow up visit in 3 months but call sooner if needed- bring your inhalers     03/16/2022  f/u ov/[place] office/Mylan Lengyel re: asthma maint on symbicort 160  with moderate hfa effectiveness  Chief Complaint  Patient presents with   Follow-up    Better since last ov  Needs to discuss inhalers. Letter from insurance stating inhaler wont be covered in 2024 and need to change to generic breo or advair   Dyspnea:  across the parking lot slowly  - does better pushing grocery cart  Cough: none  Sleeping: props up with bed blocks  SABA use:  one -two times  02: none   No obvious day to day or daytime variability or  assoc excess/ purulent sputum or mucus plugs or hemoptysis or cp or chest tightness, subjective wheeze or overt sinus or hb symptoms.   Sleeping as above without nocturnal  or early am exacerbation  of respiratory  c/o's or need for noct saba. Also denies any obvious fluctuation of symptoms with weather or environmental changes or other aggravating or alleviating factors except as outlined above   No unusual exposure hx or h/o childhood pna/ asthma or knowledge of premature birth.  Current Allergies, Complete Past Medical History, Past Surgical History, Family History, and Social History were reviewed in [employer] record.  ROS  The following are not active complaints unless bolded Hoarseness, sore throat, dysphagia, dental problems, itching, sneezing,  nasal  congestion or discharge of excess mucus or purulent secretions, ear ache,   fever, chills, sweats, unintended wt loss or wt gain, classically pleuritic or exertional cp,  orthopnea pnd or arm/hand swelling  or leg swelling, presyncope, palpitations, abdominal pain, anorexia, nausea, vomiting, diarrhea  or change in bowel habits or change in bladder habits, change in stools or change in urine, dysuria, hematuria,  rash, arthralgias, visual complaints, headache, numbness, weakness or ataxia or problems with walking or coordination,  change in mood or  memory.        Current Meds  Medication Sig   albuterol (VENTOLIN HFA) 108 (90 Base) MCG/ACT inhaler INHALE 2 PUFFS INTO THE LUNGS EVERY 4 (FOUR) HOURS AS NEEDED FOR WHEEZING OR SHORTNESS OF BREATH   aspirin 81 MG EC tablet Take 81 mg by mouth at bedtime.   atorvastatin (LIPITOR) 40 MG tablet Take 1 tablet (40 mg total) by mouth at bedtime.   benzonatate (TESSALON) 100 MG capsule Take 1 capsule (100 mg total) by mouth 2 (two) times daily as needed for cough.   budesonide-formoterol (SYMBICORT) 160-4.5 MCG/ACT inhaler INHALE 2 PUFFS INTO THE LUNGS IN THE MORNING AND AT BEDTIME.   calcium-vitamin D (OSCAL WITH D) 500-200 MG-UNIT tablet Take 1 tablet by mouth 2 (two) times daily.   carboxymethylcellulose (REFRESH PLUS) 0.5 % SOLN INSTILL 1 DROP IN EACH EYE FOUR TIMES A DAY   dorzolamide-timolol (COSOPT) 22.3-6.8 MG/ML ophthalmic solution INSTILL 1 DROP IN [facility] EYE TWO TIMES A DAY   famotidine (PEPCID) 20 MG tablet One after supper   furosemide (LASIX) 20 MG tablet Take 1 tablet by mouth daily. May take an additional tablet for fluid/swelling.   latanoprost (XALATAN) 0.005 % ophthalmic solution Place 1 drop into both eyes at bedtime.   montelukast (SINGULAIR) 10 MG tablet TAKE 1 TABLET BY MOUTH EVERYDAY AT BEDTIME   Multiple Vitamin (MULTIVITAMIN WITH MINERALS) TABS tablet Take 1 tablet by mouth at bedtime.   omeprazole (PRILOSEC) 40 MG capsule TAKE 1  CAPSULE (40 MG TOTAL) BY MOUTH DAILY.   sertraline (ZOLOFT) 25 MG tablet Take 25 mg by mouth daily.   tamsulosin (FLOMAX) 0.4 MG CAPS capsule Take 1 capsule (0.4 mg total) by mouth daily.   venlafaxine XR (EFFEXOR-XR) 150 MG 24 hr capsule Take 1 capsule by mouth every morning.   vitamin C (ASCORBIC ACID) 500 MG tablet Take 500 mg by mouth at bedtime.    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   Obesity    Seasonal allergies         Objective:    Wts  03/16/2022       215  12/18/2021       215   06/21/21 217 lb 6.4 oz (98.6 kg)  06/16/21 218 lb (98.9 kg)  06/14/21 219 lb (99.3 kg)     Vital signs reviewed  03/16/2022  - Note at rest 02 sats  96% on RA   General appearance:    well dressed amb bm, very hard of hearing       HEENT : Oropharynx  clear/ full dentures         NECK :  without  apparent JVD/ palpable Nodes/TM    LUNGS: no acc muscle use,  Nl contour chest which is clear to A and P bilaterally without cough on insp or exp maneuvers   CV:  RRR  no s3 or murmur or increase in P2, and no edema   ABD:  soft and nontender with nl inspiratory excursion in the supine position. No bruits or organomegaly appreciated   MS:  Nl gait/ ext warm without deformities Or obvious joint restrictions  calf tenderness, cyanosis or clubbing    SKIN: warm and dry without lesions    NEURO:  alert, approp, nl sensorium with  no motor or cerebellar deficits apparent. Slt resting tremor           Assessment

## 2022-03-19 ENCOUNTER — Ambulatory Visit (INDEPENDENT_AMBULATORY_CARE_PROVIDER_SITE_OTHER): Payer: Medicare Other

## 2022-03-19 VITALS — BP 127/74 | HR 67 | Temp 97.3°F | Ht 70.0 in | Wt 205.0 lb

## 2022-03-19 DIAGNOSIS — Z0000 Encounter for general adult medical examination without abnormal findings: Secondary | ICD-10-CM

## 2022-03-19 NOTE — Progress Notes (Signed)
Subjective:   Terry Love is a 86 y.o. male who presents for Medicare Annual/Subsequent preventive examination.  Review of Systems    I connected with  Terry Love on 03/19/22 by a audio enabled telemedicine application and verified that I am speaking with the correct person using two identifiers.  Patient Location: Home  Provider Location: Office/Clinic  I discussed the limitations of evaluation and management by telemedicine. The patient expressed understanding and agreed to proceed.        Objective:    There were no vitals filed for this visit. There is no height or weight on file to calculate BMI.     06/01/2021    9:19 AM 04/17/2021    2:58 PM 02/07/2021    9:07 AM 02/02/2020    8:30 AM 03/24/2018    7:45 PM 03/04/2018    6:43 PM 03/04/2018    2:12 PM  Advanced Directives  Does Patient Have a Medical Advance Directive? Yes Yes Yes No;Yes No No No  Type of Paramedic of [place];Living will  [place] will     Does patient want to make changes to medical advance directive?  No - Patient declined Yes (MAU/Ambulatory/Procedural Areas - Information given) Yes (MAU/Ambulatory/Procedural Areas - Information given)     Copy of [place] in Chart?   No - copy requested      Would patient like information on creating a medical advance directive?   Yes (MAU/Ambulatory/Procedural Areas - Information given) No - Patient declined No - Patient declined No - Patient declined     Current Medications (verified) Outpatient Encounter Medications as of 03/19/2022  Medication Sig   albuterol (VENTOLIN HFA) 108 (90 Base) MCG/ACT inhaler INHALE 2 PUFFS INTO THE LUNGS EVERY 4 (FOUR) HOURS AS NEEDED FOR WHEEZING OR SHORTNESS OF BREATH   aspirin 81 MG EC tablet Take 81 mg by mouth at bedtime.   atorvastatin (LIPITOR) 40 MG tablet Take 1 tablet (40 mg total) by mouth at bedtime.   benzonatate (TESSALON) 100 MG capsule  Take 1 capsule (100 mg total) by mouth 2 (two) times daily as needed for cough.   budesonide-formoterol (SYMBICORT) 160-4.5 MCG/ACT inhaler INHALE 2 PUFFS INTO THE LUNGS IN THE MORNING AND AT BEDTIME.   budesonide-formoterol (SYMBICORT) 160-4.5 MCG/ACT inhaler Take 2 puffs first thing in am and then another 2 puffs about 12 hours later.   calcium-vitamin D (OSCAL WITH D) 500-200 MG-UNIT tablet Take 1 tablet by mouth 2 (two) times daily.   carboxymethylcellulose (REFRESH PLUS) 0.5 % SOLN INSTILL 1 DROP IN EACH EYE FOUR TIMES A DAY   dorzolamide-timolol (COSOPT) 22.3-6.8 MG/ML ophthalmic solution INSTILL 1 DROP IN [facility] EYE TWO TIMES A DAY   famotidine (PEPCID) 20 MG tablet One after supper   furosemide (LASIX) 20 MG tablet Take 1 tablet by mouth daily. May take an additional tablet for fluid/swelling.   latanoprost (XALATAN) 0.005 % ophthalmic solution Place 1 drop into both eyes at bedtime.   montelukast (SINGULAIR) 10 MG tablet TAKE 1 TABLET BY MOUTH EVERYDAY AT BEDTIME   Multiple Vitamin (MULTIVITAMIN WITH MINERALS) TABS tablet Take 1 tablet by mouth at bedtime.   omeprazole (PRILOSEC) 40 MG capsule TAKE 1 CAPSULE (40 MG TOTAL) BY MOUTH DAILY.   sertraline (ZOLOFT) 25 MG tablet Take 25 mg by mouth daily.   tamsulosin (FLOMAX) 0.4 MG CAPS capsule Take 1 capsule (0.4 mg total) by mouth daily.   venlafaxine XR (EFFEXOR-XR)  150 MG 24 hr capsule Take 1 capsule by mouth every morning.   vitamin C (ASCORBIC ACID) 500 MG tablet Take 500 mg by mouth at bedtime.    Vitamin D, Ergocalciferol, (DRISDOL) 1.25 MG (50000 UNIT) CAPS capsule TAKE 1 CAPSULE (50,000 UNITS TOTAL) BY MOUTH EVERY MONDAY IN THE MORNING   No facility-administered encounter medications on file as of 03/19/2022.    Allergies (verified) Patient has no known allergies.   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 Rehman:Examination performed to cecum Pan colonic diverticulosis/2 small polyps ablated via cold biopsy from transverse colon/External hemorrhoids, benign polyps   COLONOSCOPY N/A 06/29/2013   Procedure: COLONOSCOPY;  Surgeon: Daneil Dolin, MD;  [facility];  Service: Endoscopy;  Laterality: N/A;  10:45   EYE SURGERY     left eye cataracts    PACEMAKER IMPLANT N/A 03/24/2018   Procedure: PACEMAKER IMPLANT;  Surgeon: Evans Lance, MD;  Location: [place] [facility];  Service: Cardiovascular;  Laterality: N/A;   Family History  Problem Relation Age of Onset   Colon cancer Mother        diagnosed at age 55   Aneurysm Father    Aneurysm Brother    Stroke Brother    Social History   Socioeconomic History   Marital status: Married    Spouse name: Ceasar Lund    Number of children: 0   Years of education: 12   Highest education level: 12th grade  Occupational History   Occupation: retired  Tobacco Use   Smoking status: Never   Smokeless tobacco: Former    Types: Chew    Quit date: 03/13/2016  Vaping Use   Vaping Use: Never used  Substance and Sexual Activity   Alcohol use: No    Alcohol/week: 0.0 standard drinks of alcohol   Drug use: No   Sexual activity: Not Currently  Other Topics Concern   Not on file  Social History Narrative   Not on file   Social Determinants of Health   Financial Resource Strain: Low Risk  (02/07/2021)   Overall Financial Resource Strain (CARDIA)    Difficulty of Paying Living Expenses: Not hard at all  Food Insecurity: No Food Insecurity (02/07/2021)   Hunger Vital Sign    Worried About Running Out of Food in the Last Year: Never true     [place] in the Last Year: Never true  Transportation Needs: No Transportation Needs (02/07/2021)   PRAPARE - Hydrologist (Medical): No    Lack of Transportation (Non-Medical): No  Physical Activity: Insufficiently Active (02/07/2021)   Exercise Vital Sign    Days of Exercise per Week: 3 days    Minutes of Exercise per Session: 20 min  Stress: No Stress Concern Present (02/07/2021)   [place]    Feeling of  Stress : Not at all  Social Connections: Socially Integrated (02/07/2021)   Social Connection and Isolation Panel [NHANES]    Frequency of Communication with Friends and Family: More than three times a week    Frequency of Social Gatherings with Friends and Family: More than three times a week    Attends Religious Services: More than 4 times per year    Active Member of [employer] or Organizations: Yes    Attends Music therapist: More than 4 times per year    Marital Status: Married    Tobacco Counseling Counseling given: Not Answered   Clinical Intake:    Mr. Zheng , Thank you for taking time to come for your Medicare Wellness Visit. I appreciate your ongoing commitment to your health goals. Please review the following plan we discussed and let me know if I can assist you in the future.   These are the goals we discussed:  Goals       DIET - EAT MORE FRUITS AND VEGETABLES      DIET - REDUCE SUGAR INTAKE      Exercise 3x per week (30 min per time)      Recommend starting a routine exercise program at least 3 days a week for 30-45 minutes at a time as tolerated.        Patient Stated      None at this moment       Patient Stated (pt-stated)      None.      Set My Weight Loss Goal      Pt voices he would like to lose 20lbs.         This is a list of the screening recommended for you and due dates:  Health Maintenance  Topic Date Due   Medicare Annual  Wellness Visit  03/20/2023   DTaP/Tdap/Td vaccine (4 - Td or Tdap) 02/21/2024   Pneumonia Vaccine  Completed   Flu Shot  Completed   COVID-19 Vaccine  Completed   Zoster (Shingles) Vaccine  Completed   HPV Vaccine  Aged Out                 Diabetic?no         Activities of Daily Living     No data to display           Patient Care Team: Fayrene Helper, MD as PCP - General (Family Medicine) Evans Lance, MD as PCP - Electrophysiology (Cardiology) Gala Romney Cristopher Estimable, MD as Consulting Physician (Gastroenterology)  Indicate any recent Medical Services you may have received from other than [facility] providers in the past year (date may be approximate).     Assessment:   This is a routine wellness examination for [place].  Hearing/Vision screen No results found.  Dietary issues and exercise activities discussed:     Goals Addressed   None   Depression Screen    12/12/2021    1:07 PM 06/14/2021    8:28 AM 02/23/2021    1:05 PM 02/07/2021    9:08 AM 02/07/2021    9:06 AM 12/09/2020    1:44 PM 10/19/2020    8:59 AM  PHQ 2/9 Scores  PHQ - 2 Score '1 1 5 '$ 0 0 2 2  PHQ- 9 Score   '18   3 8    '$ Fall Risk    12/12/2021    1:07 PM 06/14/2021    8:28 AM 04/26/2021    1:49 PM 02/23/2021  1:05 PM 02/07/2021    9:08 AM  Fall Risk   Falls in the past year? 0 1 0 0 0  Number falls in past yr: 0 0 0 0 0  Injury with Fall? 0 1 0 0 0  Risk for fall due to : No Fall Risks Impaired balance/gait Impaired balance/gait  No Fall Risks  Follow up Falls evaluation completed Falls evaluation completed   Falls evaluation completed    [place]:  Any stairs in or around the home? No  If so, are there any without handrails? N/a Home free of loose throw rugs in walkways, pet beds, electrical cords, etc? Yes  Adequate lighting in your home to reduce risk of falls? Yes   ASSISTIVE DEVICES UTILIZED TO PREVENT FALLS:  Life alert? No  Use of a cane,  walker or w/c? Yes  Grab bars in the bathroom? Yes  Shower chair or bench in shower? Yes  Elevated toilet seat or a handicapped toilet? Yes       02/07/2021    9:08 AM  MMSE - Mini Mental State Exam  Not completed: Unable to complete        02/07/2021    9:09 AM 02/02/2020    8:32 AM 01/30/2019   10:19 AM 01/27/2018   11:23 AM 08/27/2016   11:37 AM  6CIT Screen  What Year? 0 points 0 points 0 points 0 points 0 points  What month? 0 points 0 points 0 points 0 points 0 points  What time? 0 points 0 points 0 points 0 points 0 points  Count back from 20 0 points 0 points 0 points 0 points 0 points  Months in reverse 0 points 0 points 0 points 0 points 0 points  Repeat phrase 0 points 0 points 0 points 0 points 0 points  Total Score 0 points 0 points 0 points 0 points 0 points    Immunizations Immunization History  Administered Date(s) Administered   Fluad Quad(high Dose 65+) 11/27/2018, 12/20/2021   Influenza Split 01/01/2013, 01/01/2014, 01/03/2018   Influenza Whole 01/06/2007, 01/04/2009, 12/23/2009, 12/14/2010   Influenza, High Dose Seasonal PF 01/03/2018   Influenza,inj,Quad PF,6+ Mos 12/14/2015, 12/03/2016, 01/28/2017, 12/01/2019   Influenza-Unspecified 01/31/2017, 12/07/2019, 01/07/2021, 01/19/2021   Moderna Covid-19 Vaccine Bivalent Booster 21yr & up 03/14/2021   Moderna Sars-Covid-2 Vaccination 04/22/2019, 05/20/2019, 02/01/2020, 07/19/2020, 03/14/2021   Pneumococcal Conjugate-13 10/13/2013, 03/21/2015   Pneumococcal Polysaccharide-23 02/21/2004, 09/25/2016   Pneumococcal-Unspecified 01/07/2006   Td 12/23/2009   Tdap 04/04/2012, 02/20/2014   Zoster Recombinat (Shingrix) 01/10/2017, 03/11/2017   Zoster, Live 01/06/2007    TDAP status: Up to date  Flu Vaccine status: Up to date  Pneumococcal vaccine status: Up to date  Covid-19 vaccine status: Completed vaccines  Qualifies for Shingles Vaccine? Yes   Zostavax completed Yes   Shingrix Completed?:  Yes  Screening Tests Health Maintenance  Topic Date Due   Medicare Annual Wellness (AWV)  03/20/2023   DTaP/Tdap/Td (4 - Td or Tdap) 02/21/2024   Pneumonia Vaccine 86 Years old  Completed   INFLUENZA VACCINE  Completed   COVID-19 Vaccine  Completed   Zoster Vaccines- Shingrix  Completed   HPV VACCINES  Aged Out    Health Maintenance  There are no preventive care reminders to display for this patient.   Colorectal cancer screening: No longer required.   Lung Cancer Screening: (Low Dose CT Chest recommended if Age 38-80years, 30 pack-year currently smoking OR have quit w/in 15years.)  does not qualify.   Lung Cancer Screening Referral: No  Additional Screening:  Hepatitis C Screening: does qualify; Completed   Vision Screening: Recommended annual ophthalmology exams for early detection of glaucoma and other disorders of the eye. Is the patient up to date with their annual eye exam?  Yes  Who is the provider or what is the name of the office in which the patient attends annual eye exams? N/a If pt is not established with a provider, would they like to be referred to a provider to establish care? No .   Dental Screening: Recommended annual dental exams for proper oral hygiene  Community Resource Referral / Chronic Care Management: CRR required this visit?  No   CCM required this visit?  No      Plan:     I have personally reviewed and noted the following in the patient's chart:   Medical and social history Use of alcohol, tobacco or illicit drugs  Current medications and supplements including opioid prescriptions. Patient is not currently taking opioid prescriptions. Functional ability and status Nutritional status Physical activity Advanced directives List of other physicians Hospitalizations, surgeries, and ER visits in previous 12 months Vitals Screenings to include cognitive, depression, and falls Referrals and appointments  In addition, I have reviewed  and discussed with patient certain preventive protocols, quality metrics, and best practice recommendations. A written personalized care plan for preventive services as well as general preventive health recommendations were provided to patient.     Quentin Angst, [place]   03/19/2022

## 2022-04-04 ENCOUNTER — Ambulatory Visit (INDEPENDENT_AMBULATORY_CARE_PROVIDER_SITE_OTHER): Payer: Medicare Other

## 2022-04-04 DIAGNOSIS — I495 Sick sinus syndrome: Secondary | ICD-10-CM

## 2022-04-05 LAB — CUP PACEART REMOTE DEVICE CHECK
Battery Remaining Percentage: 70 %
Date Time Interrogation Session: 20231224080027
Implantable Lead Connection Status: 753985
Implantable Lead Connection Status: 753985
Implantable Lead Implant Date: 20191216
Implantable Lead Implant Date: 20191216
Implantable Lead Location: 753859
Implantable Lead Location: 753860
Implantable Lead Model: 377
Implantable Lead Model: 377
Implantable Lead Serial Number: 80840322
Implantable Lead Serial Number: 80924555
Implantable Pulse Generator Implant Date: 20191216
Lead Channel Impedance Value: 468 Ohm
Lead Channel Impedance Value: 546 Ohm
Lead Channel Pacing Threshold Amplitude: 1.1 V
Lead Channel Pacing Threshold Pulse Width: 0.4 ms
Lead Channel Sensing Intrinsic Amplitude: 0.9 mV
Lead Channel Sensing Intrinsic Amplitude: 5.7 mV
Lead Channel Setting Pacing Amplitude: 2 V
Lead Channel Setting Pacing Amplitude: 2.4 V
Lead Channel Setting Pacing Pulse Width: 0.4 ms
Pulse Gen Model: 407145
Pulse Gen Serial Number: 69486427

## 2022-04-09 ENCOUNTER — Other Ambulatory Visit: Payer: Self-pay | Admitting: Student

## 2022-04-24 NOTE — Progress Notes (Signed)
Remote pacemaker transmission.   

## 2022-04-30 ENCOUNTER — Other Ambulatory Visit: Payer: Self-pay | Admitting: Internal Medicine

## 2022-05-07 ENCOUNTER — Other Ambulatory Visit: Payer: Self-pay | Admitting: Family Medicine

## 2022-05-07 DIAGNOSIS — E785 Hyperlipidemia, unspecified: Secondary | ICD-10-CM

## 2022-05-07 DIAGNOSIS — E669 Obesity, unspecified: Secondary | ICD-10-CM

## 2022-05-08 LAB — LIPID PANEL
Chol/HDL Ratio: 2.7 ratio (ref 0.0–5.0)
Cholesterol, Total: 146 mg/dL (ref 100–199)
HDL: 54 mg/dL (ref >39)
LDL Chol Calc (NIH): 80 mg/dL (ref 0–99)
Triglycerides: 60 mg/dL (ref 0–149)
VLDL Cholesterol Cal: 12 mg/dL (ref 5–40)

## 2022-05-08 LAB — CMP14+EGFR
ALT: 18 IU/L (ref 0–44)
AST: 24 IU/L (ref 0–40)
Albumin/Globulin Ratio: 1.8 (ref 1.2–2.2)
Albumin: 4.4 g/dL (ref 3.7–4.7)
Alkaline Phosphatase: 68 IU/L (ref 44–121)
BUN/Creatinine Ratio: 17 (ref 10–24)
BUN: 14 mg/dL (ref 8–27)
Bilirubin Total: 0.4 mg/dL (ref 0.0–1.2)
CO2: 24 mmol/L (ref 20–29)
Calcium: 9.5 mg/dL (ref 8.6–10.2)
Chloride: 104 mmol/L (ref 96–106)
Creatinine, Ser: 0.81 mg/dL (ref 0.76–1.27)
Globulin, Total: 2.5 g/dL (ref 1.5–4.5)
Glucose: 108 mg/dL — ABNORMAL HIGH (ref 70–99)
Potassium: 4.7 mmol/L (ref 3.5–5.2)
Sodium: 143 mmol/L (ref 134–144)
Total Protein: 6.9 g/dL (ref 6.0–8.5)
eGFR: 87 mL/min/{1.73_m2} (ref >59)

## 2022-05-15 ENCOUNTER — Encounter: Payer: Self-pay | Admitting: Family Medicine

## 2022-05-15 ENCOUNTER — Ambulatory Visit (INDEPENDENT_AMBULATORY_CARE_PROVIDER_SITE_OTHER): Payer: Medicare Other | Admitting: Family Medicine

## 2022-05-15 VITALS — BP 115/68 | HR 69 | Ht 70.0 in | Wt 212.0 lb

## 2022-05-15 DIAGNOSIS — F322 Major depressive disorder, single episode, severe without psychotic features: Secondary | ICD-10-CM

## 2022-05-15 DIAGNOSIS — F411 Generalized anxiety disorder: Secondary | ICD-10-CM | POA: Diagnosis not present

## 2022-05-15 DIAGNOSIS — R251 Tremor, unspecified: Secondary | ICD-10-CM | POA: Diagnosis not present

## 2022-05-15 DIAGNOSIS — J4530 Mild persistent asthma, uncomplicated: Secondary | ICD-10-CM

## 2022-05-15 DIAGNOSIS — I495 Sick sinus syndrome: Secondary | ICD-10-CM

## 2022-05-15 DIAGNOSIS — R29818 Other symptoms and signs involving the nervous system: Secondary | ICD-10-CM | POA: Diagnosis not present

## 2022-05-15 DIAGNOSIS — E669 Obesity, unspecified: Secondary | ICD-10-CM | POA: Diagnosis not present

## 2022-05-15 DIAGNOSIS — E041 Nontoxic single thyroid nodule: Secondary | ICD-10-CM | POA: Diagnosis not present

## 2022-05-15 DIAGNOSIS — R7302 Impaired glucose tolerance (oral): Secondary | ICD-10-CM

## 2022-05-15 DIAGNOSIS — E785 Hyperlipidemia, unspecified: Secondary | ICD-10-CM

## 2022-05-15 DIAGNOSIS — Z125 Encounter for screening for malignant neoplasm of prostate: Secondary | ICD-10-CM

## 2022-05-15 NOTE — Patient Instructions (Addendum)
F/U in 5 months, call if you need me sooner  Please schedule Jun f/u with Dr Melvyn Novas per his December visit note during checkout  Labs non fasting April 1 or after, cBC, tSH, pSA and HBA1C  You are being referred to Neurology in [place] to be evaluated For tremor   Blood  work is excellent  Careful not to fall  Thanks for choosing [facility], we consider it a privelige to serve you.

## 2022-05-17 ENCOUNTER — Other Ambulatory Visit (HOSPITAL_COMMUNITY): Payer: Self-pay | Admitting: Family Medicine

## 2022-05-17 DIAGNOSIS — R251 Tremor, unspecified: Secondary | ICD-10-CM

## 2022-05-20 ENCOUNTER — Encounter: Payer: Self-pay | Admitting: Family Medicine

## 2022-05-20 DIAGNOSIS — F411 Generalized anxiety disorder: Secondary | ICD-10-CM | POA: Insufficient documentation

## 2022-05-20 DIAGNOSIS — R251 Tremor, unspecified: Secondary | ICD-10-CM | POA: Insufficient documentation

## 2022-05-20 NOTE — Progress Notes (Signed)
Terry Love     MRN: [id number]      DOB: 1935-10-08   HPI Terry Love is here for follow up and re-evaluation of chronic medical conditions, medication management and review of any available recent lab and radiology data.  Preventive health is updated, specifically  Cancer screening and Immunization.   Has [place] Cardiology appt and needs Pulmonary follow up scheduled [place] to also follow with Primary Cae and Specialist Care through the [facility] C/o tremor whiuh is debilitating in that he is having difficulty holding objects, should have been followed thru [place] no wants local as process is delayed    ROS Denies recent fever or chills. Denies sinus pressure, nasal congestion, ear pain or sore throat. Denies chest congestion, productive cough or wheezing. Denies chest pains, palpitations and leg swelling Denies abdominal pain, nausea, vomiting,diarrhea or constipation.   Denies dysuria, frequency, hesitancy or incontinence. [facility], he remains at baseline, severely anxious and depressed, no suicidal or homicidal thoughts however  Denies skin break down or rash.   PE  BP 115/68 (BP Location: Right Arm, Patient Position: Sitting, Cuff Size: Large)   Pulse 69   Ht 5' 10"$  (1.778 m)   Wt 212 lb 0.6 oz (96.2 kg)   SpO2 94%   BMI 30.42 kg/m   Patient alert and oriented and in no cardiopulmonary distress.  HEENT: No facial asymmetry, EOMI,     Neck supple .  Chest: Clear to auscultation bilaterally.  CVS: S1, S2 no murmurs, no S3.Regular rate.  ABD: Soft non tender.   Ext: No edema  MS: Adequate though reduced  ROM spine, shoulders, hips and knees.  Skin: Intact, no ulcerations or rash noted.  Psych: Good eye contact, flat  affect. Memory impaired anxious and  depressed appearing.  CNS: CN 2-12 intact, power,  normal throughout.no focal deficits noted.Tremor st rest and cog wheeling   Assessment & Plan  Hyperlipidemia LDL goal  <100 Hyperlipidemia:Low fat diet discussed and encouraged.   Lipid Panel  Lab Results  Component Value Date   CHOL 146 05/07/2022   HDL 54 05/07/2022   LDLCALC 80 05/07/2022   TRIG 60 05/07/2022   CHOLHDL 2.7 05/07/2022     Controlled, no change in medication   Depression, major, single episode, severe (HCC) [facility]  Nocturia  Patient re-educated about  the importance of commitment to a  minimum of 150 minutes of exercise per week as able.  The importance of healthy food choices with portion control discussed, as well as eating regularly and within a 12 hour window most days. The need to choose "clean , green" food 50 to 75% of the time is discussed, as well as to make water the primary drink and set a goal of 64 ounces water daily.       05/15/2022    1:11 PM 03/19/2022   10:46 AM 03/16/2022   11:32 AM  Weight /BMI  Weight 212 lb 0.6 oz 205 lb 215 lb  Height 5' 10"$  (1.778 m) 5' 10"$  (1.778 m) 5' 10"$  (1.778 m)  BMI 30.42 kg/m2 29.41 kg/m2 30.85 kg/m2    Deteriortaed  GAD (generalized anxiety disorder) Treated by Psych at the [facility]  Chronic asthma, mild persistent, uncomplicated Stable managed by pulmonary, f/u needs to be scheduled  Sinus node dysfunction ([place]) Managed by Cardiology, no complications, has upcoming in office eval  Tremor of both hands Both at rest and intentional, also  note cog wheeling and parkinsonian features, has brain scan scheduled, needs Neurology eval  Thyroid nodule Check TSH

## 2022-05-20 NOTE — Assessment & Plan Note (Signed)
Check TSH 

## 2022-05-20 NOTE — Assessment & Plan Note (Signed)
Treated by Psych at the [facility]

## 2022-05-20 NOTE — Assessment & Plan Note (Signed)
Both at rest and intentional, also note cog wheeling and parkinsonian features, has brain scan scheduled, needs Neurology eval

## 2022-05-20 NOTE — Assessment & Plan Note (Signed)
Treated by Psychiatry through the [facility]

## 2022-05-20 NOTE — Assessment & Plan Note (Signed)
Managed by Cardiology, no complications, has upcoming in office eval

## 2022-05-20 NOTE — Assessment & Plan Note (Signed)
Hyperlipidemia:Low fat diet discussed and encouraged.   Lipid Panel  Lab Results  Component Value Date   CHOL 146 05/07/2022   HDL 54 05/07/2022   LDLCALC 80 05/07/2022   TRIG 60 05/07/2022   CHOLHDL 2.7 05/07/2022     Controlled, no change in medication

## 2022-05-20 NOTE — Assessment & Plan Note (Signed)
  Patient re-educated about  the importance of commitment to a  minimum of 150 minutes of exercise per week as able.  The importance of healthy food choices with portion control discussed, as well as eating regularly and within a 12 hour window most days. The need to choose "clean , green" food 50 to 75% of the time is discussed, as well as to make water the primary drink and set a goal of 64 ounces water daily.       05/15/2022    1:11 PM 03/19/2022   10:46 AM 03/16/2022   11:32 AM  Weight /BMI  Weight 212 lb 0.6 oz 205 lb 215 lb  Height '5\' 10"'$  (1.778 m) '5\' 10"'$  (1.778 m) '5\' 10"'$  (1.778 m)  BMI 30.42 kg/m2 29.41 kg/m2 30.85 kg/m2    Deteriortaed

## 2022-05-20 NOTE — Assessment & Plan Note (Signed)
Stable managed by pulmonary, f/u needs to be scheduled
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
## 2022-05-22 ENCOUNTER — Ambulatory Visit (INDEPENDENT_AMBULATORY_CARE_PROVIDER_SITE_OTHER): Payer: Medicare Other | Admitting: Neurology

## 2022-05-22 ENCOUNTER — Encounter: Payer: Self-pay | Admitting: Neurology

## 2022-05-22 VITALS — BP 118/60 | HR 73 | Ht 70.0 in | Wt 210.8 lb

## 2022-05-22 DIAGNOSIS — R251 Tremor, unspecified: Secondary | ICD-10-CM

## 2022-05-22 DIAGNOSIS — T43505A Adverse effect of unspecified antipsychotics and neuroleptics, initial encounter: Secondary | ICD-10-CM

## 2022-05-22 DIAGNOSIS — G2401 Drug induced subacute dyskinesia: Secondary | ICD-10-CM | POA: Diagnosis not present

## 2022-05-22 DIAGNOSIS — G2111 Neuroleptic induced parkinsonism: Secondary | ICD-10-CM | POA: Diagnosis not present

## 2022-05-22 NOTE — Patient Instructions (Addendum)
Talk to your prescribing provider and see if you can get off of the risperdone.  Call me with that dose as well to confirm the dose that you are taking.  As we discussed, we are going to do a DaT scan.  We discussed that this is not a diagnostic scan, but will just give [place] some information on dopamine levels in the brain.  Here is some information which may be helpful to you.  Before the Exam  Please tell the nurse, nuclear imaging technician or nuclear medicine physician if you are pregnant, nursing or have reduced liver function. Please also inform us if you have an allergy or sensitivity to iodine.  The test may be completed with those who are allergic to iodine, but may require pre-medication with other medications to help avoid reactions. If you need to cancel the examination, please give [place] at least 24 hours notice.  Before your scan, stop taking these medicines for the length of time shown: Name of Drug Stop Taking  Amoxapine 4 days before  Benztropine  Cogentin 3 days before  Bupropion (Aplenzin, Budeprion, Voxra, Wellbutrin, Zyban) 48 hours before  Buspirone 15 hours before  Citalopram 24 hours before  Cocaine 6 hours before  Escitalopram 24 hours before  Methamphetamine 24 hours before  Methylphenidate (Concerta, Metadate, Methylin, Ritalin) 20 hours before  Paroxetine 24 hours before  Selegilene 48 hours before  Sertraline 3 days before    On the Day of the Exam Drink plenty of fluids and go to the bathroom frequently (and for two days after your exam) Wear loose comfortable clothing, since you will need to lie still for a period of time. Please bring a list of all medications that you are taking; name and dosage. We want to make your waiting time as pleasant as possible. Consider bringing your favorite magazine, book or music player to help you pass the time.  You do not need to stay at the imaging facility the entire time, between the initial injection and the scan itself.    Please leave your jewelry and valuables at home.  During the Exam The DaTscan once started takes approximately 30-45 minutes. However, following injection of the DaT agent approximately 3-6 hours are required before the agent has achieved appropriate concentration in the brain.  We will inject the DaTscan through an intravenous (IV) line into your arm in the AM, usually around 8-9am, and then you will come back usually in the mid afternoon for the scan. Before the exam, you will receive a drug to allow you to protect the thyroid. For the imaging test, you will be asked to lie on a table and an imaging technologist will position your head in a headrest. A strip of tape or a flexible restraint may be placed around your head to help you to not move your head during the scan. A camera will be positioned above you and you must remain very still for about 30 minute while images are taken. The scanner will be very close to your head, but will not touch your head.

## 2022-05-29 ENCOUNTER — Telehealth: Payer: Self-pay

## 2022-05-29 NOTE — Telephone Encounter (Signed)
I had reached out to [facility] Scan scheduling about patient getting scheduled and they responded  Hello, Looking at this patients paperwork notes, looks like on 05/24/22 at 13:45pm we spoke with the wife who stated she wants to talk with the Dr. prior to scheduling. Not sure why? Shanon Brow looks like they have an appt on 2/23 with the dr. ? please advise   Now DS

## 2022-05-29 NOTE — Telephone Encounter (Signed)
He is scheduled to come in on Friday for the Skin Biopsy

## 2022-05-29 NOTE — Telephone Encounter (Signed)
Called patient only home phone available and no voicemail

## 2022-05-30 NOTE — Telephone Encounter (Signed)
Called patient wife to answer any questions and patients wife did not understand that the appointment on Friday was just for th Skin Biopsy and I was calling ot answer any questions about the Dat Scan. Patients  wife is declining Dat Scan at this time due to too many Dr. Thomasene Lot Patients wife saying they may do it in March or April

## 2022-06-01 ENCOUNTER — Encounter: Payer: Self-pay | Admitting: Neurology

## 2022-06-01 ENCOUNTER — Ambulatory Visit (INDEPENDENT_AMBULATORY_CARE_PROVIDER_SITE_OTHER): Payer: Medicare Other | Admitting: Neurology

## 2022-06-01 VITALS — BP 136/82 | HR 69 | Ht 70.0 in

## 2022-06-01 DIAGNOSIS — G20C Parkinsonism, unspecified: Secondary | ICD-10-CM | POA: Diagnosis not present

## 2022-06-01 DIAGNOSIS — R251 Tremor, unspecified: Secondary | ICD-10-CM | POA: Diagnosis not present

## 2022-06-01 NOTE — Procedures (Signed)
Punch Biopsy Procedure Note  Preprocedure Diagnosis: Bradykinesia; Tremor;   Postprocedure Diagnosis: same  Locations: Site 1: right shoulder;  Site 2: above, right knee;  Site 3: above, right foot  Indications: r/o alpha synucleinopathy  Anesthesia: 4 mL Lidocaine 1% with epinephrine without added sodium bicarbonate  Procedure Details Patient informed of the risks (including but not limited to bleeding, pain, infection, scar and infection) and benefits of the procedure.  Informed consent obtained.  The areas which were chosen for biopsy, as above, and surrounding areas were given a sterile prep using betadyne and draped in the usual sterile fashion. The skin was then stretched perpendicular to the skin tension lines and sample removed using the 3 mm punch. Pressure applied, hemostasis achieved.   Dressing applied. The specimen(s) was sent for pathologic examination. The patient tolerated the procedure well.  Estimated Blood Loss: 0 ml  Condition: Stable  Complications: none.  Plan: 1. Instructed to keep the wound dry and covered for 24-48h and clean thereafter. 2. Warning signs of infection were reviewed.

## 2022-06-06 ENCOUNTER — Other Ambulatory Visit: Payer: Self-pay | Admitting: Family Medicine

## 2022-06-21 ENCOUNTER — Telehealth: Payer: Self-pay

## 2022-06-21 DIAGNOSIS — J4530 Mild persistent asthma, uncomplicated: Secondary | ICD-10-CM

## 2022-06-21 NOTE — Telephone Encounter (Signed)
Patient wants a spacer device. Please advise if this is okay to order.  Thanks!

## 2022-06-21 NOTE — Telephone Encounter (Signed)
That's fine but be sure he brings it with him at next ov to confirm he's using it correctly

## 2022-06-25 MED ORDER — BUDESONIDE-FORMOTEROL FUMARATE 160-4.5 MCG/ACT IN AERO: 2.0000 | INHALATION_SPRAY | Freq: Two times a day (BID) | RESPIRATORY_TRACT | 11 refills | Status: DC

## 2022-06-25 MED ORDER — SPACER/AERO-HOLDING CHAMBERS DEVI: 2.0000 | Freq: Two times a day (BID) | 0 refills | Status: DC

## 2022-06-25 NOTE — Telephone Encounter (Signed)
Called and notified patients wife. She asked for spacer rx and refill of symbicort to go to [facility] [address]  Nothing further needed

## 2022-06-28 ENCOUNTER — Ambulatory Visit (HOSPITAL_COMMUNITY)
Admission: RE | Admit: 2022-06-28 | Discharge: 2022-06-28 | Disposition: A | Discharge status: Home / Self Care | Payer: Medicare Other | Source: Ambulatory Visit | Attending: Family Medicine | Admitting: Family Medicine

## 2022-06-28 DIAGNOSIS — R251 Tremor, unspecified: Secondary | ICD-10-CM | POA: Diagnosis not present

## 2022-06-28 MED ORDER — GADOBUTROL 1 MMOL/ML IV SOLN
10.0000 mL | Freq: Once | INTRAVENOUS | Status: AC | PRN
  Administered 2022-06-28: 10 mL via INTRAVENOUS

## 2022-06-29 ENCOUNTER — Telehealth: Payer: Self-pay | Admitting: Neurology

## 2022-06-29 NOTE — Telephone Encounter (Signed)
Called patients wife and let her know of skin biopsy results and told her that Dr. Carles Collet still recommended Dat scan

## 2022-06-29 NOTE — Telephone Encounter (Signed)
Pts skin biopsy came back.  There was:   No evidence of alpha synuclein in the cutaneous nerves 2.  Was evidence of small fiber neuropathy 3.  No evidence of amyloid deposition within the cutaneous nerves  Please call pt/family and let them know that skin biopsy looked good.  I would still recommend DaT scan but skin biopsy markers looked normal

## 2022-07-03 ENCOUNTER — Encounter: Payer: Self-pay | Admitting: Cardiology

## 2022-07-03 ENCOUNTER — Ambulatory Visit: Payer: Medicare Other | Attending: Cardiology | Admitting: Cardiology

## 2022-07-03 VITALS — BP 100/62 | HR 66 | Ht 70.0 in | Wt 215.0 lb

## 2022-07-03 DIAGNOSIS — I495 Sick sinus syndrome: Secondary | ICD-10-CM | POA: Insufficient documentation

## 2022-07-03 DIAGNOSIS — E782 Mixed hyperlipidemia: Secondary | ICD-10-CM | POA: Insufficient documentation

## 2022-07-03 DIAGNOSIS — Z950 Presence of cardiac pacemaker: Secondary | ICD-10-CM

## 2022-07-03 DIAGNOSIS — R0609 Other forms of dyspnea: Secondary | ICD-10-CM | POA: Diagnosis not present

## 2022-07-03 NOTE — Progress Notes (Signed)
Clinical Summary Terry Love is a 87 y.o.male seen today for follow up of the following medical problems.      1. Symptomatic bradycardia - pacemaker followed by Dr Lovena Le 03/2022 normal device function - no recent symptoms      2. Hyperlipidemia - 10/2020 TC145 TG 55 HDL 53 LDL 80 - 12/2021 TC 151 TG 57 HDL 50 LDL 89 - Jan 2024 TC 146 TG 60 HDL 54 LDL 80   3. SOB/DOE - chronic DOE, overall unchanged - extensive cardiac and pulmonary workup over the years as reported below overall benign   - chronic SOB mildly worst from baseline.  - reports breathing SOB  - chronic SOB that is stable - some swelling in legs at times.      SH: married 15 years, his wife Terry Love is also a patient.    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     atorvastatin (LIPITOR) 40 MG tablet Take 1 tablet (40 mg total) by mouth at bedtime. 90 tablet 3   budesonide-formoterol (SYMBICORT) 160-4.5 MCG/ACT inhaler Take 2 puffs first thing in am and then another 2 puffs about 12 hours later. 1 each 12   budesonide-formoterol (SYMBICORT) 160-4.5 MCG/ACT inhaler Inhale 2 puffs into the lungs 2 (two) times daily. in the morning and at bedtime. 10.2 each 11   calcium-vitamin D (OSCAL WITH D) 500-200 MG-UNIT tablet Take 1 tablet by mouth 2 (two) times daily. 150 tablet 1    carboxymethylcellulose (REFRESH PLUS) 0.5 % SOLN INSTILL 1 DROP IN EACH EYE FOUR TIMES A DAY     dorzolamide-timolol (COSOPT) 22.3-6.8 MG/ML ophthalmic solution INSTILL 1 DROP IN [facility] EYE TWO TIMES A DAY     famotidine (PEPCID) 20 MG tablet TAKE 1 TABLET BY MOUTH EVERY DAY AFTER DINNER 90 tablet 3   furosemide (LASIX) 20 MG tablet Take 1 tablet by mouth daily. May take an additional tablet for fluid/swelling. 100 tablet 3   latanoprost (XALATAN) 0.005 % ophthalmic solution Place 1 drop into both eyes at bedtime.     montelukast (SINGULAIR) 10 MG tablet TAKE 1 TABLET BY MOUTH EVERYDAY AT BEDTIME 90 tablet 1   Multiple Vitamin (MULTIVITAMIN WITH MINERALS) TABS tablet Take 1 tablet by mouth at bedtime.     omeprazole (PRILOSEC) 40 MG capsule TAKE 1 CAPSULE (40 MG TOTAL) BY MOUTH DAILY. 90 capsule 1   risperiDONE (RISPERDAL M-TABS) 0.5 MG disintegrating tablet Take 0.25 mg by mouth at bedtime.     Spacer/Aero-Holding Chambers DEVI 2 puffs by Does not apply route in the morning and at bedtime. 1 each 0   tamsulosin (FLOMAX) 0.4 MG CAPS capsule Take 1 capsule (0.4  mg total) by mouth daily. 90 capsule 3   venlafaxine XR (EFFEXOR-XR) 150 MG 24 hr capsule Take 1 capsule by mouth every morning.     vitamin C (ASCORBIC ACID) 500 MG tablet Take 500 mg by mouth at bedtime.      Vitamin D, Ergocalciferol, (DRISDOL) 1.25 MG (50000 UNIT) CAPS capsule TAKE 1 CAPSULE (50,000 UNITS TOTAL) BY MOUTH EVERY MONDAY IN THE MORNING 12 capsule 1   No current facility-administered medications for this visit.     Past Surgical History:  Procedure Laterality Date   carpal tunnel  release right  2006   CATARACT EXTRACTION, BILATERAL     COLONOSCOPY     COLONOSCOPY  01/25/2011   Dr. Rehman:Examination performed to cecum Pan colonic diverticulosis/2 small polyps ablated via cold biopsy from transverse colon/External hemorrhoids, benign polyps   COLONOSCOPY N/A 06/29/2013   Procedure: COLONOSCOPY;  Surgeon: Daneil Dolin,  MD;  Location: [facility];  Service: Endoscopy;  Laterality: N/A;  10:45   EYE SURGERY     left eye cataracts    PACEMAKER IMPLANT N/A 03/24/2018   Procedure: PACEMAKER IMPLANT;  Surgeon: Evans Lance, MD;  Location: [place] [facility];  Service: Cardiovascular;  Laterality: N/A;     No Known Allergies    Family History  Problem Relation Age of Onset   Colon cancer Mother        diagnosed at age 78   Aneurysm Father    Aneurysm Brother    Stroke Brother      Social History Terry Love reports that he has never smoked. He quit smokeless tobacco use about 6 years ago.  His smokeless tobacco use included chew. Terry Love reports no history of alcohol use.   Review of Systems CONSTITUTIONAL: No weight loss, fever, chills, weakness or fatigue.  HEENT: Eyes: No visual loss, blurred vision, double vision or yellow sclerae.No hearing loss, sneezing, congestion, runny nose or sore throat.  SKIN: No rash or itching.  CARDIOVASCULAR: per hpi RESPIRATORY: No shortness of breath, cough or sputum.  GASTROINTESTINAL: No anorexia, nausea, vomiting or diarrhea. No abdominal pain or blood.  GENITOURINARY: No burning on urination, no polyuria NEUROLOGICAL: No headache, dizziness, syncope, paralysis, ataxia, numbness or tingling in the extremities. No change in bowel or bladder control.  MUSCULOSKELETAL: No muscle, back pain, joint pain or stiffness.  LYMPHATICS: No enlarged nodes. No history of splenectomy.  PSYCHIATRIC: No history of depression or anxiety.  ENDOCRINOLOGIC: No reports of sweating, cold or heat intolerance. No polyuria or polydipsia.  Marland Kitchen   Physical Examination Today's Vitals   07/03/22 1026  BP: 100/62  Pulse: 66  SpO2: 97%  Weight: 215 lb (97.5 kg)  Height: 5\' 10"  (1.778 m)   Body mass index is 30.85 kg/m.  Gen: resting comfortably, no acute distress HEENT: no scleral icterus, pupils equal round and reactive, no palptable cervical adenopathy,  CV: RRR, no  m/rg, no jvd Resp: Clear to auscultation bilaterally GI: abdomen is soft, non-tender, non-distended, normal bowel sounds, no hepatosplenomegaly MSK: extremities are warm, trace bilateral edema  Skin: warm, no rash Neuro:  no focal deficits Psych: appropriate affect   Diagnostic Studies 02/2018 echo Study Conclusions   - Left ventricle: The cavity size was normal. Wall thickness was    increased increased in a pattern of mild to moderate LVH.    Systolic function was normal. The estimated ejection fraction was    in the range of 60% to 65%. Wall motion was normal;  there were no    regional wall motion abnormalities. Doppler parameters are    consistent with abnormal left ventricular relaxation (grade 1    diastolic dysfunction).  - Aortic valve: Moderately calcified annulus. Trileaflet; mildly    calcified leaflets. There was mild regurgitation.  - Mitral valve: Mildly calcified annulus. There was trivial    regurgitation.  - Right atrium: Central venous pressure (est): 3 mm Hg.  - Atrial septum: No defect or patent foramen ovale was identified.  - Tricuspid valve: There was trivial regurgitation.  - Pulmonary arteries: Systolic pressure could not be accurately    estimated.  - Pericardium, extracardiac: There was no pericardial effusion.      02/2018 GXT Nondiagnostic exercise treadmill test for ischemia due to blunted heart rate response, however consistent with chronotropic incompetence with patient achieving only 57% of MPHR. Test discontinued due to patient fatigue and shortness of breath. Blood pressure demonstrated a normal response to exercise.       03/2016 nuclear stress There was no ST segment deviation noted during stress. The study is normal. No ischemia or scar. This is a low risk study. Nuclear stress EF: 70%.     08/2016 PFTs Minimal diffusion defect, no obstruction         Assessment and Plan   1. Pacemaker/symptomatic bradycardia/sinus  bradycardia - no recent symptoms, has remote device check scheduled for tomorrow - continue to monitor      2. DOE - extensive workup over the last few years fairly benign - likely significant component of deconditioning, - symptoms are chronic and stable, no additoinal testing at this time  3. LE edema - continue lasix, careful dosign as he has had issues with low bp's in the past  4. Hyperlipidemia - at goal, continue current meds  F/u 6 months  Arnoldo Lenis, M.D.

## 2022-07-03 NOTE — Patient Instructions (Signed)
Medication Instructions:  Your physician recommends that you continue on your current medications as directed. Please refer to the Current Medication list given to you today.  *If you need a refill on your cardiac medications before your next appointment, please call your pharmacy*   Lab Work: None If you have labs (blood work) drawn today and your tests are completely normal, you will receive your results only by: MyChart Message (if you have MyChart) OR A paper copy in the mail If you have any lab test that is abnormal or we need to change your treatment, we will call you to review the results.   Testing/Procedures: None   Follow-Up: At [facility], you and your health needs are our priority.  As part of our continuing mission to provide you with exceptional heart care, we have created designated Provider Care Teams.  These Care Teams include your primary Cardiologist (physician) and Advanced Practice Providers (APPs -  Physician Assistants and Nurse Practitioners) who all work together to provide you with the care you need, when you need it.  We recommend signing up for the patient portal called "MyChart".  Sign up information is provided on this After Visit Summary.  MyChart is used to connect with patients for Virtual Visits (Telemedicine).  Patients are able to view lab/test results, encounter notes, upcoming appointments, etc.  Non-urgent messages can be sent to your provider as well.   To learn more about what you can do with MyChart, go to [email or web address].    Your next appointment:   6 month(s)  Provider:   Jonathan Branch, MD    Other Instructions    

## 2022-07-04 ENCOUNTER — Ambulatory Visit (INDEPENDENT_AMBULATORY_CARE_PROVIDER_SITE_OTHER): Payer: Medicare Other

## 2022-07-04 DIAGNOSIS — I495 Sick sinus syndrome: Secondary | ICD-10-CM

## 2022-07-04 LAB — CUP PACEART REMOTE DEVICE CHECK
Battery Voltage: 65
Date Time Interrogation Session: 20240327074757
Implantable Lead Connection Status: 753985
Implantable Lead Connection Status: 753985
Implantable Lead Implant Date: 20191216
Implantable Lead Implant Date: 20191216
Implantable Lead Location: 753859
Implantable Lead Location: 753860
Implantable Lead Model: 377
Implantable Lead Model: 377
Implantable Lead Serial Number: 80840322
Implantable Lead Serial Number: 80924555
Implantable Pulse Generator Implant Date: 20191216
Pulse Gen Model: 407145
Pulse Gen Serial Number: 69486427

## 2022-07-06 ENCOUNTER — Other Ambulatory Visit: Payer: Self-pay | Admitting: Family Medicine

## 2022-07-11 DIAGNOSIS — E041 Nontoxic single thyroid nodule: Secondary | ICD-10-CM | POA: Diagnosis not present

## 2022-07-11 DIAGNOSIS — R7302 Impaired glucose tolerance (oral): Secondary | ICD-10-CM | POA: Diagnosis not present

## 2022-07-11 DIAGNOSIS — E669 Obesity, unspecified: Secondary | ICD-10-CM | POA: Diagnosis not present

## 2022-07-12 LAB — CBC
Hematocrit: 39.1 % (ref 37.5–51.0)
Hemoglobin: 12.9 g/dL — ABNORMAL LOW (ref 13.0–17.7)
MCH: 30.6 pg (ref 26.6–33.0)
MCHC: 33 g/dL (ref 31.5–35.7)
MCV: 93 fL (ref 79–97)
Platelets: 307 10*3/uL (ref 150–450)
RBC: 4.22 x10E6/uL (ref 4.14–5.80)
RDW: 11.3 % — ABNORMAL LOW (ref 11.6–15.4)
WBC: 6.6 10*3/uL (ref 3.4–10.8)

## 2022-07-12 LAB — HEMOGLOBIN A1C
Est. average glucose Bld gHb Est-mCnc: 117 mg/dL
Hgb A1c MFr Bld: 5.7 % — ABNORMAL HIGH (ref 4.8–5.6)

## 2022-07-12 LAB — TSH: TSH: 1.99 u[IU]/mL (ref 0.450–4.500)

## 2022-08-13 NOTE — Progress Notes (Signed)
Remote pacemaker transmission.   

## 2022-08-30 ENCOUNTER — Other Ambulatory Visit: Payer: Self-pay | Admitting: Family Medicine

## 2022-09-12 NOTE — Progress Notes (Unsigned)
Terry Love, male    DOB: [birth year]  MRN: [id number]   Brief patient profile:  62 yobm  never smoker/retired in cig factory referred to pulmonary clinic in [place]  05/09/2021 by Dr Isaiah Serge for asthma f/u - onset was  2012   W/u as of 05/09/2021 1st [place]   PFTs 09/05/16 FVC 2.90 (84%), FEV1 2.32 (92%), F/F 80, TLC 82%, DLCO 79% Minimal reduction in diffusion capacity.  FENO 09/05/16- 54  Labs: CBC with diff 09/05/16- WBC 8.2, 5.4% eos, absolute eso count 460 Blood allergy screen  09/05/16- negative RAST, IgE 150  Cardiac: Echo 03/05/2018 LVH, EF 60 to 65%, grade 1 diastolic dysfunction      History of Present Illness  05/09/2021  Pulmonary/ 1st office eval/ [place] Nylund / [place] [facility] re asthma maint on symbicort 160 with poor techique  Chief Complaint  Patient presents with   New Patient (Initial Visit)    Has seen Dr. Isaiah Serge in the past is switching to [place] office for location.   Concerns about breathing   Using symbicort 2 puffs in am and 2 puffs in pm. Wants to discuss different inhaler costs.   Dyspnea:  walking about 50 ft and stop x sev years  Cough: not a problem  Sleep: bed is flat/ on side s am flare but sometimes wheeze per wife  SABA use:  several times a week but never prechallenges  Rec Continue omeprazole 40 mg Take 30-60 min before first meal of the day  Add pepcid 20 mg after supper  Work on inhaler technique:   Ok to try albuterol 15 min before an activity (on alternating days)  that you know would usually make you short of breath GERD diet reviewed, bed blocks rec  Please schedule a follow up office visit in 6 weeks, call sooner if needed with all medications /inhalers/ solutions in hand     12/18/2021  f/u ov/[place] office/Quade Ramirez re: asthma maint on symbicort 160  2bid with suboptimal hfa Chief Complaint  Patient presents with   Follow-up    Feels breathing is getting better    Dyspnea:  easier than it was to do adls but not very  active Cough: none  Sleeping: on back/ props up on  pillows and sleepis fine SABA use: rarely now  02: none  Rec Plan A = Automatic = Always=    Symbicort  160 Take 2 puffs first thing in am and then another 2 puffs about 12 hours later.   Work on inhaler technique:     - remember how golfers take practice swings - use your empty symbicort for training before each use  Plan B = Backup (to supplement plan A, not to replace it) Only use your albuterol inhaler as a rescue medication Please schedule a follow up visit in 3 months but call sooner if needed- bring your inhalers     03/16/2022  f/u ov/[place] office/Sary Bogie re: asthma maint on symbicort 160  with moderate hfa effectiveness  Chief Complaint  Patient presents with   Follow-up    Better since last ov  Needs to discuss inhalers. Letter from insurance stating inhaler wont be covered in 2024 and need to change to generic breo or advair   Dyspnea:  across the parking lot slowly  - does better pushing grocery cart  Cough: none  Sleeping: props up with bed blocks  SABA use:  one -two times  02: none  Rec Generic  Symbicort 160 Take 2 puffs first  thing in am and then another 2 puffs about 12 hours later.  Only use your albuterol as a rescue medication  Also  Ok to try albuterol 15 min before an activity (on alternating days)  that you know would usually make you short of breath and see if it makes any difference and if makes none then don't take albuterol after activity unless you can't catch your breath as this means it's the resting that helps, not the albuterol.       09/13/2022 6 m   f/u ov/[place] office/Rondy Krupinski re: asthma maint on symbicort 160 2 puffs   Chief Complaint  Patient presents with   Follow-up    He has noticed increased DOE since the last visit. He was winded walking from lobby to exam room today.   Dyspnea:  walk car to house in heat or shower  Cough: none  Sleeping: bed blocks  SABA use: twice a day never at  night  02: none    No obvious day to day or daytime variability or assoc excess/ purulent sputum or mucus plugs or hemoptysis or cp or chest tightness, subjective wheeze or overt sinus or hb symptoms.   Sleeping  without nocturnal  or early am exacerbation  of respiratory  c/o's or need for noct saba. Also denies any obvious fluctuation of symptoms with weather or environmental changes or other aggravating or alleviating factors except as outlined above   No unusual exposure hx or h/o childhood pna/ asthma or knowledge of premature birth.  Current Allergies, Complete Past Medical History, Past Surgical History, Family History, and Social History were reviewed in [employer] record.  ROS  The following are not active complaints unless bolded Hoarseness, sore throat, dysphagia, dental problems, itching, sneezing,  nasal congestion or discharge of excess mucus or purulent secretions, ear ache,   fever, chills, sweats, unintended wt loss or wt gain, classically pleuritic or exertional cp,  orthopnea pnd or arm/hand swelling  or leg swelling, presyncope, palpitations, abdominal pain, anorexia, nausea, vomiting, diarrhea  or change in bowel habits or change in bladder habits, change in stools or change in urine, dysuria, hematuria,  rash, arthralgias, visual complaints, headache, numbness, weakness or ataxia or problems with walking or coordination,  change in mood or  memory.muscle aches        Current Meds  Medication Sig   albuterol (VENTOLIN HFA) 108 (90 Base) MCG/ACT inhaler INHALE 2 PUFFS INTO THE LUNGS EVERY 4 (FOUR) HOURS AS NEEDED FOR WHEEZING OR SHORTNESS OF BREATH   aspirin 81 MG EC tablet Take 81 mg by mouth at bedtime.   atorvastatin (LIPITOR) 40 MG tablet Take 1 tablet (40 mg total) by mouth at bedtime.   budesonide-formoterol (SYMBICORT) 160-4.5 MCG/ACT inhaler Take 2 puffs first thing in am and then another 2 puffs about 12 hours later.   calcium-vitamin D (OSCAL  WITH D) 500-200 MG-UNIT tablet Take 1 tablet by mouth 2 (two) times daily.   carboxymethylcellulose (REFRESH PLUS) 0.5 % SOLN INSTILL 1 DROP IN EACH EYE FOUR TIMES A DAY   dorzolamide-timolol (COSOPT) 22.3-6.8 MG/ML ophthalmic solution INSTILL 1 DROP IN [facility] EYE TWO TIMES A DAY   famotidine (PEPCID) 20 MG tablet TAKE 1 TABLET BY MOUTH EVERY DAY AFTER DINNER   furosemide (LASIX) 20 MG tablet Take 1 tablet by mouth daily. May take an additional tablet for fluid/swelling.   latanoprost (XALATAN) 0.005 % ophthalmic solution Place 1 drop into both eyes at bedtime.   montelukast (SINGULAIR)  10 MG tablet TAKE 1 TABLET BY MOUTH EVERYDAY AT BEDTIME   Multiple Vitamin (MULTIVITAMIN WITH MINERALS) TABS tablet Take 1 tablet by mouth at bedtime.   omeprazole (PRILOSEC) 40 MG capsule TAKE 1 CAPSULE (40 MG TOTAL) BY MOUTH DAILY.   risperiDONE (RISPERDAL M-TABS) 0.5 MG disintegrating tablet Take 0.25 mg by mouth at bedtime.   Spacer/Aero-Holding Chambers DEVI 2 puffs by Does not apply route in the morning and at bedtime.   tamsulosin (FLOMAX) 0.4 MG CAPS capsule Take 1 capsule (0.4 mg total) by mouth daily.   venlafaxine XR (EFFEXOR-XR) 150 MG 24 hr capsule Take 1 capsule by mouth every morning.   vitamin C (ASCORBIC ACID) 500 MG tablet Take 500 mg by mouth at bedtime.    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   Obesity    Seasonal allergies         Objective:    Wts  09/13/2022         212  03/16/2022       215  12/18/2021       215   06/21/21 217 lb 6.4 oz (98.6 kg)  06/16/21 218 lb (98.9 kg)  06/14/21 219  lb (99.3 kg)     Vital signs reviewed  09/13/2022  - Note at rest 02 sats  96% on RA   General appearance:    amb elderly bm nad          HEENT : Oropharynx clear/ edentulous          NECK :  without  apparent JVD/ palpable Nodes/TM    LUNGS: no acc muscle use,  Nl contour chest which is clear to A and P bilaterally without cough on insp or exp maneuvers   CV:  RRR  no s3 or murmur or increase in P2, and no edema   ABD:  soft and nontender   MS:  Nl gait/ ext warm without deformities Or obvious joint restrictions  calf tenderness, cyanosis or clubbing    SKIN: warm and dry without lesions    NEURO:  alert, approp, nl sensorium with  no motor or cerebellar deficits apparent. Slt tremor     CXR PA and Lateral:   09/13/2022 :    I personally reviewed images and impression is as follows:     Mild T kyphosis/ reduced lung vol/ no acute findings     Assessment

## 2022-09-13 ENCOUNTER — Ambulatory Visit (HOSPITAL_COMMUNITY)
Admission: RE | Admit: 2022-09-13 | Discharge: 2022-09-13 | Disposition: A | Discharge status: Home / Self Care | Payer: Medicare Other | Source: Ambulatory Visit | Attending: Internal Medicine | Admitting: Internal Medicine

## 2022-09-13 ENCOUNTER — Ambulatory Visit (INDEPENDENT_AMBULATORY_CARE_PROVIDER_SITE_OTHER): Payer: Medicare Other | Admitting: Internal Medicine

## 2022-09-13 ENCOUNTER — Encounter: Payer: Self-pay | Admitting: Internal Medicine

## 2022-09-13 VITALS — BP 106/72 | HR 71 | Ht 70.0 in | Wt 212.4 lb

## 2022-09-13 DIAGNOSIS — R0600 Dyspnea, unspecified: Secondary | ICD-10-CM | POA: Diagnosis not present

## 2022-09-13 DIAGNOSIS — J4530 Mild persistent asthma, uncomplicated: Secondary | ICD-10-CM | POA: Diagnosis not present

## 2022-09-13 DIAGNOSIS — R0609 Other forms of dyspnea: Secondary | ICD-10-CM

## 2022-09-13 DIAGNOSIS — R059 Cough, unspecified: Secondary | ICD-10-CM | POA: Diagnosis not present

## 2022-09-13 LAB — NITRIC OXIDE: Nitric Oxide: 18

## 2022-09-13 NOTE — Patient Instructions (Signed)
Ok to try albuterol 15 min before an activity (on alternating days)  that you know would usually make you short of breath and see if it makes any difference and if makes none then don't take albuterol after activity unless you can't catch your breath as this means it's the resting that helps, not the albuterol.   Work on inhaler technique:  relax and gently blow all the way out then take a nice smooth full deep breath back in, triggering the inhaler at same time you start breathing in.  Hold breath in for at least  5 seconds if you can. Blow out symbicort  thru nose. Rinse and gargle with water when done.  If mouth or throat bother you at all,  try brushing teeth/gums/tongue with arm and hammer toothpaste/ make a slurry and gargle and spit out.   Please remember to go to the lab department when it is open for your tests - we will call you with the results when they are available.     Please remember to go to the  x-ray department  @  [facility] for your tests - we will call you with the results when they are available     Please schedule a follow up office visit in 6 weeks, call sooner if needed with all medications /inhalers/ solutions in hand so we can verify exactly what you are taking. This includes all medications from all doctors and over the counters

## 2022-09-13 NOTE — Assessment & Plan Note (Signed)
Onset ? Around 2012 at wt = 246  -  PFTs 09/05/16 FVC 2.90 (84%), FEV1 2.32 (92%), F/F 80, TLC 82%, DLCO 79% Minimal reduction in diffusion capacity.  - 12/18/2021    continue symbicort 160 - 03/16/2022   changed to  generic symb 160  - 06/21/2022 spacer ordered > not being used as of 09/13/2022 - 09/13/2022  After extensive coaching inhaler device,  effectiveness =   75% from a baseline of near 0 so rec continue symb 160 but work on technique "like a golfer warms up" - FENO 09/13/2022  = 18 despite very poor hfa technique   Doubt asthma has anything to do with his symptoms and may consider trial off symbicort on return.   Each maintenance medication was reviewed in detail including emphasizing most importantly the difference between maintenance and prns and under what circumstances the prns are to be triggered using an action plan format where appropriate.  Total time for H and P, chart review, counseling,  directly observing portions of ambulatory 02 saturation study/ and generating customized AVS unique to this office visit / same day charting > 40 min

## 2022-09-13 NOTE — Assessment & Plan Note (Signed)
Onset ? Around 2012 at wt = 246  -  PFTs 09/05/16 FVC 2.90 (84%), FEV1 2.32 (92%), F/F 80, TLC 82%, DLCO 79% Minimal reduction in diffusion capacity. - 05/09/2021   Walked on Ra  x  one  lap(s) =  approx 150  ft  @ slow pace, stopped due to fatigue and sob with lowest 02 sats 97%  - 06/21/2021   Walked on RA  x  3  lap(s) =  approx 450  ft  @ fast pace, stopped due to end of study min sob with lowest 02 sats 96%  - 12/18/2021  After extensive coaching inhaler device,  effectiveness =    50%  > continue symbicort 160  - 09/13/2022   Walked on RA  x  2  lap(s) =  approx 300  ft  @ slow to mod pace but a bit unsteady/ bent over at waist , stopped with  very mild sob with lowest 02 sats 95%   Symptoms are   disproportionate to objective findings and not clear to what extent this is actually a pulmonary  problem but pt does appear to have difficult to sort out respiratory symptoms of unknown origin for which  DDX  = almost all start with A and  include Adherence, Ace Inhibitors, Acid Reflux, Active Sinus Disease, Alpha 1 Antitripsin deficiency, Anxiety masquerading as Airways dz,  ABPA,  Allergy(esp in young), Aspiration (esp in elderly), Adverse effects of meds,  Active smoking or Vaping, A bunch of PE's/clot burden (a few small clots can't cause this syndrome unless there is already severe underlying pulm or vascular dz with poor reserve),  Anemia or thyroid disorder, plus two Bs  = Bronchiectasis and Beta blocker use..and one C= CHF    Main concerns highlighted   Adherence is always the initial "prime suspect" and is a multilayered concern that requires a "trust but verify" approach in every patient - starting with knowing how to use medications, especially inhalers, correctly, keeping up with refills and understanding the fundamental difference between maintenance and prns vs those medications only taken for a very short course and then stopped and not refilled.  - see hfa teaching - return with all meds in  hand using a trust but verify approach to confirm accurate Medication  Reconciliation The principal here is that until we are certain that the  patients are doing what we've asked, it makes no sense to ask them to do more.   ? Acid (or non-acid) GERD > always difficult to exclude as up to 75% of pts in some series report no assoc GI/ Heartburn symptoms> rec max (24h)  acid suppression and diet restrictions/ reviewed and instructions given in writing.   ? Adverse drug effects > check CPK due to high dose lipitor/muscle aches  ? Anxiety/depression deconditioning > usually at the bottom of this list of usual suspects but should be much higher on this pt's based on H and P and note already on psychotropics and may interfere with adherence and also interpretation of response or lack thereof to symptom management which can be quite subjective.

## 2022-09-14 DIAGNOSIS — R0609 Other forms of dyspnea: Secondary | ICD-10-CM | POA: Diagnosis not present

## 2022-09-18 LAB — CBC WITH DIFFERENTIAL/PLATELET
Basophils Absolute: 0.1 10*3/uL (ref 0.0–0.2)
Basos: 1 %
EOS (ABSOLUTE): 0.2 10*3/uL (ref 0.0–0.4)
Eos: 4 %
Hematocrit: 38.3 % (ref 37.5–51.0)
Hemoglobin: 12.5 g/dL — ABNORMAL LOW (ref 13.0–17.7)
Immature Grans (Abs): 0 10*3/uL (ref 0.0–0.1)
Immature Granulocytes: 0 %
Lymphocytes Absolute: 1.3 10*3/uL (ref 0.7–3.1)
Lymphs: 26 %
MCH: 29.8 pg (ref 26.6–33.0)
MCHC: 32.6 g/dL (ref 31.5–35.7)
MCV: 91 fL (ref 79–97)
Monocytes Absolute: 0.5 10*3/uL (ref 0.1–0.9)
Monocytes: 11 %
Neutrophils Absolute: 2.9 10*3/uL (ref 1.4–7.0)
Neutrophils: 58 %
Platelets: 284 10*3/uL (ref 150–450)
RBC: 4.2 x10E6/uL (ref 4.14–5.80)
RDW: 11.4 % — ABNORMAL LOW (ref 11.6–15.4)
WBC: 5.1 10*3/uL (ref 3.4–10.8)

## 2022-09-18 LAB — D-DIMER, QUANTITATIVE: D-DIMER: 0.78 mg/L FEU — ABNORMAL HIGH (ref 0.00–0.49)

## 2022-09-18 LAB — BRAIN NATRIURETIC PEPTIDE: BNP: 26.9 pg/mL (ref 0.0–100.0)

## 2022-09-18 LAB — CK: Total CK: 219 U/L — ABNORMAL HIGH (ref 30–208)

## 2022-09-18 LAB — IGE: IgE (Immunoglobulin E), Serum: 122 IU/mL (ref 6–495)

## 2022-09-20 DIAGNOSIS — Z23 Encounter for immunization: Secondary | ICD-10-CM | POA: Diagnosis not present

## 2022-10-02 ENCOUNTER — Ambulatory Visit: Payer: Medicare Other | Attending: Internal Medicine | Admitting: Internal Medicine

## 2022-10-02 ENCOUNTER — Encounter: Payer: Self-pay | Admitting: Internal Medicine

## 2022-10-02 VITALS — BP 102/70 | HR 60 | Ht 70.0 in | Wt 205.0 lb

## 2022-10-02 DIAGNOSIS — I495 Sick sinus syndrome: Secondary | ICD-10-CM | POA: Diagnosis not present

## 2022-10-02 LAB — CUP PACEART INCLINIC DEVICE CHECK
Battery Remaining Longevity: 63 mo
Brady Statistic RA Percent Paced: 95 %
Brady Statistic RV Percent Paced: 12 %
Date Time Interrogation Session: 20240625171352
Implantable Lead Connection Status: 753985
Implantable Lead Connection Status: 753985
Implantable Lead Implant Date: 20191216
Implantable Lead Implant Date: 20191216
Implantable Lead Location: 753859
Implantable Lead Location: 753860
Implantable Lead Model: 377
Implantable Lead Model: 377
Implantable Lead Serial Number: 80840322
Implantable Lead Serial Number: 80924555
Implantable Pulse Generator Implant Date: 20191216
Lead Channel Impedance Value: 468 Ohm
Lead Channel Impedance Value: 565 Ohm
Lead Channel Pacing Threshold Amplitude: 1 V
Lead Channel Pacing Threshold Amplitude: 1 V
Lead Channel Pacing Threshold Pulse Width: 0.4 ms
Lead Channel Pacing Threshold Pulse Width: 0.4 ms
Lead Channel Sensing Intrinsic Amplitude: 1 mV
Lead Channel Sensing Intrinsic Amplitude: 7.4 mV
Pulse Gen Model: 407145
Pulse Gen Serial Number: 69486427

## 2022-10-02 NOTE — Progress Notes (Signed)
HPI [name] returns today for followup. He is a pleasant 87 yo man with a h/o HTN and COPD who was found to have chronotropic incompetence and underwent PPM insertion. In the interim, he has still had some dyspnea/fatigue with exertion but still remains active. No PND or orthopnea. No edema. He admits to being sedentary. His sob is improved. He has been on only low dose lasix.   No Known Allergies   Current Outpatient Medications  Medication Sig Dispense Refill   albuterol (VENTOLIN HFA) 108 (90 Base) MCG/ACT inhaler INHALE 2 PUFFS INTO THE LUNGS EVERY 4 (FOUR) HOURS AS NEEDED FOR WHEEZING OR SHORTNESS OF BREATH 18 g 11   aspirin 81 MG EC tablet Take 81 mg by mouth at bedtime.     atorvastatin (LIPITOR) 40 MG tablet Take 1 tablet (40 mg total) by mouth at bedtime. 90 tablet 3   budesonide-formoterol (SYMBICORT) 160-4.5 MCG/ACT inhaler Take 2 puffs first thing in am and then another 2 puffs about 12 hours later. 1 each 12   calcium-vitamin D (OSCAL WITH D) 500-200 MG-UNIT tablet Take 1 tablet by mouth 2 (two) times daily. 150 tablet 1   carboxymethylcellulose (REFRESH PLUS) 0.5 % SOLN INSTILL 1 DROP IN EACH EYE FOUR TIMES A DAY     dorzolamide-timolol (COSOPT) 22.3-6.8 MG/ML ophthalmic solution INSTILL 1 DROP IN [facility] EYE TWO TIMES A DAY     famotidine (PEPCID) 20 MG tablet TAKE 1 TABLET BY MOUTH EVERY DAY AFTER DINNER 90 tablet 3   furosemide (LASIX) 20 MG tablet Take 1 tablet by mouth daily. May take an additional tablet for fluid/swelling. 100 tablet 3   latanoprost (XALATAN) 0.005 % ophthalmic solution Place 1 drop into both eyes at bedtime.     montelukast (SINGULAIR) 10 MG tablet TAKE 1 TABLET BY MOUTH EVERYDAY AT BEDTIME 90 tablet 1   Multiple Vitamin (MULTIVITAMIN WITH MINERALS) TABS tablet Take 1 tablet by mouth at bedtime.     omeprazole (PRILOSEC) 40 MG capsule TAKE 1 CAPSULE (40 MG TOTAL) BY MOUTH DAILY. 90 capsule 1   risperiDONE (RISPERDAL M-TABS) 0.5 MG disintegrating  tablet Take 0.25 mg by mouth at bedtime.     Spacer/Aero-Holding Chambers DEVI 2 puffs by Does not apply route in the morning and at bedtime. 1 each 0   tamsulosin (FLOMAX) 0.4 MG CAPS capsule Take 1 capsule (0.4 mg total) by mouth daily. 90 capsule 3   venlafaxine XR (EFFEXOR-XR) 150 MG 24 hr capsule Take 1 capsule by mouth every morning.     vitamin C (ASCORBIC ACID) 500 MG tablet Take 500 mg by mouth at bedtime.      Vitamin D, Ergocalciferol, (DRISDOL) 1.25 MG (50000 UNIT) CAPS capsule TAKE 1 CAPSULE (50,000 UNITS TOTAL) BY MOUTH EVERY MONDAY IN THE MORNING 12 capsule 1   No current facility-administered medications for this visit.     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  Past Surgical History:  Procedure Laterality Date   carpal tunnel  release right  2006   CATARACT EXTRACTION, BILATERAL     COLONOSCOPY     COLONOSCOPY  01/25/2011   Dr. Rehman:Examination performed to cecum Pan colonic diverticulosis/2 small polyps ablated via cold biopsy from transverse colon/External hemorrhoids, benign polyps   COLONOSCOPY N/A 06/29/2013   Procedure: COLONOSCOPY;  Surgeon: Corbin Ade, MD;  Location: [facility];  Service: Endoscopy;  Laterality: N/A;  10:45   EYE SURGERY     left eye cataracts    PACEMAKER IMPLANT N/A 03/24/2018   Procedure: PACEMAKER IMPLANT;  Surgeon: Marinus Maw, MD;  Location: [facility] INVASIVE CV LAB;  Service: Cardiovascular;  Laterality: N/A;     Family History  Problem Relation Age of Onset   Colon cancer Mother        diagnosed at age 60   Aneurysm Father     Aneurysm Brother    Stroke Brother      Social History   Socioeconomic History   Marital status: Married    Spouse name: Terry Love    Number of children: 0   Years of education: 12   Highest education level: 12th grade  Occupational History   Occupation: retired    Comment: [employer];  Tobacco Use   Smoking status: Never   Smokeless tobacco: Former    Types: Chew    Quit date: 03/13/2016  Vaping Use   Vaping Use: Never used  Substance and Sexual Activity   Alcohol use: No    Alcohol/week: 0.0 standard drinks of alcohol   Drug use: No   Sexual activity: Not Currently  Other Topics Concern   Not on file  Social History Narrative   Right handed    Social Determinants of Health   Financial Resource Strain: Low Risk  (03/19/2022)   Overall Financial Resource Strain (CARDIA)    Difficulty of Paying Living Expenses: Not hard at all  Food Insecurity: No Food Insecurity (03/19/2022)   Hunger Vital Sign    Worried About Running Out of Food in the Last Year: Never true    Ran Out of Food in the Last Year: Never true  Transportation Needs: No Transportation Needs (03/19/2022)   PRAPARE - Administrator, Civil Service (Medical): No    Lack of Transportation (Non-Medical): No  Physical Activity: Insufficiently Active (03/19/2022)   Exercise Vital Sign    Days of Exercise per Week: 3 days    Minutes of Exercise per Session: 20 min  Stress: No Stress Concern Present (03/19/2022)   [employer] of Occupational Health - Occupational Stress Questionnaire    Feeling of Stress : Not at all  Social Connections: Moderately Integrated (03/19/2022)   Social Connection and Isolation Panel [NHANES]    Frequency of Communication with Friends and Family: More than three times a week    Frequency of Social Gatherings with Friends and Family: More than three times a week    Attends Religious Services: More than 4 times per year    Active Member of [employer] or  Organizations: No    Attends Banker Meetings: Never    Marital Status: Married  Catering manager Violence: Not At Risk (03/19/2022)   Humiliation, Afraid, Rape, and Kick questionnaire    Fear of Current or Ex-Partner: No    Emotionally Abused: No    Physically Abused: No    Sexually Abused: No     BP 102/70  Pulse 60   Ht 5\' 10"  (1.778 m)   Wt 205 lb (93 kg)   SpO2 95%   BMI 29.41 kg/m   Physical Exam:  Well appearing NAD HEENT: Unremarkable Neck:  No JVD, no thyromegally Lymphatics:  No adenopathy Back:  No CVA tenderness Lungs:  Clear with no wheezes HEART:  Regular rate rhythm, no murmurs, no rubs, no clicks Abd:  soft, positive bowel sounds, no organomegally, no rebound, no guarding Ext:  2 plus pulses, no edema, no cyanosis, no clubbing Skin:  No rashes no nodules Neuro:  CN II through XII intact, motor grossly intact  EKG - nsr with atrial pacing  DEVICE  Normal device function.  See PaceArt for details.   Assess/Plan:  1. Chronic diastolic heart failure - he will continue his lasix.  2. Sinus node dysfunction - he is s/p PPM insertion. He is asymptomatic. 3. Obesity - he is encouraged to lose weight.  4. PPM - his biotronik DDD PM is working normally. He is pacing 98% of the time in the atrium and almost none in the ventricle due to sinus node dysfunction.   Terry Gowda Leslieann Whisman,MD

## 2022-10-02 NOTE — Patient Instructions (Signed)
Medication Instructions:  Your physician recommends that you continue on your current medications as directed. Please refer to the Current Medication list given to you today.   Labwork: None today  Testing/Procedures: None today  Follow-Up: 1 year  Any Other Special Instructions Will Be Listed Below (If Applicable).  If you need a refill on your cardiac medications before your next appointment, please call your pharmacy.  

## 2022-10-02 NOTE — Addendum Note (Signed)
Addended by: Lacy Duverney R on: 10/02/2022 01:32 PM   Modules accepted: Orders

## 2022-10-03 ENCOUNTER — Ambulatory Visit (INDEPENDENT_AMBULATORY_CARE_PROVIDER_SITE_OTHER): Payer: Medicare Other

## 2022-10-03 DIAGNOSIS — I495 Sick sinus syndrome: Secondary | ICD-10-CM | POA: Diagnosis not present

## 2022-10-08 LAB — CUP PACEART REMOTE DEVICE CHECK
Date Time Interrogation Session: 20240628130449
Implantable Lead Connection Status: 753985
Implantable Lead Connection Status: 753985
Implantable Lead Implant Date: 20191216
Implantable Lead Implant Date: 20191216
Implantable Lead Location: 753859
Implantable Lead Location: 753860
Implantable Lead Model: 377
Implantable Lead Model: 377
Implantable Lead Serial Number: 80840322
Implantable Lead Serial Number: 80924555
Implantable Pulse Generator Implant Date: 20191216
Pulse Gen Model: 407145
Pulse Gen Serial Number: 69486427

## 2022-10-17 ENCOUNTER — Ambulatory Visit (INDEPENDENT_AMBULATORY_CARE_PROVIDER_SITE_OTHER): Payer: Medicare Other | Admitting: Family Medicine

## 2022-10-17 ENCOUNTER — Encounter: Payer: Self-pay | Admitting: Family Medicine

## 2022-10-17 VITALS — BP 110/74 | HR 73 | Ht 70.0 in | Wt 208.0 lb

## 2022-10-17 DIAGNOSIS — E559 Vitamin D deficiency, unspecified: Secondary | ICD-10-CM | POA: Diagnosis not present

## 2022-10-17 DIAGNOSIS — F324 Major depressive disorder, single episode, in partial remission: Secondary | ICD-10-CM

## 2022-10-17 DIAGNOSIS — E669 Obesity, unspecified: Secondary | ICD-10-CM

## 2022-10-17 DIAGNOSIS — E66811 Obesity, class 1: Secondary | ICD-10-CM

## 2022-10-17 DIAGNOSIS — R7302 Impaired glucose tolerance (oral): Secondary | ICD-10-CM

## 2022-10-17 DIAGNOSIS — J4530 Mild persistent asthma, uncomplicated: Secondary | ICD-10-CM

## 2022-10-17 DIAGNOSIS — E785 Hyperlipidemia, unspecified: Secondary | ICD-10-CM

## 2022-10-17 DIAGNOSIS — I495 Sick sinus syndrome: Secondary | ICD-10-CM | POA: Diagnosis not present

## 2022-10-17 DIAGNOSIS — R251 Tremor, unspecified: Secondary | ICD-10-CM

## 2022-10-17 NOTE — Patient Instructions (Addendum)
F/U in October, call if you need me sooner  Keep up the  great work  Fasting lipid , cmp and EGFr, hBA1C  and vit D  3 to 5 days before next visit  Keep up the good work , no med changes  Thanks for choosing [facility], we consider it a privelige to serve you.   Please try to ensure you get very close f/u re depression and anxiety, medication dOES treat both  . Continue to be active as you can  be and continue healthy diet  Keep f/u Neurology  Thanks for choosing [facility], we consider it a privelige to serve you.

## 2022-10-21 NOTE — Assessment & Plan Note (Signed)
Recent cardiology follow up reviewed and he has an excellent report

## 2022-10-21 NOTE — Assessment & Plan Note (Signed)
Ynontrolled , has appt with new Psych at the [place], not suicidal or homicidal

## 2022-10-21 NOTE — Assessment & Plan Note (Signed)
Improved  has f/u with pulmonary, management per pulmonary

## 2022-10-21 NOTE — Assessment & Plan Note (Signed)
Tentative  dx of parkinson's will await final from Neurology, currently on ne specific medication

## 2022-10-21 NOTE — Assessment & Plan Note (Signed)
Hyperlipidemia:Low fat diet discussed and encouraged.   Lipid Panel  Lab Results  Component Value Date   CHOL 146 05/07/2022   HDL 54 05/07/2022   LDLCALC 80 05/07/2022   TRIG 60 05/07/2022   CHOLHDL 2.7 05/07/2022     Updated lab needed at/ before next visit. Controlled hen recently checked

## 2022-10-21 NOTE — Progress Notes (Signed)
Terry Love     MRN: [id number]      DOB: 1935/05/26  Chief Complaint  Patient presents with   Follow-up    Follow up    HPI Terry Love is here for follow up and re-evaluation of chronic medical conditions, medication management and review of any available recent lab and radiology data.  Preventive health is updated, specifically  Cancer screening and Immunization.   Questions or concerns regarding consultations or procedures which the PT has had in the interim are  addressed.Has ben evaluated by cardiology and neurology since last appt. Dx of Parkinsons is being considered , however adjustment in psych meds proposed by Neurology to see effect of this, has close follow up with Neurology also C/o uncontrolled depression, establishing with new Psych at the [place] in the near future, hopes to have more regular f/u, has been limited in  past, not suicidal or homicidal. Has also had pulmonary f/u , reports improvemented in breathing since using inhalers as directed   ROS Denies recent fever or chills. Denies sinus pressure, nasal congestion, ear pain or sore throat. Denies chest congestion, productive cough or wheezing. Denies chest pains, palpitations and leg swelling Denies abdominal pain, nausea, vomiting,diarrhea or constipation.   Denies dysuria, frequency, hesitancy or incontinence. Denies joint pain, swelling and limitation in mobility. Denies headaches, seizures, numbness, or tingling. . Denies skin break down or rash.   PE  BP 110/74 (BP Location: Right Arm, Patient Position: Sitting, Cuff Size: Large)   Pulse 73   Ht 5\' 10"  (1.778 m)   Wt 208 lb 0.6 oz (94.4 kg)   SpO2 95%   BMI 29.85 kg/m   Patient alert and oriented and in no cardiopulmonary distress.  HEENT: No facial asymmetry, EOMI,     Neck decreased though adequate ROM .  Chest: Clear to auscultation bilaterally.  CVS: S1, S2 no murmurs, no S3.Regular rate.  ABD: Soft non tender.   Ext: No edema  MS:  Adequate though reduced  ROM spine, shoulders, hips and knees.  Skin: Intact, no ulcerations or rash noted.  Psych: Good eye contact, flat  affect. Memory intact not anxious or depressed appearing.  CNS: CN 2-12 intact, power,  normal throughout.no focal deficits noted.tremor at rest wih pill rollin noted   Assessment & Plan  Chronic asthma, mild persistent, uncomplicated Improved  has f/u with pulmonary, management per pulmonary  Depression, major, single episode, in partial remission (HCC) Ynontrolled , has appt with new Psych at the [place], not suicidal or homicidal  Hyperlipidemia LDL goal <100 Hyperlipidemia:Low fat diet discussed and encouraged.   Lipid Panel  Lab Results  Component Value Date   CHOL 146 05/07/2022   HDL 54 05/07/2022   LDLCALC 80 05/07/2022   TRIG 60 05/07/2022   CHOLHDL 2.7 05/07/2022     Updated lab needed at/ before next visit. Controlled hen recently checked  Obesity (BMI 30.0-34.9)  Patient re-educated about  the importance of commitment to a  minimum of 150 minutes of exercise per week as able.  The importance of healthy food choices with portion control discussed, as well as eating regularly and within a 12 hour window most days. The need to choose "clean , green" food 50 to 75% of the time is discussed, as well as to make water the primary drink and set a goal of 64 ounces water daily.       10/17/2022    1:05 PM 10/02/2022   11:11 AM 09/13/2022  11:25 AM  Weight /BMI  Weight 208 lb 0.6 oz 205 lb 212 lb 6.4 oz  Height 5\' 10"  (1.778 m) 5\' 10"  (1.778 m) 5\' 10"  (1.778 m)  BMI 29.85 kg/m2 29.41 kg/m2 30.48 kg/m2      Sinus node dysfunction (HCC) Recent cardiology follow up reviewed and he has an excellent report  Tremor of both hands Tentative  dx of parkinson's will await final from Neurology, currently on ne specific medication

## 2022-10-21 NOTE — Assessment & Plan Note (Signed)
  Patient re-educated about  the importance of commitment to a  minimum of 150 minutes of exercise per week as able.  The importance of healthy food choices with portion control discussed, as well as eating regularly and within a 12 hour window most days. The need to choose "clean , green" food 50 to 75% of the time is discussed, as well as to make water the primary drink and set a goal of 64 ounces water daily.       10/17/2022    1:05 PM 10/02/2022   11:11 AM 09/13/2022   11:25 AM  Weight /BMI  Weight 208 lb 0.6 oz 205 lb 212 lb 6.4 oz  Height 5\' 10"  (1.778 m) 5\' 10"  (1.778 m) 5\' 10"  (1.778 m)  BMI 29.85 kg/m2 29.41 kg/m2 30.48 kg/m2

## 2022-10-23 NOTE — Progress Notes (Signed)
 Remote pacemaker transmission.   

## 2022-10-26 ENCOUNTER — Ambulatory Visit (INDEPENDENT_AMBULATORY_CARE_PROVIDER_SITE_OTHER): Payer: Medicare Other | Admitting: Urology

## 2022-10-26 VITALS — BP 91/60 | HR 76

## 2022-10-26 DIAGNOSIS — N138 Other obstructive and reflux uropathy: Secondary | ICD-10-CM

## 2022-10-26 DIAGNOSIS — N401 Enlarged prostate with lower urinary tract symptoms: Secondary | ICD-10-CM | POA: Diagnosis not present

## 2022-10-26 DIAGNOSIS — R351 Nocturia: Secondary | ICD-10-CM | POA: Diagnosis not present

## 2022-10-26 LAB — URINALYSIS, ROUTINE W REFLEX MICROSCOPIC
Bilirubin, UA: NEGATIVE
Glucose, UA: NEGATIVE
Ketones, UA: NEGATIVE
Leukocytes,UA: NEGATIVE
Nitrite, UA: NEGATIVE
Protein,UA: NEGATIVE
RBC, UA: NEGATIVE
Specific Gravity, UA: 1.02 (ref 1.005–1.030)
Urobilinogen, Ur: 0.2 mg/dL (ref 0.2–1.0)
pH, UA: 6 (ref 5.0–7.5)

## 2022-10-26 MED ORDER — TAMSULOSIN HCL 0.4 MG PO CAPS: 0.4000 mg | ORAL_CAPSULE | Freq: Two times a day (BID) | ORAL | 3 refills | Status: DC

## 2022-10-26 NOTE — Progress Notes (Unsigned)
10/26/2022 1:06 PM   Terry Love 18-Jun-1981 [id number]  Referring provider: Kerri Perches, MD [address] [place],  [place] [address]  No chief complaint on file.   HPI: IPSS 14 QOL 3 on flomax 0.4mg . He has starting/stopping of his stream. He has worsening urinary urgency when he hears running water. Urine stream fair.    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 Rehman:Examination performed to cecum Pan colonic diverticulosis/2 small polyps ablated via cold biopsy from transverse colon/External hemorrhoids, benign polyps   COLONOSCOPY N/A 06/29/2013   Procedure: COLONOSCOPY;  Surgeon: Corbin Ade, MD;  Location: [facility];  Service: Endoscopy;  Laterality: N/A;  10:45   EYE SURGERY     left eye cataracts    PACEMAKER IMPLANT N/A 03/24/2018   Procedure: PACEMAKER IMPLANT;  Surgeon: Marinus Maw, MD;  Location: [facility];  Service: Cardiovascular;  Laterality: N/A;    Home Medications:  Allergies as of 10/26/2022   No Known Allergies      Medication List        Accurate as of October 26, 2022  1:06 PM. If you have any questions, ask your nurse or doctor.          albuterol 108 (90 Base) MCG/ACT inhaler Commonly known as: Ventolin HFA INHALE 2 PUFFS INTO THE LUNGS EVERY 4 (FOUR) HOURS AS NEEDED FOR WHEEZING OR SHORTNESS OF BREATH   ascorbic acid 500 MG tablet Commonly known as: VITAMIN C Take 500 mg by mouth at  bedtime.   aspirin EC 81 MG tablet Take 81 mg by mouth at bedtime.   atorvastatin 40 MG tablet Commonly known as: LIPITOR Take 1 tablet (40 mg total) by mouth at bedtime.   budesonide-formoterol 160-4.5 MCG/ACT inhaler Commonly known as: Symbicort Take 2 puffs first thing in am and then another 2 puffs about 12 hours later.   calcium-vitamin D 500-200 MG-UNIT tablet Commonly known as: OSCAL WITH D Take 1 tablet by mouth 2 (two) times daily.   carboxymethylcellulose 0.5 % Soln Commonly known as: REFRESH PLUS INSTILL 1 DROP IN EACH EYE FOUR TIMES A DAY   dorzolamide-timolol 2-0.5 % ophthalmic solution Commonly known as: COSOPT INSTILL 1 DROP IN [facility] EYE TWO TIMES A DAY   famotidine 20 MG tablet Commonly known as: PEPCID TAKE 1 TABLET BY MOUTH EVERY DAY AFTER DINNER   furosemide 20 MG tablet Commonly known as: LASIX Take 1 tablet by mouth daily. May take an additional tablet for fluid/swelling.   latanoprost 0.005 % ophthalmic solution Commonly known as: XALATAN Place 1 drop into both eyes at bedtime.   montelukast 10 MG tablet Commonly known as: SINGULAIR TAKE 1 TABLET BY MOUTH EVERYDAY AT BEDTIME   multivitamin with minerals Tabs tablet Take 1 tablet by  mouth at bedtime.   omeprazole 40 MG capsule Commonly known as: PRILOSEC TAKE 1 CAPSULE (40 MG TOTAL) BY MOUTH DAILY.   risperiDONE 0.5 MG disintegrating tablet Commonly known as: RISPERDAL M-TABS Take 0.25 mg by mouth at bedtime.   Spacer/Aero-Holding Rudean Curt 2 puffs by Does not apply route in the morning and at bedtime.   tamsulosin 0.4 MG Caps capsule Commonly known as: FLOMAX Take 1 capsule (0.4 mg total) by mouth daily.   venlafaxine XR 150 MG 24 hr capsule Commonly known as: EFFEXOR-XR Take 1 capsule by mouth every morning.   Vitamin D (Ergocalciferol) 1.25 MG (50000 UNIT) Caps capsule Commonly known as: DRISDOL TAKE 1 CAPSULE (50,000 UNITS TOTAL) BY MOUTH EVERY MONDAY IN THE MORNING         Allergies: No Known Allergies  Family History: Family History  Problem Relation Age of Onset   Colon cancer Mother        diagnosed at age 52   Aneurysm Father    Aneurysm Brother    Stroke Brother     Social History:  reports that he has never smoked. He quit smokeless tobacco use about 6 years ago.  His smokeless tobacco use included chew. He reports that he does not drink alcohol and does not use drugs.  ROS: All other review of systems were reviewed and are negative except what is noted above in HPI  Physical Exam: BP 91/60   Pulse 76   Constitutional:  Alert and oriented, No acute distress. HEENT: [place] AT, moist mucus membranes.  Trachea midline, no masses. Cardiovascular: No clubbing, cyanosis, or edema. Respiratory: Normal respiratory effort, no increased work of breathing. GI: Abdomen is soft, nontender, nondistended, no abdominal masses GU: No CVA tenderness.  Lymph: No cervical or inguinal lymphadenopathy. Skin: No rashes, bruises or suspicious lesions. Neurologic: Grossly intact, no focal deficits, moving all 4 extremities. Psychiatric: Normal mood and affect.  Laboratory Data: Lab Results  Component Value Date   WBC 5.1 09/14/2022   HGB 12.5 (L) 09/14/2022   HCT 38.3 09/14/2022   MCV 91 09/14/2022   PLT 284 09/14/2022    Lab Results  Component Value Date   CREATININE 0.81 05/07/2022    Lab Results  Component Value Date   PSA <0.1 12/01/2019   PSA 0.2 09/08/2018   PSA 0.1 09/03/2017    No results found for: "TESTOSTERONE"  Lab Results  Component Value Date   HGBA1C 5.7 (H) 07/11/2022    Urinalysis    Component Value Date/Time   APPEARANCEUR Clear 10/27/2021 1240   GLUCOSEU Negative 10/27/2021 1240   BILIRUBINUR Negative 10/27/2021 1240   PROTEINUR Negative 10/27/2021 1240   UROBILINOGEN 0.2 11/24/2012 1350   NITRITE Negative 10/27/2021 1240   LEUKOCYTESUR Negative 10/27/2021 1240    Lab Results  Component Value Date   LABMICR  Comment 10/27/2021    Pertinent Imaging: *** No results found for this or any previous visit.  No results found for this or any previous visit.  No results found for this or any previous visit.  No results found for this or any previous visit.  No results found for this or any previous visit.  No valid procedures specified. No results found for this or any previous visit.  No results found for this or any previous visit.   Assessment & Plan:    1. Benign prostatic hyperplasia with urinary obstruction *** - Urinalysis, Routine w reflex microscopic  2. Nocturia ***   No follow-ups on file.  Wilkie Aye, MD  [facility] [place]

## 2022-10-30 ENCOUNTER — Encounter: Payer: Self-pay | Admitting: Urology

## 2022-10-30 ENCOUNTER — Ambulatory Visit: Payer: Medicare Other | Admitting: Internal Medicine

## 2022-10-30 NOTE — Patient Instructions (Signed)

## 2022-10-31 ENCOUNTER — Encounter: Payer: Self-pay | Admitting: Internal Medicine

## 2022-10-31 ENCOUNTER — Ambulatory Visit (INDEPENDENT_AMBULATORY_CARE_PROVIDER_SITE_OTHER): Payer: Medicare Other | Admitting: Internal Medicine

## 2022-10-31 VITALS — BP 106/63 | HR 70 | Ht 70.0 in | Wt 207.0 lb

## 2022-10-31 DIAGNOSIS — H409 Unspecified glaucoma: Secondary | ICD-10-CM | POA: Insufficient documentation

## 2022-10-31 DIAGNOSIS — K219 Gastro-esophageal reflux disease without esophagitis: Secondary | ICD-10-CM | POA: Insufficient documentation

## 2022-10-31 DIAGNOSIS — J449 Chronic obstructive pulmonary disease, unspecified: Secondary | ICD-10-CM | POA: Insufficient documentation

## 2022-10-31 DIAGNOSIS — J4530 Mild persistent asthma, uncomplicated: Secondary | ICD-10-CM | POA: Diagnosis not present

## 2022-10-31 DIAGNOSIS — N400 Enlarged prostate without lower urinary tract symptoms: Secondary | ICD-10-CM | POA: Insufficient documentation

## 2022-10-31 DIAGNOSIS — R251 Tremor, unspecified: Secondary | ICD-10-CM | POA: Insufficient documentation

## 2022-10-31 DIAGNOSIS — F32A Depression, unspecified: Secondary | ICD-10-CM | POA: Insufficient documentation

## 2022-10-31 NOTE — Patient Instructions (Signed)
No change in medications   Please schedule a follow up visit in 6 months but call sooner if needed > bring inhalers with you

## 2022-10-31 NOTE — Assessment & Plan Note (Addendum)
Onset ? Around 2012 at wt = 246  -  PFTs 09/05/16 FVC 2.90 (84%), FEV1 2.32 (92%), F/F 80, TLC 82%, DLCO 79% Minimal reduction in diffusion capacity.  - 12/18/2021    continue symbicort 160 - 03/16/2022   changed to  generic symb 160  - 06/21/2022 spacer ordered > not being used as of 09/13/2022 - 09/13/2022  continue symb 160 but work on technique "like a golfer warms up" - FENO 09/13/2022  = 18 despite very poor hfa technique  - Allergy screen 09/13/22  >  Eos 0. 2/  IgE  122 - 10/31/2022  After extensive coaching inhaler device,  effectiveness =    80% (slt delayed trigger)   No evidence of asthma at present and doing much better with symbicort 160 but still misunderstanding when to use saba  Advised: Re SABA :  I spent extra time with pt today reviewing appropriate use of albuterol for prn use on exertion with the following points: 1) saba is for relief of sob that does not improve by walking a slower pace or resting but rather if the pt does not improve after trying this first. 2) If the pt is convinced, as many are, that saba helps recover from activity faster then it's easy to tell if this is the case by re-challenging : ie stop, take the inhaler, then p 5 minutes try the exact same activity (intensity of workload) that just caused the symptoms and see if they are substantially diminished or not after saba 3) if there is an activity that reproducibly causes the symptoms, try the saba 15 min before the activity on alternate days   If in fact the saba really does help, then fine to continue to use it prn but advised may need to look closer at the maintenance regimen (for now symbiocrt 160) being used to achieve better control of airways disease with exertion.     Each maintenance medication was reviewed in detail including emphasizing most importantly the difference between maintenance and prns and under what circumstances the prns are to be triggered using an action plan format where  appropriate.  Total time for H and P, chart review, counseling, reviewing HFA device(s) and generating customized AVS unique to this office visit / same day charting = 30 min

## 2022-10-31 NOTE — Progress Notes (Addendum)
Terry Love, male    DOB: 1936/02/11  MRN: [id number]   Brief patient profile:   59 yobm  never smoker/retired in cig factory referred to pulmonary clinic in [place]  05/09/2021 by Dr Isaiah Serge for asthma f/u - onset was  2012   W/u as of 05/09/2021 1st [place]   PFTs 09/05/16 FVC 2.90 (84%), FEV1 2.32 (92%), F/F 80, TLC 82%, DLCO 79% Minimal reduction in diffusion capacity.  FENO 09/05/16- 54  Labs: CBC with diff 09/05/16- WBC 8.2, 5.4% eos, absolute eso count 460 Blood allergy screen  09/05/16- negative RAST, IgE 150  Cardiac: Echo 03/05/2018 LVH, EF 60 to 65%, grade 1 diastolic dysfunction      History of Present Illness  05/09/2021  Pulmonary/ 1st office eval/ Naoko Diperna / [place] [facility] re asthma maint on symbicort 160 with poor techique  Chief Complaint  Patient presents with   New Patient (Initial Visit)    Has seen Dr. Isaiah Serge in the past is switching to [place] office for location.   Concerns about breathing   Using symbicort 2 puffs in am and 2 puffs in pm. Wants to discuss different inhaler costs.   Dyspnea:  walking about 50 ft and stop x sev years  Cough: not a problem  Sleep: bed is flat/ on side s am flare but sometimes wheeze per wife  SABA use:  several times a week but never prechallenges  Rec Continue omeprazole 40 mg Take 30-60 min before first meal of the day  Add pepcid 20 mg after supper  Work on inhaler technique:   Ok to try albuterol 15 min before an activity (on alternating days)  that you know would usually make you short of breath GERD diet reviewed, bed blocks rec  Please schedule a follow up office visit in 6 weeks, call sooner if needed with all medications /inhalers/ solutions in hand     12/18/2021  f/u ov/[place] office/Erick Murin re: asthma maint on symbicort 160  2bid with suboptimal hfa Chief Complaint  Patient presents with   Follow-up    Feels breathing is getting better    Dyspnea:  easier than it was to do adls but not very  active Cough: none  Sleeping: on back/ props up on  pillows and sleepis fine SABA use: rarely now  02: none  Rec Plan A = Automatic = Always=    Symbicort  160 Take 2 puffs first thing in am and then another 2 puffs about 12 hours later.   Work on inhaler technique:     - remember how golfers take practice swings - use your empty symbicort for training before each use  Plan B = Backup (to supplement plan A, not to replace it) Only use your albuterol inhaler as a rescue medication Please schedule a follow up visit in 3 months but call sooner if needed- bring your inhalers     03/16/2022  f/u ov/[place] office/Rayhaan Huster re: asthma maint on symbicort 160  with moderate hfa effectiveness  Chief Complaint  Patient presents with   Follow-up    Better since last ov  Needs to discuss inhalers. Letter from insurance stating inhaler wont be covered in 2024 and need to change to generic breo or advair   Dyspnea:  across the parking lot slowly  - does better pushing grocery cart  Cough: none  Sleeping: props up with bed blocks  SABA use:  one -  two times  02: none  Rec Generic  Symbicort 160 Take  2 puffs first thing in am and then another 2 puffs about 12 hours later.  Only use your albuterol as a rescue medication  Also  Ok to try albuterol 15 min before an activity (on alternating days)  that you know would usually make you short of breath and see if it makes any difference and if makes none then don't take albuterol after activity unless you can't catch your breath as this means it's the resting that helps, not the albuterol.       09/13/2022 6 m   f/u ov/[place] office/Doron Shake re: asthma maint on symbicort 160 2 puffs   Chief Complaint  Patient presents with   Follow-up    He has noticed increased DOE since the last visit. He was winded walking from lobby to exam room today.   Dyspnea:  walk car to house in heat or shower  Cough: none  Sleeping: bed blocks  SABA use: twice a day never at  night  02: none  Rec Ok to try albuterol 15 min before an activity (on alternating days)  that you know would usually make you short of breath  Work on inhaler technique:  Please schedule a follow up office visit in 6 weeks, call sooner if needed with all medications /inhalers/ solutions in hand  Allergy screen 09/13/22 > Eos 0. 2/ IgE 122    10/31/2022  f/u ov/[place] [facility]/Kinberly Perris re: asthma  maint on symbicort 160 2bid   Chief Complaint  Patient presents with   DOE   Chronic Asthma  Dyspnea:  long walk thru [place] one prior to OV  slower pace than others  Cough: none  Sleeping: bed  blocks >> no resp cc  SABA use: once or twice daily , never pre or rechallenges  02: none      No obvious day to day or daytime variability or assoc excess/ purulent sputum or mucus plugs or hemoptysis or cp or chest tightness, subjective wheeze or overt sinus or hb symptoms.   Sleeping  without nocturnal  or early am exacerbation  of respiratory  c/o's or need for noct saba. Also denies any obvious fluctuation of symptoms with weather or environmental changes or other aggravating or alleviating factors except as outlined above   No unusual exposure hx or h/o childhood pna/ asthma or knowledge of premature birth.  Current Allergies, Complete Past Medical History, Past Surgical History, Family History, and Social History were reviewed in [employer] record.  ROS  The following are not active complaints unless bolded Hoarseness, sore throat, dysphagia, dental problems, itching, sneezing,  nasal congestion or discharge of excess mucus or purulent secretions, ear ache,   fever, chills, sweats, unintended wt loss or wt gain, classically pleuritic or exertional cp,  orthopnea pnd or arm/hand swelling  or leg swelling, presyncope, palpitations, abdominal pain, anorexia, nausea, vomiting, diarrhea  or change in bowel habits or change in bladder habits, change in stools or change in urine,  dysuria, hematuria,  rash, arthralgias, visual complaints, headache, numbness, weakness or ataxia or problems with walking or coordination,  change in mood or  memory.        Current Meds  Medication Sig   albuterol (VENTOLIN HFA) 108 (90 Base) MCG/ACT inhaler INHALE 2 PUFFS INTO THE LUNGS EVERY 4 (FOUR) HOURS AS NEEDED FOR WHEEZING OR SHORTNESS OF BREATH   aspirin 81 MG EC tablet Take 81 mg by mouth at bedtime.   atorvastatin (LIPITOR) 40 MG tablet Take 1 tablet (40  mg total) by mouth at bedtime.   budesonide-formoterol (SYMBICORT) 160-4.5 MCG/ACT inhaler Take 2 puffs first thing in am and then another 2 puffs about 12 hours later.   calcium-vitamin D (OSCAL WITH D) 500-200 MG-UNIT tablet Take 1 tablet by mouth 2 (two) times daily.   carboxymethylcellulose (REFRESH PLUS) 0.5 % SOLN INSTILL 1 DROP IN EACH EYE FOUR TIMES A DAY   dorzolamide-timolol (COSOPT) 22.3-6.8 MG/ML ophthalmic solution INSTILL 1 DROP IN [facility] EYE TWO TIMES A DAY   famotidine (PEPCID) 20 MG tablet TAKE 1 TABLET BY MOUTH EVERY DAY AFTER DINNER   furosemide (LASIX) 20 MG tablet Take 1 tablet by mouth daily. May take an additional tablet for fluid/swelling.   latanoprost (XALATAN) 0.005 % ophthalmic solution Place 1 drop into both eyes at bedtime.   montelukast (SINGULAIR) 10 MG tablet TAKE 1 TABLET BY MOUTH EVERYDAY AT BEDTIME   Multiple Vitamin (MULTIVITAMIN WITH MINERALS) TABS tablet Take 1 tablet by mouth at bedtime.   omeprazole (PRILOSEC) 40 MG capsule TAKE 1 CAPSULE (40 MG TOTAL) BY MOUTH DAILY.   risperiDONE (RISPERDAL M-TABS) 0.5 MG disintegrating tablet Take 0.25 mg by mouth at bedtime.   Spacer/Aero-Holding Chambers DEVI 2 puffs by Does not apply route in the morning and at bedtime.   tamsulosin (FLOMAX) 0.4 MG CAPS capsule Take 1 capsule (0.4 mg total) by mouth in the morning and at bedtime.   venlafaxine XR (EFFEXOR-XR) 150 MG 24 hr capsule Take 1 capsule by mouth every morning.   vitamin C (ASCORBIC ACID) 500 MG  tablet Take 500 mg by mouth at bedtime.    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   Obesity    Seasonal allergies         Objective:    Wts  10/31/2022        207  09/13/2022         212  03/16/2022       215  12/18/2021       215   06/21/21 217 lb 6.4 oz (98.6 kg)  06/16/21 218 lb (98.9 kg)  06/14/21 219 lb (99.3 kg)     Vital signs reviewed  10/31/2022  - Note at rest 02 sats  93% on RA   General appearance:    soft spoken amb bm nad     HEENT : Oropharynx  clear / full dentures       NECK :  without  apparent JVD/ palpable Nodes/TM    LUNGS: no acc muscle use,  Nl contour chest which is clear to A and P bilaterally without cough on insp or exp maneuvers   CV:  RRR  no s3 or murmur or increase in P2, and no edema   ABD:  soft and nontender    MS:  Nl gait/ ext warm without deformities Or obvious joint restrictions  calf tenderness, cyanosis or clubbing    SKIN: warm and dry without lesions  NEURO:  alert, approp, nl sensorium with  no motor or cerebellar deficits apparent. Slt tremor          Assessment

## 2022-11-12 ENCOUNTER — Other Ambulatory Visit: Payer: Self-pay | Admitting: Family Medicine

## 2022-11-14 ENCOUNTER — Other Ambulatory Visit: Payer: Self-pay | Admitting: Family Medicine

## 2022-11-15 ENCOUNTER — Telehealth: Payer: Self-pay | Admitting: Family Medicine

## 2022-11-15 NOTE — Telephone Encounter (Signed)
Patient tested positive for Covid wanting to know if there's anything that can be sent in for him. Please advise Thank you

## 2022-11-16 ENCOUNTER — Other Ambulatory Visit (HOSPITAL_COMMUNITY)
Admission: RE | Admit: 2022-11-16 | Discharge: 2022-11-16 | Disposition: A | Discharge status: Home / Self Care | Payer: Medicare Other | Source: Ambulatory Visit | Attending: Family Medicine | Admitting: Family Medicine

## 2022-11-16 ENCOUNTER — Other Ambulatory Visit: Payer: Self-pay

## 2022-11-16 DIAGNOSIS — U071 COVID-19: Secondary | ICD-10-CM

## 2022-11-16 LAB — BASIC METABOLIC PANEL
Anion gap: 10 (ref 5–15)
BUN: 12 mg/dL (ref 8–23)
CO2: 23 mmol/L (ref 22–32)
Calcium: 9 mg/dL (ref 8.9–10.3)
Chloride: 102 mmol/L (ref 98–111)
Creatinine, Ser: 0.96 mg/dL (ref 0.61–1.24)
GFR, Estimated: >60 mL/min (ref >60)
Glucose, Bld: 108 mg/dL — ABNORMAL HIGH (ref 70–99)
Potassium: 3.8 mmol/L (ref 3.5–5.1)
Sodium: 135 mmol/L (ref 135–145)

## 2022-11-16 MED ORDER — NIRMATRELVIR/RITONAVIR (PAXLOVID)TABLET: 3.0000 | ORAL_TABLET | Freq: Two times a day (BID) | ORAL | 0 refills | Status: AC

## 2022-11-16 NOTE — Telephone Encounter (Signed)
5 day course of paxlovid, no renal adjustment needed has been sent to the pharmacy

## 2022-11-16 NOTE — Telephone Encounter (Signed)
Patient aware.

## 2022-11-16 NOTE — Telephone Encounter (Signed)
Pt was coughing some Wed and felt very fatigued. Tested positive for Covid yesterday morning. Has been using some alker seltzer cold. Not feeling very bad but still has a cough. Wants to know if he needs anything sent it for it. Pls advise

## 2022-11-16 NOTE — Telephone Encounter (Signed)
Ordered and patient aware to go before 12

## 2022-11-20 ENCOUNTER — Encounter: Payer: Self-pay | Admitting: Internal Medicine

## 2022-11-27 ENCOUNTER — Other Ambulatory Visit: Payer: Self-pay | Admitting: Urology

## 2022-11-28 ENCOUNTER — Other Ambulatory Visit: Payer: Self-pay | Admitting: Family Medicine

## 2022-12-02 ENCOUNTER — Other Ambulatory Visit: Payer: Self-pay | Admitting: Family Medicine

## 2022-12-02 ENCOUNTER — Other Ambulatory Visit: Payer: Self-pay | Admitting: Cardiology

## 2022-12-14 ENCOUNTER — Ambulatory Visit (INDEPENDENT_AMBULATORY_CARE_PROVIDER_SITE_OTHER): Payer: Medicare Other

## 2022-12-14 DIAGNOSIS — Z23 Encounter for immunization: Secondary | ICD-10-CM | POA: Diagnosis not present

## 2022-12-18 ENCOUNTER — Encounter: Payer: Self-pay | Admitting: Internal Medicine

## 2022-12-18 ENCOUNTER — Ambulatory Visit (INDEPENDENT_AMBULATORY_CARE_PROVIDER_SITE_OTHER): Payer: Medicare Other | Admitting: Internal Medicine

## 2022-12-18 VITALS — BP 124/70 | HR 69 | Temp 98.6°F | Ht 70.0 in | Wt 205.8 lb

## 2022-12-18 DIAGNOSIS — K219 Gastro-esophageal reflux disease without esophagitis: Secondary | ICD-10-CM

## 2022-12-18 NOTE — Patient Instructions (Signed)
It was good to see you again today!  Recommend you continue Prilosec or omeprazole 40 mg daily 30 minutes before breakfast indefinitely  Recommend healthy lifestyle.  Consider going to the [facility] and doing pool exercises 3 times a week assist with weight management.  We will plan to see you back on a as needed basis.  Please do not hesitate to call if any concerns should arise.

## 2022-12-18 NOTE — Progress Notes (Unsigned)
Primary Care Physician:  Kerri Perches, MD Primary Gastroenterologist:  Dr. Jena Gauss  Pre-Procedure History & Physical: HPI:  Terry Love is a 87 y.o. male here for follow-up of GERD.  Seen for bloating last year.  Those symptoms resolved without any significant intervention.  No rectal bleeding or melena.  GERD well-controlled on omeprazole 40 mg daily.  No dysphagia.  Continues to have a challenge with trying to lose weight.  He states he is on "rabbit food" at home.  Very little physical activity.  He is accompanied by his wife today.  He is up-to-date on colonoscopy small adenoma in the past.  Last colonoscopy 2015. Has a pacemaker.  Follows up with cardiology, Dr. Lodema Hong regularly.  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 Rehman:Examination performed to cecum Pan colonic diverticulosis/2 small polyps ablated via cold biopsy from transverse colon/External hemorrhoids, benign polyps   COLONOSCOPY N/A 06/29/2013   Procedure: COLONOSCOPY;  Surgeon: Corbin Ade, MD;  [facility];  Service: Endoscopy;  Laterality: N/A;  10:45   EYE SURGERY     left eye cataracts    PACEMAKER IMPLANT N/A 03/24/2018   Procedure: PACEMAKER IMPLANT;  Surgeon: Marinus Maw, MD;  [facility]: [facility];  Service: Cardiovascular;  Laterality: N/A;    Prior to Admission medications   Medication Sig Start Date End Date Taking? Authorizing Provider  albuterol (VENTOLIN HFA) 108 (90 Base) MCG/ACT  inhaler INHALE 2 PUFFS INTO THE LUNGS EVERY 4 (FOUR) HOURS AS NEEDED FOR WHEEZING OR SHORTNESS OF BREATH 08/28/21  Yes Nyoka Cowden, MD  aspirin 81 MG EC tablet Take 81 mg by mouth at bedtime.   Yes [provider]  atorvastatin (LIPITOR) 40 MG tablet TAKE 1 TABLET AT BEDTIME 11/12/22  Yes Kerri Perches, MD  budesonide-formoterol [facility]) 160-4.5 MCG/ACT inhaler Take 2 puffs first thing in am and then another 2 puffs about 12 hours later. 03/16/22  Yes Nyoka Cowden, MD  calcium-vitamin D (OSCAL WITH D) 500-200 MG-UNIT tablet Take 1 tablet by mouth 2 (two) times daily. 10/01/16  Yes Kerri Perches, MD  carboxymethylcellulose (REFRESH PLUS) 0.5 % SOLN INSTILL 1 DROP IN [facility] EYE FOUR TIMES A DAY 01/02/21  Yes [provider]  dorzolamide-timolol (COSOPT) 22.3-6.8 MG/ML ophthalmic solution INSTILL 1 DROP IN [facility] EYE TWO TIMES A DAY 01/02/21  Yes [provider]  famotidine (PEPCID) 20 MG tablet TAKE 1 TABLET BY MOUTH EVERY DAY AFTER DINNER 04/30/22  Yes Nyoka Cowden, MD  furosemide (LASIX) 20 MG tablet TAKE 1 TABLET BY MOUTH DAILY. MAY TAKE AN ADDITIONAL TABLET FOR FLUID/SWELLING. 12/03/22  Yes Branch, Dorothe Pea, MD  latanoprost (XALATAN) 0.005 % ophthalmic solution Place 1 drop into both eyes at bedtime.   Yes [provider]  montelukast (SINGULAIR)  10 MG tablet TAKE 1 TABLET BY MOUTH EVERYDAY AT BEDTIME 12/03/22  Yes Kerri Perches, MD  Multiple Vitamin (MULTIVITAMIN WITH MINERALS) TABS tablet Take 1 tablet by mouth at bedtime.   Yes [provider]  omeprazole (PRILOSEC) 40 MG capsule TAKE 1 CAPSULE (40 MG TOTAL) BY MOUTH DAILY. 11/28/22  Yes Kerri Perches, MD  risperiDONE (RISPERDAL M-TABS) 0.5 MG disintegrating tablet Take 0.25 mg by mouth at bedtime.   Yes [provider]  Spacer/Aero-Holding Chambers DEVI 2 puffs by Does not apply route in the morning and at bedtime. 06/25/22  Yes Nyoka Cowden, MD  tamsulosin (FLOMAX)  0.4 MG CAPS capsule TAKE 1 CAPSULE BY MOUTH EVERY DAY 11/27/22  Yes McKenzie, Mardene Celeste, MD  venlafaxine XR (EFFEXOR-XR) 150 MG 24 hr capsule Take 1 capsule by mouth every morning. 12/07/20  Yes [provider]  vitamin C (ASCORBIC ACID) 500 MG tablet Take 500 mg by mouth at bedtime.    Yes [provider]  Vitamin D, Ergocalciferol, (DRISDOL) 1.25 MG (50000 UNIT) CAPS capsule TAKE 1 CAPSULE (50,000 UNITS TOTAL) BY MOUTH EVERY MONDAY IN THE MORNING 11/19/22  Yes Kerri Perches, MD    Allergies as of 12/18/2022   (No Known Allergies)    Family History  Problem Relation Age of Onset   Colon cancer Mother        diagnosed at age 53   Aneurysm Father    Aneurysm Brother    Stroke Brother     Social History   Socioeconomic History   Marital status: Married    Spouse name: Haynes Dage    Number of children: 0   Years of education: 12   Highest education level: 12th grade  Occupational History   Occupation: retired    Comment: [employer];  Tobacco Use   Smoking status: Never   Smokeless tobacco: Former    Types: Chew    Quit date: 03/13/2016  Vaping Use   Vaping status: Never Used  Substance and Sexual Activity   Alcohol use: No    Alcohol/week: 0.0 standard drinks of alcohol   Drug use: No   Sexual activity: Not Currently  Other Topics Concern   Not on file  Social History Narrative   Right handed    Social Determinants of Health   Financial Resource Strain: Low Risk  (03/19/2022)   Overall Financial Resource Strain (CARDIA)    Difficulty of Paying Living Expenses: Not hard at all  Food Insecurity: No Food Insecurity (03/19/2022)   Hunger Vital Sign    Worried About Running Out of Food in the Last Year: Never true    Ran Out of Food in the Last Year: Never true  Transportation Needs: No Transportation Needs (03/19/2022)   PRAPARE - Administrator, Civil Service (Medical): No    Lack of Transportation (Non-Medical): No  Physical  Activity: Insufficiently Active (03/19/2022)   Exercise Vital Sign    Days of Exercise per Week: 3 days    Minutes of Exercise per Session: 20 min  Stress: No Stress Concern Present (03/19/2022)   [employer] of Occupational Health - Occupational Stress Questionnaire    Feeling of Stress : Not at all  Social Connections: Moderately Integrated (03/19/2022)   Social Connection and Isolation Panel [NHANES]    Frequency of Communication with Friends and Family: More than three times a week    Frequency of Social Gatherings with Friends and Family: More than three times a week  Attends Religious Services: More than 4 times per year    Active Member of Clubs or Organizations: No    Attends Banker Meetings: Never    Marital Status: Married  Catering manager Violence: Not At Risk (03/19/2022)   Humiliation, Afraid, Rape, and Kick questionnaire    Fear of Current or Ex-Partner: No    Emotionally Abused: No    Physically Abused: No    Sexually Abused: No    Review of Systems: See HPI, otherwise negative ROS  Physical Exam: BP 124/70 (BP Location: Right Arm, Patient Position: Sitting, Cuff Size: Normal)   Pulse 69   Temp 98.6 F (37 C) (Oral)   Ht 5\' 10"  (1.778 m)   Wt 205 lb 12.8 oz (93.4 kg)   SpO2 98%   BMI 29.53 kg/m  General:   Alert,  , pleasant and cooperative in NAD Neck:  Supple; no masses or thyromegaly. No significant cervical adenopathy. Abdomen: Non-distended, normal bowel sounds.  Soft and nontender without appreciable mass or hepatosplenomegaly.   Impression/Plan: 87 year old obese gentleman with longstanding GERD well-controlled on omeprazole.  No alarm symptoms.  In fact, no active GI symptoms at this time.  Concerned about obesity and difficulty in losing weight.  We discussed the multipronged approach.  His lack of regular activity is likely contributing to difficulties in losing weight.  Recommendations:   Recommend you continue Prilosec  or omeprazole 40 mg daily 30 minutes before breakfast indefinitely  Recommend healthy lifestyle.  Consider going to the [facility] and doing pool exercises 3 times a week assist with weight management.  We will plan to back on a as needed basis.  Please do not hesitate to call if any concerns should arise.    Notice: This dictation was prepared with Dragon dictation along with smaller phrase technology. Any transcriptional errors that result from this process are unintentional and may not be corrected upon review.

## 2022-12-31 DIAGNOSIS — Z23 Encounter for immunization: Secondary | ICD-10-CM | POA: Diagnosis not present

## 2023-01-02 ENCOUNTER — Ambulatory Visit: Payer: Medicare Other

## 2023-01-02 DIAGNOSIS — I495 Sick sinus syndrome: Secondary | ICD-10-CM | POA: Diagnosis not present

## 2023-01-02 LAB — CUP PACEART REMOTE DEVICE CHECK
Battery Voltage: 60
Date Time Interrogation Session: 20240925111147
Implantable Lead Connection Status: 753985
Implantable Lead Connection Status: 753985
Implantable Lead Implant Date: 20191216
Implantable Lead Implant Date: 20191216
Implantable Lead Location: 753859
Implantable Lead Location: 753860
Implantable Lead Model: 377
Implantable Lead Model: 377
Implantable Lead Serial Number: 80840322
Implantable Lead Serial Number: 80924555
Implantable Pulse Generator Implant Date: 20191216
Pulse Gen Model: 407145
Pulse Gen Serial Number: 69486427

## 2023-01-07 DIAGNOSIS — E785 Hyperlipidemia, unspecified: Secondary | ICD-10-CM | POA: Diagnosis not present

## 2023-01-07 DIAGNOSIS — E559 Vitamin D deficiency, unspecified: Secondary | ICD-10-CM | POA: Diagnosis not present

## 2023-01-07 DIAGNOSIS — E669 Obesity, unspecified: Secondary | ICD-10-CM | POA: Diagnosis not present

## 2023-01-07 DIAGNOSIS — R7302 Impaired glucose tolerance (oral): Secondary | ICD-10-CM | POA: Diagnosis not present

## 2023-01-08 LAB — LIPID PANEL
Chol/HDL Ratio: 2.8 {ratio} (ref 0.0–5.0)
Cholesterol, Total: 154 mg/dL (ref 100–199)
HDL: 55 mg/dL (ref >39)
LDL Chol Calc (NIH): 86 mg/dL (ref 0–99)
Triglycerides: 66 mg/dL (ref 0–149)
VLDL Cholesterol Cal: 13 mg/dL (ref 5–40)

## 2023-01-08 LAB — CMP14+EGFR
ALT: 18 [IU]/L (ref 0–44)
AST: 25 [IU]/L (ref 0–40)
Albumin: 4.2 g/dL (ref 3.7–4.7)
Alkaline Phosphatase: 68 [IU]/L (ref 44–121)
BUN/Creatinine Ratio: 21 (ref 10–24)
BUN: 17 mg/dL (ref 8–27)
Bilirubin Total: 0.4 mg/dL (ref 0.0–1.2)
CO2: 24 mmol/L (ref 20–29)
Calcium: 9.5 mg/dL (ref 8.6–10.2)
Chloride: 105 mmol/L (ref 96–106)
Creatinine, Ser: 0.82 mg/dL (ref 0.76–1.27)
Globulin, Total: 2.6 g/dL (ref 1.5–4.5)
Glucose: 103 mg/dL — ABNORMAL HIGH (ref 70–99)
Potassium: 4.2 mmol/L (ref 3.5–5.2)
Sodium: 141 mmol/L (ref 134–144)
Total Protein: 6.8 g/dL (ref 6.0–8.5)
eGFR: 86 mL/min/{1.73_m2} (ref >59)

## 2023-01-08 LAB — VITAMIN D 25 HYDROXY (VIT D DEFICIENCY, FRACTURES): Vit D, 25-Hydroxy: 61.9 ng/mL (ref 30.0–100.0)

## 2023-01-08 LAB — HEMOGLOBIN A1C
Est. average glucose Bld gHb Est-mCnc: 111 mg/dL
Hgb A1c MFr Bld: 5.5 % (ref 4.8–5.6)

## 2023-01-09 ENCOUNTER — Ambulatory Visit: Payer: Medicare Other | Attending: Cardiology | Admitting: Cardiology

## 2023-01-09 ENCOUNTER — Encounter: Payer: Self-pay | Admitting: Cardiology

## 2023-01-09 VITALS — BP 116/68 | HR 70 | Ht 70.0 in | Wt 205.0 lb

## 2023-01-09 DIAGNOSIS — R600 Localized edema: Secondary | ICD-10-CM | POA: Diagnosis not present

## 2023-01-09 DIAGNOSIS — Z950 Presence of cardiac pacemaker: Secondary | ICD-10-CM | POA: Diagnosis not present

## 2023-01-09 DIAGNOSIS — R0609 Other forms of dyspnea: Secondary | ICD-10-CM | POA: Insufficient documentation

## 2023-01-09 DIAGNOSIS — I495 Sick sinus syndrome: Secondary | ICD-10-CM | POA: Insufficient documentation

## 2023-01-09 DIAGNOSIS — I351 Nonrheumatic aortic (valve) insufficiency: Secondary | ICD-10-CM | POA: Diagnosis not present

## 2023-01-09 DIAGNOSIS — E782 Mixed hyperlipidemia: Secondary | ICD-10-CM | POA: Insufficient documentation

## 2023-01-09 NOTE — Patient Instructions (Signed)
Medication Instructions:  Your physician recommends that you continue on your current medications as directed. Please refer to the Current Medication list given to you today.  *If you need a refill on your cardiac medications before your next appointment, please call your pharmacy*   Lab Work: NONE   If you have labs (blood work) drawn today and your tests are completely normal, you will receive your results only by: MyChart Message (if you have MyChart) OR A paper copy in the mail If you have any lab test that is abnormal or we need to change your treatment, we will call you to review the results.   Testing/Procedures: Your physician has requested that you have an echocardiogram. Echocardiography is a painless test that uses sound waves to create images of your heart. It provides your doctor with information about the size and shape of your heart and how well your heart's chambers and valves are working. This procedure takes approximately one hour. There are no restrictions for this procedure. Please do NOT wear cologne, perfume, aftershave, or lotions (deodorant is allowed). Please arrive 15 minutes prior to your appointment time.    Follow-Up: At [facility], you and your health needs are our priority.  As part of our continuing mission to provide you with exceptional heart care, we have created designated Provider Care Teams.  These Care Teams include your primary Cardiologist (physician) and Advanced Practice Providers (APPs -  Physician Assistants and Nurse Practitioners) who all work together to provide you with the care you need, when you need it.  We recommend signing up for the patient portal called "MyChart".  Sign up information is provided on this After Visit Summary.  MyChart is used to connect with patients for Virtual Visits (Telemedicine).  Patients are able to view lab/test results, encounter notes, upcoming appointments, etc.  Non-urgent messages can be sent to  your provider as well.   To learn more about what you can do with MyChart, go to [email or web address].    Your next appointment:   6 month(s)  Provider:   You may see Dina Rich, MD or one of the following Advanced Practice Providers on your designated Care Team:   Randall An, PA-C  Jacolyn Reedy, [place]     Other Instructions Thank you for choosing [facility]!

## 2023-01-09 NOTE — Progress Notes (Signed)
Clinical Summary Terry Love is a 87 y.o.male seen today for follow up of the following medical problems.      1. Symptomatic bradycardia - pacemaker followed by Dr Ladona Ridgel    12/2022 normal device check - no recent symptoms.      2. Hyperlipidemia - 10/2020 TC145 TG 55 HDL 53 LDL 80 - 12/2021 TC 130 TG 57 HDL 50 LDL 89 - Jan 2024 TC 146 TG 60 HDL 54 LDL 80 -12/2022 TC 154 TG 66 HDL 55 LDL 86   3. SOB/DOE - extensive cardiac and pulmonary workup over the years as reported below overall benign    - chronic SOB, has prn albuterol which helps.  - no recent edema.   4. Aortic regurgitation - mild AI by 2019 echo, due for repeat imaging.      SH: married 60 years, his wife Terry Love is also a patient.    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     atorvastatin (LIPITOR) 40 MG tablet TAKE 1 TABLET AT BEDTIME 90 tablet 3   budesonide-formoterol (SYMBICORT) 160-4.5 MCG/ACT inhaler Take 2 puffs first thing in am and then another 2 puffs about 12 hours later. 1 each 12   calcium-vitamin D (OSCAL WITH D) 500-200 MG-UNIT tablet Take 1 tablet by mouth 2 (two) times daily. 150 tablet 1   carboxymethylcellulose (REFRESH PLUS) 0.5 % SOLN INSTILL 1 DROP IN EACH EYE FOUR TIMES A DAY     dorzolamide-timolol (COSOPT) 22.3-6.8 MG/ML ophthalmic solution INSTILL 1 DROP IN  [facility] EYE TWO TIMES A DAY     famotidine (PEPCID) 20 MG tablet TAKE 1 TABLET BY MOUTH EVERY DAY AFTER DINNER 90 tablet 3   furosemide (LASIX) 20 MG tablet TAKE 1 TABLET BY MOUTH DAILY. MAY TAKE AN ADDITIONAL TABLET FOR FLUID/SWELLING. 100 tablet 3   latanoprost (XALATAN) 0.005 % ophthalmic solution Place 1 drop into both eyes at bedtime.     montelukast (SINGULAIR) 10 MG tablet TAKE 1 TABLET BY MOUTH EVERYDAY AT BEDTIME 90 tablet 1   Multiple Vitamin (MULTIVITAMIN WITH MINERALS) TABS tablet Take 1 tablet by mouth at bedtime.     omeprazole (PRILOSEC) 40 MG capsule TAKE 1 CAPSULE (40 MG TOTAL) BY MOUTH DAILY. 90 capsule 1   risperiDONE (RISPERDAL M-TABS) 0.5 MG disintegrating tablet Take 0.25 mg by mouth at bedtime.     Spacer/Aero-Holding Chambers DEVI 2 puffs by Does not apply route in the morning and at bedtime. 1 each 0   tamsulosin (FLOMAX) 0.4 MG CAPS capsule TAKE 1 CAPSULE BY MOUTH EVERY DAY 90 capsule 3   venlafaxine XR (EFFEXOR-XR) 150 MG 24 hr capsule Take 1 capsule by mouth  every morning.     vitamin C (ASCORBIC ACID) 500 MG tablet Take 500 mg by mouth at bedtime.      Vitamin D, Ergocalciferol, (DRISDOL) 1.25 MG (50000 UNIT) CAPS capsule TAKE 1 CAPSULE (50,000 UNITS TOTAL) BY MOUTH EVERY MONDAY IN THE MORNING 12 capsule 1   No current facility-administered medications for this visit.     Past Surgical History:  Procedure Laterality Date   carpal tunnel  release right  2006   CATARACT EXTRACTION, BILATERAL     COLONOSCOPY     COLONOSCOPY  01/25/2011   Dr. Rehman:Examination performed to cecum Pan colonic diverticulosis/2 small polyps ablated via cold biopsy from transverse colon/External hemorrhoids, benign polyps   COLONOSCOPY N/A 06/29/2013   Procedure: COLONOSCOPY;  Surgeon: Corbin Ade, MD;  [facility];  Service: Endoscopy;  Laterality: N/A;  10:45   EYE SURGERY     left eye cataracts    PACEMAKER IMPLANT N/A 03/24/2018   Procedure: PACEMAKER IMPLANT;   Surgeon: Marinus Maw, MD;  [facility]: [facility];  Service: Cardiovascular;  Laterality: N/A;     No Known Allergies    Family History  Problem Relation Age of Onset   Colon cancer Mother        diagnosed at age 53   Aneurysm Father    Aneurysm Brother    Stroke Brother      Social History Terry Love reports that he has never smoked. He quit smokeless tobacco use about 6 years ago.  His smokeless tobacco use included chew. Terry Love reports no history of alcohol use.   Review of Systems CONSTITUTIONAL: No weight loss, fever, chills, weakness or fatigue.  HEENT: Eyes: No visual loss, blurred vision, double vision or yellow sclerae.No hearing loss, sneezing, congestion, runny nose or sore throat.  SKIN: No rash or itching.  CARDIOVASCULAR: per hpi RESPIRATORY: No shortness of breath, cough or sputum.  GASTROINTESTINAL: No anorexia, nausea, vomiting or diarrhea. No abdominal pain or blood.  GENITOURINARY: No burning on urination, no polyuria NEUROLOGICAL: No headache, dizziness, syncope, paralysis, ataxia, numbness or tingling in the extremities. No change in bowel or bladder control.  MUSCULOSKELETAL: No muscle, back pain, joint pain or stiffness.  LYMPHATICS: No enlarged nodes. No history of splenectomy.  PSYCHIATRIC: No history of depression or anxiety.  ENDOCRINOLOGIC: No reports of sweating, cold or heat intolerance. No polyuria or polydipsia.  Marland Kitchen   Physical Examination Today's Vitals   01/09/23 1055  BP: 116/68  Pulse: 70  SpO2: 96%  Weight: 205 lb (93 kg)  Height: 5\' 10"  (1.778 m)   Body mass index is 29.41 kg/m.  Gen: resting comfortably, no acute distress HEENT: no scleral icterus, pupils equal round and reactive, no palptable cervical adenopathy,  CV: RRR, no m/rg, no jvd Resp: Clear to auscultation bilaterally GI: abdomen is soft, non-tender, non-distended, normal bowel sounds, no hepatosplenomegaly MSK: extremities are warm, no edema.   Skin: warm, no rash Neuro:  no focal deficits Psych: appropriate affect   Diagnostic Studies  02/2018 echo Study Conclusions   - Left ventricle: The cavity size was normal. Wall thickness was    increased increased in a pattern of mild to moderate LVH.    Systolic function was normal. The estimated ejection fraction was    in the range of 60% to 65%. Wall motion was normal; there were no    regional wall motion abnormalities. Doppler parameters are    consistent with abnormal left ventricular relaxation (grade  1    diastolic dysfunction).  - Aortic valve: Moderately calcified annulus. Trileaflet; mildly    calcified leaflets. There was mild regurgitation.  - Mitral valve: Mildly calcified annulus. There was trivial    regurgitation.  - Right atrium: Central venous pressure (est): 3 mm Hg.  - Atrial septum: No defect or patent foramen ovale was identified.  - Tricuspid valve: There was trivial regurgitation.  - Pulmonary arteries: Systolic pressure could not be accurately    estimated.  - Pericardium, extracardiac: There was no pericardial effusion.      02/2018 GXT Nondiagnostic exercise treadmill test for ischemia due to blunted heart rate response, however consistent with chronotropic incompetence with patient achieving only 57% of MPHR. Test discontinued due to patient fatigue and shortness of breath. Blood pressure demonstrated a normal response to exercise.       03/2016 nuclear stress There was no ST segment deviation noted during stress. The study is normal. No ischemia or scar. This is a low risk study. Nuclear stress EF: 70%.     08/2016 PFTs Minimal diffusion defect, no obstruction            Assessment and Plan   1. Pacemaker/symptomatic bradycardia/sinus bradycardia - recent normal device check, no symptoms - continue to monitor      2. DOE - extensive workup over the last few years fairly benign - likely significant component of  deconditioning, -we will continue to monitor   3. LE edema - controlled, continue lasix   4. Hyperlipidemia - lipids are at goal, continue current meds  5. Aortic regurgitation - mild AI by 2019 echo, repeat echo for ongoing surveillance.      Antoine Poche, M.D.

## 2023-01-15 ENCOUNTER — Ambulatory Visit (INDEPENDENT_AMBULATORY_CARE_PROVIDER_SITE_OTHER): Payer: Medicare Other | Admitting: Family Medicine

## 2023-01-15 ENCOUNTER — Encounter: Payer: Self-pay | Admitting: Family Medicine

## 2023-01-15 VITALS — BP 96/62 | HR 72 | Ht 70.0 in | Wt 201.1 lb

## 2023-01-15 DIAGNOSIS — F324 Major depressive disorder, single episode, in partial remission: Secondary | ICD-10-CM | POA: Diagnosis not present

## 2023-01-15 DIAGNOSIS — R2681 Unsteadiness on feet: Secondary | ICD-10-CM | POA: Diagnosis not present

## 2023-01-15 DIAGNOSIS — E66811 Obesity, class 1: Secondary | ICD-10-CM | POA: Diagnosis not present

## 2023-01-15 DIAGNOSIS — R7302 Impaired glucose tolerance (oral): Secondary | ICD-10-CM | POA: Diagnosis not present

## 2023-01-15 DIAGNOSIS — R5383 Other fatigue: Secondary | ICD-10-CM | POA: Diagnosis not present

## 2023-01-15 DIAGNOSIS — G20C Parkinsonism, unspecified: Secondary | ICD-10-CM | POA: Diagnosis not present

## 2023-01-15 DIAGNOSIS — E785 Hyperlipidemia, unspecified: Secondary | ICD-10-CM | POA: Diagnosis not present

## 2023-01-15 DIAGNOSIS — F411 Generalized anxiety disorder: Secondary | ICD-10-CM

## 2023-01-15 DIAGNOSIS — H409 Unspecified glaucoma: Secondary | ICD-10-CM | POA: Diagnosis not present

## 2023-01-15 DIAGNOSIS — R351 Nocturia: Secondary | ICD-10-CM | POA: Diagnosis not present

## 2023-01-15 NOTE — Assessment & Plan Note (Signed)
Improved  Patient re-educated about  the importance of commitment to a  minimum of 150 minutes of exercise per week as able.  The importance of healthy food choices with portion control discussed, as well as eating regularly and within a 12 hour window most days. The need to choose "clean , green" food 50 to 75% of the time is discussed, as well as to make water the primary drink and set a goal of 64 ounces water daily.       01/15/2023    1:08 PM 01/09/2023   10:55 AM 12/18/2022    8:55 AM  Weight /BMI  Weight 201 lb 1.9 oz 205 lb 205 lb 12.8 oz  Height 5\' 10"  (1.778 m) 5\' 10"  (1.778 m) 5\' 10"  (1.778 m)  BMI 28.86 kg/m2 29.41 kg/m2 29.53 kg/m2

## 2023-01-15 NOTE — Assessment & Plan Note (Signed)
QUIT x 15 years approx

## 2023-01-15 NOTE — Assessment & Plan Note (Signed)
Controlled, no change in medication Hyperlipidemia:Low fat diet discussed and encouraged.   Lipid Panel  Lab Results  Component Value Date   CHOL 154 01/07/2023   HDL 55 01/07/2023   LDLCALC 86 01/07/2023   TRIG 66 01/07/2023   CHOLHDL 2.8 01/07/2023     Updated lab needed at/ before next visit.

## 2023-01-15 NOTE — Assessment & Plan Note (Signed)
Recommend Psych eval and mx, continue therapy through the [place]

## 2023-01-15 NOTE — Assessment & Plan Note (Signed)
Uses a cane and notes instability, no falls, hasd benefited in the past from pT will send back for additonal sessions

## 2023-01-15 NOTE — Assessment & Plan Note (Addendum)
Recommned med adjustment and Psych treatment will reach out for community Psych, not seeing one at the [facility], staffing challenge, not suicidal or homicidal

## 2023-01-15 NOTE — Assessment & Plan Note (Signed)
Chronic ,is doing a bit more in his shed

## 2023-01-15 NOTE — Assessment & Plan Note (Addendum)
Folllowed closely by Opthalmology

## 2023-01-15 NOTE — Assessment & Plan Note (Signed)
Controlled with flomax, once op per night

## 2023-01-15 NOTE — Assessment & Plan Note (Signed)
Needs DaTscan per Neurology, he has d/c Risperdal, will msg Neurologist to update on situation, should have f/u scheduled early 2025, getting test done will be very beneficial to pt

## 2023-01-15 NOTE — Patient Instructions (Addendum)
F/U end  February, call if you need me sooner  I recommend PT twice weekly for 6 weeks, start when best option for you and the dept, continue to use cane for stability and no falls  I wil message Neurologist re current situation and also enter referral for your annual f/u  CBC, fasting lipid, cmp and EGFR,  3 to5 days before Feb appointment  I recommend Psychiatry to treat depression , will refer and again appointment to be made when best suited  It is important that you exercise regularly at least 30 minutes 5 times a week. If you develop chest pain, have severe difficulty breathing, or feel very tired, stop exercising immediately and seek medical attention   Thanks for choosing [facility], we consider it a privelige to serve you.

## 2023-01-15 NOTE — Progress Notes (Signed)
[name]     MRN: [id number]      DOB: 1935/09/25  Chief Complaint  Patient presents with   Follow-up    Follow up sweating a lot     HPI [name] is here for follow up and re-evaluation of chronic medical conditions, medication management and review of any available recent lab and radiology data.  Preventive health is updated, specifically  Cancer screening and Immunization.   Questions or concerns regarding consultations or procedures which the PT has had in the interim are  addressed. The PT denies any adverse reactions to current medications since the last visit.  C/o unsteady gait, uses a cane , at times feels as though right leg  more than left may give out, no recent falls Neurology evaluation for Parkinsonism is still ongoing, no longer on riperdal but needs imaging study Remains depressed and low energy, in therapy but not seeing psychiatry  ROS Denies recent fever or chills. Denies sinus pressure, nasal congestion, ear pain or sore throat. Denies chest congestion, productive cough or wheezing. Denies chest pains, palpitations and leg swelling Denies abdominal pain, nausea, vomiting,diarrhea or constipation.   Denies dysuria, frequency, hesitancy or incontinence. . Denies skin break down or rash.   PE  BP 96/62 (BP Location: Right Arm, Patient Position: Sitting, Cuff Size: Large)   Pulse 72   Ht 5\' 10"  (1.778 m)   Wt 201 lb 1.9 oz (91.2 kg)   SpO2 93%   BMI 28.86 kg/m   Patient alert and oriented and in no cardiopulmonary distress.  HEENT: No facial asymmetry, EOMI,     Neck decreased ROM.  Chest: Clear to auscultation bilaterally.  CVS: S1, S2 no murmurs, no S3.Regular rate.  ABD: Soft non tender.   Ext: No edema  MS: Adequate though reduced  ROM spine, shoulders, hips and knees.  Skin: Intact, no ulcerations or rash noted.  Psych: Good eye contact, blunted  affect. Memory intact not anxious or depressed appearing.  CNS: CN 2-12 intact,  power,  normal throughout.no focal deficits noted.   Assessment & Plan  Depression, major, single episode, in partial remission (HCC) Recommned med adjustment and Psych treatment will reach out for community Psych, not seeing one at the [facility], staffing challenge, not suicidal or homicidal  GAD (generalized anxiety disorder) Recommend Psych eval and mx, continue therapy through the [place]  Fatigue Chronic ,is doing a bit more in his shed  Hyperlipidemia LDL goal <100 Controlled, no change in medication Hyperlipidemia:Low fat diet discussed and encouraged.   Lipid Panel  Lab Results  Component Value Date   CHOL 154 01/07/2023   HDL 55 01/07/2023   LDLCALC 86 01/07/2023   TRIG 66 01/07/2023   CHOLHDL 2.8 01/07/2023     Updated lab needed at/ before next visit.   Nocturia Controlled with flomax, once op per night   SMOKELESS TOBACCO ABUSE QUIT x 15 years approx  Obesity (BMI 30.0-34.9) Improved  Patient re-educated about  the importance of commitment to a  minimum of 150 minutes of exercise per week as able.  The importance of healthy food choices with portion control discussed, as well as eating regularly and within a 12 hour window most days. The need to choose "clean , green" food 50 to 75% of the time is discussed, as well as to make water the primary drink and set a goal of 64 ounces water daily.       01/15/2023    1:08 PM 01/09/2023  10:55 AM 12/18/2022    8:55 AM  Weight /BMI  Weight 201 lb 1.9 oz 205 lb 205 lb 12.8 oz  Height 5\' 10"  (1.778 m) 5\' 10"  (1.778 m) 5\' 10"  (1.778 m)  BMI 28.86 kg/m2 29.41 kg/m2 29.53 kg/m2      Glaucoma Folllowed closely by Opthalmology  Unsteady gait Uses a cane and notes instability, no falls, hasd benefited in the past from pT will send back for additonal sessions  Primary parkinsonism [facility]) Needs DaTscan per Neurology, he has d/c Risperdal, will msg Neurologist to update on situation, should have f/u scheduled early 2025,  getting test done will be very beneficial to pt

## 2023-01-18 NOTE — Progress Notes (Signed)
Remote pacemaker transmission.   

## 2023-02-11 ENCOUNTER — Ambulatory Visit (HOSPITAL_COMMUNITY): Payer: Medicare Other

## 2023-02-12 ENCOUNTER — Other Ambulatory Visit: Payer: Self-pay | Admitting: Internal Medicine

## 2023-02-12 DIAGNOSIS — J4530 Mild persistent asthma, uncomplicated: Secondary | ICD-10-CM

## 2023-02-14 ENCOUNTER — Ambulatory Visit (HOSPITAL_COMMUNITY)
Admission: RE | Admit: 2023-02-14 | Discharge: 2023-02-14 | Disposition: A | Discharge status: Home / Self Care | Payer: Medicare Other | Source: Ambulatory Visit | Attending: Cardiology | Admitting: Cardiology

## 2023-02-14 DIAGNOSIS — I351 Nonrheumatic aortic (valve) insufficiency: Secondary | ICD-10-CM | POA: Insufficient documentation

## 2023-02-14 LAB — ECHOCARDIOGRAM COMPLETE
AR max vel: 1.91 cm2
AV Area VTI: 2.04 cm2
AV Area mean vel: 1.97 cm2
AV Mean grad: 5 mm[Hg]
AV Peak grad: 9.5 mm[Hg]
Ao pk vel: 1.54 m/s
Area-P 1/2: 4.68 cm2
P 1/2 time: 634 ms
S' Lateral: 3.1 cm

## 2023-02-14 NOTE — Progress Notes (Signed)
*  PRELIMINARY RESULTS* Echocardiogram 2D Echocardiogram has been performed.  Stacey Drain 02/14/2023, 9:25 AM

## 2023-04-04 ENCOUNTER — Ambulatory Visit (INDEPENDENT_AMBULATORY_CARE_PROVIDER_SITE_OTHER): Payer: Medicare Other

## 2023-04-04 DIAGNOSIS — I495 Sick sinus syndrome: Secondary | ICD-10-CM

## 2023-04-04 LAB — CUP PACEART REMOTE DEVICE CHECK
Battery Remaining Percentage: 60 %
Brady Statistic RA Percent Paced: 96 %
Brady Statistic RV Percent Paced: 25 %
Date Time Interrogation Session: 20241226072004
Implantable Lead Connection Status: 753985
Implantable Lead Connection Status: 753985
Implantable Lead Implant Date: 20191216
Implantable Lead Implant Date: 20191216
Implantable Lead Location: 753859
Implantable Lead Location: 753860
Implantable Lead Model: 377
Implantable Lead Model: 377
Implantable Lead Serial Number: 80840322
Implantable Lead Serial Number: 80924555
Implantable Pulse Generator Implant Date: 20191216
Lead Channel Impedance Value: 488 Ohm
Lead Channel Impedance Value: 546 Ohm
Lead Channel Pacing Threshold Amplitude: 0.9 V
Lead Channel Pacing Threshold Pulse Width: 0.4 ms
Lead Channel Sensing Intrinsic Amplitude: 0.6 mV
Lead Channel Sensing Intrinsic Amplitude: 5.7 mV
Pulse Gen Model: 407145
Pulse Gen Serial Number: 69486427

## 2023-04-22 ENCOUNTER — Other Ambulatory Visit: Payer: Self-pay | Admitting: Family Medicine

## 2023-04-24 ENCOUNTER — Ambulatory Visit (INDEPENDENT_AMBULATORY_CARE_PROVIDER_SITE_OTHER): Payer: Medicare Other

## 2023-04-24 ENCOUNTER — Other Ambulatory Visit: Payer: Self-pay | Admitting: Internal Medicine

## 2023-04-24 DIAGNOSIS — Z23 Encounter for immunization: Secondary | ICD-10-CM

## 2023-04-25 ENCOUNTER — Other Ambulatory Visit: Payer: Self-pay | Admitting: Family Medicine

## 2023-05-05 NOTE — Progress Notes (Unsigned)
Terry Love, male    DOB: 01-05-1936  MRN: [id number]   Brief patient profile:   79 yobm  never smoker/retired in cig factory referred to pulmonary clinic in [place]  05/09/2021 by Dr Terry Love for asthma f/u - onset was  2012   W/u as of 05/09/2021 [facility] [place]   PFTs 09/05/16 FVC 2.90 (84%), FEV1 2.32 (92%), F/F 80, TLC 82%, DLCO 79% Minimal reduction in diffusion capacity.  FENO 09/05/16- 54  Labs: CBC with diff 09/05/16- WBC 8.2, 5.4% eos, absolute eso count 460 Blood allergy screen  09/05/16- negative RAST, IgE 150  Cardiac: Echo 03/05/2018 [facility], EF 60 to 65%, grade 1 diastolic dysfunction      History of Present Illness  05/09/2021  Pulmonary/ 1st Love eval/ Terry Love / Terry Love re asthma maint on symbicort 160 with poor techique  Chief Complaint  Patient presents with   New Patient (Initial Visit)    Has seen Dr. Isaiah Love in the past is switching to [place] Love for location.   Concerns about breathing   Using symbicort 2 puffs in am and 2 puffs in pm. Wants to discuss different inhaler costs.   Dyspnea:  walking about 50 ft and stop x sev years  Cough: not a problem  Sleep: bed is flat/ on side s am flare but sometimes wheeze per wife  SABA use:  several times a week but never prechallenges  Rec Continue omeprazole 40 mg Take 30-60 min before first meal of the day  Add pepcid 20 mg after supper  Work on inhaler technique:   Ok to try albuterol 15 min before an activity (on alternating days)  that you know would usually make you short of breath GERD diet reviewed, bed blocks rec  Please schedule a follow up Love visit in 6 weeks, call sooner if needed with all medications /inhalers/ solutions in hand     12/18/2021  f/u ov/[place] Love/Terry Love re: asthma maint on symbicort 160  2bid with suboptimal hfa Chief Complaint  Patient presents with   Follow-up    Feels breathing is getting better    Dyspnea:  easier than it was to do adls but not very  active Cough: none  Sleeping: on back/ props up on  pillows and sleepis fine SABA use: rarely now  02: none  Rec Plan A = Automatic = Always=    Symbicort  160 Take 2 puffs first thing in am and then another 2 puffs about 12 hours later.   Work on inhaler technique:     - remember how golfers take practice swings - use your empty symbicort for training before each use  Plan B = Backup (to supplement plan A, not to replace it) Only use your albuterol inhaler as a rescue medication Please schedule a follow up visit in 3 months but call sooner if needed- bring your inhalers     03/16/2022  f/u ov/[place] Love/Terry Love re: asthma maint on symbicort 160  with moderate hfa effectiveness  Chief Complaint  Patient presents with   Follow-up    Better since last ov  Needs to discuss inhalers. Letter from insurance stating inhaler wont be covered in 2024 and need to change to generic breo or advair   Dyspnea:  across the parking lot slowly  - does better pushing grocery cart  Cough: none  Sleeping: props up with bed blocks  SABA use:  one -  two times  02: none  Rec Generic  Symbicort 160 Take  2 puffs first thing in am and then another 2 puffs about 12 hours later.  Only use your albuterol as a rescue medication  Also  Ok to try albuterol 15 min before an activity (on alternating days)  that you know would usually make you short of breath and see if it makes any difference and if makes none then don't take albuterol after activity unless you can't catch your breath as this means it's the resting that helps, not the albuterol.       09/13/2022 6 m   f/u ov/[place] Love/Terry Love re: asthma maint on symbicort 160 2 puffs   Chief Complaint  Patient presents with   Follow-up    He has noticed increased DOE since the last visit. He was winded walking from lobby to exam room today.   Dyspnea:  walk car to house in heat or shower  Cough: none  Sleeping: bed blocks  SABA use: twice a day never at  night  02: none  Rec Ok to try albuterol 15 min before an activity (on alternating days)  that you know would usually make you short of breath  Work on inhaler technique:  Please schedule a follow up Love visit in 6 weeks, call sooner if needed with all medications /inhalers/ solutions in hand  Allergy screen 09/13/22 > Eos 0. 2/ IgE 122    10/31/2022  f/u ov/[place] Love/Terry Love re: asthma  maint on symbicort 160 2bid   Chief Complaint  Patient presents with   DOE   Chronic Asthma  Dyspnea:  long walk thru [place] one prior to OV  slower pace than others  Cough: none  Sleeping: bed  blocks >> no resp cc  SABA use: once or twice daily , never pre or rechallenges  02: none  Rec No change in medications Please schedule a follow up visit in 6 months but call sooner if needed > bring inhalers with you  05/06/2023  f/u ov/[place] Love/Terry Love re: asthma maint on  did  bring inhalers  Chief Complaint  Patient presents with   Follow-up    Follow up    Dyspnea:  feels saba helps do  Cough: none  Sleeping: bed blocks no   resp cc  SABA use: still variably over using saba with activity   02: none     No obvious day to day or daytime variability or assoc excess/ purulent sputum or mucus plugs or hemoptysis or cp or chest tightness, subjective wheeze or overt sinus or hb symptoms.    Also denies any obvious fluctuation of symptoms with weather or environmental changes or other aggravating or alleviating factors except as outlined above   No unusual exposure hx or h/o childhood pna/ asthma or knowledge of premature birth.  Current Allergies, Complete Past Medical History, Past Surgical History, Family History, and Social History were reviewed in [employer] record.  ROS  The following are not active complaints unless bolded Hoarseness, sore throat, dysphagia, dental problems, itching, sneezing,  nasal congestion or discharge of excess mucus or purulent secretions,  ear ache,   fever, chills, sweats, unintended wt loss or wt gain, classically pleuritic or exertional cp,  orthopnea pnd or arm/hand swelling  or leg swelling, presyncope, palpitations, abdominal pain, anorexia, nausea, vomiting, diarrhea  or change in bowel habits or change in bladder habits, change in stools or change in urine, dysuria, hematuria,  rash, arthralgias, visual complaints, headache, numbness, weakness or ataxia or problems with walking or coordination,  change in mood or  memory.        Current Meds  Medication Sig   albuterol (VENTOLIN HFA) 108 (90 Base) MCG/ACT inhaler INHALE 2 PUFFS INTO THE LUNGS EVERY 4 HOURS AS NEEDED FOR WHEEZING OR SHORTNESS OF BREATH.   aspirin 81 MG EC tablet Take 81 mg by mouth at bedtime.   atorvastatin (LIPITOR) 40 MG tablet TAKE 1 TABLET AT BEDTIME   budesonide-formoterol (SYMBICORT) 160-4.5 MCG/ACT inhaler Take 2 puffs first thing in am and then another 2 puffs about 12 hours later.   calcium-vitamin D (OSCAL WITH D) 500-200 MG-UNIT tablet Take 1 tablet by mouth 2 (two) times daily.   dorzolamide-timolol (COSOPT) 22.3-6.8 MG/ML ophthalmic solution INSTILL 1 DROP IN [facility] EYE TWO TIMES A DAY   famotidine (PEPCID) 20 MG tablet TAKE 1 TABLET BY MOUTH EVERY DAY AFTER DINNER   furosemide (LASIX) 20 MG tablet TAKE 1 TABLET BY MOUTH DAILY. MAY TAKE AN ADDITIONAL TABLET FOR FLUID/SWELLING.   latanoprost (XALATAN) 0.005 % ophthalmic solution Place 1 drop into both eyes at bedtime.   montelukast (SINGULAIR) 10 MG tablet TAKE 1 TABLET BY MOUTH EVERYDAY AT BEDTIME   Multiple Vitamin (MULTIVITAMIN WITH MINERALS) TABS tablet Take 1 tablet by mouth at bedtime.   omeprazole (PRILOSEC) 40 MG capsule TAKE 1 CAPSULE (40 MG TOTAL) BY MOUTH DAILY.   Spacer/Aero-Holding Chambers DEVI 2 puffs by Does not apply route in the morning and at bedtime.   tamsulosin (FLOMAX) 0.4 MG CAPS capsule TAKE 1 CAPSULE BY MOUTH EVERY DAY   venlafaxine XR (EFFEXOR-XR) 150 MG 24 hr capsule  Take 1 capsule by mouth every morning.   vitamin C (ASCORBIC ACID) 500 MG tablet Take 500 mg by mouth at bedtime.    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   Obesity    Seasonal allergies         Objective:    Wts  05/06/2023        206  10/31/2022        207  09/13/2022         212  03/16/2022       215  12/18/2021       215   06/21/21 217 lb 6.4 oz (98.6 kg)  06/16/21 218 lb (98.9 kg)  06/14/21 219 lb (99.3 kg)     Vital signs reviewed  05/06/2023  - Note at rest 02 sats  94% on RA   General appearance:    eldelry pleasant bm nad    HEENT : Oropharynx  clear/ full dentures   Nasal turbinates nl   NECK :  without  apparent JVD/ palpable Nodes/TM    LUNGS: no acc muscle use, Mildly kyhotic contour chest which is clear to A and P bilaterally without cough on insp or exp maneuvers   CV:  RRR  no s3 or murmur or increase in P2, and no edema   ABD:  soft  and nontender   MS:  Gait: walks with cane  ext warm without deformities Or obvious joint restrictions  calf tenderness, cyanosis or clubbing    SKIN: warm and dry without lesions    NEURO:  alert, approp, nl sensorium with  no motor or cerebellar deficits apparent. St tremor          Assessment

## 2023-05-06 ENCOUNTER — Ambulatory Visit (INDEPENDENT_AMBULATORY_CARE_PROVIDER_SITE_OTHER): Payer: Medicare Other | Admitting: Internal Medicine

## 2023-05-06 ENCOUNTER — Encounter: Payer: Self-pay | Admitting: Internal Medicine

## 2023-05-06 VITALS — BP 119/72 | HR 72 | Ht 70.0 in | Wt 206.0 lb

## 2023-05-06 DIAGNOSIS — J4530 Mild persistent asthma, uncomplicated: Secondary | ICD-10-CM

## 2023-05-06 NOTE — Assessment & Plan Note (Signed)
Onset ? Around 2012 at wt = 246  -  PFTs 09/05/16 FVC 2.90 (84%), FEV1 2.32 (92%), F/F 80, TLC 82%, DLCO 79% Minimal reduction in diffusion capacity.  - 12/18/2021    continue symbicort 160 - 03/16/2022   changed to  generic symb 160  - 06/21/2022 spacer ordered > not being used as of 09/13/2022 - 09/13/2022  continue symb 160 but work on technique "like a golfer warms up" - FENO 09/13/2022  = 18 despite very poor hfa technique  - Allergy screen 09/13/22  >  Eos 0. 2/  IgE  122 - 05/06/2023  After extensive coaching inhaler device,  effectiveness =    90% with hfa    Other than occasional over use of albuterol related to exertion, All goals of chronic asthma control met including optimal function and elimination of symptoms with minimal need for rescue therapy.  Contingencies discussed in full including contacting this office immediately if not controlling the symptoms using the rule of two's.     Advised: Re SABA :  I spent extra time with pt today reviewing appropriate use of albuterol for prn use on exertion with the following points: 1) saba is for relief of sob that does not improve by walking a slower pace or resting but rather if the pt does not improve after trying this first. 2) If the pt is convinced, as many are, that saba helps recover from activity faster then it's easy to tell if this is the case by re-challenging : ie stop, take the inhaler, then p 5 minutes try the exact same activity (intensity of workload) that just caused the symptoms and see if they are substantially diminished or not after saba 3) if there is an activity that reproducibly causes the symptoms, try the saba 15 min before the activity on alternate days   If in fact the saba really does help, then fine to continue to use it prn but advised may need to look closer at the maintenance regimen (symbicort 160) being used to achieve better control of airways disease with exertion.    F/u can be q 6 m, sooner prn            Each maintenance medication was reviewed in detail including emphasizing most importantly the difference between maintenance and prns and under what circumstances the prns are to be triggered using an action plan format where appropriate.  Total time for H and P, chart review, counseling, reviewing hfa device(s) and generating customized AVS unique to this office visit / same day charting > 20 min

## 2023-05-06 NOTE — Patient Instructions (Signed)
Also  Ok to try albuterol 15 min before an activity (on alternating days)  that you know would usually make you short of breath and see if it makes any difference and if makes none then don't take albuterol after activity unless you can't catch your breath as this means it's the resting that helps, not the albuterol.  No change in recommendations   Please schedule a follow up visit in 6  months but call sooner if needed

## 2023-05-10 NOTE — Progress Notes (Signed)
 Remote pacemaker transmission.

## 2023-05-19 DIAGNOSIS — Z23 Encounter for immunization: Secondary | ICD-10-CM | POA: Diagnosis not present

## 2023-05-23 DIAGNOSIS — E785 Hyperlipidemia, unspecified: Secondary | ICD-10-CM | POA: Diagnosis not present

## 2023-05-23 DIAGNOSIS — E66811 Obesity, class 1: Secondary | ICD-10-CM | POA: Diagnosis not present

## 2023-05-24 LAB — CMP14+EGFR
ALT: 18 [IU]/L (ref 0–44)
AST: 24 [IU]/L (ref 0–40)
Albumin: 4.1 g/dL (ref 3.7–4.7)
Alkaline Phosphatase: 67 [IU]/L (ref 44–121)
BUN/Creatinine Ratio: 18 (ref 10–24)
BUN: 15 mg/dL (ref 8–27)
Bilirubin Total: 0.4 mg/dL (ref 0.0–1.2)
CO2: 24 mmol/L (ref 20–29)
Calcium: 9.5 mg/dL (ref 8.6–10.2)
Chloride: 106 mmol/L (ref 96–106)
Creatinine, Ser: 0.82 mg/dL (ref 0.76–1.27)
Globulin, Total: 2.6 g/dL (ref 1.5–4.5)
Glucose: 104 mg/dL — ABNORMAL HIGH (ref 70–99)
Potassium: 4.9 mmol/L (ref 3.5–5.2)
Sodium: 144 mmol/L (ref 134–144)
Total Protein: 6.7 g/dL (ref 6.0–8.5)
eGFR: 86 mL/min/{1.73_m2} (ref >59)

## 2023-05-24 LAB — CBC
Hematocrit: 40.5 % (ref 37.5–51.0)
Hemoglobin: 12.9 g/dL — ABNORMAL LOW (ref 13.0–17.7)
MCH: 29.9 pg (ref 26.6–33.0)
MCHC: 31.9 g/dL (ref 31.5–35.7)
MCV: 94 fL (ref 79–97)
Platelets: 304 10*3/uL (ref 150–450)
RBC: 4.31 x10E6/uL (ref 4.14–5.80)
RDW: 11.3 % — ABNORMAL LOW (ref 11.6–15.4)
WBC: 5.4 10*3/uL (ref 3.4–10.8)

## 2023-05-24 LAB — LIPID PANEL
Chol/HDL Ratio: 2.8 {ratio} (ref 0.0–5.0)
Cholesterol, Total: 154 mg/dL (ref 100–199)
HDL: 55 mg/dL (ref >39)
LDL Chol Calc (NIH): 88 mg/dL (ref 0–99)
Triglycerides: 54 mg/dL (ref 0–149)
VLDL Cholesterol Cal: 11 mg/dL (ref 5–40)

## 2023-05-30 ENCOUNTER — Ambulatory Visit: Payer: Self-pay | Admitting: Family Medicine

## 2023-05-30 ENCOUNTER — Ambulatory Visit: Payer: Medicare Other | Admitting: Family Medicine

## 2023-06-19 ENCOUNTER — Ambulatory Visit: Payer: Self-pay | Admitting: Family Medicine

## 2023-06-19 NOTE — Telephone Encounter (Signed)
 Summary: tummy problems, cramping and pain, pain has gotten worse over the last 2 days.   Copied From CRM [id number]. Reason for Triage: Dorene Sorrow patients wife calling in, Patient is having tummy problems, cramping and pain, pain has gotten worse over the last 2 days. Patient does not complain until pain is really bad.     Chief Complaint: abd pain Symptoms: abd pain moderate near belly button area, nausea Frequency: x 1week Pertinent Negatives: Patient denies fever Disposition: [] ED /[] Urgent Care (no appt availability in office) / [x] Appointment(In office/virtual)/ []  [place] [facility]/ [] Home Care/ [] Refused Recommended Disposition /[] [place] [facility]/ []  Follow-up with PCP Additional Notes: wife stated pt has been in pain and pain is getting worse: would like to be seen in office. No appt available with PCP until 5/25 and pt/wife does not want to be seen by anyone else: pt would like to be worked into schedule. - please call pt  Reason for Disposition  [1] MODERATE pain (e.g., interferes with normal activities) AND [2] pain comes and goes (cramps) AND [3] present > 24 hours  (Exception: Pain with Vomiting or Diarrhea - see that Guideline.)  Answer Assessment - Initial Assessment Questions 1. LOCATION: "Where does it hurt?"      Abd near belly button 2. RADIATION: "Does the pain shoot anywhere else?" (e.g., chest, back)     unknown 3. ONSET: "When did the pain begin?" (Minutes, hours or days ago)      X 1 week 4. SUDDEN: "Gradual or sudden onset?"     gradual 5. PATTERN "Does the pain come and go, or is it constant?"    - If it comes and goes: "How long does it last?" "Do you have pain now?"     (Note: Comes and goes means the pain is intermittent. It goes away completely between bouts.)    - If constant: "Is it getting better, staying the same, or getting worse?"      (Note: Constant means the pain never goes away completely; most serious pain is constant and gets worse.)       constant 6. SEVERITY: "How bad is the pain?"  (e.g., Scale 1-10; mild, moderate, or severe)    - MILD (1-3): Doesn't interfere with normal activities, abdomen soft and not tender to touch.     - MODERATE (4-7): Interferes with normal activities or awakens from sleep, abdomen tender to touch.     - SEVERE (8-10): Excruciating pain, doubled over, unable to do any normal activities.       moderate 7. RECURRENT SYMPTOM: "Have you ever had this type of stomach pain before?" If Yes, ask: "When was the last time?" and "What happened that time?"      nausea 8. CAUSE: "What do you think is causing the stomach pain?"     unknown 9. RELIEVING/AGGRAVATING FACTORS: "What makes it better or worse?" (e.g., antacids, bending or twisting motion, bowel movement)     no 10. OTHER SYMPTOMS: "Do you have any other symptoms?" (e.g., back pain, diarrhea, fever, urination pain, vomiting)       no  Protocols used: Abdominal Pain - Male-A-AH

## 2023-06-19 NOTE — Telephone Encounter (Signed)
 Abdominal pain near belly button, worsening over the past 2 days. Wife concerned because he doesn't complain until its really bad. Doesn't want to see any other provider. Requesting to be worked in. Pls advise

## 2023-06-20 NOTE — Telephone Encounter (Signed)
 Appt on 3/14, nurse spoke with spouse. Positive for worms, and feels that Is causing the pain

## 2023-06-21 ENCOUNTER — Ambulatory Visit: Admitting: Family Medicine

## 2023-06-21 ENCOUNTER — Encounter: Payer: Self-pay | Admitting: Family Medicine

## 2023-06-21 VITALS — BP 105/64 | HR 69 | Ht 70.0 in | Wt 205.0 lb

## 2023-06-21 DIAGNOSIS — B839 Helminthiasis, unspecified: Secondary | ICD-10-CM | POA: Insufficient documentation

## 2023-06-21 DIAGNOSIS — R5383 Other fatigue: Secondary | ICD-10-CM | POA: Diagnosis not present

## 2023-06-21 DIAGNOSIS — H4020X0 Unspecified primary angle-closure glaucoma, stage unspecified: Secondary | ICD-10-CM | POA: Insufficient documentation

## 2023-06-21 DIAGNOSIS — I495 Sick sinus syndrome: Secondary | ICD-10-CM

## 2023-06-21 DIAGNOSIS — Z9842 Cataract extraction status, left eye: Secondary | ICD-10-CM | POA: Insufficient documentation

## 2023-06-21 DIAGNOSIS — D649 Anemia, unspecified: Secondary | ICD-10-CM

## 2023-06-21 MED ORDER — VITAMIN D (ERGOCALCIFEROL) 1.25 MG (50000 UNIT) PO CAPS
50000.0000 [IU] | ORAL_CAPSULE | ORAL | 1 refills | Status: DC
Start: 2023-06-24 — End: 2023-12-25

## 2023-06-21 MED ORDER — OMEPRAZOLE 40 MG PO CPDR: 40.0000 mg | DELAYED_RELEASE_CAPSULE | Freq: Every day | ORAL | 1 refills | Status: DC

## 2023-06-21 MED ORDER — ALBENDAZOLE 200 MG PO TABS: 400.0000 mg | ORAL_TABLET | Freq: Two times a day (BID) | ORAL | 0 refills | Status: DC

## 2023-06-21 MED ORDER — MONTELUKAST SODIUM 10 MG PO TABS: 10.0000 mg | ORAL_TABLET | Freq: Every day | ORAL | 1 refills | Status: DC

## 2023-06-21 NOTE — Assessment & Plan Note (Addendum)
 Abdominal discomfort, anemia and positive stool test refer gI, abendazole prescribed

## 2023-06-21 NOTE — Patient Instructions (Addendum)
 F/U mid to end May, call if you need me sooner  You will be treated for current infection, abendazole is prescribed, I am also asking your GI Doctor for assistance with the treatment so a referral is sent  You are referred to Cardiology to re evaluate your heart  Labs today, cBC and diff B12 , iron, ferritin and folic acid level  Thanks for choosing [facility], we consider it a privelige to serve you.

## 2023-06-21 NOTE — Assessment & Plan Note (Addendum)
 Refer gI for eval, reports chronic and worsening fatigue, recent stool test positive for worm infestation

## 2023-06-22 LAB — CBC WITH DIFFERENTIAL/PLATELET
Basophils Absolute: 0.1 10*3/uL (ref 0.0–0.2)
Basos: 1 %
EOS (ABSOLUTE): 0.2 10*3/uL (ref 0.0–0.4)
Eos: 5 %
Hematocrit: 38.4 % (ref 37.5–51.0)
Hemoglobin: 12.3 g/dL — ABNORMAL LOW (ref 13.0–17.7)
Immature Grans (Abs): 0 10*3/uL (ref 0.0–0.1)
Immature Granulocytes: 0 %
Lymphocytes Absolute: 1.4 10*3/uL (ref 0.7–3.1)
Lymphs: 27 %
MCH: 30.1 pg (ref 26.6–33.0)
MCHC: 32 g/dL (ref 31.5–35.7)
MCV: 94 fL (ref 79–97)
Monocytes Absolute: 0.5 10*3/uL (ref 0.1–0.9)
Monocytes: 10 %
Neutrophils Absolute: 3.1 10*3/uL (ref 1.4–7.0)
Neutrophils: 57 %
Platelets: 284 10*3/uL (ref 150–450)
RBC: 4.08 x10E6/uL — ABNORMAL LOW (ref 4.14–5.80)
RDW: 11.2 % — ABNORMAL LOW (ref 11.6–15.4)
WBC: 5.3 10*3/uL (ref 3.4–10.8)

## 2023-06-22 LAB — IRON,TIBC AND FERRITIN PANEL
Ferritin: 208 ng/mL (ref 30–400)
Iron Saturation: 33 % (ref 15–55)
Iron: 80 ug/dL (ref 38–169)
Total Iron Binding Capacity: 245 ug/dL — ABNORMAL LOW (ref 250–450)
UIBC: 165 ug/dL (ref 111–343)

## 2023-06-22 LAB — B12 AND FOLATE PANEL
Folate: 19.3 ng/mL (ref >3.0)
Vitamin B-12: 904 pg/mL (ref 232–1245)

## 2023-06-23 ENCOUNTER — Encounter: Payer: Self-pay | Admitting: Family Medicine

## 2023-06-24 ENCOUNTER — Ambulatory Visit: Payer: Self-pay | Admitting: Family Medicine

## 2023-06-24 NOTE — Telephone Encounter (Signed)
 Copied from CRM [id number]. Topic: Clinical - Lab/Test Results >> Jun 24, 2023  3:52 PM Antwanette L wrote: Reason for CRM: Patient wife Maxcine Ham) has questions about lab results   Chief Complaint: PCP Call-No Triage    Disposition: [] ED /[] Urgent Care (no appt availability in office) / [] Appointment(In office/virtual)/ []  [place] [facility]/ [] Home Care/ [] Refused Recommended Disposition /[] [place] [facility]/ [x]  Follow-up with PCP  Additional Notes: Spoke with the wife for triage, called in regarding lab results and concerns of abnormals. Addressed and answered the patient's concerns. Verbalized understanding, no other requests at this time.

## 2023-06-26 NOTE — Telephone Encounter (Signed)
 Pt aware of lab results per agent

## 2023-07-02 ENCOUNTER — Telehealth: Payer: Self-pay | Admitting: Family Medicine

## 2023-07-02 ENCOUNTER — Ambulatory Visit: Admitting: Family Medicine

## 2023-07-02 NOTE — Telephone Encounter (Unsigned)
 Copied from CRM [id number]. Topic: Clinical - Prescription Issue >> Jul 01, 2023 11:35 AM Ivette P wrote: Reason for CRM: pt wife called in about medication albendazole (ALBENZA) 200 MG tablet. She would like to know if he should be getting a refill or how she should proceed. Pt has 1 pill left. Would like call back [phone]

## 2023-07-03 ENCOUNTER — Ambulatory Visit: Payer: Medicare Other

## 2023-07-03 ENCOUNTER — Other Ambulatory Visit: Payer: Self-pay | Admitting: Family Medicine

## 2023-07-03 DIAGNOSIS — B839 Helminthiasis, unspecified: Secondary | ICD-10-CM

## 2023-07-03 DIAGNOSIS — I495 Sick sinus syndrome: Secondary | ICD-10-CM

## 2023-07-03 LAB — CUP PACEART REMOTE DEVICE CHECK
Battery Voltage: 60
Date Time Interrogation Session: 20250326075009
Implantable Lead Connection Status: 753985
Implantable Lead Connection Status: 753985
Implantable Lead Implant Date: 20191216
Implantable Lead Implant Date: 20191216
Implantable Lead Location: 753859
Implantable Lead Location: 753860
Implantable Lead Model: 377
Implantable Lead Model: 377
Implantable Lead Serial Number: 80840322
Implantable Lead Serial Number: 80924555
Implantable Pulse Generator Implant Date: 20191216
Pulse Gen Model: 407145
Pulse Gen Serial Number: 69486427

## 2023-07-03 NOTE — Progress Notes (Signed)
   Terry Love     MRN: [id number]      DOB: [birth year]  Chief Complaint  Patient presents with   Acute Visit    Stomach Pain pt. Reports feeling hungry all the time but he has a constant feeling of having an upset stomach that makes him nauseous for over a month also reports feelings of dizziness, headaches, and fatigue w/ weakness. Pt. Is concerned that he has a parasite.    HPI Terry Love is here with above concerns Has paperwork from local vet reportig positive worm infestation on 06/17/2023, dipylidium , eggs and larva embedded in GI mucosa ROS See HPI  Denies recent fever or chills. C/o chronic and worsening fatigue . C/o intermittent chest discomfort and fatigue Denies dysuria, frequency, hesitancy or incontinence. Denies joint pain, swelling and limitation in mobility. Denies headaches, seizures, numbness, or tingling. Chronic depression Denies skin break down or rash.   PE  BP 105/64   Pulse 69   Ht 5\' 10"  (1.778 m)   Wt 205 lb 0.6 oz (93 kg)   SpO2 100%   BMI 29.42 kg/m   Patient alert and oriented and in no cardiopulmonary distress.  HEENT: No facial asymmetry, EOMI,     Neck supple .  Chest: Clear to auscultation bilaterally.  CVS: S1, S2 no murmurs, no S3.Regular rate.  ABD: Soft non tender.   Ext: No edema  MS: Decreased  ROM spine, shoulders, hips and knees.  Skin: Intact, no ulcerations or rash noted.  Psych: Good eye contact, normal affect. Memory impaired , mildly  anxious oand depressed appearing.  CNS: CN 2-12 intact, power,  normal throughout.no focal deficits noted.   Assessment & Plan  Worm infestation Abdominal discomfort, anemia and positive stool test refer gI, abendazole prescribed   Anemia Refer gI for eval, reports chronic and worsening fatigue, recent stool test positive for worm infestation  Sinus node dysfunction (HCC) REPRORTS ONGOING FATIGUE AND INTERMITTENT CHEST DISCOMFORT, F/U DUE IN 07/2023, NONE ON FILE WILL  REFER

## 2023-07-03 NOTE — Telephone Encounter (Signed)
 If pt calls back- let him know that Dr Lodema Hong wants him to come by the office and get a specimen container to submit a stool specimen and return it to the office. Dr Lodema Hong will be in touch with GI later today to discuss and we will contact them when the results are back.

## 2023-07-03 NOTE — Telephone Encounter (Signed)
Called patient and left message for them to return call at the office   

## 2023-07-03 NOTE — Telephone Encounter (Signed)
 Pls xLL PT LET HER KNOW I AM NOT RECOMMENDING ADDITONAL TREATMENT NOW, HE SHOULD SUBMIT STOOL FOR TESTING , ORDER TEST PLS FOPR WORMS ALSO LET HER KNOW I WILL BE REACHING OUT DIRECTLY TO GI LATER TODAY REGARDING APPROPRIATE FOLLOW UP AND MANAGEMENT, SORRY FOR THE DELAY

## 2023-07-03 NOTE — Assessment & Plan Note (Signed)
 REPRORTS ONGOING FATIGUE AND INTERMITTENT CHEST DISCOMFORT, F/U DUE IN 07/2023, NONE ON FILE WILL REFER

## 2023-07-04 ENCOUNTER — Encounter: Payer: Self-pay | Admitting: *Deleted

## 2023-07-04 ENCOUNTER — Encounter: Payer: Self-pay | Admitting: Internal Medicine

## 2023-07-04 NOTE — Telephone Encounter (Signed)
 Wife aware and will come collect the specimen supplies

## 2023-07-05 DIAGNOSIS — B839 Helminthiasis, unspecified: Secondary | ICD-10-CM | POA: Diagnosis not present

## 2023-07-08 ENCOUNTER — Encounter: Payer: Self-pay | Admitting: Family Medicine

## 2023-07-08 LAB — PARASITE ID, WORM

## 2023-07-15 ENCOUNTER — Ambulatory Visit: Payer: Medicare Other

## 2023-07-22 ENCOUNTER — Other Ambulatory Visit: Payer: Self-pay | Admitting: Internal Medicine

## 2023-07-22 MED ORDER — BUDESONIDE-FORMOTEROL FUMARATE 160-4.5 MCG/ACT IN AERO: INHALATION_SPRAY | RESPIRATORY_TRACT | 6 refills | Status: DC

## 2023-07-22 NOTE — Telephone Encounter (Signed)
 Copied from CRM [id number]. Topic: Clinical - Medication Refill >> Jul 22, 2023 10:05 AM Ilene Malling wrote: Most Recent Pulmonary Care Visit:   Provider: Dr. Vernestine Gondola Department: [facility] Date: 05/06/2023   Medication: budesonide-formoterol (SYMBICORT) 160-4.5 MCG/ACT inhaler   Has the patient contacted their pharmacy? Yes, medication was denied and advised patient needs to be seen. Patient's spouse Nechama Bales states patient was seen 05/06/23 and  Dr. Waymond Hailey and advised to f/u in 6 months, patient is not due for office visit yet.  (Agent: If no, request that the patient contact the pharmacy for the refill. If patient does not wish to contact the pharmacy document the reason why and proceed with request.) (Agent: If yes, when and what did the pharmacy advise?)  Is this the correct pharmacy for this prescription? Yes If no, delete pharmacy and type the correct one.  This is the patient's preferred pharmacy:  [id number] - [place] - [address] AT [facility] [address] [place] [address] Phone: [phone] Fax: [phone]  Has the prescription been filled recently? No  Is the patient out of the medication? Yes, patient needs refills today  Has the patient been seen for an appointment in the last year OR does the patient have an upcoming appointment? Yes  Can we respond through MyChart? No, please call patient's wife Ivinia [phone]  Agent: Please be advised that Rx refills may take up to 3 business days. We ask that you follow-up with your pharmacy.

## 2023-07-30 ENCOUNTER — Encounter: Payer: Self-pay | Admitting: Internal Medicine

## 2023-07-30 ENCOUNTER — Ambulatory Visit (INDEPENDENT_AMBULATORY_CARE_PROVIDER_SITE_OTHER): Admitting: Internal Medicine

## 2023-07-30 VITALS — BP 127/69 | HR 80 | Temp 98.3°F | Ht 70.0 in | Wt 198.4 lb

## 2023-07-30 DIAGNOSIS — Z860101 Personal history of adenomatous and serrated colon polyps: Secondary | ICD-10-CM

## 2023-07-30 DIAGNOSIS — D509 Iron deficiency anemia, unspecified: Secondary | ICD-10-CM

## 2023-07-30 DIAGNOSIS — Z8619 Personal history of other infectious and parasitic diseases: Secondary | ICD-10-CM

## 2023-07-30 DIAGNOSIS — D649 Anemia, unspecified: Secondary | ICD-10-CM | POA: Diagnosis not present

## 2023-07-30 DIAGNOSIS — Z23 Encounter for immunization: Secondary | ICD-10-CM | POA: Diagnosis not present

## 2023-07-30 NOTE — Progress Notes (Signed)
 Primary Care Physician:  Towanda Fret, MD Primary Gastroenterologist:  Dr. Riley Cheadle  Pre-Procedure History & Physical: HPI:  Terry Love is a 88 y.o. male [place]  [employer] veteran referred to Dr. Rodolph Clap after being found to have tapeworms in his stool.  He was complaining to his friends about near and about generalized malaise vague intermittent nausea but no vomiting.  That said he probably have worms.  He submitted the stool sample which is in the media section of epic which revealed infestation with Dipylidium (tapeworm).  Dr. Rodolph Clap prescribed albendazole .  Follow-up parasite examination of his stool was negative.  He has no specific GI complaints at this time.  Does have a mild anemia he is not iron deficient.  Last colonoscopy 2015  -3 tiny polyps removed 2 hyperplastic one 3 mm adenoma.  It was recommended he consider having 1 more colonoscopy in 7 years. This notably has not had any change in his bowel habits.  He denies constipation or diarrhea.  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 Rehman:Examination performed to cecum Pan colonic diverticulosis/2 small polyps ablated via cold biopsy from transverse colon/External hemorrhoids, benign polyps   COLONOSCOPY N/A 06/29/2013   Procedure: COLONOSCOPY;  Surgeon: Suzette Espy, MD;  Location: [facility];  Service: Endoscopy;  Laterality: N/A;  10:45   EYE SURGERY     left eye cataracts    PACEMAKER IMPLANT N/A  03/24/2018   Procedure: PACEMAKER IMPLANT;  Surgeon: Tammie Fall, MD;  Location: [facility];  Service: Cardiovascular;  Laterality: N/A;    Prior to Admission medications   Medication Sig Start Date End Date Taking? Authorizing Provider  albuterol  (VENTOLIN  HFA) 108 (90 Base) MCG/ACT inhaler INHALE 2 PUFFS INTO THE LUNGS EVERY 4 HOURS AS NEEDED FOR WHEEZING OR SHORTNESS OF BREATH. 02/13/23  Yes Diamond Formica, MD  aspirin  81 MG EC tablet Take 81 mg by mouth at bedtime.   Yes [provider]  atorvastatin  (LIPITOR) 40 MG tablet TAKE 1 TABLET AT BEDTIME 11/12/22  Yes Towanda Fret, MD  budesonide -formoterol  (SYMBICORT ) 160-4.5 MCG/ACT inhaler Take 2 puffs first thing in am and then another 2 puffs about 12 hours later. 07/22/23  Yes Diamond Formica, MD  calcium -vitamin D  (OSCAL WITH D) 500-200 MG-UNIT tablet Take 1 tablet by mouth 2 (two) times daily. 10/01/16  Yes Towanda Fret, MD  carboxymethylcellulose (REFRESH PLUS) 0.5 % SOLN  01/02/21  Yes [provider]  dorzolamide-timolol (COSOPT) 22.3-6.8 MG/ML ophthalmic solution INSTILL 1 DROP IN EACH EYE TWO TIMES A DAY 01/02/21  Yes [provider]  famotidine  (PEPCID ) 20 MG tablet TAKE 1 TABLET BY MOUTH EVERY DAY AFTER DINNER 04/25/23  Yes Diamond Formica, MD  furosemide  (LASIX ) 20 MG tablet TAKE 1  TABLET BY MOUTH DAILY. MAY TAKE AN ADDITIONAL TABLET FOR FLUID/SWELLING. 12/03/22  Yes Branch, Joyceann No, MD  latanoprost (XALATAN) 0.005 % ophthalmic solution Place 1 drop into both eyes at bedtime.   Yes [provider]  montelukast  (SINGULAIR ) 10 MG tablet Take 1 tablet (10 mg total) by mouth at bedtime. 06/21/23  Yes Towanda Fret, MD  Multiple Vitamin (MULTIVITAMIN WITH MINERALS) TABS tablet Take 1 tablet by mouth at bedtime.   Yes [provider]  omeprazole  (PRILOSEC) 40 MG capsule Take 1 capsule (40 mg total) by mouth daily. 06/21/23  Yes Towanda Fret, MD  Spacer/Aero-Holding  Idelle Majors DEVI 2 puffs by Does not apply route in the morning and at bedtime. 06/25/22  Yes Diamond Formica, MD  tamsulosin  (FLOMAX ) 0.4 MG CAPS capsule TAKE 1 CAPSULE BY MOUTH EVERY DAY 11/27/22  Yes McKenzie, Arden Beck, MD  venlafaxine  XR (EFFEXOR -XR) 150 MG 24 hr capsule Take 1 capsule by mouth every morning. 12/07/20  Yes [provider]  vitamin C (ASCORBIC ACID) 500 MG tablet Take 500 mg by mouth at bedtime.    Yes [provider]  Vitamin D , Ergocalciferol , (DRISDOL ) 1.25 MG (50000 UNIT) CAPS capsule Take 1 capsule (50,000 Units total) by mouth every Monday. In the morning 06/24/23  Yes Towanda Fret, MD    Allergies as of 07/30/2023   (No Known Allergies)    Family History  Problem Relation Age of Onset   Colon cancer Mother        diagnosed at age 74   Aneurysm Father    Aneurysm Brother    Stroke Brother     Social History   Socioeconomic History   Marital status: Married    Spouse name: Bennett Brass    Number of children: 0   Years of education: 12   Highest education level: 12th grade  Occupational History   Occupation: retired    Comment: [employer];  Tobacco Use   Smoking status: Never   Smokeless tobacco: Former    Types: Chew    Quit date: 03/13/2016  Vaping Use   Vaping status: Never Used  Substance and Sexual Activity   Alcohol use: No    Alcohol/week: 0.0 standard drinks of alcohol   Drug use: No   Sexual activity: Not Currently  Other Topics Concern   Not on file  Social History Narrative   Right handed    Social Drivers of Health   Financial Resource Strain: Low Risk  (03/19/2022)   Overall Financial Resource Strain (CARDIA)    Difficulty of Paying Living Expenses: Not hard at all  Food Insecurity: No Food Insecurity (03/19/2022)   Hunger Vital Sign    Worried About Running Out of Food in the Last Year: Never true    Ran Out of Food in the Last Year: Never true  Transportation Needs: No Transportation Needs (03/19/2022)    PRAPARE - Administrator, Civil Service (Medical): No    Lack of Transportation (Non-Medical): No  Physical Activity: Insufficiently Active (03/19/2022)   Exercise Vital Sign    Days of Exercise per Week: 3 days    Minutes of Exercise per Session: 20 min  Stress: No Stress Concern Present (03/19/2022)   [employer] of Occupational Health - Occupational Stress Questionnaire    Feeling of Stress : Not at all  Social Connections: Moderately Integrated (03/19/2022)   Social Connection and Isolation Panel [NHANES]    Frequency of Communication with Friends and Family:  More than three times a week    Frequency of Social Gatherings with Friends and Family: More than three times a week    Attends Religious Services: More than 4 times per year    Active Member of [employer] or Organizations: No    Attends Banker Meetings: Never    Marital Status: Married  Catering manager Violence: Not At Risk (03/19/2022)   Humiliation, Afraid, Rape, and Kick questionnaire    Fear of Current or Ex-Partner: No    Emotionally Abused: No    Physically Abused: No    Sexually Abused: No    Review of Systems: See HPI, otherwise negative ROS  Physical Exam: BP 127/69 (BP Location: Right Arm, Patient Position: Sitting, Cuff Size: Normal)   Pulse 80   Temp 98.3 F (36.8 C) (Oral)   Ht 5\' 10"  (1.778 m)   Wt 198 lb 6.4 oz (90 kg)   SpO2 95%   BMI 28.47 kg/m  General:   Alert,  Well-developed, well-nourished, pleasant and cooperative in NAD accompanied by his spouse. Neck:  Supple; no masses or thyromegaly. No significant cervical adenopathy. Lungs:  Clear throughout to auscultation.   No wheezes, crackles, or rhonchi. No acute distress. Heart:  Regular rate and rhythm; no murmurs, clicks, rubs,  or gallops. Abdomen: Non-distended, normal bowel sounds.  Soft and nontender without appreciable mass or hepatosplenomegaly.  Rectal: No external lesions.  Good sphincter tone.  No mass  in the rectal vault scant brown stool Hemoccult negative Impression/Plan: Very pleasant 88 year old gentleman [place]  [employer] veteran found to have dipylidium in his stool by his good friend the International aid/development worker.  Dr. Rodolph Clap provided a course of albendazole  and follow-up stool studies negative for parasites.  He has nonspecific anemia; he is not iron deficient.  He is Hemoccult negative.  He had only a 3 mm polyp removed in 2015 which turned out to be an adenoma at this point, I do not think he needs any further endoscopic evaluation.  He has nonspecific anemia may well clear up with eradication of tapeworm infestation.  At this time, I simply recommend reassurance and have his CBC checked again in the coming months.  Unless something changes.,  Do not recommend further GI evaluation.     Notice: This dictation was prepared with Dragon dictation along with smaller phrase technology. Any transcriptional errors that result from this process are unintentional and may not be corrected upon review.

## 2023-07-30 NOTE — Patient Instructions (Signed)
 It was good to see you again today!  I believe Dr. Rodolph Clap took care of your problem prescribing albendazole  for tape worm infection.  You do not have any blood in your stool today.  At this time, you do not need another colonoscopy or any other testing at this time  I do recommend you have a repeat CBC done in June just to see where your hemoglobin stands.  If you begin having new symptoms, please let me know.

## 2023-08-09 ENCOUNTER — Emergency Department (HOSPITAL_COMMUNITY)

## 2023-08-09 ENCOUNTER — Ambulatory Visit (INDEPENDENT_AMBULATORY_CARE_PROVIDER_SITE_OTHER): Payer: Self-pay | Admitting: Family Medicine

## 2023-08-09 ENCOUNTER — Other Ambulatory Visit: Payer: Self-pay

## 2023-08-09 ENCOUNTER — Emergency Department (HOSPITAL_COMMUNITY)
Admission: EM | Admit: 2023-08-09 | Discharge: 2023-08-09 | Disposition: A | Discharge status: Home / Self Care | Attending: Emergency Medicine | Admitting: Emergency Medicine

## 2023-08-09 ENCOUNTER — Encounter (HOSPITAL_COMMUNITY): Payer: Self-pay | Admitting: Emergency Medicine

## 2023-08-09 ENCOUNTER — Encounter: Payer: Self-pay | Admitting: Family Medicine

## 2023-08-09 VITALS — BP 86/54 | HR 68 | Resp 16 | Ht 70.0 in | Wt 202.0 lb

## 2023-08-09 DIAGNOSIS — E860 Dehydration: Secondary | ICD-10-CM | POA: Diagnosis not present

## 2023-08-09 DIAGNOSIS — F324 Major depressive disorder, single episode, in partial remission: Secondary | ICD-10-CM

## 2023-08-09 DIAGNOSIS — B839 Helminthiasis, unspecified: Secondary | ICD-10-CM | POA: Diagnosis not present

## 2023-08-09 DIAGNOSIS — I959 Hypotension, unspecified: Secondary | ICD-10-CM

## 2023-08-09 DIAGNOSIS — Z7982 Long term (current) use of aspirin: Secondary | ICD-10-CM | POA: Diagnosis not present

## 2023-08-09 DIAGNOSIS — R600 Localized edema: Secondary | ICD-10-CM | POA: Insufficient documentation

## 2023-08-09 DIAGNOSIS — R0609 Other forms of dyspnea: Secondary | ICD-10-CM | POA: Diagnosis not present

## 2023-08-09 LAB — CBC WITH DIFFERENTIAL/PLATELET
Abs Immature Granulocytes: 0.02 10*3/uL (ref 0.00–0.07)
Basophils Absolute: 0.1 10*3/uL (ref 0.0–0.1)
Basophils Relative: 1 %
Eosinophils Absolute: 0.2 10*3/uL (ref 0.0–0.5)
Eosinophils Relative: 4 %
HCT: 39.1 % (ref 39.0–52.0)
Hemoglobin: 12.3 g/dL — ABNORMAL LOW (ref 13.0–17.0)
Immature Granulocytes: 0 %
Lymphocytes Relative: 27 %
Lymphs Abs: 1.4 10*3/uL (ref 0.7–4.0)
MCH: 30.1 pg (ref 26.0–34.0)
MCHC: 31.5 g/dL (ref 30.0–36.0)
MCV: 95.6 fL (ref 80.0–100.0)
Monocytes Absolute: 0.5 10*3/uL (ref 0.1–1.0)
Monocytes Relative: 10 %
Neutro Abs: 3.1 10*3/uL (ref 1.7–7.7)
Neutrophils Relative %: 58 %
Platelets: 268 10*3/uL (ref 150–400)
RBC: 4.09 MIL/uL — ABNORMAL LOW (ref 4.22–5.81)
RDW: 11.9 % (ref 11.5–15.5)
WBC: 5.3 10*3/uL (ref 4.0–10.5)
nRBC: 0 % (ref 0.0–0.2)

## 2023-08-09 LAB — COMPREHENSIVE METABOLIC PANEL WITH GFR
ALT: 26 U/L (ref 0–44)
AST: 28 U/L (ref 15–41)
Albumin: 3.6 g/dL (ref 3.5–5.0)
Alkaline Phosphatase: 51 U/L (ref 38–126)
Anion gap: 8 (ref 5–15)
BUN: 17 mg/dL (ref 8–23)
CO2: 24 mmol/L (ref 22–32)
Calcium: 9.4 mg/dL (ref 8.9–10.3)
Chloride: 105 mmol/L (ref 98–111)
Creatinine, Ser: 0.88 mg/dL (ref 0.61–1.24)
GFR, Estimated: >60 mL/min (ref >60)
Glucose, Bld: 109 mg/dL — ABNORMAL HIGH (ref 70–99)
Potassium: 3.9 mmol/L (ref 3.5–5.1)
Sodium: 137 mmol/L (ref 135–145)
Total Bilirubin: 0.7 mg/dL (ref 0.0–1.2)
Total Protein: 7 g/dL (ref 6.5–8.1)

## 2023-08-09 LAB — LACTIC ACID, PLASMA: Lactic Acid, Venous: 1.2 mmol/L (ref 0.5–1.9)

## 2023-08-09 LAB — BRAIN NATRIURETIC PEPTIDE: B Natriuretic Peptide: 26 pg/mL (ref 0.0–100.0)

## 2023-08-09 MED ORDER — SODIUM CHLORIDE 0.9 % IV BOLUS
500.0000 mL | Freq: Once | INTRAVENOUS | Status: AC
  Administered 2023-08-09: 500 mL via INTRAVENOUS

## 2023-08-09 NOTE — ED Notes (Signed)
 Pt/family received d/c paperwork at this time. After going over the paperwork any questions, comments, or concerns were answered to the best of this nurse's knowledge. The pt/family verbally acknowledged the teachings/instructions.   Reviewed with wife of pt as well.

## 2023-08-09 NOTE — Discharge Instructions (Signed)
 You were seen for your dehydration in the emergency department.   At home, please hold your Lasix  for the next 3 days.    Check your MyChart online for the results of any tests that had not resulted by the time you left the emergency department.   Follow-up with your primary doctor in 2-3 days regarding your visit.    Return immediately to the emergency department if you experience any of the following: Weakness, dizziness, fainting, or any other concerning symptoms.    Thank you for visiting our Emergency Department. It was a pleasure taking care of you today.

## 2023-08-09 NOTE — Patient Instructions (Signed)
 F/u in 6 weeks, csall if you need mne sooner    You need to go to the eD blood pressure low and you feel weak. I will sort out worm treatment and get back to you  I am calling the ED Doc  Thanks for choosing [facility], we consider it a privelige to serve you.

## 2023-08-09 NOTE — ED Triage Notes (Signed)
 Pt was seeing his PCP today, stated they noticed his BP was 86/50  and sent him to ED for fluids. Pt denies any pain.  Having dizziness when standing and walking.     BP 104/83 in triage

## 2023-08-09 NOTE — ED Provider Notes (Signed)
 [place] EMERGENCY DEPARTMENT AT [facility] Provider Note   CSN: [id number] Arrival date & time: 08/09/23  1210     History {Add pertinent medical, surgical, social history, OB history to HPI:1} Chief Complaint  Patient presents with   Hypotension    Terry Love is a 88 y.o. male.  88 year old male with history of symptomatic bradycardia who presents emergency department low blood pressure.  Patient went for a routine checkup today and was found to have a blood pressure in the 80s systolic.  Reports that he has had some generalized fatigue and dizziness with standing for the past 2 years but no new symptoms.  Decreased p.o. intake recently.  Still compliant with his Lasix .  Denies any chest pain, shortness of breath, nausea, vomiting, diarrhea.       Home Medications Prior to Admission medications   Medication Sig Start Date End Date Taking? Authorizing Provider  albuterol  (VENTOLIN  HFA) 108 (90 Base) MCG/ACT inhaler INHALE 2 PUFFS INTO THE LUNGS EVERY 4 HOURS AS NEEDED FOR WHEEZING OR SHORTNESS OF BREATH. 02/13/23   Diamond Formica, MD  aspirin  81 MG EC tablet Take 81 mg by mouth at bedtime.    [provider]  atorvastatin  (LIPITOR) 40 MG tablet TAKE 1 TABLET AT BEDTIME 11/12/22   Towanda Fret, MD  budesonide -formoterol  (SYMBICORT ) 160-4.5 MCG/ACT inhaler Take 2 puffs first thing in am and then another 2 puffs about 12 hours later. 07/22/23   Diamond Formica, MD  calcium -vitamin D  (OSCAL WITH D) 500-200 MG-UNIT tablet Take 1 tablet by mouth 2 (two) times daily. 10/01/16   Towanda Fret, MD  carboxymethylcellulose (REFRESH PLUS) 0.5 % SOLN  01/02/21   [provider]  dorzolamide-timolol (COSOPT) 22.3-6.8 MG/ML ophthalmic solution INSTILL 1 DROP IN EACH EYE TWO TIMES A DAY 01/02/21   [provider]  famotidine  (PEPCID ) 20 MG tablet TAKE 1 TABLET BY MOUTH EVERY DAY AFTER DINNER 04/25/23   Wert, Michael B, MD  furosemide  (LASIX ) 20 MG  tablet TAKE 1 TABLET BY MOUTH DAILY. MAY TAKE AN ADDITIONAL TABLET FOR FLUID/SWELLING. 12/03/22   Laurann Pollock, MD  latanoprost (XALATAN) 0.005 % ophthalmic solution Place 1 drop into both eyes at bedtime.    [provider]  montelukast  (SINGULAIR ) 10 MG tablet Take 1 tablet (10 mg total) by mouth at bedtime. 06/21/23   Towanda Fret, MD  Multiple Vitamin (MULTIVITAMIN WITH MINERALS) TABS tablet Take 1 tablet by mouth at bedtime.    [provider]  omeprazole  (PRILOSEC) 40 MG capsule Take 1 capsule (40 mg total) by mouth daily. 06/21/23   Towanda Fret, MD  Spacer/Aero-Holding Idelle Majors DEVI 2 puffs by Does not apply route in the morning and at bedtime. 06/25/22   Diamond Formica, MD  tamsulosin  (FLOMAX ) 0.4 MG CAPS capsule TAKE 1 CAPSULE BY MOUTH EVERY DAY 11/27/22   McKenzie, Arden Beck, MD  venlafaxine  XR (EFFEXOR -XR) 150 MG 24 hr capsule Take 1 capsule by mouth every morning. 12/07/20   [provider]  vitamin C (ASCORBIC ACID) 500 MG tablet Take 500 mg by mouth at bedtime.     [provider]  Vitamin D , Ergocalciferol , (DRISDOL ) 1.25 MG (50000 UNIT) CAPS capsule Take 1 capsule (50,000 Units total) by mouth every Monday. In the morning 06/24/23   Towanda Fret, MD      Allergies    Patient has no known allergies.    Review of Systems   Review of Systems  Physical Exam Updated Vital Signs BP 104/83 (BP Location: Right Arm)   Pulse 63   Temp 98.2 F (36.8 C) (Oral)   Resp 15   Ht 5\' 10"  (1.778 m)   Wt 90.7 kg   SpO2 94%   BMI 28.70 kg/m  Physical Exam Vitals and nursing note reviewed.  Constitutional:      General: He is not in acute distress.    Appearance: He is well-developed.  HENT:     Head: Normocephalic and atraumatic.     Right Ear: External ear normal.     Left Ear: External ear normal.     Nose: Nose normal.  Eyes:     Extraocular Movements: Extraocular movements intact.     Conjunctiva/sclera: Conjunctivae  normal.     Pupils: Pupils are equal, round, and reactive to light.  Cardiovascular:     Rate and Rhythm: Normal rate and regular rhythm.     Heart sounds: Normal heart sounds.  Pulmonary:     Effort: Pulmonary effort is normal. No respiratory distress.     Breath sounds: Normal breath sounds.  Musculoskeletal:     Cervical back: Normal range of motion and neck supple.     Right lower leg: Edema (1+) present.     Left lower leg: Edema (1+) present.  Skin:    General: Skin is warm and dry.  Neurological:     Mental Status: He is alert. Mental status is at baseline.  Psychiatric:        Mood and Affect: Mood normal.        Behavior: Behavior normal.     ED Results / Procedures / Treatments   Labs (all labs ordered are listed, but only abnormal results are displayed) Labs Reviewed - No data to display  EKG None  Radiology No results found.  Procedures Procedures  {Document cardiac monitor, telemetry assessment procedure when appropriate:1}  Medications Ordered in ED Medications - No data to display  ED Course/ Medical Decision Making/ A&P   {   Click here for ABCD2, HEART and other calculatorsREFRESH Note before signing :1}                              Medical Decision Making Amount and/or Complexity of Data Reviewed Labs: ordered. Radiology: ordered.   ***  {Document critical care time when appropriate:1} {Document review of labs and clinical decision tools ie heart score, Chads2Vasc2 etc:1}  {Document your independent review of radiology images, and any outside records:1} {Document your discussion with family members, caretakers, and with consultants:1} {Document social determinants of health affecting pt's care:1} {Document your decision making why or why not admission, treatments were needed:1} Final Clinical Impression(s) / ED Diagnoses Final diagnoses:  None    Rx / DC Orders ED Discharge Orders     None

## 2023-08-12 ENCOUNTER — Telehealth: Payer: Self-pay

## 2023-08-12 NOTE — Telephone Encounter (Signed)
 Copied from CRM [id number]. Topic: Appointments - Appointment Scheduling >> Aug 12, 2023  8:12 AM Allyne Areola wrote: Patient/patient representative is calling to schedule an appointment. Refer to attachments for appointment information. Patient was sent to the ER by Dr.Simpson on 08/09/2023, he had a lot of test done and was told to follow up with his PCP within 3 days;however, Dr.Simpson next opening is not until 08/21/2023.

## 2023-08-12 NOTE — Telephone Encounter (Signed)
 Pls arrange VISIT WITH ME ONE DAY THIS WEEK LET THEM KNOW I HAVE REVIEWED ed NOTE, AND HAVE SENT TO gi RE RECOMMENDATION ABOUT ADDITIONAL TREATMENT FOR WORMS. PLS FAX REPORT WHICH IS IN YOUR BOX TO DR Riley Cheadle, GI, IN SENT HIM A STAFF MESSAGE  THANKS

## 2023-08-12 NOTE — Telephone Encounter (Signed)
 Did you want to see pt sooner than 06/18 for ER f/u?

## 2023-08-13 ENCOUNTER — Telehealth: Payer: Self-pay

## 2023-08-13 NOTE — Telephone Encounter (Signed)
 Patient scheduled for 5/7 Will call back if appt does not work

## 2023-08-13 NOTE — Telephone Encounter (Signed)
 Copied from CRM [id number]. Topic: General - Other >> Aug 13, 2023  8:23 AM Carlatta H wrote: Reason for CRM: Please call patient to schedule an appointment for this week per Dr Hudson Madeira notes in patients chart

## 2023-08-13 NOTE — Telephone Encounter (Signed)
 See other note

## 2023-08-14 ENCOUNTER — Encounter: Payer: Self-pay | Admitting: Family Medicine

## 2023-08-14 ENCOUNTER — Ambulatory Visit (INDEPENDENT_AMBULATORY_CARE_PROVIDER_SITE_OTHER): Admitting: Family Medicine

## 2023-08-14 VITALS — BP 100/68 | HR 85 | Resp 16 | Ht 70.0 in | Wt 202.0 lb

## 2023-08-14 DIAGNOSIS — Z09 Encounter for follow-up examination after completed treatment for conditions other than malignant neoplasm: Secondary | ICD-10-CM | POA: Diagnosis not present

## 2023-08-14 DIAGNOSIS — B839 Helminthiasis, unspecified: Secondary | ICD-10-CM | POA: Diagnosis not present

## 2023-08-14 NOTE — Patient Instructions (Addendum)
 F/U in June  as before, call id you need me sooner  Oral rehydration saltsis what was recommended, however since no loger dehydrated will not need to get this  Take HALF lasix  daily, not a whole tablet, Cardiologuy will re assess innext several weeks. Pls contact me if you start developing swelling of the legs  We will  call on Friday re update re worm treatment based on gI response  I am sending for a sooner appointment with Cardiology and will reach out to Dr Amanda Jungling,  to see if it is possible to get you in sooner, youy currently have a 2 week f/u  Thanks for choosing [facility], we consider it a privelige to serve you.

## 2023-08-15 NOTE — Progress Notes (Signed)
 Remote pacemaker transmission.

## 2023-08-16 ENCOUNTER — Telehealth: Payer: Self-pay

## 2023-08-16 NOTE — Telephone Encounter (Signed)
 Tried calling pt wife to let her know per Dr. Rodolph Clap, "I have no specific follow up instructions regarding the need to repeat treatment for worms. So, she should reach out to the provider at the [place] as we had discussed.

## 2023-08-21 ENCOUNTER — Encounter: Payer: Self-pay | Admitting: Family Medicine

## 2023-08-21 NOTE — Assessment & Plan Note (Addendum)
 Needs GI re evaluation

## 2023-08-21 NOTE — Assessment & Plan Note (Signed)
 Treated  through the [place] inadequately controlled, chronic, not suicidal or homicidal

## 2023-08-21 NOTE — Assessment & Plan Note (Addendum)
 Patient in for follow up of recent ED evaluation Discharge summary, and laboratory and radiology data are reviewed, and any questions or concerns  are discussed. Specific issues requiring follow up are specifically addressed.has upcoming cardiology appt No changes in medication management Had mild anemia , unchanged from prior , no labs ordered

## 2023-08-21 NOTE — Telephone Encounter (Signed)
 Pls call pt and spouse , let them know I did get a response from GI  ( Dr Rourk)and the plan is to re evaluate him in the office re the worm infestation.

## 2023-08-21 NOTE — Progress Notes (Signed)
   Terry Love     MRN: [id number]      DOB: [birth year]  Chief Complaint  Patient presents with   Medical Management of Chronic Issues    Follow up / medication management     HPI Terry Love is here for follow up and re-evaluation of chronic medical conditions, medication management and review of any available recent lab and radiology data.  Preventive health is updated, specifically  Cancer screening and Immunization.   Questions or concerns regarding consultations or procedures which the PT has had in the interim are  addressed. The Terry Love feeling weak and giving out of breath with minimal activity States he feels quivering across upper abdomen left so feels he still has worms and he had the [facility] rept stool which showed minimal presence of worms. I will reach out to GI for guidance ROS Denies recent fever or chills. Denies sinus pressure, nasal congestion, ear pain or sore throat. Denies chest congestion, productive cough or wheezing. .   Denies dysuria, frequency, hesitancy or incontinence. Denies uncontrolled  joint pain, swelling and limitation in mobility. Denies headaches, seizures, numbness, or tingling. Denies depression, anxiety or insomnia. Denies skin break down or rash.   PE  BP (!) 86/54   Pulse 68   Resp 16   Ht 5\' 10"  (1.778 m)   Wt 202 lb (91.6 kg)   SpO2 94%   BMI 28.98 kg/m   Patient alert and oriented   HEENT: No facial asymmetry, EOMI,     Neck decreased ROM.  Chest: Clear to auscultation bilaterally.  CVS: S1, S2 no murmurs, no S3.Regular rate.  ABD: Soft non tender.   Ext: No edema  MS: decreased ROM spine, shoulders, hips and knees.  Skin: Intact, no ulcerations or rash noted.  Psych: Good eye contact, blunted  affect. r depressed appearing.  CNS: CN 2-12 intact, power,  normal throughout.no focal deficits noted.   Assessment & Plan  Hypotension Hypotension with poor exercise tolerance  Needs eval in ED directed to go there  directly and discussed with ED physician  DOE (dyspnea on exertion) Reports worsening of this symptom needs sooner appt with cardiology if possible and ED eval today  Worm infestation Reports rept testing by [facility] shows ongoing infestation though test that was done a few weeks ago showed none, will defer to GI for ongoing management  Depression, major, single episode, in partial remission (HCC) Treated  through the [place] inadequately controlled, chronic, not suicidal or homicidal

## 2023-08-21 NOTE — Progress Notes (Signed)
   Terry Love     MRN: [id number]      DOB: 1935/05/09  Chief Complaint  Patient presents with   ER follow up     HPI Terry Love is here for follow up of ED visit on 5/2 when he was found to be dehydrated and hypotensive. Feels marginally better but still reports poor exercise tolerance which seems to be worsening over past several weeks There is also concern about possible ongoing worm infestation   ROS Denies recent fever or chills. Denies sinus pressure, nasal congestion, ear pain or sore throat. Denies chest congestion, productive cough or wheezing. .   Denies dysuria, frequency, hesitancy or incontinence. Denies joint pain, swelling and limitation in mobility. Denies headaches, seizures, numbness, or tingling. Denies depression, anxiety or insomnia. Denies skin break down or rash.   PE  BP 100/68   Pulse 85   Resp 16   Ht 5\' 10"  (1.778 m)   Wt 202 lb (91.6 kg)   SpO2 92%   BMI 28.98 kg/m   Patient alert and oriented and in no cardiopulmonary distress.  HEENT: No facial asymmetry, EOMI,     Neck supple .  Chest: Clear to auscultation bilaterally.  CVS: S1, S2 no murmurs, no S3.Regular rate.  ABD: Soft non tender.   Ext: No edema  MS: Adequate ROM spine, shoulders, hips and knees.  Skin: Intact, no ulcerations or rash noted.  Psych: Good eye contact, normal affect. Memory intact not anxious or depressed appearing.  CNS: CN 2-12 intact, power,  normal throughout.no focal deficits noted.   Assessment & Plan  Encounter for examination following treatment at hospital Patient in for follow up of recent ED evaluation Discharge summary, and laboratory and radiology data are reviewed, and any questions or concerns  are discussed. Specific issues requiring follow up are specifically addressed.has upcoming cardiology appt No changes in medication management Had mild anemia , unchanged from prior , no labs ordered   Worm infestation Needs GI re  evaluation

## 2023-08-21 NOTE — Assessment & Plan Note (Signed)
 Reports worsening of this symptom needs sooner appt with cardiology if possible and ED eval today

## 2023-08-21 NOTE — Assessment & Plan Note (Signed)
 Hypotension with poor exercise tolerance  Needs eval in ED directed to go there directly and discussed with ED physician

## 2023-08-21 NOTE — Assessment & Plan Note (Signed)
 Reports rept testing by [facility] shows ongoing infestation though test that was done a few weeks ago showed none, will defer to GI for ongoing management

## 2023-08-23 NOTE — Telephone Encounter (Signed)
 Pt wife aware

## 2023-08-30 ENCOUNTER — Ambulatory Visit: Admitting: Medical

## 2023-09-04 ENCOUNTER — Ambulatory Visit: Admitting: Family Medicine

## 2023-09-16 ENCOUNTER — Ambulatory Visit (INDEPENDENT_AMBULATORY_CARE_PROVIDER_SITE_OTHER): Admitting: Gastroenterology

## 2023-09-16 ENCOUNTER — Encounter: Payer: Self-pay | Admitting: Gastroenterology

## 2023-09-16 ENCOUNTER — Other Ambulatory Visit: Payer: Self-pay

## 2023-09-16 VITALS — BP 106/65 | HR 66 | Temp 97.8°F | Ht 70.0 in | Wt 197.4 lb

## 2023-09-16 DIAGNOSIS — D649 Anemia, unspecified: Secondary | ICD-10-CM | POA: Diagnosis not present

## 2023-09-16 DIAGNOSIS — Z8619 Personal history of other infectious and parasitic diseases: Secondary | ICD-10-CM

## 2023-09-16 DIAGNOSIS — B839 Helminthiasis, unspecified: Secondary | ICD-10-CM

## 2023-09-16 DIAGNOSIS — D509 Iron deficiency anemia, unspecified: Secondary | ICD-10-CM

## 2023-09-16 NOTE — Patient Instructions (Signed)
 Collect stool specimen on three separate days to evaluate for ongoing parasite infection. We will recheck anemia after we document resolution of parasite infection.

## 2023-09-16 NOTE — Progress Notes (Signed)
 GI Office Note    Referring Provider: Towanda Fret, MD Primary Care Physician:  Towanda Fret, MD  Primary Gastroenterologist: Rheba Cedar, MD   Chief Complaint   Chief Complaint  Patient presents with   Follow-up    Doing well, no issues    History of Present Illness   Terry Love is a 88 y.o. male presenting today for follow up. Last seen 07/2023.   Patient has history of infestation with Dipylidium (tapeworm). First tested by his veterinarian friend. Submitted stool specimen and was looked at under microscope, patient was told he had dipylidium worms. Patient saw PCP who treated with albendazole . Follow up parasite worm ID of stool (no worms seen) in 06/2023 was negative. Patient states he submitted another stool to his vet in 08/2023, per his report larvae was seen. PCP advised patient to return to our office for ongoing management.   Also with mild anemia but no iron def. Last colonoscopy 2015 with 3 tiny polyps removed, 2 hyperplastic and one 3mm adenoma. It was recommended he consider 1 more colonoscopy at 9 years but he never had this done. Now he is 88 years old. He also has chronic mild anemia as outlined below. Iron/B12/folate labs normal.  He had recent ED visit for dehydration/hypotension. Stayed outside, hot, didn't eat/drink well that day. Patient reports he was busy for 12 hours outside in his building. Trying to occupy his mind, has PTSD/depression.   Today: clinically feeling well. At first diagnosis of worms, he states he was having constant hunger pains, feeling like something crawling inside. No longer having these symptoms. Appetite is good. BMs normal. Has 2-3 stools daily. Eats a lot of vegetables. No melena, brbpr.    Component     Latest Ref Rng 11/06/2018 05/12/2019 05/11/2020 06/27/2020 10/28/2020 07/05/2021 07/11/2022  Hemoglobin     13.0 - 17.0 g/dL 16.1 (L)  09.6  04.5  40.9 (L)  12.3 (L)  12.7 (L)  12.9 (L)   HCT     39.0 - 52.0 % 39.0   40.9  40.1  37.6 (L)  37.2 (L)  38.7  39.1    Component     Latest Ref Rng 09/14/2022 05/23/2023 06/21/2023 08/09/2023  Hemoglobin     13.0 - 17.0 g/dL 81.1 (L)  91.4 (L)  78.2 (L)  12.3 (L)   HCT     39.0 - 52.0 % 38.3  40.5  38.4  39.1      Wt Readings from Last 10 Encounters:  09/16/23 197 lb 6.4 oz (89.5 kg)  08/14/23 202 lb (91.6 kg)  08/09/23 200 lb (90.7 kg)  08/09/23 202 lb (91.6 kg)  07/30/23 198 lb 6.4 oz (90 kg)  06/21/23 205 lb 0.6 oz (93 kg)  05/06/23 206 lb (93.4 kg)  01/15/23 201 lb 1.9 oz (91.2 kg)  01/09/23 205 lb (93 kg)  12/18/22 205 lb 12.8 oz (93.4 kg)        Medications   Current Outpatient Medications  Medication Sig Dispense Refill   albuterol  (VENTOLIN  HFA) 108 (90 Base) MCG/ACT inhaler INHALE 2 PUFFS INTO THE LUNGS EVERY 4 HOURS AS NEEDED FOR WHEEZING OR SHORTNESS OF BREATH. 18 each 5   aspirin  81 MG EC tablet Take 81 mg by mouth at bedtime.     atorvastatin  (LIPITOR) 40 MG tablet TAKE 1 TABLET AT BEDTIME 90 tablet 3   budesonide -formoterol  (SYMBICORT ) 160-4.5 MCG/ACT inhaler Take 2 puffs first thing in am and then  another 2 puffs about 12 hours later. 1 each 6   calcium -vitamin D  (OSCAL WITH D) 500-200 MG-UNIT tablet Take 1 tablet by mouth 2 (two) times daily. (Patient taking differently: Take 1 tablet by mouth at bedtime.) 150 tablet 1   carboxymethylcellulose (REFRESH PLUS) 0.5 % SOLN Place 1 drop into both eyes 2 (two) times daily.     dorzolamide-timolol (COSOPT) 22.3-6.8 MG/ML ophthalmic solution Place 1 drop into both eyes 2 (two) times daily.     famotidine  (PEPCID ) 20 MG tablet TAKE 1 TABLET BY MOUTH EVERY DAY AFTER DINNER 90 tablet 3   fluticasone (FLONASE) 50 MCG/ACT nasal spray Place 1 spray into both nostrils daily.     furosemide  (LASIX ) 20 MG tablet Take half tablet by mouth once daily     latanoprost (XALATAN) 0.005 % ophthalmic solution Place 1 drop into both eyes at bedtime.     Multiple Vitamin (MULTIVITAMIN WITH MINERALS) TABS tablet  Take 1 tablet by mouth at bedtime.     omeprazole  (PRILOSEC) 40 MG capsule Take 1 capsule (40 mg total) by mouth daily. 90 capsule 1   tamsulosin  (FLOMAX ) 0.4 MG CAPS capsule TAKE 1 CAPSULE BY MOUTH EVERY DAY 90 capsule 3   venlafaxine  XR (EFFEXOR -XR) 150 MG 24 hr capsule Take 1 capsule by mouth every morning.     vitamin C (ASCORBIC ACID) 500 MG tablet Take 500 mg by mouth at bedtime.      Vitamin D , Ergocalciferol , (DRISDOL ) 1.25 MG (50000 UNIT) CAPS capsule Take 1 capsule (50,000 Units total) by mouth every Monday. In the morning 12 capsule 1   No current facility-administered medications for this visit.    Allergies   Allergies as of 09/16/2023   (No Known Allergies)     Review of Systems   General: Negative for anorexia, weight loss, fever, chills, +fatigue, weakness. ENT: Negative for hoarseness, difficulty swallowing , nasal congestion. CV: Negative for chest pain, angina, palpitations, dyspnea on exertion, peripheral edema.  Respiratory: Negative for dyspnea at rest, dyspnea on exertion, cough, sputum, wheezing.  GI: See history of present illness. GU:  Negative for dysuria, hematuria, urinary incontinence, urinary frequency, nocturnal urination.  Endo: Negative for unusual weight change.     Physical Exam   BP 106/65 (BP Location: Right Arm, Patient Position: Sitting, Cuff Size: Large)   Pulse 66   Temp 97.8 F (36.6 C) (Oral)   Ht 5\' 10"  (1.778 m)   Wt 197 lb 6.4 oz (89.5 kg)   SpO2 97%   BMI 28.32 kg/m    General: Well-nourished, well-developed in no acute distress. Hard of hearing. Wife presesnt Eyes: No icterus. Mouth: Oropharyngeal mucosa moist and pink   Lungs: Clear to auscultation bilaterally.  Heart: Regular rate and rhythm, no murmurs rubs or gallops.  Abdomen: Bowel sounds are normal, nontender, nondistended, no hepatosplenomegaly or masses,  no abdominal bruits or hernia , no rebound or guarding.  Rectal: not performed  Extremities: No lower  extremity edema. No clubbing or deformities. Neuro: Alert and oriented x 4   Skin: Warm and dry, no jaundice.   Psych: Alert and cooperative, normal mood and affect.  Labs   Lab Results  Component Value Date   WBC 5.3 08/09/2023   HGB 12.3 (L) 08/09/2023   HCT 39.1 08/09/2023   MCV 95.6 08/09/2023   PLT 268 08/09/2023   Lab Results  Component Value Date   NA 137 08/09/2023   CL 105 08/09/2023   K 3.9 08/09/2023   CO2  24 08/09/2023   BUN 17 08/09/2023   CREATININE 0.88 08/09/2023   GFRNONAA >60 08/09/2023   CALCIUM  9.4 08/09/2023   ALBUMIN 3.6 08/09/2023   GLUCOSE 109 (H) 08/09/2023   Lab Results  Component Value Date   ALT 26 08/09/2023   AST 28 08/09/2023   ALKPHOS 51 08/09/2023   BILITOT 0.7 08/09/2023   Lab Results  Component Value Date   IRON 80 06/21/2023   TIBC 245 (L) 06/21/2023   FERRITIN 208 06/21/2023   Lab Results  Component Value Date   VITAMINB12 904 06/21/2023   Lab Results  Component Value Date   FOLATE 19.3 06/21/2023    Imaging Studies   No results found.  Assessment/Plan:   Worm infestation (dipylidium) diagnosed by his good friend Environmental manager): treated with course of albendazole . Follow up parasite worm ID (no worm seen by patient) 06/2023 was negative. Subsequent stool examined by veterinarian saw larvae per patient. Clinically feels better. Discussed that more appropriate stool testing may be for ova if he has not identified a worm in his stool.  -O+P X 3 to be completed -will not rule out another course of albendazole   Mild normocytic anemia: dates back several years. Hct normal. No IDA/B12/Folate deficiency. Likely insignificant. -recheck CBC once document no ongoing parasite infection -monitor for overt GI bleeding      Trudie Fuse. Harles Lied, MHS, PA-C [facility]

## 2023-09-17 ENCOUNTER — Other Ambulatory Visit (HOSPITAL_COMMUNITY)
Admission: RE | Admit: 2023-09-17 | Discharge: 2023-09-17 | Disposition: A | Discharge status: Home / Self Care | Source: Ambulatory Visit | Attending: Gastroenterology | Admitting: Gastroenterology

## 2023-09-17 DIAGNOSIS — B839 Helminthiasis, unspecified: Secondary | ICD-10-CM | POA: Insufficient documentation

## 2023-09-18 ENCOUNTER — Other Ambulatory Visit (HOSPITAL_COMMUNITY)
Admission: RE | Admit: 2023-09-18 | Discharge: 2023-09-18 | Disposition: A | Discharge status: Home / Self Care | Source: Ambulatory Visit | Attending: Gastroenterology | Admitting: Gastroenterology

## 2023-09-18 DIAGNOSIS — B839 Helminthiasis, unspecified: Secondary | ICD-10-CM | POA: Diagnosis not present

## 2023-09-19 ENCOUNTER — Other Ambulatory Visit (HOSPITAL_COMMUNITY)
Admission: RE | Admit: 2023-09-19 | Discharge: 2023-09-19 | Disposition: A | Discharge status: Home / Self Care | Source: Ambulatory Visit | Attending: Gastroenterology | Admitting: Gastroenterology

## 2023-09-19 DIAGNOSIS — B839 Helminthiasis, unspecified: Secondary | ICD-10-CM | POA: Insufficient documentation

## 2023-09-25 ENCOUNTER — Ambulatory Visit (INDEPENDENT_AMBULATORY_CARE_PROVIDER_SITE_OTHER): Admitting: Family Medicine

## 2023-09-25 ENCOUNTER — Encounter: Payer: Self-pay | Admitting: Family Medicine

## 2023-09-25 VITALS — BP 100/60 | HR 63 | Resp 16 | Ht 70.0 in | Wt 205.0 lb

## 2023-09-25 DIAGNOSIS — R7302 Impaired glucose tolerance (oral): Secondary | ICD-10-CM | POA: Insufficient documentation

## 2023-09-25 DIAGNOSIS — I495 Sick sinus syndrome: Secondary | ICD-10-CM | POA: Diagnosis not present

## 2023-09-25 DIAGNOSIS — F324 Major depressive disorder, single episode, in partial remission: Secondary | ICD-10-CM | POA: Diagnosis not present

## 2023-09-25 DIAGNOSIS — G20C Parkinsonism, unspecified: Secondary | ICD-10-CM

## 2023-09-25 DIAGNOSIS — E66811 Obesity, class 1: Secondary | ICD-10-CM | POA: Diagnosis not present

## 2023-09-25 DIAGNOSIS — E785 Hyperlipidemia, unspecified: Secondary | ICD-10-CM | POA: Diagnosis not present

## 2023-09-25 DIAGNOSIS — B839 Helminthiasis, unspecified: Secondary | ICD-10-CM | POA: Diagnosis not present

## 2023-09-25 DIAGNOSIS — R5383 Other fatigue: Secondary | ICD-10-CM | POA: Diagnosis not present

## 2023-09-25 NOTE — Assessment & Plan Note (Signed)
 Patient educated about the importance of limiting  Carbohydrate intake , the need to commit to daily physical activity for a minimum of 30 minutes , and to commit weight loss. The fact that changes in all these areas will reduce or eliminate all together the development of diabetes is stressed.      Latest Ref Rng & Units 08/09/2023    1:40 PM 05/23/2023    9:30 AM 01/07/2023    9:07 AM 11/16/2022   11:44 AM 07/11/2022    9:23 AM  Diabetic Labs  HbA1c 4.8 - 5.6 %   5.5   5.7   Chol 100 - 199 mg/dL  845  845     HDL >60 mg/dL  55  55     Calc LDL 0 - 99 mg/dL  88  86     Triglycerides 0 - 149 mg/dL  54  66     Creatinine 0.61 - 1.24 mg/dL 9.11  9.17  9.17  9.03        09/25/2023   10:41 AM 09/25/2023   10:08 AM 09/16/2023    8:47 AM 08/14/2023    3:29 PM 08/14/2023    2:19 PM 08/09/2023    3:30 PM 08/09/2023   12:43 PM  BP/Weight  Systolic BP 100 88 106 100 112 123 104  Diastolic BP 60 51 65 68 53 76 83  Wt. (Lbs)  205.04 197.4  202    BMI  29.42 kg/m2 28.32 kg/m2  28.98 kg/m2         No data to display

## 2023-09-25 NOTE — Assessment & Plan Note (Signed)
 Update Thyroid  function

## 2023-09-25 NOTE — Assessment & Plan Note (Signed)
 Hyperlipidemia:Low fat diet discussed and encouraged.   Lipid Panel  Lab Results  Component Value Date   CHOL 154 05/23/2023   HDL 55 05/23/2023   LDLCALC 88 05/23/2023   TRIG 54 05/23/2023   CHOLHDL 2.8 05/23/2023     Controlled, no change in medication

## 2023-09-25 NOTE — Patient Instructions (Addendum)
 F/U in 4 months, call if you need me sooner  HBA1C, cmp and EgFR, TSH and free T4 today  No med changes  Thanks for choosing [facility], we consider it a privelige to serve you.

## 2023-09-26 ENCOUNTER — Ambulatory Visit: Payer: Self-pay | Admitting: Family Medicine

## 2023-09-26 ENCOUNTER — Ambulatory Visit: Payer: Self-pay | Admitting: Gastroenterology

## 2023-09-26 LAB — HEMOGLOBIN A1C
Est. average glucose Bld gHb Est-mCnc: 108 mg/dL
Hgb A1c MFr Bld: 5.4 % (ref 4.8–5.6)

## 2023-09-26 LAB — CMP14+EGFR
ALT: 17 IU/L (ref 0–44)
AST: 23 IU/L (ref 0–40)
Albumin: 4.1 g/dL (ref 3.7–4.7)
Alkaline Phosphatase: 66 IU/L (ref 44–121)
BUN/Creatinine Ratio: 20 (ref 10–24)
BUN: 18 mg/dL (ref 8–27)
Bilirubin Total: 0.4 mg/dL (ref 0.0–1.2)
CO2: 22 mmol/L (ref 20–29)
Calcium: 9.6 mg/dL (ref 8.6–10.2)
Chloride: 106 mmol/L (ref 96–106)
Creatinine, Ser: 0.88 mg/dL (ref 0.76–1.27)
Globulin, Total: 2.3 g/dL (ref 1.5–4.5)
Glucose: 86 mg/dL (ref 70–99)
Potassium: 4.5 mmol/L (ref 3.5–5.2)
Sodium: 141 mmol/L (ref 134–144)
Total Protein: 6.4 g/dL (ref 6.0–8.5)
eGFR: 84 mL/min/{1.73_m2} (ref >59)

## 2023-09-26 LAB — O&P RESULT

## 2023-09-26 LAB — OVA + PARASITE EXAM

## 2023-09-26 LAB — MAGNESIUM: Magnesium: 2.2 mg/dL (ref 1.6–2.3)

## 2023-09-26 LAB — TSH+FREE T4
Free T4: 0.89 ng/dL (ref 0.82–1.77)
TSH: 1.33 u[IU]/mL (ref 0.450–4.500)

## 2023-09-27 ENCOUNTER — Ambulatory Visit: Payer: Self-pay | Admitting: Gastroenterology

## 2023-09-27 LAB — OVA + PARASITE EXAM

## 2023-09-27 LAB — O&P RESULT

## 2023-09-29 ENCOUNTER — Emergency Department (HOSPITAL_COMMUNITY)
Admission: EM | Admit: 2023-09-29 | Discharge: 2023-09-29 | Disposition: A | Discharge status: Home / Self Care | Attending: Emergency Medicine | Admitting: Emergency Medicine

## 2023-09-29 ENCOUNTER — Emergency Department (HOSPITAL_COMMUNITY)

## 2023-09-29 ENCOUNTER — Encounter (HOSPITAL_COMMUNITY): Payer: Self-pay | Admitting: *Deleted

## 2023-09-29 ENCOUNTER — Other Ambulatory Visit: Payer: Self-pay

## 2023-09-29 DIAGNOSIS — J40 Bronchitis, not specified as acute or chronic: Secondary | ICD-10-CM | POA: Insufficient documentation

## 2023-09-29 DIAGNOSIS — J209 Acute bronchitis, unspecified: Secondary | ICD-10-CM | POA: Diagnosis not present

## 2023-09-29 DIAGNOSIS — J4521 Mild intermittent asthma with (acute) exacerbation: Secondary | ICD-10-CM | POA: Diagnosis not present

## 2023-09-29 DIAGNOSIS — Z7982 Long term (current) use of aspirin: Secondary | ICD-10-CM | POA: Diagnosis not present

## 2023-09-29 DIAGNOSIS — Z7952 Long term (current) use of systemic steroids: Secondary | ICD-10-CM | POA: Insufficient documentation

## 2023-09-29 DIAGNOSIS — R059 Cough, unspecified: Secondary | ICD-10-CM | POA: Insufficient documentation

## 2023-09-29 DIAGNOSIS — R0602 Shortness of breath: Secondary | ICD-10-CM | POA: Diagnosis not present

## 2023-09-29 DIAGNOSIS — R0989 Other specified symptoms and signs involving the circulatory and respiratory systems: Secondary | ICD-10-CM | POA: Diagnosis not present

## 2023-09-29 LAB — RESP PANEL BY RT-PCR (RSV, FLU A&B, COVID)  RVPGX2
Influenza A by PCR: NEGATIVE
Influenza B by PCR: NEGATIVE
Resp Syncytial Virus by PCR: NEGATIVE
SARS Coronavirus 2 by RT PCR: NEGATIVE

## 2023-09-29 LAB — BASIC METABOLIC PANEL WITH GFR
Anion gap: 11 (ref 5–15)
BUN: 14 mg/dL (ref 8–23)
CO2: 26 mmol/L (ref 22–32)
Calcium: 9.2 mg/dL (ref 8.9–10.3)
Chloride: 102 mmol/L (ref 98–111)
Creatinine, Ser: 0.86 mg/dL (ref 0.61–1.24)
GFR, Estimated: >60 mL/min (ref >60)
Glucose, Bld: 105 mg/dL — ABNORMAL HIGH (ref 70–99)
Potassium: 4 mmol/L (ref 3.5–5.1)
Sodium: 139 mmol/L (ref 135–145)

## 2023-09-29 LAB — CBC
HCT: 40.1 % (ref 39.0–52.0)
Hemoglobin: 13 g/dL (ref 13.0–17.0)
MCH: 31.3 pg (ref 26.0–34.0)
MCHC: 32.4 g/dL (ref 30.0–36.0)
MCV: 96.4 fL (ref 80.0–100.0)
Platelets: 277 10*3/uL (ref 150–400)
RBC: 4.16 MIL/uL — ABNORMAL LOW (ref 4.22–5.81)
RDW: 12.1 % (ref 11.5–15.5)
WBC: 5.3 10*3/uL (ref 4.0–10.5)
nRBC: 0 % (ref 0.0–0.2)

## 2023-09-29 LAB — BRAIN NATRIURETIC PEPTIDE: B Natriuretic Peptide: 35 pg/mL (ref 0.0–100.0)

## 2023-09-29 LAB — TROPONIN I (HIGH SENSITIVITY)
Troponin I (High Sensitivity): 8 ng/L (ref <18)
Troponin I (High Sensitivity): 8 ng/L (ref <18)

## 2023-09-29 MED ORDER — METHYLPREDNISOLONE SODIUM SUCC 125 MG IJ SOLR
60.0000 mg | Freq: Once | INTRAMUSCULAR | Status: AC
  Administered 2023-09-29: 60 mg via INTRAVENOUS
  Filled 2023-09-29: qty 2

## 2023-09-29 MED ORDER — IPRATROPIUM-ALBUTEROL 0.5-2.5 (3) MG/3ML IN SOLN
3.0000 mL | Freq: Once | RESPIRATORY_TRACT | Status: AC
  Administered 2023-09-29: 3 mL via RESPIRATORY_TRACT
  Filled 2023-09-29: qty 3

## 2023-09-29 MED ORDER — PREDNISONE 50 MG PO TABS: 50.0000 mg | ORAL_TABLET | Freq: Every day | ORAL | 0 refills | Status: DC

## 2023-09-29 MED ORDER — DOXYCYCLINE HYCLATE 100 MG PO CAPS: 100.0000 mg | ORAL_CAPSULE | Freq: Two times a day (BID) | ORAL | 0 refills | Status: AC

## 2023-09-29 MED ORDER — DOXYCYCLINE HYCLATE 100 MG PO TABS
100.0000 mg | ORAL_TABLET | Freq: Once | ORAL | Status: AC
  Administered 2023-09-29: 100 mg via ORAL
  Filled 2023-09-29: qty 1

## 2023-09-29 NOTE — ED Notes (Signed)
 Pt ambulated with pulse ox. Pt sats held in the 90s for most of the ambulation, but did drop into the upper 80s before working its way back up. Pt was able to ambulate with NAD. Pt stated he felt good during the ambulation. Did not ask to stop or rest.

## 2023-09-29 NOTE — Assessment & Plan Note (Signed)
 Psych med withsrawn with improvement in tremot

## 2023-09-29 NOTE — ED Provider Notes (Signed)
 [place] EMERGENCY DEPARTMENT AT [facility] Provider Note   CSN: [id number] Arrival date & time: 09/29/23  1619     Patient presents with: Shortness of Breath   Terry Love is a 88 y.o. male.   Pt is a 88 yo male with pmhx significant for hld, presbycusis, GERD, glaucoma, depression, and asthma.  Pt has had sob since yesterday.  He's had some mild cp and cough.  He has been using his inhalers without improvement in sx.  No fevers.  No known sick contacts.       Prior to Admission medications   Medication Sig Start Date End Date Taking? Authorizing Provider  doxycycline  (VIBRAMYCIN ) 100 MG capsule Take 1 capsule (100 mg total) by mouth 2 (two) times daily. 09/29/23  Yes Dean Clarity, MD  predniSONE  (DELTASONE ) 50 MG tablet Take 1 tablet (50 mg total) by mouth daily with breakfast. 09/29/23  Yes Dean Clarity, MD  albuterol  (VENTOLIN  HFA) 108 (90 Base) MCG/ACT inhaler INHALE 2 PUFFS INTO THE LUNGS EVERY 4 HOURS AS NEEDED FOR WHEEZING OR SHORTNESS OF BREATH. 02/13/23   Darlean Ozell NOVAK, MD  aspirin  81 MG EC tablet Take 81 mg by mouth at bedtime.    [provider]  atorvastatin  (LIPITOR) 40 MG tablet TAKE 1 TABLET AT BEDTIME 11/12/22   Antonetta Rollene BRAVO, MD  budesonide -formoterol  (SYMBICORT ) 160-4.5 MCG/ACT inhaler Take 2 puffs first thing in am and then another 2 puffs about 12 hours later. 07/22/23   Darlean Ozell NOVAK, MD  calcium -vitamin D  (OSCAL WITH D) 500-200 MG-UNIT tablet Take 1 tablet by mouth 2 (two) times daily. Patient taking differently: Take 1 tablet by mouth at bedtime. 10/01/16   Antonetta Rollene BRAVO, MD  carboxymethylcellulose (REFRESH PLUS) 0.5 % SOLN Place 1 drop into both eyes 2 (two) times daily. 01/02/21   [provider]  dorzolamide-timolol (COSOPT) 22.3-6.8 MG/ML ophthalmic solution Place 1 drop into both eyes 2 (two) times daily. 01/02/21   [provider]  famotidine  (PEPCID ) 20 MG tablet TAKE 1 TABLET BY MOUTH EVERY DAY  AFTER DINNER 04/25/23   Wert, Michael B, MD  fluticasone (FLONASE) 50 MCG/ACT nasal spray Place 1 spray into both nostrils daily.    [provider]  furosemide  (LASIX ) 20 MG tablet Take half tablet by mouth once daily 08/14/23   Antonetta Rollene BRAVO, MD  latanoprost (XALATAN) 0.005 % ophthalmic solution Place 1 drop into both eyes at bedtime.    [provider]  Multiple Vitamin (MULTIVITAMIN WITH MINERALS) TABS tablet Take 1 tablet by mouth at bedtime.    [provider]  omeprazole  (PRILOSEC) 40 MG capsule Take 1 capsule (40 mg total) by mouth daily. 06/21/23   Antonetta Rollene BRAVO, MD  tamsulosin  (FLOMAX ) 0.4 MG CAPS capsule TAKE 1 CAPSULE BY MOUTH EVERY DAY 11/27/22   McKenzie, Belvie CROME, MD  venlafaxine  XR (EFFEXOR -XR) 150 MG 24 hr capsule Take 1 capsule by mouth every morning. 12/07/20   [provider]  vitamin C (ASCORBIC ACID) 500 MG tablet Take 500 mg by mouth at bedtime.     [provider]  Vitamin D , Ergocalciferol , (DRISDOL ) 1.25 MG (50000 UNIT) CAPS capsule Take 1 capsule (50,000 Units total) by mouth every Monday. In the morning 06/24/23   Antonetta Rollene BRAVO, MD    Allergies: Patient has no known allergies.    Review of Systems  Respiratory:  Positive for cough and shortness of breath.   All other systems reviewed and are negative.  Updated Vital Signs BP (!) 143/69   Pulse 63   Temp (!) 97.5 F (36.4 C) (Temporal)   Resp (!) 22   Ht 5' 10 (1.778 m)   Wt 90.7 kg   SpO2 97%   BMI 28.70 kg/m   Physical Exam Vitals and nursing note reviewed.  Constitutional:      Appearance: He is well-developed.  HENT:     Head: Normocephalic and atraumatic.     Mouth/Throat:     Mouth: Mucous membranes are moist.     Pharynx: Oropharynx is clear.   Eyes:     Extraocular Movements: Extraocular movements intact.     Pupils: Pupils are equal, round, and reactive to light.    Cardiovascular:     Rate and Rhythm: Normal rate and regular  rhythm.  Pulmonary:     Effort: Pulmonary effort is normal.     Breath sounds: Normal breath sounds.  Abdominal:     General: Bowel sounds are normal.     Palpations: Abdomen is soft.   Musculoskeletal:        General: Normal range of motion.     Cervical back: Normal range of motion and neck supple.   Skin:    General: Skin is warm.     Capillary Refill: Capillary refill takes less than 2 seconds.   Neurological:     General: No focal deficit present.     Mental Status: He is alert and oriented to person, place, and time.   Psychiatric:        Mood and Affect: Mood normal.        Behavior: Behavior normal.     (all labs ordered are listed, but only abnormal results are displayed) Labs Reviewed  BASIC METABOLIC PANEL WITH GFR - Abnormal; Notable for the following components:      Result Value   Glucose, Bld 105 (*)    All other components within normal limits  CBC - Abnormal; Notable for the following components:   RBC 4.16 (*)    All other components within normal limits  RESP PANEL BY RT-PCR (RSV, FLU A&B, COVID)  [id number]  BRAIN NATRIURETIC PEPTIDE  TROPONIN I (HIGH SENSITIVITY)  TROPONIN I (HIGH SENSITIVITY)    EKG: EKG Interpretation Date/Time:  Sunday September 29 2023 16:58:55 EDT Ventricular Rate:  65 PR Interval:  313 QRS Duration:  78 QT Interval:  518 QTC Calculation: 539 R Axis:   14  Text Interpretation: AV PACED RHYTHM No significant change since last tracing Confirmed by Dean Clarity [phone]) on 09/29/2023 5:05:51 PM  Radiology: DG Chest Portable 1 View Result Date: 09/29/2023 CLINICAL DATA:  Shortness of breath. EXAM: PORTABLE CHEST 1 VIEW COMPARISON:  Aug 09, 2023 FINDINGS: There is stable dual lead AICD positioning. The cardiac silhouette is mildly enlarged and unchanged in size. Low lung volumes are noted without evidence of acute infiltrate, pleural effusion or pneumothorax. Multilevel degenerative changes are seen throughout the thoracic spine.  IMPRESSION: Low lung volumes without evidence of acute or active cardiopulmonary disease. Electronically Signed   By: Suzen Dials M.D.   On: 09/29/2023 17:39     Procedures   Medications Ordered in the ED  ipratropium-albuterol  (DUONEB) 0.5-2.5 (3) MG/3ML nebulizer solution 3 mL (3 mLs Nebulization Given 09/29/23 1745)  methylPREDNISolone  sodium succinate (SOLU-MEDROL ) 125 mg/2 mL injection 60 mg (60 mg Intravenous Given 09/29/23 1828)  doxycycline  (VIBRA -TABS) tablet 100 mg (100 mg Oral Given 09/29/23 1828)  Medical Decision Making Amount and/or Complexity of Data Reviewed Labs: ordered.  Risk Prescription drug management.   This patient presents to the ED for concern of sob, this involves an extensive number of treatment options, and is a complaint that carries with it a high risk of complications and morbidity.  The differential diagnosis includes covid/flu/rsv, chf, cardiac etiology, pna, bronchitis, asthma exac   Co morbidities that complicate the patient evaluation   hld, presbycusis, GERD, glaucoma, depression, and asthma   Additional history obtained:  Additional history obtained from epic chart review External records from outside source obtained and reviewed including wife   Lab Tests:  I Ordered, and personally interpreted labs.  The pertinent results include:  cbc nl, bmp nl, trop nl; covid/flu/rsv neg; bnp nl   Imaging Studies ordered:  I ordered imaging studies including cxr  I independently visualized and interpreted imaging which showed  Low lung volumes without evidence of acute or active cardiopulmonary  disease.   I agree with the radiologist interpretation   Cardiac Monitoring:  The patient was maintained on a cardiac monitor.  I personally viewed and interpreted the cardiac monitored which showed an underlying rhythm of: nsr   Medicines ordered and prescription drug management:  I ordered medication  including nebs/doxy/solumedrol  for sx  Reevaluation of the patient after these medicines showed that the patient improved I have reviewed the patients home medicines and have made adjustments as needed   Problem List / ED Course:  Sob:  improved after meds.  Pt did ambulate with sats drop briefly into the upper 80s, but pt recovered quickly and did not feel sob.  Pt feels much better.  He is stable for d/c.  Return if worse.  F/u with pcp.   Reevaluation:  After the interventions noted above, I reevaluated the patient and found that they have :improved   Social Determinants of Health:  Lives at home   Dispostion:  After consideration of the diagnostic results and the patients response to treatment, I feel that the patent would benefit from discharge with outpatient f/u.       Final diagnoses:  Mild intermittent asthma with exacerbation  Bronchitis    ED Discharge Orders          Ordered    doxycycline  (VIBRAMYCIN ) 100 MG capsule  2 times daily        09/29/23 2018    predniSONE  (DELTASONE ) 50 MG tablet  Daily with breakfast        09/29/23 2018               Dean Clarity, MD 09/29/23 2020

## 2023-09-29 NOTE — Progress Notes (Signed)
 Terry Love     MRN: [id number]      DOB: 04-Nov-1935  Chief Complaint  Patient presents with   Medical Management of Chronic Issues    6 week follow up    Weakness    Pt states he is still experiencing weakness and low energy      HPI Terry Love is here for follow up and re-evaluation of chronic medical conditions, medication management and review of any available recent lab and radiology data.  Preventive health is updated, specifically  Cancer screening and Immunization.   Questions or concerns regarding consultations or procedures which the PT has had in the interim are  addressed. The PT denies any adverse reactions to current medications since the last visit.  There are no new concerns.   specific complaints as above,unchanged and chronic  ROS Denies recent fever or chills. Denies sinus pressure, nasal congestion, ear pain or sore throat. Denies chest congestion, productive cough or wheezing. Denies chest pains, palpitations and leg swelling Denies abdominal pain, nausea, vomiting,diarrhea or constipation.   Denies dysuria, frequency, hesitancy or incontinence. C/o mild  joint pain, swelling and limitation in mobility. Denies headaches, seizures, numbness, or tingling. c/o uncontrolled depression, anxiety not suicidal or homicidal, [facility] Denies skin break down or rash.   PE  BP 100/60   Pulse 63   Resp 16   Ht 5' 10 (1.778 m)   Wt 205 lb 0.6 oz (93 kg)   SpO2 93%   BMI 29.42 kg/m   Patient alert and oriented and in no cardiopulmonary distress.  HEENT: No facial asymmetry, EOMI,     Neck supple .  Chest: Clear to auscultation bilaterally.  CVS: S1, S2 no murmurs, no S3.Regular rate.  ABD: Soft non tender.   Ext: No edema  MS: Adequate  though reduced ROM spine, shoulders, hips and knees.  Skin: Intact, no ulcerations or rash noted.  Psych: Good eye contact, normal affect. not anxious or depressed appearing.  CNS: CN 2-12 intact, power,   normal throughout.no focal deficits noted.   Assessment & Plan  IGT (impaired glucose tolerance) Patient educated about the importance of limiting  Carbohydrate intake , the need to commit to daily physical activity for a minimum of 30 minutes , and to commit weight loss. C/o fatigur re eval HBA1C The fact that changes in all these areas will reduce or eliminate all together the development of diabetes is stressed.      Latest Ref Rng & Units 08/09/2023    1:40 PM 05/23/2023    9:30 AM 01/07/2023    9:07 AM 11/16/2022   11:44 AM 07/11/2022    9:23 AM  Diabetic Labs  HbA1c 4.8 - 5.6 %   5.5   5.7   Chol 100 - 199 mg/dL  845  845     HDL >60 mg/dL  55  55     Calc LDL 0 - 99 mg/dL  88  86     Triglycerides 0 - 149 mg/dL  54  66     Creatinine 0.61 - 1.24 mg/dL 9.11  9.17  9.17  9.03        09/25/2023   10:41 AM 09/25/2023   10:08 AM 09/16/2023    8:47 AM 08/14/2023    3:29 PM 08/14/2023    2:19 PM 08/09/2023    3:30 PM 08/09/2023   12:43 PM  BP/Weight  Systolic BP 100 88 106 100 112 123 104  Diastolic BP 60 51 65 68 53 76 83  Wt. (Lbs)  205.04 197.4  202    BMI  29.42 kg/m2 28.32 kg/m2  28.98 kg/m2         No data to display            Hyperlipidemia LDL goal <100 Hyperlipidemia:Low fat diet discussed and encouraged.   Lipid Panel  Lab Results  Component Value Date   CHOL 154 05/23/2023   HDL 55 05/23/2023   LDLCALC 88 05/23/2023   TRIG 54 05/23/2023   CHOLHDL 2.8 05/23/2023     Controlled, no change in medication   Fatigue Multifactorial depression and age playin the most part Update Thyroid  function  Worm infestation Being re evakuated by GI  Sinus node dysfunction (HCC) Followed by Cardiology, CV exam unchanged pt at baseline cardiac wise , encouraged to increase exercise / activity  Primary parkinsonism (HCC) Psych med withsrawn with improvement in tremot  Obesity (BMI 30.0-34.9)  Patient re-educated about  the importance of commitment to a  minimum of  150 minutes of exercise per week as able.  The importance of healthy food choices with portion control discussed, as well as eating regularly and within a 12 hour window most days. The need to choose clean , green food 50 to 75% of the time is discussed, as well as to make water  the primary drink and set a goal of 64 ounces water  daily.       09/29/2023    4:42 PM 09/25/2023   10:08 AM 09/16/2023    8:47 AM  Weight /BMI  Weight 200 lb 205 lb 0.6 oz 197 lb 6.4 oz  Height 5' 10 (1.778 m) 5' 10 (1.778 m) 5' 10 (1.778 m)  BMI 28.7 kg/m2 29.42 kg/m2 28.32 kg/m2      Depression, major, single episode, in partial remission (HCC) Not at goal biut has imporoved, doing more activieites that he enjoys, mx per psych

## 2023-09-29 NOTE — Assessment & Plan Note (Signed)
  Patient re-educated about  the importance of commitment to a  minimum of 150 minutes of exercise per week as able.  The importance of healthy food choices with portion control discussed, as well as eating regularly and within a 12 hour window most days. The need to choose clean , green food 50 to 75% of the time is discussed, as well as to make water  the primary drink and set a goal of 64 ounces water  daily.       09/29/2023    4:42 PM 09/25/2023   10:08 AM 09/16/2023    8:47 AM  Weight /BMI  Weight 200 lb 205 lb 0.6 oz 197 lb 6.4 oz  Height 5' 10 (1.778 m) 5' 10 (1.778 m) 5' 10 (1.778 m)  BMI 28.7 kg/m2 29.42 kg/m2 28.32 kg/m2

## 2023-09-29 NOTE — Assessment & Plan Note (Signed)
 Being re evakuated by GI

## 2023-09-29 NOTE — Assessment & Plan Note (Signed)
 Followed by Cardiology, CV exam unchanged pt at baseline cardiac wise , encouraged to increase exercise / activity

## 2023-09-29 NOTE — ED Triage Notes (Signed)
 Pt with c/o SOB for some time.  Had cold sweat yesterday. Had some pressure to mid chest yesterday. + cough NP

## 2023-09-29 NOTE — Assessment & Plan Note (Signed)
 Not at goal biut has imporoved, doing more activieites that he enjoys, mx per psych

## 2023-09-30 ENCOUNTER — Other Ambulatory Visit (HOSPITAL_COMMUNITY)
Admission: RE | Admit: 2023-09-30 | Discharge: 2023-09-30 | Disposition: A | Discharge status: Home / Self Care | Source: Ambulatory Visit | Attending: Internal Medicine | Admitting: Internal Medicine

## 2023-09-30 DIAGNOSIS — D509 Iron deficiency anemia, unspecified: Secondary | ICD-10-CM | POA: Insufficient documentation

## 2023-09-30 LAB — CBC WITH DIFFERENTIAL/PLATELET
Abs Immature Granulocytes: 0.01 10*3/uL (ref 0.00–0.07)
Basophils Absolute: 0 10*3/uL (ref 0.0–0.1)
Basophils Relative: 0 %
Eosinophils Absolute: 0 10*3/uL (ref 0.0–0.5)
Eosinophils Relative: 0 %
HCT: 39.8 % (ref 39.0–52.0)
Hemoglobin: 12.9 g/dL — ABNORMAL LOW (ref 13.0–17.0)
Immature Granulocytes: 0 %
Lymphocytes Relative: 12 %
Lymphs Abs: 0.7 10*3/uL (ref 0.7–4.0)
MCH: 30.7 pg (ref 26.0–34.0)
MCHC: 32.4 g/dL (ref 30.0–36.0)
MCV: 94.8 fL (ref 80.0–100.0)
Monocytes Absolute: 0.7 10*3/uL (ref 0.1–1.0)
Monocytes Relative: 12 %
Neutro Abs: 4.5 10*3/uL (ref 1.7–7.7)
Neutrophils Relative %: 76 %
Platelets: 304 10*3/uL (ref 150–400)
RBC: 4.2 MIL/uL — ABNORMAL LOW (ref 4.22–5.81)
RDW: 12 % (ref 11.5–15.5)
WBC: 5.9 10*3/uL (ref 4.0–10.5)
nRBC: 0 % (ref 0.0–0.2)

## 2023-09-30 LAB — O&P RESULT

## 2023-09-30 LAB — OVA + PARASITE EXAM

## 2023-10-01 ENCOUNTER — Ambulatory Visit: Payer: Self-pay

## 2023-10-01 DIAGNOSIS — R062 Wheezing: Secondary | ICD-10-CM | POA: Diagnosis not present

## 2023-10-01 DIAGNOSIS — R069 Unspecified abnormalities of breathing: Secondary | ICD-10-CM | POA: Diagnosis not present

## 2023-10-01 NOTE — Telephone Encounter (Signed)
 Copied from CRM [id number]. Topic: Clinical - Red Word Triage >> Oct 01, 2023  4:15 PM Terry Love wrote: Red Word that prompted transfer to Nurse Triage: pt still having trouble breathing, had to call ems yesterday and wife was calling to schedule hospital f/u   FYI Only or Action Required?: FYI only for provider.  Patient was last seen in primary care on 09/25/2023 by Antonetta Rollene BRAVO, MD. Called Nurse Triage reporting Shortness of Breath. Symptoms began yesterday. Interventions attempted: Other: Seen in ED yesterday for the same. Symptoms are: gradually improving.  Triage Disposition: See HCP Within 4 Hours (Or PCP Triage)  Seen in ED yesterday, hospital follow up scheduled for 6/26  Patient/caregiver understands and will follow disposition?: Yes     Reason for Disposition  [1] Longstanding difficulty breathing (e.g., CHF, COPD, emphysema) AND [2] WORSE than normal    Seen in ED yesterday, hospital follow up scheduled for 6/26  Answer Assessment - Initial Assessment Questions 1. RESPIRATORY STATUS: Describe your breathing? (e.g., wheezing, shortness of breath, unable to speak, severe coughing)      Difficulty breathing  2. ONSET: When did this breathing problem begin?      Yesterday 3. PATTERN Does the difficult breathing come and go, or has it been constant since it started?      Improving  4. SEVERITY: How bad is your breathing? (e.g., mild, moderate, severe)    - MILD: No SOB at rest, mild SOB with walking, speaks normally in sentences, can lie down, no retractions, pulse < 100.    - MODERATE: SOB at rest, SOB with minimal exertion and prefers to sit, cannot lie down flat, speaks in phrases, mild retractions, audible wheezing, pulse 100-120.    - SEVERE: Very SOB at rest, speaks in single words, struggling to breathe, sitting hunched forward, retractions, pulse > 120      Mild 5. RECURRENT SYMPTOM: Have you had difficulty breathing before? If Yes, ask: When was the last  time? and What happened that time?      Yes 6. CARDIAC HISTORY: Do you have any history of heart disease? (e.g., heart attack, angina, bypass surgery, angioplasty)      Yes 7. LUNG HISTORY: Do you have any history of lung disease?  (e.g., pulmonary embolus, asthma, emphysema)     Yes 9. OTHER SYMPTOMS: Do you have any other symptoms? (e.g., dizziness, runny nose, cough, chest pain, fever)     No 10. O2 SATURATION MONITOR:  Do you use an oxygen  saturation monitor (pulse oximeter) at home? If Yes, ask: What is your reading (oxygen  level) today? What is your usual oxygen  saturation reading? (e.g., 95%)       No  Protocols used: Breathing Difficulty-A-AH

## 2023-10-02 ENCOUNTER — Ambulatory Visit (INDEPENDENT_AMBULATORY_CARE_PROVIDER_SITE_OTHER): Payer: Medicare Other

## 2023-10-02 DIAGNOSIS — I495 Sick sinus syndrome: Secondary | ICD-10-CM

## 2023-10-02 LAB — CUP PACEART REMOTE DEVICE CHECK
Battery Voltage: 55
Date Time Interrogation Session: 20250625081012
Implantable Lead Connection Status: 753985
Implantable Lead Connection Status: 753985
Implantable Lead Implant Date: 20191216
Implantable Lead Implant Date: 20191216
Implantable Lead Location: 753859
Implantable Lead Location: 753860
Implantable Lead Model: 377
Implantable Lead Model: 377
Implantable Lead Serial Number: 80840322
Implantable Lead Serial Number: 80924555
Implantable Pulse Generator Implant Date: 20191216
Pulse Gen Model: 407145
Pulse Gen Serial Number: 69486427

## 2023-10-03 ENCOUNTER — Ambulatory Visit: Payer: Self-pay | Admitting: Gastroenterology

## 2023-10-03 ENCOUNTER — Other Ambulatory Visit: Payer: Self-pay | Admitting: Family Medicine

## 2023-10-03 ENCOUNTER — Telehealth: Payer: Self-pay

## 2023-10-03 ENCOUNTER — Ambulatory Visit (INDEPENDENT_AMBULATORY_CARE_PROVIDER_SITE_OTHER): Payer: Self-pay | Admitting: Family Medicine

## 2023-10-03 ENCOUNTER — Encounter: Payer: Self-pay | Admitting: Family Medicine

## 2023-10-03 VITALS — BP 108/60 | Resp 14 | Ht 70.0 in | Wt 205.0 lb

## 2023-10-03 DIAGNOSIS — Z09 Encounter for follow-up examination after completed treatment for conditions other than malignant neoplasm: Secondary | ICD-10-CM

## 2023-10-03 DIAGNOSIS — J4531 Mild persistent asthma with (acute) exacerbation: Secondary | ICD-10-CM | POA: Diagnosis not present

## 2023-10-03 DIAGNOSIS — J4530 Mild persistent asthma, uncomplicated: Secondary | ICD-10-CM

## 2023-10-03 DIAGNOSIS — B839 Helminthiasis, unspecified: Secondary | ICD-10-CM

## 2023-10-03 MED ORDER — ALBUTEROL SULFATE HFA 108 (90 BASE) MCG/ACT IN AERS: INHALATION_SPRAY | RESPIRATORY_TRACT | 5 refills | Status: AC

## 2023-10-03 MED ORDER — PREDNISONE 10 MG PO TABS: 10.0000 mg | ORAL_TABLET | Freq: Every day | ORAL | 0 refills | Status: DC

## 2023-10-03 MED ORDER — PREDNISONE 20 MG PO TABS: ORAL_TABLET | ORAL | 0 refills | Status: AC

## 2023-10-03 NOTE — Telephone Encounter (Signed)
 Copied from CRM [id number]. Topic: Clinical - Prescription Issue >> Oct 03, 2023 11:30 AM Turkey A wrote: Reason for CRM: Pharmacy called has two different prescriptions for predniSONE  10 MG and 20 MG- pharmacist would like to know which one is correct

## 2023-10-03 NOTE — Telephone Encounter (Signed)
 done

## 2023-10-03 NOTE — Telephone Encounter (Signed)
 Tried calling, closed for lunch, will cb later

## 2023-10-03 NOTE — Patient Instructions (Addendum)
 F/U as before, call if you need me sooner  Please see if pulmonary can provide a sooner appointment at the time of checkout with Dr Darlean  Additional prescriptions of prednisone  20 mg tablets then 10 mg tablets have been prescribed for over the next 16 days.  You need to finish taking the 50 mg tablets that have already been prescribed and then you will start 20 mg tablets and after you complete your 20 mg tablets take as directed   Continue to use Symbicort  every day 2 times a day as directed, this helps to reduce episodes of wheezing.  Avoid going out in the extreme heat even in your shed as this temperature change can trigger wheezing.  I encourage you to reach out to the [facility] to get a breathing machine with the medication at your home for use in the event of severe wheezing.  Continue to use your albuterol  rescue inhaler if needed for wheezing, it is very effective.  I will see if a sooner appointment with Dr. Darlean can be arranged because of your recent flare  asthma flare  Thanks for choosing [facility], we consider it a privelige to serve you.

## 2023-10-06 ENCOUNTER — Encounter: Payer: Self-pay | Admitting: Family Medicine

## 2023-10-06 ENCOUNTER — Ambulatory Visit: Payer: Self-pay | Admitting: Internal Medicine

## 2023-10-06 DIAGNOSIS — J45901 Unspecified asthma with (acute) exacerbation: Secondary | ICD-10-CM | POA: Insufficient documentation

## 2023-10-06 NOTE — Assessment & Plan Note (Signed)
 Improving, will benefit from neb machine at home based on recent symptom severity and response to treatment. Pt will reach out to the [facility] for thisand a sooner appointment with Pulmonary is being requested Prednisone  tapered down to 20 mg , then 10 mg , then stop

## 2023-10-06 NOTE — Progress Notes (Signed)
   CARRIE SCHOONMAKER     MRN: [id number]      DOB: 11-29-1935  Chief Complaint  Patient presents with   Follow-up    Seen in ED on 06/22 for SOB, has improved some. But still having wheezing     HPI Mr. Corvin is here for follow up of recent ED visit  on 6/22, when he presented with asthma flare.Responded to neb treatments and is currently on 50 mg prednisone  with symptom improvement No known trigger  other  Denies recent fever or chills. Denies sinus pressure, nasal congestion, ear pain or sore throat. Denies chest congestion,  productive cough or wheezing. Denies chest pains, palpitations and leg swelling Denies abdominal pain, nausea, vomiting,diarrhea or constipation.   Denies dysuria, frequency, hesitancy or incontinence. Denies joint pain, swelling and limitation in mobility. Denies headaches, seizures, numbness, or tingling. Chronic depression, anxiety or insomnia. Denies skin break down or rash.   PE BP 108/60   Resp 14   Ht 5' 10 (1.778 m)   Wt 205 lb (93 kg)   SpO2 96%   BMI 29.41 kg/m    Patient alert and oriented and in no cardiopulmonary distress.  HEENT: No facial asymmetry, EOMI,     Neck decreased ROM .  Chest: decreased air entry ,  no wheezes.  CVS: S1, S2 no murmurs, no S3.Regular rate.  ABD: Soft non tender.   Ext: No edema  MS: Adequate though reduced  ROM spine, shoulders, hips and knees.  Skin: Intact, no ulcerations or rash noted.  Psych: Good eye contact, normal affect. Memory intact not anxious or depressed appearing.  CNS: CN 2-12 intact, power,  normal throughout.no focal deficits noted.   Assessment & Plan  Acute asthma flare Improving, will benefit from neb machine at home based on recent symptom severity and response to treatment. Pt will reach out to the [facility] for thisand a sooner appointment with Pulmonary is being requested Prednisone  tapered down to 20 mg , then 10 mg , then stop  Encounter for examination following treatment  at hospital Patient in for follow up of recent ED visit Discharge summary, and laboratory and radiology data are reviewed, and any questions or concerns  are discussed. Specific issues requiring follow up are specifically addressed.   Worm infestation Treated adequately , 3 stool tests negative

## 2023-10-06 NOTE — Assessment & Plan Note (Signed)
Patient in for follow up of recent ED visit. Discharge summary, and laboratory and radiology data are reviewed, and any questions or concerns  are discussed. Specific issues requiring follow up are specifically addressed.  

## 2023-10-06 NOTE — Assessment & Plan Note (Signed)
 Treated adequately , 3 stool tests negative

## 2023-10-07 ENCOUNTER — Ambulatory Visit: Payer: Self-pay | Admitting: Internal Medicine

## 2023-10-16 ENCOUNTER — Encounter: Payer: Self-pay | Admitting: Internal Medicine

## 2023-10-16 ENCOUNTER — Ambulatory Visit: Attending: Internal Medicine | Admitting: Internal Medicine

## 2023-10-16 VITALS — BP 116/72 | HR 65 | Ht 70.0 in | Wt 204.0 lb

## 2023-10-16 DIAGNOSIS — I5032 Chronic diastolic (congestive) heart failure: Secondary | ICD-10-CM | POA: Diagnosis present

## 2023-10-16 NOTE — Progress Notes (Signed)
 HPI Terry Love returns today for followup. He is a pleasant 88 yo man with a h/o HTN and COPD who was found to have chronotropic incompetence and underwent PPM insertion. In the interim, he has still had some dyspnea/fatigue with exertion but still remains active. No PND or orthopnea. No edema. He admits to being sedentary. His sob is improved with lasix  20 mg daily. He was in the ED with sob and treated for both COPD and CHF.   No Known Allergies   Current Outpatient Medications  Medication Sig Dispense Refill   albuterol  (VENTOLIN  HFA) 108 (90 Base) MCG/ACT inhaler INHALE 2 PUFFS INTO THE LUNGS EVERY 4 HOURS AS NEEDED FOR WHEEZING OR SHORTNESS OF BREATH. 18 each 5   aspirin  81 MG EC tablet Take 81 mg by mouth at bedtime.     atorvastatin  (LIPITOR) 40 MG tablet TAKE 1 TABLET AT BEDTIME 90 tablet 3   budesonide -formoterol  (SYMBICORT ) 160-4.5 MCG/ACT inhaler Take 2 puffs first thing in am and then another 2 puffs about 12 hours later. 1 each 6   calcium -vitamin D  (OSCAL WITH D) 500-200 MG-UNIT tablet Take 1 tablet by mouth 2 (two) times daily. 150 tablet 1   carboxymethylcellulose (REFRESH PLUS) 0.5 % SOLN Place 1 drop into both eyes 2 (two) times daily.     dorzolamide-timolol (COSOPT) 22.3-6.8 MG/ML ophthalmic solution Place 1 drop into both eyes 2 (two) times daily.     doxycycline  (VIBRAMYCIN ) 100 MG capsule Take 1 capsule (100 mg total) by mouth 2 (two) times daily. 14 capsule 0   famotidine  (PEPCID ) 20 MG tablet TAKE 1 TABLET BY MOUTH EVERY DAY AFTER DINNER 90 tablet 3   fluticasone (FLONASE) 50 MCG/ACT nasal spray Place 1 spray into both nostrils daily.     furosemide  (LASIX ) 20 MG tablet Take half tablet by mouth once daily     latanoprost (XALATAN) 0.005 % ophthalmic solution Place 1 drop into both eyes at bedtime.     Multiple Vitamin (MULTIVITAMIN WITH MINERALS) TABS tablet Take 1 tablet by mouth at bedtime.     omeprazole  (PRILOSEC) 40 MG capsule Take 1 capsule (40 mg total)  by mouth daily. 90 capsule 1   predniSONE  (DELTASONE ) 10 MG tablet Take 1 tablet (10 mg total) by mouth daily with breakfast. 7 tablet 0   predniSONE  (DELTASONE ) 50 MG tablet Take 1 tablet (50 mg total) by mouth daily with breakfast. 5 tablet 0   tamsulosin  (FLOMAX ) 0.4 MG CAPS capsule TAKE 1 CAPSULE BY MOUTH EVERY DAY 90 capsule 3   venlafaxine  XR (EFFEXOR -XR) 150 MG 24 hr capsule Take 1 capsule by mouth every morning.     vitamin C (ASCORBIC ACID) 500 MG tablet Take 500 mg by mouth at bedtime.      Vitamin D , Ergocalciferol , (DRISDOL ) 1.25 MG (50000 UNIT) CAPS capsule Take 1 capsule (50,000 Units total) by mouth every Monday. In the morning 12 capsule 1   No current facility-administered medications for this visit.     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   Past Surgical History:  Procedure Laterality Date   carpal tunnel  release right  2006   CATARACT EXTRACTION, BILATERAL     COLONOSCOPY     COLONOSCOPY  01/25/2011   Dr. Rehman:Examination performed to cecum Pan colonic diverticulosis/2 small polyps ablated via cold biopsy from transverse colon/External hemorrhoids, benign polyps   COLONOSCOPY N/A 06/29/2013   Procedure: COLONOSCOPY;  Surgeon: Lamar CHRISTELLA Hollingshead, MD;  Location: [facility];  Service: Endoscopy;  Laterality: N/A;  10:45   EYE SURGERY     left eye cataracts    PACEMAKER IMPLANT N/A 03/24/2018   Procedure: PACEMAKER IMPLANT;  Surgeon: Waddell Danelle ORN, MD;  Location: [facility];  Service: Cardiovascular;  Laterality: N/A;     Family History  Problem Relation Age of  Onset   Colon cancer Mother        diagnosed at age 36   Aneurysm Father    Aneurysm Brother    Stroke Brother      Social History   Socioeconomic History   Marital status: Married    Spouse name: Melynda    Number of children: 0   Years of education: 12   Highest education level: 12th grade  Occupational History   Occupation: retired    Comment: [employer];  Tobacco Use   Smoking status: Never   Smokeless tobacco: Former    Types: Chew    Quit date: 03/13/2016  Vaping Use   Vaping status: Never Used  Substance and Sexual Activity   Alcohol use: No    Alcohol/week: 0.0 standard drinks of alcohol   Drug use: No   Sexual activity: Not Currently  Other Topics Concern   Not on file  Social History Narrative   Right handed    Social Drivers of Health   Financial Resource Strain: Low Risk  (03/19/2022)   Overall Financial Resource Strain (CARDIA)    Difficulty of Paying Living Expenses: Not hard at all  Food Insecurity: No Food Insecurity (03/19/2022)   Hunger Vital Sign    Worried About Running Out of Food in the Last Year: Never true    Ran Out of Food in the Last Year: Never true  Transportation Needs: No Transportation Needs (03/19/2022)   PRAPARE - Administrator, Civil Service (Medical): No    Lack of Transportation (Non-Medical): No  Physical Activity: Insufficiently Active (03/19/2022)   Exercise Vital Sign    Days of Exercise per Week: 3 days    Minutes of Exercise per Session: 20 min  Stress: No Stress Concern Present (03/19/2022)   [employer] of Occupational Health - Occupational Stress Questionnaire    Feeling of Stress : Not at all  Social Connections: Moderately Integrated (03/19/2022)   Social Connection and Isolation Panel    Frequency of Communication with Friends and Family: More than three times a week    Frequency of Social Gatherings with Friends and Family: More than three times a week    Attends Religious Services:  More than 4 times per year    Active Member of [employer] or Organizations: No    Attends Banker Meetings: Never    Marital Status: Married  Catering manager Violence: Not At Risk (03/19/2022)   Humiliation, Afraid, Rape, and Kick questionnaire    Fear of Current or Ex-Partner: No    Emotionally Abused: No    Physically Abused:  No    Sexually Abused: No     Ht 5' 10 (1.778 m)   Wt 204 lb (92.5 kg)   BMI 29.27 kg/m   Physical Exam:  Well appearing NAD HEENT: Unremarkable Neck:  No JVD, no thyromegally Lymphatics:  No adenopathy Back:  No CVA tenderness Lungs:  Clear with no wheezes HEART:  Regular rate rhythm, no murmurs, no rubs, no clicks Abd:  soft, positive bowel sounds, no organomegally, no rebound, no guarding Ext:  2 plus pulses, no edema, no cyanosis, no clubbing Skin:  No rashes no nodules Neuro:  CN II through XII intact, motor grossly intact    DEVICE  Normal device function.  See PaceArt for details.   Assess/Plan: 1. Chronic diastolic heart failure - he will continue his lasix  20 mg daily. I encouraged him to avoid salty foods. His fluid index is good.  2. Sinus node dysfunction - he is s/p PPM insertion. He is asymptomatic. 3. Obesity - he is encouraged to lose weight.  4. PPM - his biotronik DDD PM is working normally. He is pacing 98% of the time in the atrium and almost none in the ventricle due to sinus node dysfunction.    Danelle Arayah Krouse,MD

## 2023-10-16 NOTE — Patient Instructions (Signed)

## 2023-10-19 ENCOUNTER — Other Ambulatory Visit: Payer: Self-pay | Admitting: Cardiology

## 2023-10-30 ENCOUNTER — Ambulatory Visit (INDEPENDENT_AMBULATORY_CARE_PROVIDER_SITE_OTHER): Payer: Medicare Other | Admitting: Urology

## 2023-10-30 ENCOUNTER — Encounter: Payer: Self-pay | Admitting: Urology

## 2023-10-30 VITALS — BP 119/69 | HR 70

## 2023-10-30 DIAGNOSIS — N401 Enlarged prostate with lower urinary tract symptoms: Secondary | ICD-10-CM | POA: Diagnosis not present

## 2023-10-30 DIAGNOSIS — N138 Other obstructive and reflux uropathy: Secondary | ICD-10-CM | POA: Diagnosis not present

## 2023-10-30 DIAGNOSIS — R351 Nocturia: Secondary | ICD-10-CM

## 2023-10-30 LAB — URINALYSIS, ROUTINE W REFLEX MICROSCOPIC
Bilirubin, UA: NEGATIVE
Glucose, UA: NEGATIVE
Ketones, UA: NEGATIVE
Leukocytes,UA: NEGATIVE
Nitrite, UA: NEGATIVE
RBC, UA: NEGATIVE
Specific Gravity, UA: 1.015 (ref 1.005–1.030)
Urobilinogen, Ur: 0.2 mg/dL (ref 0.2–1.0)
pH, UA: 6 (ref 5.0–7.5)

## 2023-10-30 LAB — BLADDER SCAN AMB NON-IMAGING: Scan Result: 0

## 2023-10-30 MED ORDER — TAMSULOSIN HCL 0.4 MG PO CAPS: 0.4000 mg | ORAL_CAPSULE | Freq: Every day | ORAL | 3 refills | Status: AC

## 2023-10-30 NOTE — Patient Instructions (Signed)

## 2023-10-30 NOTE — Progress Notes (Signed)
 10/30/2023 11:54 AM   Terry Love List 1935-05-13 [id number]  Referring provider: Antonetta Rollene BRAVO, MD [address] [place],  [place] [address]  Followup BPH   HPI: [name] is a 88yo here for followup for BPH with nocturia. IPSS 7 QOL 2 on flomax  0.4mg  daily. Urine stream strong. No straining to urinate. Nocturia 0-1x. No urinary incontinence. No hematuria, no dysuria, no UTI since his last visit. He has occasional urinary urgency but no incontinence.    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 Rehman:Examination performed to cecum Pan colonic diverticulosis/2 small polyps ablated via cold biopsy from transverse colon/External hemorrhoids, benign polyps   COLONOSCOPY N/A 06/29/2013   Procedure: COLONOSCOPY;  Surgeon: Terry CHRISTELLA Hollingshead, MD;  Location: [facility];  Service: Endoscopy;  Laterality: N/A;  10:45   EYE SURGERY     left eye cataracts    PACEMAKER IMPLANT N/A 03/24/2018   Procedure: PACEMAKER IMPLANT;  Surgeon: Terry Terry LELON, MD;  Location: [facility];  Service: Cardiovascular;  Laterality: N/A;    Home Medications:  Allergies as of 10/30/2023   No Known Allergies      Medication List        Accurate as of October 30, 2023 11:54 AM. If you have any questions, ask your nurse or doctor.          albuterol  108 (90 Base) MCG/ACT inhaler Commonly known as: Ventolin  HFA INHALE 2 PUFFS INTO THE LUNGS EVERY 4 HOURS AS NEEDED FOR  WHEEZING OR SHORTNESS OF BREATH.   ascorbic acid 500 MG tablet Commonly known as: VITAMIN C Take 500 mg by mouth at bedtime.   aspirin  EC 81 MG tablet Take 81 mg by mouth at bedtime.   atorvastatin  40 MG tablet Commonly known as: LIPITOR TAKE 1 TABLET AT BEDTIME   budesonide -formoterol  160-4.5 MCG/ACT inhaler Commonly known as: Symbicort  Take 2 puffs first thing in am and then another 2 puffs about 12 hours later.   calcium -vitamin D  500-200 MG-UNIT tablet Commonly known as: OSCAL WITH D Take 1 tablet by mouth 2 (two) times daily.   carboxymethylcellulose 0.5 % Soln Commonly known as: REFRESH PLUS Place 1 drop into both eyes 2 (two) times daily.   dorzolamide-timolol 2-0.5 % ophthalmic solution Commonly known as: COSOPT Place 1 drop into both eyes 2 (two) times daily.   doxycycline  100 MG capsule Commonly known as: VIBRAMYCIN  Take 1 capsule (100 mg total) by mouth 2 (two) times daily.   famotidine  20 MG tablet Commonly known as: PEPCID  TAKE 1 TABLET BY MOUTH EVERY DAY AFTER DINNER   fluticasone 50 MCG/ACT nasal spray Commonly known as: FLONASE Place 1 spray into both nostrils daily.   furosemide  20 MG tablet Commonly known as: LASIX  Take 0.5 tablets (10 mg total) by  mouth daily.   latanoprost 0.005 % ophthalmic solution Commonly known as: XALATAN Place 1 drop into both eyes at bedtime.   multivitamin with minerals Tabs tablet Take 1 tablet by mouth at bedtime.   omeprazole  40 MG capsule Commonly known as: PRILOSEC Take 1 capsule (40 mg total) by mouth daily.   predniSONE  50 MG tablet Commonly known as: DELTASONE  Take 1 tablet (50 mg total) by mouth daily with breakfast.   predniSONE  10 MG tablet Commonly known as: DELTASONE  Take 1 tablet (10 mg total) by mouth daily with breakfast.   tamsulosin  0.4 MG Caps capsule Commonly known as: FLOMAX  TAKE 1 CAPSULE BY MOUTH EVERY DAY   venlafaxine  XR 150 MG 24 hr capsule Commonly known as: EFFEXOR -XR Take 1  capsule by mouth every morning.   Vitamin D  (Ergocalciferol ) 1.25 MG (50000 UNIT) Caps capsule Commonly known as: DRISDOL  Take 1 capsule (50,000 Units total) by mouth every Monday. In the morning        Allergies: No Known Allergies  Family History: Family History  Problem Relation Age of Onset   Colon cancer Mother        diagnosed at age 32   Aneurysm Father    Aneurysm Brother    Stroke Brother     Social History:  reports that he has never smoked. He quit smokeless tobacco use about 7 years ago.  His smokeless tobacco use included chew. He reports that he does not drink alcohol and does not use drugs.  ROS: All other review of systems were reviewed and are negative except what is noted above in HPI  Physical Exam: BP 119/69   Pulse 70   Constitutional:  Alert and oriented, No acute distress. HEENT: [place] AT, moist mucus membranes.  Trachea midline, no masses. Cardiovascular: No clubbing, cyanosis, or edema. Respiratory: Normal respiratory effort, no increased work of breathing. GI: Abdomen is soft, nontender, nondistended, no abdominal masses GU: No CVA tenderness.  Lymph: No cervical or inguinal lymphadenopathy. Skin: No rashes, bruises or suspicious lesions. Neurologic: Grossly intact, no focal deficits, moving all 4 extremities. Psychiatric: Normal mood and affect.  Laboratory Data: Lab Results  Component Value Date   WBC 5.9 09/30/2023   HGB 12.9 (L) 09/30/2023   HCT 39.8 09/30/2023   MCV 94.8 09/30/2023   PLT 304 09/30/2023    Lab Results  Component Value Date   CREATININE 0.86 09/29/2023    Lab Results  Component Value Date   PSA <0.1 12/01/2019   PSA 0.2 09/08/2018   PSA 0.1 09/03/2017    No results found for: TESTOSTERONE  Lab Results  Component Value Date   HGBA1C 5.4 09/25/2023    Urinalysis    Component Value Date/Time   APPEARANCEUR Clear 10/26/2022 1221   GLUCOSEU Negative 10/26/2022 1221   BILIRUBINUR Negative 10/26/2022 1221    PROTEINUR Negative 10/26/2022 1221   UROBILINOGEN 0.2 11/24/2012 1350   NITRITE Negative 10/26/2022 1221   LEUKOCYTESUR Negative 10/26/2022 1221    Lab Results  Component Value Date   LABMICR Comment 10/26/2022    Pertinent Imaging:  No results found for this or any previous visit.  No results found for this or any previous visit.  No results found for this or any previous visit.  No results found for this or any previous visit.  No results found for this or any previous visit.  No results found for this or any previous visit.  No results found for this or any previous visit.  No  results found for this or any previous visit.   Assessment & Plan:    1. Benign prostatic hyperplasia with urinary obstruction (Primary) Continue flomax  0.4mg  daily - Urinalysis, Routine w reflex microscopic - BLADDER SCAN AMB NON-IMAGING  2. Nocturia -continue flomax  0.4mg  daily   No follow-ups on file.  Belvie Clara, MD  [facility] [place]

## 2023-10-30 NOTE — Progress Notes (Signed)
 Bladder Scan completed today.  Patient can void prior to the bladder scan. Bladder scan result: 0  Performed By: [facility] LPN

## 2023-11-03 NOTE — Progress Notes (Unsigned)
 Terry Love, male    DOB: 1935-08-21  MRN: [id number]   Brief patient profile:   55 yobm  never smoker/retired in cig factory referred to pulmonary clinic in [place]  05/09/2021 by Dr Theophilus for asthma f/u - onset was  2012   W/u as of 05/09/2021 1st [place]   PFTs 09/05/16 FVC 2.90 (84%), FEV1 2.32 (92%), F/F 80, TLC 82%, DLCO 79% Minimal reduction in diffusion capacity.  FENO 09/05/16- 54  Labs: CBC with diff 09/05/16- WBC 8.2, 5.4% eos, absolute eso count 460 Blood allergy  screen  09/05/16- negative RAST, IgE 150  Cardiac: Echo 03/05/2018 [facility], EF 60 to 65%, grade 1 diastolic dysfunction      History of Present Illness  05/09/2021  Pulmonary/ 1st office eval/ [place] / [place] [facility] re asthma maint on symbicort  160 with poor techique  Chief Complaint  Patient presents with   New Patient (Initial Visit)    Has seen Dr. Theophilus in the past is switching to [place] office for location.   Concerns about breathing   Using symbicort  2 puffs in am and 2 puffs in pm. Wants to discuss different inhaler costs.   Dyspnea:  walking about 50 ft and stop x sev years  Cough: not a problem  Sleep: bed is flat/ on side s am flare but sometimes wheeze per wife  SABA use:  several times a week but never prechallenges  Rec Continue omeprazole  40 mg Take 30-60 min before first meal of the day  Add pepcid  20 mg after supper  Work on inhaler technique:   Ok to try albuterol  15 min before an activity (on alternating days)  that you know would usually make you short of breath GERD diet reviewed, bed blocks rec  Please schedule a follow up office visit in 6 weeks, call sooner if needed with all medications /inhalers/ solutions in hand     12/18/2021  f/u ov/[place] office/Izael Bessinger re: asthma maint on symbicort  160  2bid with suboptimal hfa Chief Complaint  Patient presents with   Follow-up    Feels breathing is getting better    Dyspnea:  easier than it was to do adls but not very  active Cough: none  Sleeping: on back/ props up on  pillows and sleepis fine SABA use: rarely now  02: none  Rec Plan A = Automatic = Always=    Symbicort   160 Take 2 puffs first thing in am and then another 2 puffs about 12 hours later.   Work on inhaler technique:     - remember how golfers take practice swings - use your empty symbicort  for training before each use  Plan B = Backup (to supplement plan A, not to replace it) Only use your albuterol  inhaler as a rescue medication Please schedule a follow up visit in 3 months but call sooner if needed- bring your inhalers     03/16/2022  f/u ov/[place] office/Marna Weniger re: asthma maint on symbicort  160  with moderate hfa effectiveness  Chief Complaint  Patient presents with   Follow-up    Better since last ov  Needs to discuss inhalers. Letter from insurance stating inhaler wont be covered in 2024 and need to change to generic breo or advair   Dyspnea:  across the parking lot slowly  - does better pushing grocery cart  Cough: none  Sleeping: props up with bed blocks  SABA use:  one -  two times  02: none  Rec Generic  Symbicort  160 Take  2 puffs first thing in am and then another 2 puffs about 12 hours later.  Only use your albuterol  as a rescue medication  Also  Ok to try albuterol  15 min before an activity (on alternating days)  that you know would usually make you short of breath and see if it makes any difference and if makes none then don't take albuterol  after activity unless you can't catch your breath as this means it's the resting that helps, not the albuterol .       09/13/2022 6 m   f/u ov/[place] office/Karesa Maultsby re: asthma maint on symbicort  160 2 puffs   Chief Complaint  Patient presents with   Follow-up    He has noticed increased DOE since the last visit. He was winded walking from lobby to exam room today.   Dyspnea:  walk car to house in heat or shower  Cough: none  Sleeping: bed blocks  SABA use: twice a day never at  night  02: none  Rec Ok to try albuterol  15 min before an activity (on alternating days)  that you know would usually make you short of breath  Work on inhaler technique:  Please schedule a follow up office visit in 6 weeks, call sooner if needed with all medications /inhalers/ solutions in hand  Allergy  screen 09/13/22 > Eos 0. 2/ IgE 122    10/31/2022  f/u ov/[place] office/Destiney Sanabia re: asthma  maint on symbicort  160 2bid   Chief Complaint  Patient presents with   DOE   Chronic Asthma  Dyspnea:  long walk thru [place] one prior to OV  slower pace than others  Cough: none  Sleeping: bed  blocks >> no resp cc  SABA use: once or twice daily , never pre or rechallenges  02: none  Rec No change in medications Please schedule a follow up visit in 6 months but call sooner if needed > bring inhalers with you   05/06/2023  f/u ov/[place] office/Damaso Laday re: asthma maint on  did  bring inhalers  Chief Complaint  Patient presents with   Follow-up    Follow up    Dyspnea:  feels saba helps do  Cough: none  Sleeping: bed blocks no   resp cc  SABA use: still variably over using saba with activity   02: none  Rec     11/04/2023  f/u ov/[place] office/Franky Reier re: *** maint on ***  No chief complaint on file.   Dyspnea:  *** Cough: *** Sleeping: ***   resp cc  SABA use: *** 02: ***  Lung cancer screening: ***   No obvious day to day or daytime variability or assoc excess/ purulent sputum or mucus plugs or hemoptysis or cp or chest tightness, subjective wheeze or overt sinus or hb symptoms.    Also denies any obvious fluctuation of symptoms with weather or environmental changes or other aggravating or alleviating factors except as outlined above   No unusual exposure hx or h/o childhood pna/ asthma or knowledge of premature birth.  Current Allergies, Complete Past Medical History, Past Surgical History, Family History, and Social History were reviewed in [employer] record.  ROS  The following are not active complaints unless bolded Hoarseness, sore throat, dysphagia, dental problems, itching, sneezing,  nasal congestion or discharge of excess mucus or purulent secretions, ear ache,   fever, chills, sweats, unintended wt loss or wt gain, classically pleuritic or exertional cp,  orthopnea pnd or arm/hand swelling  or leg swelling,  presyncope, palpitations, abdominal pain, anorexia, nausea, vomiting, diarrhea  or change in bowel habits or change in bladder habits, change in stools or change in urine, dysuria, hematuria,  rash, arthralgias, visual complaints, headache, numbness, weakness or ataxia or problems with walking or coordination,  change in mood or  memory.        No outpatient medications have been marked as taking for the 11/04/23 encounter (Appointment) with Naftula Donahue B, MD.         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

## 2023-11-04 ENCOUNTER — Ambulatory Visit (INDEPENDENT_AMBULATORY_CARE_PROVIDER_SITE_OTHER): Admitting: Internal Medicine

## 2023-11-04 ENCOUNTER — Encounter: Payer: Self-pay | Admitting: Internal Medicine

## 2023-11-04 ENCOUNTER — Telehealth: Payer: Self-pay | Admitting: Internal Medicine

## 2023-11-04 VITALS — BP 93/60 | HR 75 | Ht 70.0 in | Wt 204.0 lb

## 2023-11-04 DIAGNOSIS — R0609 Other forms of dyspnea: Secondary | ICD-10-CM

## 2023-11-04 NOTE — Telephone Encounter (Signed)
 Spoke with spouse regarding the Thursday 01/30/24 11:00 am PFT scheduled at [facility] time is 10:45 am---1st floor registration desk for check in.  Follow up appointment with Dr. Darlean is 01/30/24 at 1:30 pm.  Will mail information to patient and Mrs. [place] voiced her understanding

## 2023-11-04 NOTE — Assessment & Plan Note (Signed)
 Onset ? Around 2012 at wt = 246  -  PFTs 09/05/16 FVC 2.90 (84%), FEV1 2.32 (92%), F/F 80, TLC 82%, DLCO 79% Minimal reduction in diffusion capacity. - 05/09/2021   Walked on Ra  x  one  lap(s) =  approx 150  ft  @ slow pace, stopped due to fatigue and sob with lowest 02 sats 97%  - 06/21/2021   Walked on RA  x  3  lap(s) =  approx 450  ft  @ fast pace, stopped due to end of study min sob with lowest 02 sats 96%  - 12/18/2021  After extensive coaching inhaler device,  effectiveness =    50%  > continue symbicort  160  - 09/13/2022   Walked on RA  x  2  lap(s) =  approx 300  ft  @ slow to mod pace but a bit unsteady/ bent over at waist , stopped with  very mild sob with lowest 02 sats 95%  - 11/04/2023  After extensive coaching inhaler device,  effectiveness =    90%  - 11/04/2023   Walked on RA  x  2  lap(s) =  approx 300  ft  @ slow/cane pace, stopped due to end of sutdy  with lowest 02 sats 96% and no sob    Really not clear what would be driving spells of dyspnea but note he is way over using saba despite adequate hfa technique on symbicort  160   Re SABA :  I spent extra time with pt today reviewing appropriate use of albuterol  for prn use on exertion with the following points: 1) saba is for relief of sob that does not improve by walking a slower pace or resting but rather if the pt does not improve after trying this first. 2) If the pt is convinced, as many are, that saba helps recover from activity faster then it's easy to tell if this is the case by re-challenging : ie stop, take the inhaler, then p 5 minutes try the exact same activity (intensity of workload) that just caused the symptoms and see if they are substantially diminished or not after saba 3) if there is an activity that reproducibly causes the symptoms, try the saba 15 min before the activity on alternate days   If in fact the saba really does help, then fine to continue to use it prn but advised may need to look closer at the maintenance  regimen (symbicort  160 here) being used to achieve better control of airways disease with exertion.   F/ju 3 months with pfts, sooner if needed    Each maintenance medication was reviewed in detail including emphasizing most importantly the difference between maintenance and prns and under what circumstances the prns are to be triggered using an action plan format where appropriate.  Total time for H and P, chart review, counseling, reviewing hfa device(s) , directly observing portions of ambulatory 02 saturation study/ and generating customized AVS unique to this office visit / same day charting = 30 min for pt with  refractory respiratory  symptoms of uncertain etiology

## 2023-11-04 NOTE — Patient Instructions (Addendum)
 Plan A = Automatic = Always=   Symbicort  160 Take 2 puffs first thing in am and then another 2 puffs about 12 hours later.    Work on inhaler technique:  relax and gently blow all the way out then take a nice smooth full deep breath back in, triggering the inhaler at same time you start breathing in.  Hold breath in for at least  5 seconds if you can. Blow out symbicort   thru nose. Rinse and gargle with water  when done.  If mouth or throat bother you at all,  try brushing teeth/gums/tongue with arm and hammer toothpaste/ make a slurry and gargle and spit out.      Plan B = Backup (to supplement plan A, not to replace it) Use your albuterol  inhaler as a rescue medication to be used if you can't catch your breath by resting or slowing your pace  or doing a relaxed purse lip breathing pattern.  - The less you use it, the better it will work when you need it. - Ok to use the inhaler up to 2 puffs  every 4 hours if you must but call for appointment if use goes up over your usual need - Don't leave home without it !!  (think of it like the spare tire or starter fluid for your car)    Also  Ok to try albuterol  15 min before an activity (on alternating days like starter fluid)  that you know would usually make you short of breath and see if it makes any difference and if makes none then don't take albuterol  after activity unless you can't catch your breath as this means it's the resting that helps, not the albuterol .       Please schedule a follow up visit in 3 months but call sooner if needed with PFTs

## 2023-11-05 ENCOUNTER — Ambulatory Visit: Attending: Nurse Practitioner | Admitting: Nurse Practitioner

## 2023-11-05 ENCOUNTER — Encounter: Payer: Self-pay | Admitting: Nurse Practitioner

## 2023-11-05 VITALS — BP 116/70 | HR 73 | Ht 70.0 in | Wt 204.8 lb

## 2023-11-05 DIAGNOSIS — I351 Nonrheumatic aortic (valve) insufficiency: Secondary | ICD-10-CM | POA: Diagnosis not present

## 2023-11-05 DIAGNOSIS — R600 Localized edema: Secondary | ICD-10-CM

## 2023-11-05 DIAGNOSIS — I5032 Chronic diastolic (congestive) heart failure: Secondary | ICD-10-CM | POA: Insufficient documentation

## 2023-11-05 DIAGNOSIS — Z950 Presence of cardiac pacemaker: Secondary | ICD-10-CM | POA: Insufficient documentation

## 2023-11-05 DIAGNOSIS — R0609 Other forms of dyspnea: Secondary | ICD-10-CM | POA: Diagnosis not present

## 2023-11-05 DIAGNOSIS — E785 Hyperlipidemia, unspecified: Secondary | ICD-10-CM | POA: Diagnosis not present

## 2023-11-05 DIAGNOSIS — R5383 Other fatigue: Secondary | ICD-10-CM | POA: Insufficient documentation

## 2023-11-05 MED ORDER — FUROSEMIDE 20 MG PO TABS: 20.0000 mg | ORAL_TABLET | Freq: Every day | ORAL | 0 refills | Status: DC

## 2023-11-05 NOTE — Patient Instructions (Addendum)
 Medication Instructions:  Your physician has recommended you make the following change in your medication:  Please Increase Lasix  to 20 Mg daily   Labwork: In 2-3 weeks at [facility]   Testing/Procedures: None   Follow-Up: Your physician recommends that you schedule a follow-up appointment in: 6 Weeks   Any Other Special Instructions Will Be Listed Below (If Applicable).  If you need a refill on your cardiac medications before your next appointment, please call your pharmacy.

## 2023-11-05 NOTE — Progress Notes (Signed)
 Cardiology Office Note   Date:  11/05/2023 ID:  Love, Terry [birth year], MRN [id number] PCP: Terry Rollene BRAVO, MD  [facility] Providers Cardiologist:  Alvan Carrier, MD Electrophysiologist:  Danelle Birmingham, MD     History of Present Illness Terry Love is a 88 y.o. male with a PMH of chronic diastolic CHF, symptomatic bradycardia, s/p PPM, shortness of breath/dyspnea on exertion, aortic valve regurgitation, and hyperlipidemia, who presents today for scheduled follow-up.  Followed by EP.  Last seen by Dr. Carrier Alvan on January 09, 2023. Was overall doing well at the time.   Here for follow-up.  He notices some dyspnea/fatigue with exertion, continues to be somewhat active.  Believes his dyspnea on exertion seems to be a little worse compared to last office visit.  Most recent labs have been reviewed and are WNL. Denies any chest pain, palpitations, syncope, presyncope, dizziness, orthopnea, PND, swelling or significant weight changes, acute bleeding, or claudication.  ROS: Negative.  See HPI.  Studies Reviewed:  Echo 02/2023: 1. Left ventricular ejection fraction, by estimation, is 55 to 60%. The  left ventricle has normal function. The left ventricle has no regional  wall motion abnormalities. There is moderate left ventricular hypertrophy.  Left ventricular diastolic  parameters are indeterminate.   2. Right ventricular systolic function is normal. The right ventricular  size is normal.   3. The mitral valve is normal in structure. No evidence of mitral valve  regurgitation. No evidence of mitral stenosis.   4. The aortic valve is tricuspid. There is mild calcification of the  aortic valve. There is mild thickening of the aortic valve. Aortic valve  regurgitation is mild. No aortic stenosis is present.   5. The inferior vena cava is normal in size with greater than 50%  respiratory variability, suggesting right atrial pressure of 3 mmHg.   ETT  02/2018: Nondiagnostic exercise treadmill test for ischemia due to blunted heart rate response, however consistent with chronotropic incompetence with patient achieving only 57% of MPHR. Test discontinued due to patient fatigue and shortness of breath. Blood pressure demonstrated a normal response to exercise.   Lexiscan  03/2016: There was no ST segment deviation noted during stress. The study is normal. No ischemia or scar. This is a low risk study. Nuclear stress EF: 70%.        Physical Exam VS:  BP 116/70   Pulse 73   Ht 5' 10 (1.778 m)   Wt 204 lb 12.8 oz (92.9 kg)   SpO2 97%   BMI 29.39 kg/m        Wt Readings from Last 3 Encounters:  11/05/23 204 lb 12.8 oz (92.9 kg)  11/04/23 204 lb (92.5 kg)  10/16/23 204 lb (92.5 kg)    GEN: Well nourished, well developed in no acute distress NECK: No JVD; No carotid bruits CARDIAC: S1/S2, RRR, no murmurs, rubs, gallops RESPIRATORY:  Clear to auscultation without rales, wheezing or rhonchi  ABDOMEN: Soft, non-tender, non-distended EXTREMITIES:  No edema; No deformity   ASSESSMENT AND PLAN  Chronic diastolic CHF Stage C, NYHA class II-III symptoms.  EF 55 to 60%, moderate LVH, indeterminate diastolic parameters, no significant valvular abnormalities, mild AR.  Does not appear volume overloaded. Euvolemic and well compensated on exam, but does admit to worsening DOE, as well as fatigue.  Etiology multifactorial.  Instructed him to take Lasix  20 mg daily and will obtain BMET in 2 weeks.  No other medication changes at this time. Low sodium diet,  fluid restriction <2L, and daily weights encouraged. Educated to contact our office for weight gain of 2 lbs overnight or 5 lbs in one week.  Symptomatic bradycardia, s/p PPM  Denies any issues.  Heart rates well-controlled.  Normal device function seen with most recent remote device check.  Continue follow-up with EP.  Dyspnea on exertion, fatigue Etiology unclear.  Etiology seems to be  multifactorial given his age and possible/most likely deconditioning.   Instructed him to take Lasix  20 mg daily and will obtain BMET in 2 weeks.  No other medication changes at this time. Low salt, heart healthy diet recommended.  Care and ED precautions discussed.  If symptoms are not improved by next office visit, will update echocardiogram.  AR Aortic valve from last echocardiogram was found to be mildly leaky with mild regurgitation.  This should not be causing/source of his current symptoms.   Hyperlipidemia Most recent LDL was 88 from February 2025.  Continue atorvastatin . Heart healthy diet and regular cardiovascular exercise encouraged.     Dispo: Follow-up with MD/APP in 6 weeks or sooner if any changes.  Signed, Almarie Crate, NP

## 2023-11-07 ENCOUNTER — Other Ambulatory Visit: Payer: Self-pay | Admitting: Family Medicine

## 2023-12-13 ENCOUNTER — Ambulatory Visit (INDEPENDENT_AMBULATORY_CARE_PROVIDER_SITE_OTHER)

## 2023-12-13 DIAGNOSIS — Z23 Encounter for immunization: Secondary | ICD-10-CM | POA: Diagnosis not present

## 2023-12-13 NOTE — Progress Notes (Signed)
 Patient is in office today for a nurse visit for Immunization. Patient Injection was given in the  Left deltoid. Patient tolerated injection well.

## 2023-12-17 ENCOUNTER — Other Ambulatory Visit: Payer: Self-pay | Admitting: Family Medicine

## 2023-12-17 ENCOUNTER — Encounter: Payer: Self-pay | Admitting: Nurse Practitioner

## 2023-12-17 ENCOUNTER — Other Ambulatory Visit (HOSPITAL_COMMUNITY)
Admission: RE | Admit: 2023-12-17 | Discharge: 2023-12-17 | Disposition: A | Discharge status: Home / Self Care | Source: Ambulatory Visit | Attending: Nurse Practitioner | Admitting: Nurse Practitioner

## 2023-12-17 ENCOUNTER — Ambulatory Visit: Attending: Nurse Practitioner | Admitting: Nurse Practitioner

## 2023-12-17 VITALS — BP 118/70 | HR 70 | Ht 70.0 in | Wt 204.4 lb

## 2023-12-17 DIAGNOSIS — R0609 Other forms of dyspnea: Secondary | ICD-10-CM | POA: Insufficient documentation

## 2023-12-17 DIAGNOSIS — I5032 Chronic diastolic (congestive) heart failure: Secondary | ICD-10-CM | POA: Insufficient documentation

## 2023-12-17 DIAGNOSIS — Z950 Presence of cardiac pacemaker: Secondary | ICD-10-CM | POA: Diagnosis not present

## 2023-12-17 DIAGNOSIS — R600 Localized edema: Secondary | ICD-10-CM | POA: Diagnosis not present

## 2023-12-17 DIAGNOSIS — E785 Hyperlipidemia, unspecified: Secondary | ICD-10-CM | POA: Diagnosis present

## 2023-12-17 DIAGNOSIS — I351 Nonrheumatic aortic (valve) insufficiency: Secondary | ICD-10-CM | POA: Insufficient documentation

## 2023-12-17 DIAGNOSIS — R5383 Other fatigue: Secondary | ICD-10-CM | POA: Insufficient documentation

## 2023-12-17 LAB — BASIC METABOLIC PANEL WITH GFR
Anion gap: 9 (ref 5–15)
BUN: 16 mg/dL (ref 8–23)
CO2: 26 mmol/L (ref 22–32)
Calcium: 9.6 mg/dL (ref 8.9–10.3)
Chloride: 105 mmol/L (ref 98–111)
Creatinine, Ser: 0.78 mg/dL (ref 0.61–1.24)
GFR, Estimated: >60 mL/min (ref >60)
Glucose, Bld: 98 mg/dL (ref 70–99)
Potassium: 3.7 mmol/L (ref 3.5–5.1)
Sodium: 140 mmol/L (ref 135–145)

## 2023-12-17 MED ORDER — FUROSEMIDE 20 MG PO TABS: 20.0000 mg | ORAL_TABLET | Freq: Every day | ORAL | 0 refills | Status: AC | PRN

## 2023-12-17 NOTE — Progress Notes (Signed)
 Cardiology Office Note   Date:  12/17/2023 ID:  Jacub, Waiters [birth year], MRN [id number] PCP: Antonetta Rollene BRAVO, MD  [facility] Providers Cardiologist:  Alvan Carrier, MD Electrophysiologist:  Danelle Birmingham, MD     History of Present Illness DALYN BECKER is a 88 y.o. male with a PMH of chronic diastolic CHF, symptomatic bradycardia, s/p PPM, shortness of breath/dyspnea on exertion, aortic valve regurgitation, and hyperlipidemia, who presents today for scheduled follow-up.  Followed by EP.  Last seen by Dr. Carrier Alvan on January 09, 2023. Was overall doing well at the time.   11/05/2023 - Here for follow-up.  He notices some dyspnea/fatigue with exertion, continues to be somewhat active.  Believes his dyspnea on exertion seems to be a little worse compared to last office visit.  Most recent labs have been reviewed and are WNL. Denies any chest pain, palpitations, syncope, presyncope, dizziness, orthopnea, PND, swelling or significant weight changes, acute bleeding, or claudication.  12/17/2023 - Here for follow-up with his wife. He is doing pretty much the same since last office visit. Continues to note stable DOE and fatigue. Wife says medication was recently adjusted for PTSD, sees a psychiatrist. Denies any chest pain, palpitations, syncope, presyncope, dizziness, orthopnea, PND, swelling or significant weight changes, acute bleeding, or claudication.  ROS: Negative.  See HPI.  Studies Reviewed:  EKG: EKG is not ordered today.   Echo 02/2023: 1. Left ventricular ejection fraction, by estimation, is 55 to 60%. The  left ventricle has normal function. The left ventricle has no regional  wall motion abnormalities. There is moderate left ventricular hypertrophy.  Left ventricular diastolic  parameters are indeterminate.   2. Right ventricular systolic function is normal. The right ventricular  size is normal.   3. The mitral valve is normal in structure. No  evidence of mitral valve  regurgitation. No evidence of mitral stenosis.   4. The aortic valve is tricuspid. There is mild calcification of the  aortic valve. There is mild thickening of the aortic valve. Aortic valve  regurgitation is mild. No aortic stenosis is present.   5. The inferior vena cava is normal in size with greater than 50%  respiratory variability, suggesting right atrial pressure of 3 mmHg.   ETT 02/2018: Nondiagnostic exercise treadmill test for ischemia due to blunted heart rate response, however consistent with chronotropic incompetence with patient achieving only 57% of MPHR. Test discontinued due to patient fatigue and shortness of breath. Blood pressure demonstrated a normal response to exercise.   Lexiscan  03/2016: There was no ST segment deviation noted during stress. The study is normal. No ischemia or scar. This is a low risk study. Nuclear stress EF: 70%.        Physical Exam VS:  BP 118/70   Pulse 70   Ht 5' 10 (1.778 m)   Wt 204 lb 6.4 oz (92.7 kg)   SpO2 97%   BMI 29.33 kg/m        Wt Readings from Last 3 Encounters:  12/17/23 204 lb 6.4 oz (92.7 kg)  11/05/23 204 lb 12.8 oz (92.9 kg)  11/04/23 204 lb (92.5 kg)    GEN: Well nourished, well developed in no acute distress NECK: No JVD; No carotid bruits CARDIAC: S1/S2, RRR, no murmurs, rubs, gallops RESPIRATORY:  Clear to auscultation without rales, wheezing or rhonchi  ABDOMEN: Soft, non-tender, non-distended EXTREMITIES:  No edema; No deformity  PYSCH: Flat affect, withdrawn, appears depressed  ASSESSMENT AND PLAN  Chronic diastolic  CHF Stage C, NYHA class II-III symptoms.  EF 55 to 60%, moderate LVH, indeterminate diastolic parameters, no significant valvular abnormalities, mild AR.  Does not appear volume overloaded. Euvolemic and well compensated on exam, but does admit to worsening DOE, as well as fatigue.  Etiology multifactorial.  Lasix  did not seem to change his symptoms from last  office visit.  Will change Lasix  to 20 mg daily as needed for weight gain, leg swelling, or shortness of breath.  Will obtain BMP at [facility] today. No other medication changes at this time. Low sodium diet, fluid restriction <2L, and daily weights encouraged. Educated to contact our office for weight gain of 2 lbs overnight or 5 lbs in one week.  Symptomatic bradycardia, s/p PPM  Denies any issues.  Heart rates well-controlled.  Normal device function seen with most recent remote device check.  Continue follow-up with EP.  Dyspnea on exertion, fatigue Etiology most likely related to depression and PTSD, medication recently adjusted by psychiatrist.  Last visit and this visit he appears to have flat affect, withdrawn and does admit to feeling depressed most days.  Denies any SI/HI.  Etiology also seems to be likely due to deconditioning as well.  No medication changes at this time. Low salt, heart healthy diet recommended.  Care and ED precautions discussed.  Discussed to follow-up with PCP and psychiatry for further evaluation.  Patient would like to hold off any further workup at this time.  AR Aortic valve from last echocardiogram was found to be mildly leaky with mild regurgitation.  Patient defers further workup at this time and plan to update echocardiogram at future office visit if symptoms do not improve.  This should not be causing/source of his current symptoms.   Hyperlipidemia Most recent LDL was 88 from February 2025.  Continue atorvastatin . Heart healthy diet and regular cardiovascular exercise encouraged.     I spent a total duration of 30 minutes reviewing prior notes, reviewing outside records including  labs,  face-to-face counseling of medical condition, pathophysiology, evaluation, management, and documenting the findings in the note.   Dispo: Follow-up with MD/APP in 6-8 weeks or sooner if any changes.  Signed, Almarie Crate, NP

## 2023-12-17 NOTE — Patient Instructions (Addendum)
 Medication Instructions:  Your physician has recommended you make the following change in your medication:  Please change lasix  to 20 mg daily as needed for weight gain, leg swelling, or shortness of breath.   Labwork: Today at [facility]   Testing/Procedures: None   Follow-Up: Your physician recommends that you schedule a follow-up appointment in: 6-8 weeks   Any Other Special Instructions Will Be Listed Below (If Applicable).  If you need a refill on your cardiac medications before your next appointment, please call your pharmacy.

## 2023-12-19 ENCOUNTER — Ambulatory Visit: Payer: Self-pay | Admitting: Nurse Practitioner

## 2023-12-25 ENCOUNTER — Other Ambulatory Visit: Payer: Self-pay | Admitting: Family Medicine

## 2023-12-25 NOTE — Progress Notes (Signed)
 Remote pacemaker transmission.

## 2024-01-01 ENCOUNTER — Ambulatory Visit: Payer: Medicare Other

## 2024-01-01 ENCOUNTER — Telehealth: Payer: Self-pay | Admitting: Family Medicine

## 2024-01-01 DIAGNOSIS — I5032 Chronic diastolic (congestive) heart failure: Secondary | ICD-10-CM

## 2024-01-01 NOTE — Telephone Encounter (Signed)
 Patient called has questions about the medicine omeprazole  (PRILOSEC) 40 MG capsule  he is taking will it cause loose bowels. She was reading the label instructions and having these symptoms. Call patient at [phone].

## 2024-01-02 LAB — CUP PACEART REMOTE DEVICE CHECK
Battery Voltage: 55
Date Time Interrogation Session: 20250924112855
Implantable Lead Connection Status: 753985
Implantable Lead Connection Status: 753985
Implantable Lead Implant Date: 20191216
Implantable Lead Implant Date: 20191216
Implantable Lead Location: 753859
Implantable Lead Location: 753860
Implantable Lead Model: 377
Implantable Lead Model: 377
Implantable Lead Serial Number: 80840322
Implantable Lead Serial Number: 80924555
Implantable Pulse Generator Implant Date: 20191216
Pulse Gen Model: 407145
Pulse Gen Serial Number: 69486427

## 2024-01-02 NOTE — Telephone Encounter (Signed)
 Spoke to pt wife

## 2024-01-03 NOTE — Progress Notes (Signed)
 Remote PPM Transmission

## 2024-01-05 ENCOUNTER — Ambulatory Visit: Payer: Self-pay | Admitting: Internal Medicine

## 2024-01-16 ENCOUNTER — Other Ambulatory Visit: Payer: Self-pay | Admitting: Family Medicine

## 2024-01-19 DIAGNOSIS — Z23 Encounter for immunization: Secondary | ICD-10-CM | POA: Diagnosis not present

## 2024-01-30 ENCOUNTER — Ambulatory Visit: Payer: Self-pay | Admitting: Internal Medicine

## 2024-01-30 ENCOUNTER — Ambulatory Visit (HOSPITAL_COMMUNITY)
Admission: RE | Admit: 2024-01-30 | Discharge: 2024-01-30 | Disposition: A | Discharge status: Home / Self Care | Source: Ambulatory Visit | Attending: Internal Medicine | Admitting: Internal Medicine

## 2024-01-30 ENCOUNTER — Encounter: Payer: Self-pay | Admitting: Internal Medicine

## 2024-01-30 ENCOUNTER — Ambulatory Visit (INDEPENDENT_AMBULATORY_CARE_PROVIDER_SITE_OTHER): Admitting: Internal Medicine

## 2024-01-30 VITALS — BP 109/64 | HR 63 | Ht 70.0 in | Wt 204.8 lb

## 2024-01-30 DIAGNOSIS — R0609 Other forms of dyspnea: Secondary | ICD-10-CM

## 2024-01-30 DIAGNOSIS — J4530 Mild persistent asthma, uncomplicated: Secondary | ICD-10-CM

## 2024-01-30 LAB — PULMONARY FUNCTION TEST
DL/VA % pred: 104 %
DL/VA: 3.95 ml/min/mmHg/L
DLCO unc % pred: 76 %
DLCO unc: 16.72 ml/min/mmHg
FEF 25-75 Post: 2.06 L/s
FEF 25-75 Pre: 1.69 L/s
FEF2575-%Change-Post: 21 %
FEF2575-%Pred-Post: 138 %
FEF2575-%Pred-Pre: 113 %
FEV1-%Change-Post: 4 %
FEV1-%Pred-Post: 81 %
FEV1-%Pred-Pre: 77 %
FEV1-Post: 1.93 L
FEV1-Pre: 1.84 L
FEV1FVC-%Change-Post: -1 %
FEV1FVC-%Pred-Pre: 113 %
FEV6-%Change-Post: 6 %
FEV6-%Pred-Post: 77 %
FEV6-%Pred-Pre: 72 %
FEV6-Post: 2.46 L
FEV6-Pre: 2.31 L
FEV6FVC-%Change-Post: 0 %
FEV6FVC-%Pred-Post: 108 %
FEV6FVC-%Pred-Pre: 108 %
FVC-%Change-Post: 6 %
FVC-%Pred-Post: 71 %
FVC-%Pred-Pre: 67 %
FVC-Post: 2.47 L
FVC-Pre: 2.31 L
Post FEV1/FVC ratio: 78 %
Post FEV6/FVC ratio: 100 %
Pre FEV1/FVC ratio: 80 %
Pre FEV6/FVC Ratio: 100 %
RV % pred: 96 %
RV: 2.57 L
TLC % pred: 77 %
TLC: 5.18 L

## 2024-01-30 MED ORDER — ALBUTEROL SULFATE (2.5 MG/3ML) 0.083% IN NEBU
2.5000 mg | INHALATION_SOLUTION | Freq: Once | RESPIRATORY_TRACT | Status: AC
  Administered 2024-01-30: 2.5 mg via RESPIRATORY_TRACT

## 2024-01-30 NOTE — Patient Instructions (Signed)
 Symbicort  160 can be used up to 2 puffs every 12hours if needed for coughing/ wheezing or shortness of breath that does not improved within a few minutes of resting OR if need for your albuterol  goes up over present level for any reasons   If you are satisfied with your treatment plan,  let your doctor know and he/she can either refill your medications or you can return here when your prescription runs out.     If in any way you are not 100% satisfied,  please tell us .  If 100% better, tell your friends!  Pulmonary follow up is as needed

## 2024-01-30 NOTE — Assessment & Plan Note (Addendum)
 Onset ? Around 2012 at wt = 246  -  PFTs 09/05/16 FVC 2.90 (84%), FEV1 2.32 (92%), F/F 80, TLC 82%, DLCO 79% Minimal reduction in diffusion capacity. - 05/09/2021   Walked on Ra  x  one  lap(s) =  approx 150  ft  @ slow pace, stopped due to fatigue and sob with lowest 02 sats 97%  - 06/21/2021   Walked on RA  x  3  lap(s) =  approx 450  ft  @ fast pace, stopped due to end of study min sob with lowest 02 sats 96%  - 12/18/2021  After extensive coaching inhaler device,  effectiveness =    50%  > continue symbicort  160  - 09/13/2022   Walked on RA  x  2  lap(s) =  approx 300  ft  @ slow to mod pace but a bit unsteady/ bent over at waist , stopped with  very mild sob with lowest 02 sats 95%  - 11/04/2023  After extensive coaching inhaler device,  effectiveness =    90%  - 11/04/2023   Walked on RA  x  2  lap(s) =  approx 300  ft  @ slow/cane pace, stopped due to end of sutdy  with lowest 02 sats 96% and no sob      - 01/30/2024   Walked on  approx 100  ft  @ slow/cane pace, stopped due to end of study with lowest 02 sats 95% and no sob    - 01/30/2024 pfts  WNL on a typical doe day  so f/u in pulmonary clinic is prn

## 2024-01-30 NOTE — Assessment & Plan Note (Addendum)
 Onset ? Around 2012 at wt = 246  -  PFTs 09/05/16 FVC 2.90 (84%), FEV1 2.32 (92%), F/F 80, TLC 82%, DLCO 79% Minimal reduction in diffusion capacity.  - 12/18/2021    continue symbicort  160 - 03/16/2022   changed to  generic symb 160  - 06/21/2022 spacer ordered > not being used as of 09/13/2022 - 09/13/2022  continue symb 160 but work on technique like a golfer warms up - FENO 09/13/2022  = 18 despite very poor hfa technique  - Allergy  screen 09/13/22  >  Eos 0. 2/  IgE  122 - 05/06/2023  After extensive coaching inhaler device,  effectiveness =    90% with hfa  - 01/30/2024 pfts nl / changed symbicort  160 prn Based on two studies from NEJM  378; 20 p 1865 (2018) and 380 : p2020-30 (2019) in pts with mild asthma it is reasonable to use low dose symbicort  eg 80 2bid prn flare in this setting but I emphasized this was only shown with symbicort  and takes advantage of the rapid onset of action but is not the same as rescue therapy but can be stopped once the acute symptoms have resolved and the need for rescue has been minimized (< 2 x weekly)    Each maintenance medication was reviewed in detail including emphasizing most importantly the difference between maintenance and prns and under what circumstances the prns are to be triggered using an action plan format where appropriate.  Total time for H and P, chart review, counseling, reviewing hfa device(s) , directly observing portions of ambulatory 02 saturation study/ and generating customized AVS unique to this office visit / same day charting = 32 min summary final f/u pulmonary ov

## 2024-01-30 NOTE — Progress Notes (Signed)
 Terry Love, male    DOB: 1935/09/10  MRN: [id number]   Brief patient profile:   72 yobm  never smoker/retired in cig factory referred to pulmonary clinic in [place]  05/09/2021 by Dr Theophilus for asthma f/u - onset was  2012   W/u as of 05/09/2021 [facility]st [place]   PFTs 09/05/16 FVC 2.90 (84%), FEV1 2.32 (92%), F/F 80, TLC 82%, DLCO 79% Minimal reduction in diffusion capacity.  FENO 09/05/16- 54  Labs: CBC with diff 09/05/16- WBC 8.2, 5.4% eos, absolute eso count 460 Blood allergy  screen  09/05/16- negative RAST, IgE 150  Cardiac: Echo 03/05/2018 LVH, EF 60 to 65%, grade 1 diastolic dysfunction      History of Present Illness  05/09/2021  Pulmonary/ 1st office eval/ [facility] / [facility] re asthma maint on symbicort  160 with poor techique  Chief Complaint  Patient presents with   New Patient (Initial Visit)    Has seen Dr. Theophilus in the past is switching to [place] office for location.   Concerns about breathing   Using symbicort  2 puffs in am and 2 puffs in pm. Wants to discuss different inhaler costs.   Dyspnea:  walking about 50 ft and stop x sev years  Cough: not a problem  Sleep: bed is flat/ on side s am flare but sometimes wheeze per wife  SABA use:  several times a week but never prechallenges  Rec Continue omeprazole  40 mg Take 30-60 min before first meal of the day  Add pepcid  20 mg after supper  Work on inhaler technique:   Ok to try albuterol  15 min before an activity (on alternating days)  that you know would usually make you short of breath GERD diet reviewed, bed blocks rec  Please schedule a follow up office visit in 6 weeks, call sooner if needed with all medications /inhalers/ solutions in hand     12/18/2021  f/u ov/[place] [facility]/Terry Love re: asthma maint on symbicort  160  2bid with suboptimal hfa Chief Complaint  Patient presents with   Follow-up    Feels breathing is getting better    Dyspnea:  easier than it was to do adls but not very  active Cough: none  Sleeping: on back/ props up on  pillows and sleepis fine SABA use: rarely now  02: none  Rec Plan A = Automatic = Always=    Symbicort   160 Take 2 puffs first thing in am and then another 2 puffs about 12 hours later.   Work on inhaler technique:     - remember how golfers take practice swings - use your empty symbicort  for training before each use  Plan B = Backup (to supplement plan A, not to replace it) Only use your albuterol  inhaler as a rescue medication Please schedule a follow up visit in 3 months but call sooner if needed- bring your inhalers     03/16/2022  f/u ov/[place] [facility]/Terry Love re: asthma maint on symbicort  160  with moderate hfa effectiveness  Chief Complaint  Patient presents with   Follow-up    Better since last ov  Needs to discuss inhalers. Letter from insurance stating inhaler wont be covered in 2024 and need to change to generic breo or advair   Dyspnea:  across the parking lot slowly  - does better pushing grocery cart  Cough: none  Sleeping: props up with bed blocks  SABA use:  one -  two times  02: none  Rec Generic  Symbicort  160 Take  2 puffs first thing in am and then another 2 puffs about 12 hours later.  Only use your albuterol  as a rescue medication  Also  Ok to try albuterol  15 min before an activity (on alternating days)  that you know would usually make you short of breath and see if it makes any difference and if makes none then don't take albuterol  after activity unless you can't catch your breath as this means it's the resting that helps, not the albuterol .       09/13/2022 6 m   f/u ov/[place] office/Terry Love re: asthma maint on symbicort  160 2 puffs   Chief Complaint  Patient presents with   Follow-up    He has noticed increased DOE since the last visit. He was winded walking from lobby to exam room today.   Dyspnea:  walk car to house in heat or shower  Cough: none  Sleeping: bed blocks  SABA use: twice a day never at  night  02: none  Rec Ok to try albuterol  15 min before an activity (on alternating days)  that you know would usually make you short of breath  Work on inhaler technique:   Allergy  screen 09/13/22 > Eos 0. 2/ IgE 122     05/06/2023  f/u ov/[place] [facility]/Terry Love re: asthma maint on  did  bring inhalers  Chief Complaint  Patient presents with   Follow-up    Follow up    Dyspnea:  feels saba helps do more Cough: none  Sleeping: bed blocks no   resp cc  SABA use: still variably over using saba with activity   02: none  Rec Also  Ok to try albuterol  15 min before an activity (on alternating days)  that you know would usually make you short of breath     11/04/2023  Post ER  ov/[facility]/Terry Love re: asthma maint on symbicort   160 Chief Complaint  Patient presents with   Follow-up   Asthma   Dyspnea:  gets tired easily but very very sedentary / tendency to balance issues better with cart / high walker with brakes  Cough: none  Sleeping: bed blocks and 2 pillow s    resp cc  SABA use: using 4 x daily  02: none   Rec Plan A = Automatic = Always=   Symbicort  160 Take 2 puffs first thing in am and then another 2 puffs about 12 hours later.  Work on inhaler technique:    Plan B = Backup (to supplement plan A, not to replace it) Use your albuterol  inhaler as a rescue medication   Also  Ok to try albuterol  15 min before an activity (on alternating days like starter fluid)  that you know would usually make you short of breath    Please schedule a follow up visit in 3 months but call sooner if needed with PFTs   01/30/2024  f/u ov/[facility]/Terry Love re: asthma maint on symbicort  160 but no   Chief Complaint  Patient presents with   Shortness of Breath    Pft 10/23 F/u  Dyspnea:  nothing speciific  - gets tired in back  Cough: none Sleeping:  bed blocks 2 pillows   resp cc  SABA use: rarely  02: none    No obvious day to day or daytime variability or assoc excess/  purulent sputum or mucus plugs or hemoptysis or cp or chest tightness, subjective wheeze or overt   hb symptoms.    Also denies any obvious  fluctuation of symptoms with weather or environmental changes or other aggravating or alleviating factors except as outlined above   No unusual exposure hx or h/o childhood pna/ asthma or knowledge of premature birth.  Current Allergies, Complete Past Medical History, Past Surgical History, Family History, and Social History were reviewed in [employer] record.  ROS  The following are not active complaints unless bolded Hoarseness, sore throat, dysphagia, dental problems, itching, sneezing,  nasal congestion or discharge of excess mucus or purulent secretions, ear ache,   fever, chills, sweats, unintended wt loss or wt gain, classically pleuritic or exertional cp,  orthopnea pnd or arm/hand swelling  or leg swelling, presyncope, palpitations, abdominal pain, anorexia, nausea, vomiting, diarrhea  or change in bowel habits or change in bladder habits, change in stools or change in urine, dysuria, hematuria,  rash, arthralgias, visual complaints, headache, numbness, weakness or ataxia or problems with walking or coordination,  change in mood or  memory.        Current Meds  Medication Sig   albuterol  (VENTOLIN  HFA) 108 (90 Base) MCG/ACT inhaler INHALE 2 PUFFS INTO THE LUNGS EVERY 4 HOURS AS NEEDED FOR WHEEZING OR SHORTNESS OF BREATH.   aspirin  81 MG EC tablet Take 81 mg by mouth at bedtime.   atorvastatin  (LIPITOR) 40 MG tablet TAKE 1 TABLET AT BEDTIME   budesonide -formoterol  (SYMBICORT ) 160-4.5 MCG/ACT inhaler Take 2 puffs first thing in am and then another 2 puffs about 12 hours later.   calcium -vitamin D  (OSCAL WITH D) 500-200 MG-UNIT tablet Take 1 tablet by mouth 2 (two) times daily.   carboxymethylcellulose (REFRESH PLUS) 0.5 % SOLN Place 1 drop into both eyes 2 (two) times daily.   Cetirizine HCl 10 MG CAPS Take 10 mg by mouth daily.    dorzolamide-timolol (COSOPT) 22.3-6.8 MG/ML ophthalmic solution Place 1 drop into both eyes 2 (two) times daily.   doxycycline  (VIBRAMYCIN ) 100 MG capsule Take 1 capsule (100 mg total) by mouth 2 (two) times daily.   famotidine  (PEPCID ) 20 MG tablet TAKE 1 TABLET BY MOUTH EVERY DAY AFTER DINNER   fluticasone (FLONASE) 50 MCG/ACT nasal spray Place 1 spray into both nostrils daily.   furosemide  (LASIX ) 20 MG tablet Take 1 tablet (20 mg total) by mouth daily as needed (Weight gain, Leg swelling, and shortness of breath).   latanoprost (XALATAN) 0.005 % ophthalmic solution Place 1 drop into both eyes at bedtime.   montelukast  (SINGULAIR ) 10 MG tablet TAKE 1 TABLET BY MOUTH EVERYDAY AT BEDTIME   Multiple Vitamin (MULTIVITAMIN WITH MINERALS) TABS tablet Take 1 tablet by mouth at bedtime.   omeprazole  (PRILOSEC) 40 MG capsule TAKE 1 CAPSULE (40 MG TOTAL) BY MOUTH DAILY.   tamsulosin  (FLOMAX ) 0.4 MG CAPS capsule Take 1 capsule (0.4 mg total) by mouth daily.   venlafaxine  XR (EFFEXOR -XR) 150 MG 24 hr capsule Take 1 capsule by mouth every morning. (Patient taking differently: Take 175 mg by mouth every morning.)   vitamin C (ASCORBIC ACID) 500 MG tablet Take 500 mg by mouth at bedtime.    Vitamin D , Ergocalciferol , (DRISDOL ) 1.25 MG (50000 UNIT) CAPS capsule TAKE 1 CAPSULE (50,000 UNITS TOTAL) BY MOUTH EVERY MONDAY. IN THE MORNING   [DISCONTINUED] predniSONE  (DELTASONE ) 50 MG tablet Take 1 tablet (50 mg total) by mouth daily with breakfast.        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    HEENT : Oropharynx  clear       NECK :  without  apparent JVD/ palpable Nodes/TM    LUNGS: no acc muscle use,  Nl contour chest which is clear to A and P bilaterally without cough on insp or exp maneuvers   CV:  RRR  no s3 or murmur or increase in P2, and  1+ pitting both LE   ABD:  soft and nontender   MS:    ext warm without deformities Or obvious joint restrictions  calf tenderness, cyanosis or clubbing    SKIN: warm and dry without lesions    NEURO:  alert, approp, nl sensorium with  no motor or cerebellar deficits apparent.           Assessment   Assessment & Plan DOE (dyspnea on exertion) Onset ? Around 2012 at wt = 246  -  PFTs 09/05/16 FVC 2.90 (84%), FEV1 2.32 (92%), F/F 80, TLC 82%, DLCO 79% Minimal reduction in diffusion capacity. - 05/09/2021   Walked on Ra  x  one  lap(s) =  approx 150  ft  @ slow pace, stopped due to fatigue and sob with lowest 02 sats 97%  - 06/21/2021   Walked on RA  x  3  lap(s) =  approx 450  ft  @ fast pace, stopped due to end of study min sob with lowest 02 sats 96%  - 12/18/2021  After extensive coaching inhaler device,  effectiveness =    50%  > continue symbicort  160  - 09/13/2022   Walked on RA  x  2  lap(s) =  approx 300  ft  @ slow to mod pace but a bit unsteady/ bent over at waist , stopped with  very mild sob with lowest 02 sats 95%  - 11/04/2023  After extensive coaching inhaler device,  effectiveness =    90%  - 11/04/2023   Walked on RA  x  2  lap(s) =  approx 300  ft  @ slow/cane pace, stopped due to end of sutdy  with lowest 02 sats 96% and no sob      - 01/30/2024   Walked on  approx 100  ft  @ slow/cane  pace, stopped due to end of study with lowest 02 sats 95% and no sob    -  01/30/2024 pfts  WNL on a typical doe day  so f/u in pulmonary clinic is prn    Chronic asthma, mild persistent, uncomplicated Onset ? Around 2012 at wt = 246  -  PFTs 09/05/16 FVC 2.90 (84%), FEV1 2.32 (92%), F/F 80, TLC 82%, DLCO 79% Minimal reduction in diffusion capacity.  - 12/18/2021    continue symbicort  160 - 03/16/2022   changed to  generic symb 160  - 06/21/2022 spacer ordered > not being used as of 09/13/2022 - 09/13/2022  continue symb 160 but work on technique like a golfer warms up - FENO 09/13/2022  = 18 despite very poor hfa technique  - Allergy  screen 09/13/22  >  Eos 0. 2/  IgE  122 - 05/06/2023  After extensive coaching inhaler device,  effectiveness =    90% with hfa  - 01/30/2024 pfts nl / changed symbicort  160 prn Based on two studies from NEJM  378; 20 p 1865 (2018) and 380 : p2020-30 (2019) in pts with mild asthma it is reasonable to use low dose symbicort  eg 80 2bid prn flare in this setting but I emphasized this was only shown with symbicort  and takes advantage of the rapid onset of action but is not the same as rescue therapy but can be stopped once the acute symptoms have resolved and the need for rescue has been minimized (< 2 x weekly)    Each maintenance medication was reviewed in detail including emphasizing most importantly the difference between maintenance and prns and under what circumstances the prns are to be triggered using an action plan format where appropriate.  Total time for H and P, chart review, counseling, reviewing hfa device(s) , directly observing portions of ambulatory 02 saturation study/ and generating customized AVS unique to this office visit / same day charting = 32 min summary final f/u pulmonary ov                  AVS  Patient Instructions  Symbicort  160 can be used up to 2 puffs every 12hours if needed for coughing/ wheezing or shortness of breath that does not  improved within a few minutes of resting OR if need for your albuterol  goes up over present level for any reasons   If you are satisfied with your treatment plan,  let your doctor know and he/she can either refill your medications or you can return here when your prescription runs out.     If in any way you are not 100% satisfied,  please tell us .  If 100% better, tell your friends!  Pulmonary follow up is as needed     Ozell America, MD 01/30/2024

## 2024-02-01 DIAGNOSIS — Z23 Encounter for immunization: Secondary | ICD-10-CM | POA: Diagnosis not present

## 2024-02-01 DIAGNOSIS — R1084 Generalized abdominal pain: Secondary | ICD-10-CM | POA: Diagnosis not present

## 2024-02-07 ENCOUNTER — Ambulatory Visit: Admitting: Family Medicine

## 2024-02-10 ENCOUNTER — Telehealth: Payer: Self-pay

## 2024-02-10 NOTE — Telephone Encounter (Signed)
 Spoke with patient's wife - okay per DPR.  Reports patient has been reporting weakness and palpitations over the past week.  Also says he thinks something might be wrong with his device.   Device function is normal with recent transmission but did find short (22 sec) AF event and increase in PVC burden noted.  VP % also has decreased from 50% in May to 1% currently.    Appointment made with A. Tillery PA-C this Friday 02/14/24 at 820am for evaluation.  Patient/wife given ER precautions should patient's symptoms become severe and our device clinic phone number if any questions in interim.

## 2024-02-10 NOTE — Telephone Encounter (Signed)
 Biotronik alert: Atrial Event EGMs consistent with AF/flutter event. Duration: 22 secs per device; however appears ongoing with possible undersensing of true burden.  New onset, do not see a prior hx in record. Not on an OAC

## 2024-02-13 NOTE — Progress Notes (Unsigned)
  Electrophysiology Office Note:   ID:  Akito, Boomhower 1935-05-29, MRN [id number]  Primary Cardiologist: Alvan Carrier, MD Electrophysiologist: Danelle Birmingham, MD  {Click to update primary MD,subspecialty MD or APP then REFRESH:1}    History of Present Illness:   GEOVANNIE VILAR is a 88 y.o. male with h/o SND s/p PPM, Chronic diastolic CHF, SND, Obesity, Paroxysmal AFL, and PVCs seen today for routine electrophysiology followup.   Since last being seen in our clinic the patient reports doing ***.  he denies chest pain, palpitations, dyspnea, PND, orthopnea, nausea, vomiting, dizziness, syncope, edema, weight gain, or early satiety.   Review of systems complete and found to be negative unless listed in HPI.   EP Information / Studies Reviewed:    EKG is ordered today. Personal review as below.       PPM Interrogation-  reviewed in detail today,  See PACEART report.  Arrhythmia/Device History BIOTRONIK PPM   Physical Exam:   VS:  There were no vitals taken for this visit.   Wt Readings from Last 3 Encounters:  01/30/24 204 lb 12.8 oz (92.9 kg)  12/17/23 204 lb 6.4 oz (92.7 kg)  11/05/23 204 lb 12.8 oz (92.9 kg)     GEN: No acute distress  NECK: No JVD; No carotid bruits CARDIAC: {EPRHYTHM:28826}, no murmurs, rubs, gallops RESPIRATORY:  Clear to auscultation without rales, wheezing or rhonchi  ABDOMEN: Soft, non-tender, non-distended EXTREMITIES:  {EDEMA LEVEL:28147::No} edema; No deformity   ASSESSMENT AND PLAN:    SND s/p Biotronik PPM  Normal PPM function See Pace Art report No changes today  Paroxysmal atrial flutter *** episode *** discussion .ch  Chronic diastolic CHF Volume status ***  {Click here to Review PMH, Prob List, Meds, Allergies, SHx, FHx  :1}   Disposition:   Follow up with {EPPROVIDERS:28135::EP Team} {EPFOLLOW UP:28173}  Signed, Ozell Prentice Passey, PA-C

## 2024-02-14 ENCOUNTER — Encounter: Payer: Self-pay | Admitting: Student

## 2024-02-14 ENCOUNTER — Other Ambulatory Visit (HOSPITAL_COMMUNITY): Payer: Self-pay

## 2024-02-14 ENCOUNTER — Ambulatory Visit: Attending: Student | Admitting: Student

## 2024-02-14 VITALS — BP 124/78 | HR 71 | Ht 70.0 in | Wt 204.0 lb

## 2024-02-14 DIAGNOSIS — I495 Sick sinus syndrome: Secondary | ICD-10-CM | POA: Diagnosis not present

## 2024-02-14 DIAGNOSIS — I5032 Chronic diastolic (congestive) heart failure: Secondary | ICD-10-CM | POA: Diagnosis not present

## 2024-02-14 DIAGNOSIS — I480 Paroxysmal atrial fibrillation: Secondary | ICD-10-CM | POA: Insufficient documentation

## 2024-02-14 DIAGNOSIS — I4892 Unspecified atrial flutter: Secondary | ICD-10-CM

## 2024-02-14 LAB — CUP PACEART INCLINIC DEVICE CHECK
Battery Remaining Percentage: 55 %
Brady Statistic RA Percent Paced: 95 %
Brady Statistic RV Percent Paced: 18 %
Date Time Interrogation Session: 20251107081731
Implantable Lead Connection Status: 753985
Implantable Lead Connection Status: 753985
Implantable Lead Implant Date: 20191216
Implantable Lead Implant Date: 20191216
Implantable Lead Location: 753859
Implantable Lead Location: 753860
Implantable Lead Model: 377
Implantable Lead Model: 377
Implantable Lead Serial Number: 80840322
Implantable Lead Serial Number: 80924555
Implantable Pulse Generator Implant Date: 20191216
Lead Channel Impedance Value: 507 Ohm
Lead Channel Impedance Value: 565 Ohm
Lead Channel Pacing Threshold Amplitude: 0.7 V
Lead Channel Pacing Threshold Amplitude: 0.8 V
Lead Channel Pacing Threshold Pulse Width: 0.4 ms
Lead Channel Pacing Threshold Pulse Width: 0.5 ms
Lead Channel Setting Pacing Amplitude: 2.2 V
Lead Channel Setting Pacing Amplitude: 2.6 V
Lead Channel Setting Pacing Pulse Width: 0.5 ms
Pulse Gen Model: 407145
Pulse Gen Serial Number: 69486427

## 2024-02-14 MED ORDER — APIXABAN 5 MG PO TABS
5.0000 mg | ORAL_TABLET | Freq: Two times a day (BID) | ORAL | 6 refills | Status: DC
  Filled 2024-02-14: qty 60, 30d supply, fill #0

## 2024-02-14 NOTE — Patient Instructions (Signed)
 Medication Instructions:  STOP ASA START Eliquis 5 mg twice daily *If you need a refill on your cardiac medications before your next appointment, please call your pharmacy*  Lab Work: No labwork ordered today. If you have labs (blood work) drawn today and your tests are completely normal, you will receive your results only by: MyChart Message (if you have MyChart) OR A paper copy in the mail If you have any lab test that is abnormal or we need to change your treatment, we will call you to review the results.  Testing/Procedures: No testing ordered today  Follow-Up: At [facility], you and your health needs are our priority.  As part of our continuing mission to provide you with exceptional heart care, our providers are all part of one team.  This team includes your primary Cardiologist (physician) and Advanced Practice Providers or APPs (Physician Assistants and Nurse Practitioners) who all work together to provide you with the care you need, when you need it.  Your next appointment:   1 month(s)  Provider:   Ozell Jodie Passey, PA-C  We recommend signing up for the patient portal called MyChart.  Sign up information is provided on this After Visit Summary.  MyChart is used to connect with patients for Virtual Visits (Telemedicine).  Patients are able to view lab/test results, encounter notes, upcoming appointments, etc.  Non-urgent messages can be sent to your provider as well.   To learn more about what you can do with MyChart, go to [email or web address].

## 2024-02-16 ENCOUNTER — Ambulatory Visit: Payer: Self-pay | Admitting: Student in an Organized Health Care Education/Training Program

## 2024-02-17 ENCOUNTER — Ambulatory Visit: Admitting: Nurse Practitioner

## 2024-02-24 ENCOUNTER — Other Ambulatory Visit: Payer: Self-pay | Admitting: Family Medicine

## 2024-02-24 ENCOUNTER — Other Ambulatory Visit: Payer: Self-pay

## 2024-02-24 ENCOUNTER — Encounter (HOSPITAL_COMMUNITY): Payer: Self-pay

## 2024-02-24 ENCOUNTER — Emergency Department (HOSPITAL_COMMUNITY)
Admission: EM | Admit: 2024-02-24 | Discharge: 2024-02-24 | Disposition: A | Discharge status: Home / Self Care | Attending: Emergency Medicine | Admitting: Emergency Medicine

## 2024-02-24 ENCOUNTER — Emergency Department (HOSPITAL_COMMUNITY)

## 2024-02-24 DIAGNOSIS — I6782 Cerebral ischemia: Secondary | ICD-10-CM | POA: Diagnosis not present

## 2024-02-24 DIAGNOSIS — R519 Headache, unspecified: Secondary | ICD-10-CM | POA: Insufficient documentation

## 2024-02-24 DIAGNOSIS — I10 Essential (primary) hypertension: Secondary | ICD-10-CM | POA: Diagnosis not present

## 2024-02-24 DIAGNOSIS — Z7901 Long term (current) use of anticoagulants: Secondary | ICD-10-CM | POA: Insufficient documentation

## 2024-02-24 DIAGNOSIS — J3489 Other specified disorders of nose and nasal sinuses: Secondary | ICD-10-CM | POA: Diagnosis not present

## 2024-02-24 DIAGNOSIS — Z7951 Long term (current) use of inhaled steroids: Secondary | ICD-10-CM | POA: Diagnosis not present

## 2024-02-24 DIAGNOSIS — J45909 Unspecified asthma, uncomplicated: Secondary | ICD-10-CM | POA: Diagnosis not present

## 2024-02-24 LAB — RESP PANEL BY RT-PCR (RSV, FLU A&B, COVID)  RVPGX2
Influenza A by PCR: NEGATIVE
Influenza B by PCR: NEGATIVE
Resp Syncytial Virus by PCR: NEGATIVE
SARS Coronavirus 2 by RT PCR: NEGATIVE

## 2024-02-24 LAB — BASIC METABOLIC PANEL WITH GFR
Anion gap: 9 (ref 5–15)
BUN: 12 mg/dL (ref 8–23)
CO2: 27 mmol/L (ref 22–32)
Calcium: 9.2 mg/dL (ref 8.9–10.3)
Chloride: 106 mmol/L (ref 98–111)
Creatinine, Ser: 0.6 mg/dL — ABNORMAL LOW (ref 0.61–1.24)
GFR, Estimated: >60 mL/min (ref >60)
Glucose, Bld: 89 mg/dL (ref 70–99)
Potassium: 3.9 mmol/L (ref 3.5–5.1)
Sodium: 142 mmol/L (ref 135–145)

## 2024-02-24 LAB — CBC
HCT: 39.4 % (ref 39.0–52.0)
Hemoglobin: 12.7 g/dL — ABNORMAL LOW (ref 13.0–17.0)
MCH: 30.3 pg (ref 26.0–34.0)
MCHC: 32.2 g/dL (ref 30.0–36.0)
MCV: 94 fL (ref 80.0–100.0)
Platelets: 267 K/uL (ref 150–400)
RBC: 4.19 MIL/uL — ABNORMAL LOW (ref 4.22–5.81)
RDW: 12.1 % (ref 11.5–15.5)
WBC: 4.9 K/uL (ref 4.0–10.5)
nRBC: 0 % (ref 0.0–0.2)

## 2024-02-24 MED ORDER — SODIUM CHLORIDE 0.9 % IV BOLUS
500.0000 mL | Freq: Once | INTRAVENOUS | Status: AC
  Administered 2024-02-24: 500 mL via INTRAVENOUS

## 2024-02-24 MED ORDER — METOCLOPRAMIDE HCL 5 MG/ML IJ SOLN
10.0000 mg | Freq: Once | INTRAMUSCULAR | Status: AC
  Administered 2024-02-24: 10 mg via INTRAVENOUS
  Filled 2024-02-24: qty 2

## 2024-02-24 MED ORDER — ACETAMINOPHEN 500 MG PO TABS
1000.0000 mg | ORAL_TABLET | Freq: Once | ORAL | Status: AC
  Administered 2024-02-24: 1000 mg via ORAL
  Filled 2024-02-24: qty 2

## 2024-02-24 MED ORDER — DIPHENHYDRAMINE HCL 50 MG/ML IJ SOLN
12.5000 mg | Freq: Once | INTRAMUSCULAR | Status: AC
  Administered 2024-02-24: 12.5 mg via INTRAVENOUS
  Filled 2024-02-24: qty 1

## 2024-02-24 MED ORDER — KETOROLAC TROMETHAMINE 30 MG/ML IJ SOLN
30.0000 mg | Freq: Once | INTRAMUSCULAR | Status: AC
  Administered 2024-02-24: 30 mg via INTRAVENOUS
  Filled 2024-02-24: qty 1

## 2024-02-24 MED ORDER — ATORVASTATIN CALCIUM 40 MG PO TABS: 40.0000 mg | ORAL_TABLET | Freq: Every day | ORAL | 3 refills | Status: AC

## 2024-02-24 NOTE — ED Provider Notes (Signed)
 [place] EMERGENCY DEPARTMENT AT [facility] Provider Note   CSN: [id number] Arrival date & time: 02/24/24  1503     Patient presents with: Headache   Terry Love is a 88 y.o. male.   Pt is a 88 yo male with pmhx significant for obesity, hld, anxiety, depression, asthma, hearing loss, and glaucoma.  Pt developed a headache yesterday.  His bp was elevated and usually it's good.  He had a runny nose and took coricidin.  He said the pain is worse today, so he called EMS.  He did not take anything for his headache.  He denies any new numbness, weakness, difficulty speaking, or difficulty seeing.  He's had all over fatigue and weakness for a few months.       Prior to Admission medications   Medication Sig Start Date End Date Taking? Authorizing Provider  albuterol  (VENTOLIN  HFA) 108 (90 Base) MCG/ACT inhaler INHALE 2 PUFFS INTO THE LUNGS EVERY 4 HOURS AS NEEDED FOR WHEEZING OR SHORTNESS OF BREATH. 10/03/23   Antonetta Rollene BRAVO, MD  apixaban (ELIQUIS) 5 MG TABS tablet Take 1 tablet (5 mg total) by mouth 2 (two) times daily. 02/14/24   Lesia Ozell Barter, PA-C  atorvastatin  (LIPITOR) 40 MG tablet Take 1 tablet (40 mg total) by mouth at bedtime. 02/24/24   Antonetta Rollene BRAVO, MD  budesonide -formoterol  (SYMBICORT ) 160-4.5 MCG/ACT inhaler Take 2 puffs first thing in am and then another 2 puffs about 12 hours later. 07/22/23   Darlean Ozell NOVAK, MD  calcium -vitamin D  (OSCAL WITH D) 500-200 MG-UNIT tablet Take 1 tablet by mouth 2 (two) times daily. 10/01/16   Antonetta Rollene BRAVO, MD  carboxymethylcellulose (REFRESH PLUS) 0.5 % SOLN Place 1 drop into both eyes 2 (two) times daily. 01/02/21   [provider]  Cetirizine HCl 10 MG CAPS Take 10 mg by mouth daily.    [provider]  dorzolamide-timolol (COSOPT) 22.3-6.8 MG/ML ophthalmic solution Place 1 drop into both eyes 2 (two) times daily. 01/02/21   [provider]  doxycycline  (VIBRAMYCIN ) 100 MG  capsule Take 1 capsule (100 mg total) by mouth 2 (two) times daily. 09/29/23   Dean Clarity, MD  famotidine  (PEPCID ) 20 MG tablet TAKE 1 TABLET BY MOUTH EVERY DAY AFTER DINNER 04/25/23   Wert, Michael B, MD  fluticasone (FLONASE) 50 MCG/ACT nasal spray Place 1 spray into both nostrils daily.    [provider]  furosemide  (LASIX ) 20 MG tablet Take 1 tablet (20 mg total) by mouth daily as needed (Weight gain, Leg swelling, and shortness of breath). 12/17/23   Miriam Norris, NP  latanoprost (XALATAN) 0.005 % ophthalmic solution Place 1 drop into both eyes at bedtime.    [provider]  montelukast  (SINGULAIR ) 10 MG tablet TAKE 1 TABLET BY MOUTH EVERYDAY AT BEDTIME 01/16/24   Antonetta Rollene BRAVO, MD  Multiple Vitamin (MULTIVITAMIN WITH MINERALS) TABS tablet Take 1 tablet by mouth at bedtime.    [provider]  omeprazole  (PRILOSEC) 40 MG capsule TAKE 1 CAPSULE (40 MG TOTAL) BY MOUTH DAILY. 12/17/23   Antonetta Rollene BRAVO, MD  tamsulosin  (FLOMAX ) 0.4 MG CAPS capsule Take 1 capsule (0.4 mg total) by mouth daily. 10/30/23   McKenzie, Belvie CROME, MD  venlafaxine  XR (EFFEXOR -XR) 150 MG 24 hr capsule Take 1 capsule by mouth every morning. 12/07/20   [provider]  vitamin C (ASCORBIC ACID) 500 MG tablet Take 500 mg by mouth at bedtime.     [provider]  Vitamin D , Ergocalciferol , (DRISDOL ) 1.25 MG (50000 UNIT) CAPS capsule TAKE 1 CAPSULE (50,000 UNITS TOTAL) BY MOUTH EVERY MONDAY. IN THE MORNING 12/30/23   Tobie Suzzane [name], MD    Allergies: Patient has no known allergies.    Review of Systems  Neurological:  Positive for headaches.  All other systems reviewed and are negative.   Updated Vital Signs BP (!) 164/92 (BP Location: Right Arm)   Pulse 65   Temp 97.9 F (36.6 C) (Oral)   Resp 18   Ht 5' 10 (1.778 m)   Wt 93.4 kg   SpO2 98%   BMI 29.56 kg/m   Physical Exam Vitals and nursing note reviewed.  Constitutional:      Appearance: He is  well-developed. He is obese.  HENT:     Head: Normocephalic and atraumatic.     Mouth/Throat:     Mouth: Mucous membranes are moist.     Pharynx: Oropharynx is clear.  Eyes:     Extraocular Movements: Extraocular movements intact.     Pupils: Pupils are equal, round, and reactive to light.  Cardiovascular:     Rate and Rhythm: Normal rate and regular rhythm.     Heart sounds: Normal heart sounds.  Pulmonary:     Effort: Pulmonary effort is normal.     Breath sounds: Normal breath sounds.  Abdominal:     General: Bowel sounds are normal.     Palpations: Abdomen is soft.  Musculoskeletal:        General: Normal range of motion.     Cervical back: Normal range of motion and neck supple.  Skin:    General: Skin is warm.     Capillary Refill: Capillary refill takes less than 2 seconds.  Neurological:     Mental Status: He is alert and oriented to person, place, and time.  Psychiatric:        Mood and Affect: Mood normal.        Behavior: Behavior normal.     (all labs ordered are listed, but only abnormal results are displayed) Labs Reviewed  BASIC METABOLIC PANEL WITH GFR - Abnormal; Notable for the following components:      Result Value   Creatinine, Ser 0.60 (*)    All other components within normal limits  CBC - Abnormal; Notable for the following components:   RBC 4.19 (*)    Hemoglobin 12.7 (*)    All other components within normal limits  RESP PANEL BY RT-PCR (RSV, FLU A&B, COVID)  [id number]    EKG: None  Radiology: CT HEAD WO CONTRAST Result Date: 02/24/2024 EXAM: CT HEAD WITHOUT CONTRAST 02/24/2024 04:18:29 PM TECHNIQUE: CT of the head was performed without the administration of intravenous contrast. Automated exposure control, iterative reconstruction, and/or weight based adjustment of the mA/kV was utilized to reduce the radiation dose to as low as reasonably achievable. COMPARISON: MRI head 06/28/2022 and CT head 06/01/2021. CLINICAL HISTORY: Headache, new  onset (Age >= 51y). FINDINGS: BRAIN AND VENTRICLES: No acute hemorrhage. No evidence of acute infarct. No hydrocephalus. No extra-axial collection. No mass effect or midline shift. Moderate chronic microvascular ischemic changes. Mild age related volume loss. Atherosclerosis involving the carotid siphons. ORBITS: No acute abnormality. Bilateral lens replacement. SINUSES: No acute abnormality. SOFT TISSUES AND SKULL: No acute soft tissue abnormality. No skull fracture. IMPRESSION: 1. No acute intracranial abnormality. 2. Moderate chronic microvascular ischemic changes. 3. Mild age-related cerebral volume loss. Electronically signed by: Donnice Mania MD 02/24/2024 04:52 PM EST RP  Workstation: [id number]     Procedures   Medications Ordered in the ED  metoCLOPramide (REGLAN) injection 10 mg (10 mg Intravenous Given 02/24/24 1643)  diphenhydrAMINE  (BENADRYL ) injection 12.5 mg (12.5 mg Intravenous Given 02/24/24 1643)  acetaminophen  (TYLENOL ) tablet 1,000 mg (1,000 mg Oral Given 02/24/24 1642)  sodium chloride  0.9 % bolus 500 mL (0 mLs Intravenous Stopped 02/24/24 1736)  ketorolac (TORADOL) 30 MG/ML injection 30 mg (30 mg Intravenous Given 02/24/24 1823)                                    Medical Decision Making Amount and/or Complexity of Data Reviewed Labs: ordered. Radiology: ordered.  Risk OTC drugs. Prescription drug management.   This patient presents to the ED for concern of headache, this involves an extensive number of treatment options, and is a complaint that carries with it a high risk of complications and morbidity.  The differential diagnosis includes htn, head bleed, sinus, electrolyte abn, dehydration   Co morbidities that complicate the patient evaluation  obesity, hld, anxiety, depression, asthma, hearing loss, and glaucoma   Additional history obtained:  Additional history obtained from epic chart review External records from outside source obtained and reviewed  including EMS report/wife   Lab Tests:  I Ordered, and personally interpreted labs.  The pertinent results include:  cbc with hgb 12.7 (stable); bmp nl; covid/flu/rsv neg   Imaging Studies ordered:  I ordered imaging studies including ct head  I independently visualized and interpreted imaging which showed  No acute intracranial abnormality.  2. Moderate chronic microvascular ischemic changes.  3. Mild age-related cerebral volume loss.   I agree with the radiologist interpretation   Medicines ordered and prescription drug management:  I ordered medication including benadryl , reglan, ivfs, tylenol   for sx  Reevaluation of the patient after these medicines showed that the patient improved I have reviewed the patients home medicines and have made adjustments as needed   Test Considered:  ct   Critical Interventions:  Pain control   Problem List / ED Course:  Headache:  improved after treatment.  CT neg.   BP still elevated, but it is normally normal.  Therefore, I am not going to start him on bp meds.  He has a BP cuff at home.  He is to repeat it in the am after a good night sleep and call his pcp if it remains high.     Reevaluation:  After the interventions noted above, I reevaluated the patient and found that they have :improved   Social Determinants of Health:  Lives at home   Dispostion:  After consideration of the diagnostic results and the patients response to treatment, I feel that the patent would benefit from discharge with outpatient f/u.       Final diagnoses:  Acute nonintractable headache, unspecified headache type    ED Discharge Orders     None          Dean Clarity, MD 02/24/24 [phone]

## 2024-02-24 NOTE — Telephone Encounter (Signed)
 Copied from CRM #[id number]. Topic: Clinical - Medication Refill >> Feb 24, 2024 11:15 AM Delon HERO wrote: Medication: atorvastatin  (LIPITOR) 40 MG tablet    Has the patient contacted their pharmacy? Yes (Agent: If no, request that the patient contact the pharmacy for the refill. If patient does not wish to contact the pharmacy document the reason why and proceed with request.) (Agent: If yes, when and what did the pharmacy advise?)  This is the patient's preferred pharmacy:  [id number] - [place], [place] - [address] AT [facility] [address] [place] [address] Phone: [phone] Fax: [phone]    Is this the correct pharmacy for this prescription? Yes If no, delete pharmacy and type the correct one.   Has the prescription been filled recently? Yes  Is the patient out of the medication? Yes  Has the patient been seen for an appointment in the last year OR does the patient have an upcoming appointment? Yes  Can we respond through MyChart? Yes  Agent: Please be advised that Rx refills may take up to 3 business days. We ask that you follow-up with your pharmacy.

## 2024-02-24 NOTE — ED Triage Notes (Addendum)
 Patient BIB [facility] from home for a headache described as pins sticking him in his head. 171/78 dropped down to 168/86, CBG 99, Pulse 66 96% RA stated it started yesterday, Denise changes in vision.

## 2024-02-25 ENCOUNTER — Encounter: Payer: Self-pay | Admitting: Family Medicine

## 2024-02-25 ENCOUNTER — Ambulatory Visit: Payer: Self-pay

## 2024-02-25 ENCOUNTER — Ambulatory Visit: Admitting: Family Medicine

## 2024-02-25 VITALS — BP 160/96 | HR 77 | Resp 16 | Ht 70.0 in | Wt 199.0 lb

## 2024-02-25 DIAGNOSIS — L299 Pruritus, unspecified: Secondary | ICD-10-CM | POA: Diagnosis not present

## 2024-02-25 DIAGNOSIS — R195 Other fecal abnormalities: Secondary | ICD-10-CM

## 2024-02-25 DIAGNOSIS — Z09 Encounter for follow-up examination after completed treatment for conditions other than malignant neoplasm: Secondary | ICD-10-CM | POA: Diagnosis not present

## 2024-02-25 DIAGNOSIS — R030 Elevated blood-pressure reading, without diagnosis of hypertension: Secondary | ICD-10-CM | POA: Diagnosis not present

## 2024-02-25 MED ORDER — AMLODIPINE BESYLATE 2.5 MG PO TABS: 2.5000 mg | ORAL_TABLET | Freq: Every day | ORAL | 1 refills | Status: DC

## 2024-02-25 MED ORDER — HYDROXYZINE HCL 10 MG PO TABS: ORAL_TABLET | ORAL | 0 refills | Status: DC

## 2024-02-25 NOTE — Telephone Encounter (Signed)
 FYI Only or Action Required?: Action required by provider: request for appointment and update on patient condition.  Patient was last seen in primary care on 10/03/2023 by Antonetta Rollene BRAVO, MD.  Called Nurse Triage reporting Hypertension, Hospitalization Follow-up, and Headache.  Symptoms began several days ago.  Interventions attempted: OTC medications: tylenol  and Other: evaluated in ED yesterday.  Symptoms are: unchanged.  Triage Disposition: See Physician Within 24 Hours  Patient/caregiver understands and will follow disposition?: Yes          Reason for Disposition  Condition / symptoms SAME (not improving)    Pt recently D/C from [facility] with HTN and H/A. Imaging was done and wife endorses that pt was instructed to see PCP today. Triager attempted to schedule, but no access.  Additionally, pt still c/o HTN and H/A. Triager will forward encounter for Dr Antonetta 's office to review and advise. Wife verbalized understanding and is expecting call back from office for next steps. Triager also advised that if pt does not hear back from office, to go to UC/ED for any worsening sx.  Systolic BP >= 180 OR Diastolic >= 110  Answer Assessment - Initial Assessment Questions 1. BLOOD PRESSURE: What is your blood pressure? Did you take at least two measurements 5 minutes apart?     186/97 this AM, then 165/95 @ 1045  2. ONSET: When did you take your blood pressure?     today 3. HOW: How did you take your blood pressure? (e.g., automatic home BP monitor, visiting nurse)     auto 4. HISTORY: Do you have a history of high blood pressure?     denies 5. MEDICINES: Are you taking any medicines for blood pressure? Have you missed any doses recently?     N/a - denies being on BP meds 6. OTHER SYMPTOMS: Do you have any symptoms? (e.g., blurred vision, chest pain, difficulty breathing, headache, weakness)     Wife endorses recently seeing cardiology d/t pacemaker  issue and was recently prescribed Eliquis. 7. PREGNANCY: Is there any chance you are pregnant? When was your last menstrual period?     N/a  Answer Assessment - Initial Assessment Questions 1. MAIN CONCERN OR SYMPTOM:  What is your main concern right now? What question do you have? What's the main symptom you're worried about? (e.g., breathing difficulty, ankle swelling, weight gain.)     Needs HFU 2. ONSET: When did the  sx  start?     Sunday, worsened yesterday 3. BETTER-SAME-WORSE: Are you getting better, staying the same, or getting worse compared to the day you were discharged?     same 4. HOSPITALIZATION: How long were you hospitalized? (e.g., days)     D/C same day 5. DISCHARGE DIAGNOSIS:  What problem or disease were you hospitalized for?     H/A 6. DISCHARGE DATE: What date were you discharged from the hospital?     11 /18 - see chart 7. DISCHARGE DOCTOR: Who is the main doctor taking care of you now?     See chart 8. DISCHARGE APPOINTMENT: Have you scheduled a follow-up discharge appointment with your doctor?     Calling to schedule now 9. DISCHARGE MEDICINES: Did the doctor (or NP/PA) who discharged you order any new medicines for you to use? If yes, have you filled the prescription and started taking the medicine?      denies 10. PAIN: Is there any pain? If Yes, ask: How bad is it?  (Scale 0-10; or none, mild,  moderate, severe)       8/10 Tylenol  11. FEVER: Do you have a fever? If Yes, ask: What is it, how was it measured  and when did it start?       denies 12. OTHER SYMPTOMS: Do you have any other symptoms?       SOB with exertion at baseline  Protocols used: Blood Pressure - High-A-AH, Post-Hospitalization Follow-up Call-A-AH

## 2024-02-25 NOTE — Patient Instructions (Addendum)
 F/U with MD in 2 weeks , re evaluate blood pressure  New for blood pressure is amlodipine 2.5 mg once daily, take at the same time every day, start today please  I HAVE PRESCRIBED MEDICATION FOR ITCHING, HYDROXYZINE 10 MG TAKE ONE TABLET TWICE DAILY AS NEEDED, IT MAY MAKE YOU DROWSY , SO TAKE AT BEDTIME / IN THE EVENING FIRST  TYLENOL  500 MG ONE TABLET TWO TIMES DAILY IF NEEDED FOR HEADACHE  I BELIEVE THAT SLEEP/ REST AND GETTING YOUR BLOOD PRESSURE LOWER WILL HELP WITH THE HEADACHE  STOOL SAMPLES TO BE BROUGHT IN GOR TESTING FOR BACTERIA AND WORM INFESTATION DUE TO 2 WEEK H/O FREQUENT LOOSE STOOL UP TO 4/DAY (pls order the infectious stool panel to include checking for worms he had had that in the past)   Thanks for choosing [facility], we consider it a privelige to serve you.

## 2024-02-25 NOTE — Telephone Encounter (Signed)
 Scheduled appt today at 1:40

## 2024-02-26 DIAGNOSIS — R195 Other fecal abnormalities: Secondary | ICD-10-CM | POA: Diagnosis not present

## 2024-02-29 ENCOUNTER — Encounter: Payer: Self-pay | Admitting: Family Medicine

## 2024-02-29 ENCOUNTER — Ambulatory Visit: Payer: Self-pay | Admitting: Family Medicine

## 2024-02-29 DIAGNOSIS — R195 Other fecal abnormalities: Secondary | ICD-10-CM | POA: Insufficient documentation

## 2024-02-29 LAB — OVA AND PARASITE EXAMINATION

## 2024-02-29 NOTE — Progress Notes (Unsigned)
   Terry Love     MRN: [id number]      DOB: January 26, 1936  Chief Complaint  Patient presents with   Hypertension    Seen in ED yesterday for high bp and headache    itching    Pt complains of facial itching since being in hospital. This it is from one of the medication he was given     HPI Terry Love is here with above concerns. He c/o headache 3 days before going to ED when wife checked his BP it was high, which is unusual for him. Denies any losss of vision, difficulty with speech or new neurologic symptoms.  No itching across forehead and cheek since being in the emergency room, no rash reported, no difficulty speaking.  Complains of 2-week history of frequent brown stool.  No visible bloodor mucus , stool is brown  ROS Denies recent fever or chills. Denies sinus pressure, nasal congestion, ear pain or sore throat. Denies chest congestion, productive cough or wheezing. Denies chest pains, palpitations and leg swelling Denies skin break down or rash.   PE  BP (!) 160/96   Pulse 77   Resp 16   Ht 5' 10 (1.778 m)   Wt 199 lb 0.6 oz (90.3 kg)   SpO2 95%   BMI 28.56 kg/m   Patient alert and oriented and in no cardiopulmonary distress.  HEENT: No facial asymmetry, EOMI,     Neck decreased ROM .  Chest: Clear to auscultation bilaterally.  CVS: S1, S2 no murmurs, no S3.Regular rate.  ABD:non tender.   Ext: No edema  MS: Adequate ROM spine, shoulders, hips and knees.  Skin: Intact, no ulcerations or rash noted.  Psych: Good eye contact, normal affect. Memory loss not anxious or depressed appearing.  CNS: CN 2-12 intact, power,  normal throughout.no focal deficits noted.   Assessment & Plan  Encounter for examination following treatment at hospital Patient in for follow up of recent ED visit. Discharge summary, and laboratory and radiology data are reviewed, and any questions or concerns  are discussed. Specific issues requiring follow up are specifically  addressed.   Elevated blood pressure reading New but symptomatic, start am;lodipine 2.5 mg daily and re evaluate in 2 weeks, ED if symptoms worsen or persist  Itching No visible rash , no difficulty breathing, low dose hydorxyzine short term  Loose stools Has h/o worm infestation, test stool  for worms or bacterial infection

## 2024-03-01 DIAGNOSIS — L299 Pruritus, unspecified: Secondary | ICD-10-CM | POA: Insufficient documentation

## 2024-03-01 DIAGNOSIS — R030 Elevated blood-pressure reading, without diagnosis of hypertension: Secondary | ICD-10-CM | POA: Insufficient documentation

## 2024-03-01 NOTE — Assessment & Plan Note (Signed)
Patient in for follow up of recent ED visit. Discharge summary, and laboratory and radiology data are reviewed, and any questions or concerns  are discussed. Specific issues requiring follow up are specifically addressed.  

## 2024-03-01 NOTE — Assessment & Plan Note (Signed)
 New but symptomatic, start am;lodipine 2.5 mg daily and re evaluate in 2 weeks, ED if symptoms worsen or persist

## 2024-03-01 NOTE — Assessment & Plan Note (Signed)
 Has h/o worm infestation, test stool  for worms or bacterial infection

## 2024-03-01 NOTE — Assessment & Plan Note (Signed)
 No visible rash , no difficulty breathing, low dose hydorxyzine short term

## 2024-03-02 ENCOUNTER — Telehealth: Payer: Self-pay | Admitting: Cardiology

## 2024-03-02 NOTE — Telephone Encounter (Signed)
 I spoke with wife and she reports Terry Love takes Lasix  20 mg daily and has been taking daily for a while now.  Weight today:199.6 lbs  He saw Dr.Simpson on 02/25/24 and was placed on Amlodipine  2.5 mg daily. Wife reports his headaches are improved now.  Wife tells me he has been SOB for years and has told Dr.Wert about it.   Wife is concerned he is off balance sometimes and his legs are swollen and asks if there is anything we can do for him.

## 2024-03-02 NOTE — Telephone Encounter (Signed)
 Pt c/o Shortness Of Breath: STAT if SOB developed within the last 24 hours or pt is noticeably SOB on the phone  1. Are you currently SOB (can you hear that pt is SOB on the phone)?  Not on the phone with patient  2. How long have you been experiencing SOB?  3-4 days per wife   3. Are you SOB when sitting or when up moving around?  Both   4. Are you currently experiencing any other symptoms?  high BP during recent ED visit (170/90), weakness, off-balance. BP was 116/70 this morning

## 2024-03-03 ENCOUNTER — Other Ambulatory Visit: Payer: Self-pay | Admitting: Family Medicine

## 2024-03-04 ENCOUNTER — Encounter: Payer: Self-pay | Admitting: Nurse Practitioner

## 2024-03-04 ENCOUNTER — Ambulatory Visit: Attending: Nurse Practitioner | Admitting: Emergency Medicine

## 2024-03-04 ENCOUNTER — Other Ambulatory Visit
Admission: RE | Admit: 2024-03-04 | Discharge: 2024-03-04 | Disposition: A | Discharge status: Home / Self Care | Attending: Emergency Medicine | Admitting: Emergency Medicine

## 2024-03-04 VITALS — BP 105/60 | HR 64 | Ht 68.0 in | Wt 193.6 lb

## 2024-03-04 DIAGNOSIS — Z79899 Other long term (current) drug therapy: Secondary | ICD-10-CM | POA: Diagnosis not present

## 2024-03-04 DIAGNOSIS — Z950 Presence of cardiac pacemaker: Secondary | ICD-10-CM | POA: Diagnosis present

## 2024-03-04 DIAGNOSIS — I495 Sick sinus syndrome: Secondary | ICD-10-CM | POA: Insufficient documentation

## 2024-03-04 DIAGNOSIS — I480 Paroxysmal atrial fibrillation: Secondary | ICD-10-CM | POA: Diagnosis present

## 2024-03-04 DIAGNOSIS — R195 Other fecal abnormalities: Secondary | ICD-10-CM | POA: Insufficient documentation

## 2024-03-04 LAB — CBC
HCT: 39.1 % (ref 39.0–52.0)
Hemoglobin: 12.8 g/dL — ABNORMAL LOW (ref 13.0–17.0)
MCH: 30 pg (ref 26.0–34.0)
MCHC: 32.7 g/dL (ref 30.0–36.0)
MCV: 91.8 fL (ref 80.0–100.0)
Platelets: 276 K/uL (ref 150–400)
RBC: 4.26 MIL/uL (ref 4.22–5.81)
RDW: 11.9 % (ref 11.5–15.5)
WBC: 5.9 K/uL (ref 4.0–10.5)
nRBC: 0 % (ref 0.0–0.2)

## 2024-03-04 NOTE — Telephone Encounter (Signed)
 Patients wife notified and verbalized understanding.   [place] office has no availability until Jan 2026. Inquired if pt would be willing to go to a different office for PA visit- spouse agreed.   Pt scheduled 11/26 (today) with KYM Meager, NP at 3:10 at the [facility] office. Pt's spouse agreeable with arrangement.

## 2024-03-04 NOTE — Progress Notes (Signed)
 Office Visit    Patient Name: Terry Love Date of Encounter: 03/05/2024  Primary Care Provider:  Antonetta Rollene BRAVO, MD Primary Cardiologist:  Alvan Carrier, MD  Electrophysiologist:  Donnice DELENA Primus, MD   Chief Complaint    88 y.o. male with a history of symptomatic bradycardia status post permanent pacemaker placement in December 2019, hyperlipidemia, HFpEF, mild aortic insufficiency, obesity, PVCs, and paroxysmal atrial fibrillation noted on device interrogation in November 2025 (now on Eliquis ), and chronic DOE, who presents for f/u related to dyspnea.  Past Medical History   Subjective   Past Medical History:  Diagnosis Date   (HFpEF) heart failure with preserved ejection fraction (HCC)    a. 06/2015 Echo: EF 60-65%, GrI DD; b. 02/2018 Echo: EF 60-65%, mild-mod LVH, GrI DD; c. 02/2023 Echo: EF 55-60%, no rwma, mod LVH.   Anxiety    Aortic insufficiency (mild)    a. 06/2015 Echo: Mild AI; b. 02/2018 Echo: Mild AI; c. 02/2023 Echo: EF 55-60%, no rwma, mod LVH, nl RV fxn, mild AI.   Asthma    Chest pain    a. 06/2015 MV: No isch/scar. EF 70%-->low risk;  b. 02/2018 ETT: Nondiagnostic, 57% MPHR - + for chronotropic incompetence.   Colon polyps    Depression    Facial tic    GERD (gastroesophageal reflux disease)    Glaucoma    Hearing loss    Hyperlipidemia    Insomnia secondary to depression with anxiety 03/2015   Obesity    Seasonal allergies    Symptomatic Bradycardia    a. 03/2018 s/p Biotronik DC PPM (ser # [id number]).   Past Surgical History:  Procedure Laterality Date   carpal tunnel  release right  2006   CATARACT EXTRACTION, BILATERAL     COLONOSCOPY     COLONOSCOPY  01/25/2011   Dr. Rehman:Examination performed to cecum Pan colonic diverticulosis/2 small polyps ablated via cold biopsy from transverse colon/External hemorrhoids, benign polyps   COLONOSCOPY N/A 06/29/2013   Procedure: COLONOSCOPY;  Surgeon: Lamar CHRISTELLA Hollingshead, MD;  Location: [facility];  Service: Endoscopy;  Laterality: N/A;  10:45   EYE SURGERY     left eye cataracts    PACEMAKER IMPLANT N/A 03/24/2018   Procedure: PACEMAKER IMPLANT;  Surgeon: Waddell Danelle LELON, MD;  Location: [facility];  Service: Cardiovascular;  Laterality: N/A;    Allergies  No Known Allergies     History of Present Illness      88 y.o. y/o male with a history of symptomatic bradycardia status post permanent pacemaker placement in December 2019, hyperlipidemia, HFpEF, mild aortic insufficiency, obesity, PVCs, paroxysmal atrial fibrillation (noted on device interrogation November 2025 -Eliquis  started) and chronic DOE.  He previously underwent stress testing in March 2017 in the setting of chronic dyspnea, which showed no ischemia or infarct with an EF of 70%.  Echocardiogram at that time showed normal LV function with mild AI.  In the setting of chronic bradycardia and dyspnea, he underwent exercise treadmill testing in November 2019.  The test itself was nondiagnostic because he was unable to achieve target heart rate (reached 57% maximum predicted heart rate) however, was notable for chronotropic incompetence and he was subsequently seen by electrophysiology underwent dual-chamber permanent pacemaker in December 2019.  Most recent echo in November 2024 showed an EF of 55 to 60% without regional wall motion abnormalities, moderate LVH, and mild AI.  Electrophysiology follow-up in November 2025, his device was functioning normally.  On interrogation, he was noted to have a single, short episode of atrial fibrillation associated with palpitations.  In the setting of a CHA2DS2-VASc of 3, decision made to initiate Eliquis  therapy.  He presented 02/24/2024 to the ED w/ a HA, runny nose, CBC/BMP negative for acute abnormalities, neg respiratory panel, negative head CT. Given migraine cocktail and discharged.  Presents today with his wife Lajean, friend Koren, and favorite niece Erminio. Appears  stable from a cardiovascular standpoint. Reports the day he went to the ED he gradually started feeling the onset of a headache, his BP shot up, and that's what prompted them to go in. Reports the headache improved following leaving the ED. States while his pressure was near 200 systolic he was having some chest tightness, but this resolved once BP improved. However, shortly after their visit to the ED, he began having dark, tarry stools, black at times, 4-5 times a day over the last two weeks. States he's lost weight because he hasn't felt like eating. Doesn't have much energy to do the things he normally does. Mild worsening of his chronic DOE. Uses a cane/walker to stabilize himself while walking. The most strenuous activity he can complete would be walking his driveway and back, no problem getting around his house. Reports his BPs usually average in the 120s or lower systolic. He's also noticed more dizziness with positional changes and occasionally with walking.  Denies chest pain, palpitations, near syncope, hematuria, edema.     Objective   Home Medications    Current Outpatient Medications  Medication Sig Dispense Refill   albuterol  (VENTOLIN  HFA) 108 (90 Base) MCG/ACT inhaler INHALE 2 PUFFS INTO THE LUNGS EVERY 4 HOURS AS NEEDED FOR WHEEZING OR SHORTNESS OF BREATH. 18 each 5   amLODipine  (NORVASC ) 2.5 MG tablet Take 1 tablet (2.5 mg total) by mouth daily. 30 tablet 1   apixaban  (ELIQUIS ) 5 MG TABS tablet Take 1 tablet (5 mg total) by mouth 2 (two) times daily. 60 tablet 6   atorvastatin  (LIPITOR) 40 MG tablet Take 1 tablet (40 mg total) by mouth at bedtime. 90 tablet 3   budesonide -formoterol  (SYMBICORT ) 160-4.5 MCG/ACT inhaler Take 2 puffs first thing in am and then another 2 puffs about 12 hours later. 1 each 6   calcium -vitamin D  (OSCAL WITH D) 500-200 MG-UNIT tablet Take 1 tablet by mouth 2 (two) times daily. 150 tablet 1   carboxymethylcellulose (REFRESH PLUS) 0.5 % SOLN Place 1 drop  into both eyes 2 (two) times daily.     Cetirizine HCl 10 MG CAPS Take 10 mg by mouth daily.     dorzolamide-timolol (COSOPT) 22.3-6.8 MG/ML ophthalmic solution Place 1 drop into both eyes 2 (two) times daily.     doxycycline  (VIBRAMYCIN ) 100 MG capsule Take 1 capsule (100 mg total) by mouth 2 (two) times daily. 14 capsule 0   famotidine  (PEPCID ) 20 MG tablet TAKE 1 TABLET BY MOUTH EVERY DAY AFTER DINNER 90 tablet 3   fluticasone (FLONASE) 50 MCG/ACT nasal spray Place 1 spray into both nostrils daily.     furosemide  (LASIX ) 20 MG tablet Take 1 tablet (20 mg total) by mouth daily as needed (Weight gain, Leg swelling, and shortness of breath). 90 tablet 0   hydrOXYzine  (ATARAX ) 10 MG tablet TAKE ONE TABLET BY MOUTH TWO TIMES DAILY AS NEEDED, FOR ITCHING 180 tablet 1   latanoprost (XALATAN) 0.005 % ophthalmic solution Place 1 drop into both eyes at bedtime.     montelukast  (SINGULAIR ) 10 MG tablet  TAKE 1 TABLET BY MOUTH EVERYDAY AT BEDTIME 90 tablet 1   Multiple Vitamin (MULTIVITAMIN WITH MINERALS) TABS tablet Take 1 tablet by mouth at bedtime.     omeprazole  (PRILOSEC) 40 MG capsule TAKE 1 CAPSULE (40 MG TOTAL) BY MOUTH DAILY. 90 capsule 1   tamsulosin  (FLOMAX ) 0.4 MG CAPS capsule Take 1 capsule (0.4 mg total) by mouth daily. 90 capsule 3   venlafaxine  XR (EFFEXOR -XR) 150 MG 24 hr capsule Take 1 capsule by mouth every morning.     vitamin C (ASCORBIC ACID) 500 MG tablet Take 500 mg by mouth at bedtime.      Vitamin D , Ergocalciferol , (DRISDOL ) 1.25 MG (50000 UNIT) CAPS capsule TAKE 1 CAPSULE (50,000 UNITS TOTAL) BY MOUTH EVERY MONDAY. IN THE MORNING 12 capsule 1   No current facility-administered medications for this visit.     Physical Exam    VS:  BP 105/60 (BP Location: Left Arm, Patient Position: Sitting, Cuff Size: Normal)   Pulse 64   Ht 5' 8 (1.727 m)   Wt 193 lb 9.6 oz (87.8 kg)   SpO2 98%   BMI 29.44 kg/m  , BMI Body mass index is 29.44 kg/m.          GEN: Well nourished,  well developed, in no acute distress. HEENT: normal. Neck: Supple, carotid bruits, or masses. Cardiac: RRR, no murmurs, rubs, or gallops.  Respiratory:  Respirations regular and unlabored, clear to auscultation bilaterally. MS: no deformity or atrophy. Skin: warm and dry, no rash. Neuro:  Strength and sensation are intact. Psych: Normal affect.  Accessory Clinical Findings     Lab Results  Component Value Date   WBC 5.9 03/04/2024   HGB 12.8 (L) 03/04/2024   HCT 39.1 03/04/2024   MCV 91.8 03/04/2024   PLT 276 03/04/2024   Lab Results  Component Value Date   CREATININE 0.60 (L) 02/24/2024   BUN 12 02/24/2024   NA 142 02/24/2024   K 3.9 02/24/2024   CL 106 02/24/2024   CO2 27 02/24/2024   Lab Results  Component Value Date   ALT 17 09/25/2023   AST 23 09/25/2023   ALKPHOS 66 09/25/2023   BILITOT 0.4 09/25/2023   Lab Results  Component Value Date   CHOL 154 05/23/2023   HDL 55 05/23/2023   LDLCALC 88 05/23/2023   TRIG 54 05/23/2023   CHOLHDL 2.8 05/23/2023    Lab Results  Component Value Date   HGBA1C 5.4 09/25/2023   Lab Results  Component Value Date   TSH 1.330 09/25/2023       Assessment & Plan    1.  Multiple episodes of dark, tarry, black stools: Started on Eliquis  on 02/14/2024 2/2 atrial fibrillation Began having multiple episodes of dark, tarry stools since 11/18 Seen by his PCP on 11/18 reporting brown stools. Testing for worms/bacterial infection performed, negative.  Reports 4-5 episodes of dark, tarry stools per day since then, occasionally black with decreased appetite, has lost 10 pounds in 2 weeks. Suspect all symptoms could be 2/2 GI bleed related to eliquis  use. Has a previous history of chronotropic incompetence s/p ppm insertion, unsure whether he could appropriately mount a compensatory tachycardia if GI bleed present.  Stop eliquis  until we can rule this out.  Will order stat CBC to rule out need for emergency services.  If negative,  will recommend urgent f/u w/ PCP to r/o occult bleeding.  Once occult bleeding has been ruled out, could consider restarting eliquis  and further work  up to rule out cardiac etiology at that time.  3. Sinus node dysfunction s/p PPM, PAF Recent pacemaker check on 02/14/2024 showed singular brief episode of PAF, stated on eliquis .  Managed by EP  Miriam FORBES Shams, NP 03/05/2024, 3:43 PM

## 2024-03-04 NOTE — Patient Instructions (Signed)
 Medication Instructions:   Please hold Eliquis   Take Lasix  daily only as needed   *If you need a refill on your cardiac medications before your next appointment, please call your pharmacy*  Lab Work: Your provider would like for you to have following labs drawn today CBC.    If you have labs (blood work) drawn today and your tests are completely normal, you will receive your results only by: MyChart Message (if you have MyChart) OR A paper copy in the mail If you have any lab test that is abnormal or we need to change your treatment, we will call you to review the results.  Testing/Procedures: No test ordered today   Follow-Up: At [facility], you and your health needs are our priority.  As part of our continuing mission to provide you with exceptional heart care, our providers are all part of one team.  This team includes your primary Cardiologist (physician) and Advanced Practice Providers or APPs (Physician Assistants and Nurse Practitioners) who all work together to provide you with the care you need, when you need it.  Your next appointment:   2 month(s) with MD or APP    We recommend signing up for the patient portal called MyChart.  Sign up information is provided on this After Visit Summary.  MyChart is used to connect with patients for Virtual Visits (Telemedicine).  Patients are able to view lab/test results, encounter notes, upcoming appointments, etc.  Non-urgent messages can be sent to your provider as well.   To learn more about what you can do with MyChart, go to [email or web address].

## 2024-03-05 ENCOUNTER — Encounter: Payer: Self-pay | Admitting: Emergency Medicine

## 2024-03-05 ENCOUNTER — Ambulatory Visit: Payer: Self-pay | Admitting: Emergency Medicine

## 2024-03-11 ENCOUNTER — Encounter: Payer: Self-pay | Admitting: Family Medicine

## 2024-03-11 ENCOUNTER — Ambulatory Visit: Admitting: Family Medicine

## 2024-03-11 VITALS — BP 117/78 | HR 69 | Ht 68.0 in | Wt 197.1 lb

## 2024-03-11 DIAGNOSIS — I10 Essential (primary) hypertension: Secondary | ICD-10-CM

## 2024-03-11 DIAGNOSIS — R1031 Right lower quadrant pain: Secondary | ICD-10-CM

## 2024-03-11 DIAGNOSIS — L299 Pruritus, unspecified: Secondary | ICD-10-CM

## 2024-03-11 DIAGNOSIS — B839 Helminthiasis, unspecified: Secondary | ICD-10-CM

## 2024-03-11 DIAGNOSIS — E785 Hyperlipidemia, unspecified: Secondary | ICD-10-CM

## 2024-03-11 DIAGNOSIS — F324 Major depressive disorder, single episode, in partial remission: Secondary | ICD-10-CM

## 2024-03-11 DIAGNOSIS — K921 Melena: Secondary | ICD-10-CM | POA: Diagnosis not present

## 2024-03-11 LAB — POC HEMOCCULT BLD/STL (OFFICE/1-CARD/DIAGNOSTIC): Fecal Occult Blood, POC: NEGATIVE

## 2024-03-11 NOTE — Patient Instructions (Addendum)
 F/u in February as before  Please schedule wellness visit with nurse at [facility] this is past due  Stay on same medications please  Stool is being checked today for hidden blood and this is negative  Peptobismol will make your stool black  Careful not to fall  Thanks for choosing [facility], we consider it a privelige to serve you.

## 2024-03-16 DIAGNOSIS — I10 Essential (primary) hypertension: Secondary | ICD-10-CM | POA: Insufficient documentation

## 2024-03-16 DIAGNOSIS — K921 Melena: Secondary | ICD-10-CM | POA: Insufficient documentation

## 2024-03-16 NOTE — Assessment & Plan Note (Signed)
 Resolved

## 2024-03-16 NOTE — Assessment & Plan Note (Signed)
Stable and managed by Psych 

## 2024-03-16 NOTE — Assessment & Plan Note (Signed)
 Controlled, no change in medication DASH diet and commitment to daily physical activity for a minimum of 30 minutes discussed and encouraged, as a part of hypertension management. The importance of attaining a healthy weight is also discussed.     03/11/2024   10:59 AM 03/11/2024   10:32 AM 03/04/2024    2:54 PM 02/25/2024    2:13 PM 02/25/2024    2:12 PM 02/25/2024    1:31 PM 02/24/2024    7:00 PM  BP/Weight  Systolic BP 117 127 105 160 164 173 170  Diastolic BP 78 73 60 96 100 99 90  Wt. (Lbs)  197.12 193.6   199.04   BMI  29.97 kg/m2 29.44 kg/m2   28.56 kg/m2

## 2024-03-16 NOTE — Assessment & Plan Note (Signed)
 Controlled, no change in medication

## 2024-03-16 NOTE — Progress Notes (Signed)
   Terry Love     MRN: [id number]      DOB: Aug 18, 1935  Chief Complaint  Patient presents with   Medical Management of Chronic Issues    2 week follow up,  [name] stopped his Eliquis  and Lasix  and said that PCP will decide if you wanted to keep him on those or not       HPI Terry Love is here for follow up of hypeetension newly diagnosed , headache and itching He has tolerated and done well with new medications started for those Mainc/o dark stools, had been reporting frequent stools, no infection on testing Cardiology recently stopped both eliquis  and lasix   ROS Denies recent fever or chills. Denies sinus pressure, nasal congestion, ear pain or sore throat. Denies chest congestion, productive cough or wheezing. Denies chest pains, palpitations and leg swelling Denies abdominal pain, nausea, vomiting,diarrhea or constipation.    Chronic  depression, anxiety and  insomnia. Denies skin break down or rash.   PE  BP 117/78   Pulse 69   Ht 5' 8 (1.727 m)   Wt 197 lb 1.9 oz (89.4 kg)   SpO2 95%   BMI 29.97 kg/m   Patient alert and oriented and in no cardiopulmonary distress.  HEENT: No facial asymmetry, EOMI,     Neck supple .  Chest: Clear to auscultation bilaterally.  CVS: S1, S2 no murmurs, no S3.Regular rate.  ABD: Soft non tender. Heme negative stool  Ext: No edema  MS: decreased  ROM spine, shoulders, hips and knees.  Skin: Intact, no ulcerations or rash noted.  Psych: Good eye contact, normal affect. Memory intact not anxious or depressed appearing.  CNS: CN 2-12 intact, power,  normal throughout.no focal deficits noted.   Assessment & Plan  Hypertension Controlled, no change in medication DASH diet and commitment to daily physical activity for a minimum of 30 minutes discussed and encouraged, as a part of hypertension management. The importance of attaining a healthy weight is also discussed.     03/11/2024   10:59 AM 03/11/2024    10:32 AM 03/04/2024    2:54 PM 02/25/2024    2:13 PM 02/25/2024    2:12 PM 02/25/2024    1:31 PM 02/24/2024    7:00 PM  BP/Weight  Systolic BP 117 127 105 160 164 173 170  Diastolic BP 78 73 60 96 100 99 90  Wt. (Lbs)  197.12 193.6   199.04   BMI  29.97 kg/m2 29.44 kg/m2   28.56 kg/m2        Itching Resolved   Black stool Likely related to peptobismol use , heme negative on testing  Hyperlipidemia LDL goal <100 Controlled, no change in medication   Depression, major, single episode, in partial remission Stable and managed by Psych

## 2024-03-16 NOTE — Assessment & Plan Note (Signed)
 Likely related to peptobismol use , heme negative on testing

## 2024-03-17 ENCOUNTER — Ambulatory Visit: Attending: Student | Admitting: Student

## 2024-03-17 ENCOUNTER — Encounter: Payer: Self-pay | Admitting: Student

## 2024-03-17 VITALS — BP 118/80 | HR 59 | Ht 68.0 in | Wt 202.0 lb

## 2024-03-17 DIAGNOSIS — I480 Paroxysmal atrial fibrillation: Secondary | ICD-10-CM | POA: Diagnosis present

## 2024-03-17 DIAGNOSIS — I495 Sick sinus syndrome: Secondary | ICD-10-CM | POA: Diagnosis present

## 2024-03-17 DIAGNOSIS — I5032 Chronic diastolic (congestive) heart failure: Secondary | ICD-10-CM | POA: Diagnosis present

## 2024-03-17 DIAGNOSIS — R195 Other fecal abnormalities: Secondary | ICD-10-CM

## 2024-03-17 NOTE — Progress Notes (Signed)
  Electrophysiology Office Note:   ID:  Terry, Love 1935/05/21, MRN [id number]  Primary Cardiologist: Alvan Carrier, MD Electrophysiologist: Donnice DELENA Primus, MD      History of Present Illness:   Terry Love is a 88 y.o. male with h/o  SND s/p PPM, Chronic diastolic CHF, SND, Obesity, and PVCs seen today for routine electrophysiology followup.   Since last being seen in our clinic the patient reports doing OK. He has had no further dark stools. Confounded by stopping Eliquis  and pepto-bismol both. Heme negative testing of his stool and stable CBC, so unclear if was related to Eliquis . Currently, he denies chest pain, palpitations, dyspnea, PND, orthopnea, nausea, vomiting, dizziness, syncope, edema, weight gain, or early satiety.   Review of systems complete and found to be negative unless listed in HPI.   EP Information / Studies Reviewed:    EKG is not ordered today. EKG from 02/14/2024 reviewed which showed AP/VS rhythm with 1st degree and PVCs at 71 bpm       PPM Interrogation- Brief check only today with no fib. No testing performed.  Arrhythmia/Device History BIOTRONIK PPM   Physical Exam:   VS:  Ht 5' 8 (1.727 m)   BMI 29.97 kg/m    Wt Readings from Last 3 Encounters:  03/11/24 197 lb 1.9 oz (89.4 kg)  03/04/24 193 lb 9.6 oz (87.8 kg)  02/25/24 199 lb 0.6 oz (90.3 kg)     GEN: No acute distress  NECK: No JVD; No carotid bruits CARDIAC: Regular rate and rhythm, no murmurs, rubs, gallops RESPIRATORY:  Clear to auscultation without rales, wheezing or rhonchi  ABDOMEN: Soft, non-tender, non-distended EXTREMITIES:  No edema; No deformity   ASSESSMENT AND PLAN:    SND s/p Biotronik PPM  Normal PPM function by last check and remotes No testing or changes today  Paroxysmal AF Singular short episode.  No further by brief device check. Since starting, has had black stools and eliquis  paused. Heme negative by home testing.   Discussed options at  length. Re-trial Eliquis  given possibility of pepto-bismol induced dark stools with heme negative.  Continue watchful waiting given no recurrent AF, and initial episode being very short. Will re-trial eliquis  if has recurrnce on his pacemaker.  Pt and wife wish to proceed with option 2. Will continue watchful waiting via his device OFF OAC for now.   Black stools Heme negative by home testing.  Felt possible due to pepto-bismol use per PCP.  Chronic diastolic CHF Volume status stable.   Disposition:   Follow up with EP Team in 6 months  Signed, Terry Prentice Passey, PA-C

## 2024-03-17 NOTE — Patient Instructions (Signed)
 Medication Instructions:  No medication changes today. *If you need a refill on your cardiac medications before your next appointment, please call your pharmacy*  Lab Work: No labwork ordered today. If you have labs (blood work) drawn today and your tests are completely normal, you will receive your results only by: MyChart Message (if you have MyChart) OR A paper copy in the mail If you have any lab test that is abnormal or we need to change your treatment, we will call you to review the results.  Testing/Procedures: No testing ordered today  Follow-Up: At [facility], you and your health needs are our priority.  As part of our continuing mission to provide you with exceptional heart care, our providers are all part of one team.  This team includes your primary Cardiologist (physician) and Advanced Practice Providers or APPs (Physician Assistants and Nurse Practitioners) who all work together to provide you with the care you need, when you need it.  Your next appointment:   6 month(s)  Provider:   You may see Donnice DELENA Primus, MD or one of the following Advanced Practice Providers on your designated Care Team:   Charlies Arthur, PA-C Kiing Deakin Andy Alexios Keown, PA-C Suzann Riddle, NP Daphne Barrack, NP    We recommend signing up for the patient portal called MyChart.  Sign up information is provided on this After Visit Summary.  MyChart is used to connect with patients for Virtual Visits (Telemedicine).  Patients are able to view lab/test results, encounter notes, upcoming appointments, etc.  Non-urgent messages can be sent to your provider as well.   To learn more about what you can do with MyChart, go to [email or web address].

## 2024-03-18 ENCOUNTER — Other Ambulatory Visit: Payer: Self-pay | Admitting: Family Medicine

## 2024-04-03 ENCOUNTER — Ambulatory Visit: Payer: Medicare Other

## 2024-04-03 DIAGNOSIS — I5032 Chronic diastolic (congestive) heart failure: Secondary | ICD-10-CM

## 2024-04-05 ENCOUNTER — Ambulatory Visit: Payer: Self-pay | Admitting: Student in an Organized Health Care Education/Training Program

## 2024-04-05 LAB — CUP PACEART REMOTE DEVICE CHECK
Date Time Interrogation Session: 20251226173120
Implantable Lead Connection Status: 753985
Implantable Lead Connection Status: 753985
Implantable Lead Implant Date: 20191216
Implantable Lead Implant Date: 20191216
Implantable Lead Location: 753859
Implantable Lead Location: 753860
Implantable Lead Model: 377
Implantable Lead Model: 377
Implantable Lead Serial Number: 80840322
Implantable Lead Serial Number: 80924555
Implantable Pulse Generator Implant Date: 20191216
Pulse Gen Model: 407145
Pulse Gen Serial Number: 69486427

## 2024-04-06 NOTE — Progress Notes (Signed)
 Remote PPM Transmission

## 2024-04-08 ENCOUNTER — Other Ambulatory Visit: Payer: Self-pay | Admitting: Family Medicine

## 2024-04-08 MED ORDER — BUDESONIDE-FORMOTEROL FUMARATE 160-4.5 MCG/ACT IN AERO: INHALATION_SPRAY | RESPIRATORY_TRACT | 6 refills | Status: AC

## 2024-04-08 NOTE — Telephone Encounter (Signed)
 Copied from CRM #[id number]. Topic: Clinical - Medication Refill >> Apr 08, 2024 10:24 AM Darshell M wrote: Medication: budesonide -formoterol  (SYMBICORT ) 160-4.5 MCG/ACT inhaler  Has the patient contacted their pharmacy? Yes (Agent: If no, request that the patient contact the pharmacy for the refill. If patient does not wish to contact the pharmacy document the reason why and proceed with request.) (Agent: If yes, when and what did the pharmacy advise?)  This is the patient's preferred pharmacy:  [id number] - [place],  - [address] AT [facility] [address] [place] [address] Phone: [phone] Fax: [phone]  Is this the correct pharmacy for this prescription? Yes If no, delete pharmacy and type the correct one.   Has the prescription been filled recently? No  Is the patient out of the medication? Yes  Has the patient been seen for an appointment in the last year OR does the patient have an upcoming appointment? Yes  Can we respond through MyChart? No  Agent: Please be advised that Rx refills may take up to 3 business days. We ask that you follow-up with your pharmacy.

## 2024-04-28 ENCOUNTER — Ambulatory Visit

## 2024-05-08 ENCOUNTER — Ambulatory Visit

## 2024-05-08 VITALS — Ht 68.0 in | Wt 202.0 lb

## 2024-05-08 DIAGNOSIS — Z0000 Encounter for general adult medical examination without abnormal findings: Secondary | ICD-10-CM

## 2024-05-08 NOTE — Progress Notes (Signed)
 "  Chief Complaint  Patient presents with   Medicare Wellness     Subjective:   Terry Love is a 89 y.o. male who presents for a Medicare Annual Wellness Visit.  Visit info / Clinical Intake: Medicare Wellness Visit Type:: Subsequent Annual Wellness Visit Persons participating in visit and providing information:: caregiver (with patient present) (patient unable to hear on the telephone so patient's wife assisted with visit.) Medicare Wellness Visit Mode:: Telephone If telephone:: video declined Since this visit was completed virtually, some vitals may be partially provided or unavailable. Missing vitals are due to the limitations of the virtual format.: Documented vitals are patient reported If Telephone or Video please confirm:: I connected with patient using audio/video enable telemedicine. I verified patient identity with two identifiers, discussed telehealth limitations, and patient agreed to proceed. Patient Location:: home Provider Location:: home office Interpreter Needed?: No Pre-visit prep was completed: yes AWV questionnaire completed by patient prior to visit?: no Living arrangements:: lives with spouse/significant other Patient's Overall Health Status Rating: good Typical amount of pain: none Does pain affect daily life?: no Are you currently prescribed opioids?: no  Dietary Habits and Nutritional Risks How many meals a day?: 3 Eats fruit and vegetables daily?: yes Most meals are obtained by: having others provide food In the last 2 weeks, have you had any of the following?: (!) nausea, vomiting, diarrhea Diabetic:: no  Functional Status Activities of Daily Living (to include ambulation/medication): Independent Ambulation: Independent with device- listed below Home Assistive Devices/Equipment: Walker (specify Type) Medication Administration: Independent Home Management (perform basic housework or laundry): Independent Manage your own finances?: yes Primary  transportation is: driving Concerns about vision?: no *vision screening is required for WTM* Concerns about hearing?: no  Fall Screening Falls in the past year?: 1 Number of falls in past year: 1 Was there an injury with Fall?: 1 Fall Risk Category Calculator: 3 Patient Fall Risk Level: High Fall Risk  Fall Risk Patient at Risk for Falls Due to: History of fall(s); Impaired balance/gait; Impaired mobility Fall risk Follow up: Falls evaluation completed; Education provided; Falls prevention discussed  Home and Transportation Safety: All rugs have non-skid backing?: yes All stairs or steps have railings?: yes Grab bars in the bathtub or shower?: yes Have non-skid surface in bathtub or shower?: yes Good home lighting?: yes Regular seat belt use?: yes Hospital stays in the last year:: no  Cognitive Assessment Difficulty concentrating, remembering, or making decisions? : no Will 6CIT or Mini Cog be Completed: yes What year is it?: 0 points What month is it?: 0 points Give patient an address phrase to remember (5 components): [address] [place] About what time is it?: 0 points Count backwards from 20 to 1: 0 points Say the months of the year in reverse: 0 points Repeat the address phrase from earlier: 0 points 6 CIT Score: 0 points  Advance Directives (For Healthcare) Does Patient Have a Medical Advance Directive?: No Type of Advance Directive: Healthcare Power of Attorney Copy of Healthcare Power of Attorney in Chart?: No - copy requested Would patient like information on creating a medical advance directive?: Yes (MAU/Ambulatory/Procedural Areas - Information given)  Reviewed/Updated  Reviewed/Updated: Reviewed All (Medical, Surgical, Family, Medications, Allergies, Care Teams, Patient Goals)    Allergies (verified) Patient has no known allergies.   Current Medications (verified) Outpatient Encounter Medications as of 05/08/2024  Medication Sig   albuterol   (VENTOLIN  HFA) 108 (90 Base) MCG/ACT inhaler INHALE 2 PUFFS INTO THE LUNGS EVERY  4 HOURS AS NEEDED FOR WHEEZING OR SHORTNESS OF BREATH.   amLODipine  (NORVASC ) 2.5 MG tablet TAKE 1 TABLET BY MOUTH EVERY DAY   atorvastatin  (LIPITOR) 40 MG tablet Take 1 tablet (40 mg total) by mouth at bedtime.   budesonide -formoterol  (SYMBICORT ) 160-4.5 MCG/ACT inhaler Take 2 puffs first thing in am and then another 2 puffs about 12 hours later.   calcium -vitamin D  (OSCAL WITH D) 500-200 MG-UNIT tablet Take 1 tablet by mouth 2 (two) times daily.   carboxymethylcellulose (REFRESH PLUS) 0.5 % SOLN Place 1 drop into both eyes 2 (two) times daily.   Cetirizine HCl 10 MG CAPS Take 10 mg by mouth daily.   dorzolamide-timolol (COSOPT) 22.3-6.8 MG/ML ophthalmic solution Place 1 drop into both eyes 2 (two) times daily.   doxycycline  (VIBRAMYCIN ) 100 MG capsule Take 1 capsule (100 mg total) by mouth 2 (two) times daily.   famotidine  (PEPCID ) 20 MG tablet TAKE 1 TABLET BY MOUTH EVERY DAY AFTER DINNER   fluticasone (FLONASE) 50 MCG/ACT nasal spray Place 1 spray into both nostrils daily.   furosemide  (LASIX ) 20 MG tablet Take 1 tablet (20 mg total) by mouth daily as needed (Weight gain, Leg swelling, and shortness of breath).   hydrOXYzine  (ATARAX ) 10 MG tablet TAKE ONE TABLET BY MOUTH TWO TIMES DAILY AS NEEDED, FOR ITCHING   latanoprost (XALATAN) 0.005 % ophthalmic solution Place 1 drop into both eyes at bedtime.   montelukast  (SINGULAIR ) 10 MG tablet TAKE 1 TABLET BY MOUTH EVERYDAY AT BEDTIME   Multiple Vitamin (MULTIVITAMIN WITH MINERALS) TABS tablet Take 1 tablet by mouth at bedtime.   omeprazole  (PRILOSEC) 40 MG capsule TAKE 1 CAPSULE (40 MG TOTAL) BY MOUTH DAILY.   tamsulosin  (FLOMAX ) 0.4 MG CAPS capsule Take 1 capsule (0.4 mg total) by mouth daily.   venlafaxine  XR (EFFEXOR -XR) 150 MG 24 hr capsule Take 1 capsule by mouth every morning.   vitamin C (ASCORBIC ACID) 500 MG tablet Take 500 mg by mouth at bedtime.    Vitamin  D, Ergocalciferol , (DRISDOL ) 1.25 MG (50000 UNIT) CAPS capsule TAKE 1 CAPSULE (50,000 UNITS TOTAL) BY MOUTH EVERY MONDAY. IN THE MORNING   No facility-administered encounter medications on file as of 05/08/2024.    History: Past Medical History:  Diagnosis Date   (HFpEF) heart failure with preserved ejection fraction (HCC)    a. 06/2015 Echo: EF 60-65%, GrI DD; b. 02/2018 Echo: EF 60-65%, mild-mod LVH, GrI DD; c. 02/2023 Echo: EF 55-60%, no rwma, mod LVH.   Anxiety    Aortic insufficiency (mild)    a. 06/2015 Echo: Mild AI; b. 02/2018 Echo: Mild AI; c. 02/2023 Echo: EF 55-60%, no rwma, mod LVH, nl RV fxn, mild AI.   Asthma    Chest pain    a. 06/2015 MV: No isch/scar. EF 70%-->low risk;  b. 02/2018 ETT: Nondiagnostic, 57% MPHR - + for chronotropic incompetence.   Colon polyps    Depression    Facial tic    GERD (gastroesophageal reflux disease)    Glaucoma    Hearing loss    Hyperlipidemia    Insomnia secondary to depression with anxiety 03/2015   Obesity    Seasonal allergies    Symptomatic Bradycardia    a. 03/2018 s/p Biotronik DC PPM (ser # [id number]).   Past Surgical History:  Procedure Laterality Date   carpal tunnel  release right  2006   CATARACT EXTRACTION, BILATERAL     COLONOSCOPY     COLONOSCOPY  01/25/2011   Dr. Rehman:Examination  performed to cecum Pan colonic diverticulosis/2 small polyps ablated via cold biopsy from transverse colon/External hemorrhoids, benign polyps   COLONOSCOPY N/A 06/29/2013   Procedure: COLONOSCOPY;  Surgeon: Lamar CHRISTELLA Hollingshead, MD;  Location: [facility];  Service: Endoscopy;  Laterality: N/A;  10:45   EYE SURGERY     left eye cataracts    PACEMAKER IMPLANT N/A 03/24/2018   Procedure: PACEMAKER IMPLANT;  Surgeon: Waddell Danelle ORN, MD;  Location: [facility];  Service: Cardiovascular;  Laterality: N/A;   Family History  Problem Relation Age of Onset   Colon cancer Mother        diagnosed at age 40   Aneurysm Father    Aneurysm  Brother    Stroke Brother    Social History   Occupational History   Occupation: retired    Comment: [employer];  Tobacco Use   Smoking status: Never   Smokeless tobacco: Former    Types: Chew    Quit date: 03/13/2016  Vaping Use   Vaping status: Never Used  Substance and Sexual Activity   Alcohol use: No    Alcohol/week: 0.0 standard drinks of alcohol   Drug use: No   Sexual activity: Not Currently   Tobacco Counseling Counseling given: Yes  SDOH Screenings   Food Insecurity: No Food Insecurity (05/08/2024)  Housing: Low Risk (05/08/2024)  Transportation Needs: No Transportation Needs (05/08/2024)  Utilities: Not At Risk (05/08/2024)  Alcohol Screen: Low Risk (03/19/2022)  Depression (PHQ2-9): Medium Risk (05/08/2024)  Financial Resource Strain: Low Risk (03/19/2022)  Physical Activity: Insufficiently Active (05/08/2024)  Social Connections: Moderately Integrated (05/08/2024)  Stress: No Stress Concern Present (05/08/2024)  Tobacco Use: Medium Risk (05/08/2024)  Health Literacy: Adequate Health Literacy (05/08/2024)   See flowsheets for full screening details  Depression Screen PHQ 2 & 9 Depression Scale- Over the past 2 weeks, how often have you been bothered by any of the following problems? Little interest or pleasure in doing things: 2 Feeling down, depressed, or hopeless (PHQ Adolescent also includes...irritable): 2 PHQ-2 Total Score: 4 Trouble falling or staying asleep, or sleeping too much: 2 Feeling tired or having little energy: 1 Poor appetite or overeating (PHQ Adolescent also includes...weight loss): 0 Feeling bad about yourself - or that you are a failure or have let yourself or your family down: 0 Trouble concentrating on things, such as reading the newspaper or watching television (PHQ Adolescent also includes...like school work): 0 Moving or speaking so slowly that other people could have noticed. Or the opposite - being so fidgety or restless that you  have been moving around a lot more than usual: 0 Thoughts that you would be better off dead, or of hurting yourself in some way: 0 PHQ-9 Total Score: 7 If you checked off any problems, how difficult have these problems made it for you to do your work, take care of things at home, or get along with other people?: Not difficult at all  Depression Treatment Depression Interventions/Treatment : Currently on Treatment     Goals Addressed               This Visit's Progress     stay healthy and improve balance to be able to walk better (pt-stated)               Objective:    Today's Vitals   05/08/24 1104  Weight: 202 lb (91.6 kg)  Height: 5' 8 (1.727 m)   Body mass index is 30.71 kg/m.  Hearing/Vision screen Hearing Screening - Comments:: Patient has lost hearing completely in RT ear and only has about 60% in LT ear Vision Screening - Comments:: Patient is up to date on yearly exams  Immunizations and Health Maintenance Health Maintenance  Topic Date Due   COVID-19 Vaccine (10 - 2025-26 season) 12/09/2023   Medicare Annual Wellness (AWV)  05/08/2025   DTaP/Tdap/Td (7 - Td or Tdap) 09/19/2033   Pneumococcal Vaccine: 50+ Years  Completed   Influenza Vaccine  Completed   Zoster Vaccines- Shingrix  Completed   Meningococcal B Vaccine  Aged Out        Assessment/Plan:  This is a routine wellness examination for Terry Love.  Patient Care Team: Antonetta Rollene BRAVO, MD as PCP - General (Family Medicine) Shaaron Lamar HERO, MD as Consulting Physician (Gastroenterology) Darlean Ozell NOVAK, MD as Consulting Physician (Pulmonary Disease) Alvan Dorn FALCON, MD as Consulting Physician (Cardiology) Almetta Donnice LABOR, MD as Consulting Physician (Cardiology)  I have personally reviewed and noted the following in the patients chart:   Medical and social history Use of alcohol, tobacco or illicit drugs  Current medications and supplements including opioid prescriptions. Functional  ability and status Nutritional status Physical activity Advanced directives List of other physicians Hospitalizations, surgeries, and ER visits in previous 12 months Vitals Screenings to include cognitive, depression, and falls Referrals and appointments  No orders of the defined types were placed in this encounter.  In addition, I have reviewed and discussed with patient certain preventive protocols, quality metrics, and best practice recommendations. A written personalized care plan for preventive services as well as general preventive health recommendations were provided to patient.   Ambrie Carte, CMA   05/08/2024   Return May 11, 2025 at 10:40 am, for In office Medicare Well Visit w  Wellness Nurse.  After Visit Summary: (MyChart) Due to this being a telephonic visit, the after visit summary with patients personalized plan was offered to patient via MyChart   "

## 2024-05-08 NOTE — Patient Instructions (Signed)
 Terry Love,  Thank you for taking the time for your Medicare Wellness Visit. I appreciate your continued commitment to your health goals. Please review the care plan we discussed, and feel free to reach out if I can assist you further.  Please note that Annual Wellness Visits do not include a physical exam. Some assessments may be limited, especially if the visit was conducted virtually. If needed, we may recommend an in-person follow-up with your provider.  Ongoing Care Seeing your primary care provider every 3 to 6 months helps us  monitor your health and provide consistent, personalized care.   Referrals If a referral was made during today's visit and you haven't received any updates within two weeks, please contact the referred provider directly to check on the status.  Recommended Screenings:  Health Maintenance  Topic Date Due   Medicare Annual Wellness Visit  03/20/2023   COVID-19 Vaccine (10 - 2025-26 season) 12/09/2023   DTaP/Tdap/Td vaccine (7 - Td or Tdap) 09/19/2033   Pneumococcal Vaccine for age over 61  Completed   Flu Shot  Completed   Zoster (Shingles) Vaccine  Completed   Meningitis B Vaccine  Aged Out       05/08/2024   11:12 AM  Advanced Directives  Does Patient Have a Medical Advance Directive? No  Would patient like information on creating a medical advance directive? Yes (MAU/Ambulatory/Procedural Areas - Information given)    Vision: Annual vision screenings are recommended for early detection of glaucoma, cataracts, and diabetic retinopathy. These exams can also reveal signs of chronic conditions such as diabetes and high blood pressure.  Dental: Annual dental screenings help detect early signs of oral cancer, gum disease, and other conditions linked to overall health, including heart disease and diabetes.  Please see the attached documents for additional preventive care recommendations.

## 2024-05-14 ENCOUNTER — Ambulatory Visit: Admitting: Family Medicine

## 2024-07-02 ENCOUNTER — Ambulatory Visit: Admitting: Family Medicine

## 2024-10-30 ENCOUNTER — Ambulatory Visit: Admitting: Urology

## 2025-05-11 ENCOUNTER — Ambulatory Visit: Payer: Self-pay
